# Patient Record
Sex: Male | Born: 1950 | Race: Black or African American | Hispanic: No | Smoking: Current every day smoker
Health system: Southern US, Community
[De-identification: ages and names within clinical notes are randomized; demographics above are authoritative.]

## PROBLEM LIST (undated history)

## (undated) DIAGNOSIS — F1010 Alcohol abuse, uncomplicated: Secondary | ICD-10-CM

## (undated) DIAGNOSIS — I10 Essential (primary) hypertension: Secondary | ICD-10-CM

## (undated) DIAGNOSIS — Z9119 Patient's noncompliance with other medical treatment and regimen: Secondary | ICD-10-CM

## (undated) DIAGNOSIS — K922 Gastrointestinal hemorrhage, unspecified: Secondary | ICD-10-CM

## (undated) DIAGNOSIS — D649 Anemia, unspecified: Secondary | ICD-10-CM

## (undated) DIAGNOSIS — E119 Type 2 diabetes mellitus without complications: Secondary | ICD-10-CM

## (undated) DIAGNOSIS — Z91199 Patient's noncompliance with other medical treatment and regimen due to unspecified reason: Secondary | ICD-10-CM

## (undated) DIAGNOSIS — R7302 Impaired glucose tolerance (oral): Secondary | ICD-10-CM

## (undated) DIAGNOSIS — K3189 Other diseases of stomach and duodenum: Secondary | ICD-10-CM

## (undated) DIAGNOSIS — E785 Hyperlipidemia, unspecified: Secondary | ICD-10-CM

## (undated) DIAGNOSIS — K269 Duodenal ulcer, unspecified as acute or chronic, without hemorrhage or perforation: Secondary | ICD-10-CM

## (undated) HISTORY — PX: HERNIA REPAIR: SHX51

## (undated) HISTORY — PX: CARDIAC CATHETERIZATION: SHX172

---

## 2006-07-17 ENCOUNTER — Inpatient Hospital Stay (HOSPITAL_COMMUNITY): Admission: EM | Admit: 2006-07-17 | Discharge: 2006-07-21 | Payer: Self-pay | Admitting: Emergency Medicine

## 2008-06-14 ENCOUNTER — Emergency Department (HOSPITAL_COMMUNITY): Admission: EM | Admit: 2008-06-14 | Discharge: 2008-06-14 | Payer: Self-pay | Admitting: Emergency Medicine

## 2010-05-29 LAB — URINALYSIS, ROUTINE W REFLEX MICROSCOPIC
Hgb urine dipstick: NEGATIVE
Leukocytes, UA: NEGATIVE
Protein, ur: 30 mg/dL — AB
Urobilinogen, UA: 0.2 mg/dL (ref 0.0–1.0)

## 2010-05-29 LAB — URINE MICROSCOPIC-ADD ON

## 2010-07-02 NOTE — Discharge Summary (Signed)
NAME:  Darren Howard, Darren Howard NO.:  [id number]   MEDICAL RECORD NO.:  [id number]          PATIENT TYPE:  INP   LOCATION:  2006                         FACILITY:  [facility]   PHYSICIAN:  Darren Howard, M.D.DATE OF BIRTH:  06/16/1950   DATE OF ADMISSION:  07/17/2006  DATE OF DISCHARGE:  07/21/2006                               DISCHARGE SUMMARY   DISCHARGE DIAGNOSES:  1. Hypertensive emergency, resolved and controlled.  2. Chest tightness.      a.     Negative myocardial infarction.  Patent coronary arteries       with normal ejection fraction 60-70%.  3. Diabetes mellitus type 2, new diagnosis.  4. Tobacco abuse, to stop smoking.  5. Hypokalemia, resolved.  6. Hyperlipidemia.   DISCHARGE CONDITION:  Improved.   PROCEDURES:  Combined left heart cath by Dr. Bryson Howard, July 20, 2006, revealing normal cores, LV is normal, renal arteries were normal.   DISCHARGE MEDICATIONS:  1. Norvasc 10 mg daily.  2. Aspirin 81 mg daily.  3. Lasix 40 mg daily.  4. Metoprolol XL, i.e. Toprol, 100 mg daily.  5. Zocor 40 mg every evening.  6. Chantix 0.5 mg 1 tab twice a day for 3 days, then 2 twice a day.      Patient may not feel he prefers to stop suddenly.  7. K-Dur 10 mEq daily.  8. Do Accu-Checks before meals and at bedtime.  Keep record for Dr.      Caron Howard.  9. 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   DISCHARGE INSTRUCTIONS:  1. Increase activity slowly.  May walk up steps once or twice a day      for 2 days.  2. May shower, bath.  3. No lifting for 2 days.  4. No driving for 2 days.  5. A low-sodium, heart-healthy diabetic diet.  6. Wash right groin cath site with soap and water.  Call if any      bleeding, swelling or drainage.  7. Follow up with Dr. Melvern Howard in [place] in 2 weeks.  Office will      call with date and time.  8. See Dr. Caron Howard this week, versus next, for diabetes management.  Take with you a log of all of your Accu-Checks.   HISTORY OF PRESENT ILLNESS:  [age]-year-old African American male  who presented to the emergency room for evaluation secondary to  dizziness, weakness, mild headache, chest tightness, chest pressure and  abnormal EKG.  He initially had problems on Jul 16, 2006, at work of  dizziness more pronounced than usual, also some mild headaches.  Blood  pressure was checked, at work, it was 204/100.  The morning of  admission, he felt weak, swimmy headed and decided to go to the doctor's  office.  Blood pressure, at Dr. Caron Howard, was 210/120.  EKG revealed  abnormal T wave inversions in his anterolateral leads.  Because of this,  patient was sent to the emergency room at [facility] for cardiac  evaluation.   PAST MEDICAL HISTORY:  Denies  any medical history, except surgical  repair of right inguinal hernia.   FAMILY HISTORY:  Significant for coronary disease.   SOCIAL HISTORY:  Smokes and drinks alcohol daily, even more so on the  weekends.  He lives with his wife, 4 children and 1 grandchild.   ALLERGIES:  NO KNOWN ALLERGIES.   OUTPATIENT MEDS:  None.   REVIEW OF SYSTEMS:  See H&P.   PHYSICAL EXAMINATION AT DISCHARGE:  VITAL SIGNS:  Blood pressure 142/89-  133/86.  Pulse 66.  Respiratory rate is 18.  Temperature 96.8.  Oxygen  saturation, on room air, 99%.  HEART:  Regular rate and rhythm.  S1, S2.  LUNGS:  Clear.  ABDOMEN:  Positive bowel sounds.  Right groin cath site was stable.  No  hematoma.  LOWER EXTREMITIES:  No edema of his lower extremities and pedal pulses  are present.  NEURO:  Alert and oriented.   LABORATORY DATA:  Initial hemoglobin 13.9, hematocrit 41.1, WBC was low  at 2.6, platelets 194.  At time of discharge, hemoglobin 14.2,  hematocrit 42.4, WBC is normal at 4.3 and platelets are 203.  Neutrophils, on admission, were 49, lymph 35, mono 12, EO 3, base at 1.   Coags:  PT 13.3, INR of 1.0, PTT 42 and on  heparin he was therapeutic.   Chemistries:  Sodium 139, potassium 3.8, chloride 106, CO2 27, glucose  113, BUN 13, creatinine 0.93, glucose remained elevation throughout  hospitalization.  Total protein 6.4, albumin 3.4, AST 29, ALT 34, ALP  52, total bili 0.6, mag level was 1.9-2.2.  Glycohemoglobin was 6.2.   Cardiac enzymes:  CK 207, 216, 156.  MBs were negative 3.3-2.3.  Troponin-I 0.01-0.02.   Cholesterol 181, triglycerides 138, HDL 38 and LDL 115.   PSA was 2.18.  TSH ranged 0.780-1.973.  UA was clear.  Chest x-ray  suggests a mild pulmonary vascular congestion.  No frank heart failure.  Low lung volumes.   HOSPITAL COURSE:  Darren Howard was admitted Jul 17, 2006 secondary to  hypertensive emergency with blood pressure 200/120, was sent to [place] from  Dr. Caron Howard.  Three years prior to this was his last eval and his blood  pressure, at that time, was normal.  Patient was seen and examined by  Darren Howard and initially started on Norvasc, labetalol, Lasix and due to  his family history, which his father died at 69 with an MI, also his son  who is 12 has diabetes, and the patient himself is now diabetic with a  glycohemoglobin of 6.2, it was felt cardiac cath would be the best  choice of evaluation.  Cardiac cath was found, fortunately, to have  normal cores, normal EF, just needed to be monitored for his  hypertension.  He was instructed on tobacco cessation, to decrease his  alcohol intake and to eat a health diet, diabetic diet and we also  instructed him on Accu-Checks.   Prior to his discharge, he had 4 beats of nonsustained ventricular  tachycardia with normal cords and normal LV.  This was felt secondary to  subendocardial ischemia from his hypertension and his medications were  adjusted.   Patient will follow up with Dr. Melvern Howard in [place], as well as Dr. Caron Howard for his diabetes management.  We will have him do Accu-Checks  prior to seeing Dr. Caron Howard.  Hopefully  with diet and exercise, we may  actually control without adding medication, but I will leave that to Dr.  Caron Howard.  Otilio Carpen. Dorene Ar, N.P.    ______________________________  Darren Howard, M.D.    LRI/MEDQ  D:  07/21/2006  T:  07/21/2006  Job:  [id number]   cc:   Bonne Dolores, M.D.

## 2010-07-02 NOTE — Cardiovascular Report (Signed)
NAME:  Darren Howard, Darren Howard NO.:  [id number]   MEDICAL RECORD NO.:  [id number]          PATIENT TYPE:  INP   LOCATION:  2006                         FACILITY:  [facility]   PHYSICIAN:  Bryson Dames, M.D.DATE OF BIRTH:  1950/10/05   DATE OF PROCEDURE:  07/20/2006  DATE OF DISCHARGE:                            CARDIAC CATHETERIZATION   PROCEDURES PERFORMED:  1. Selective coronary angiography by Judkins technique.  2. Retrograde left heart catheterization.  3. Left ventricular angiography.  4. Suprarenal abdominal aortography with bifemoral runoff.   COMPLICATIONS:  None.   ENTRY SITE:  Right femoral.   DYE USED:  Omnipaque.   CATHETERS USED:  Six-French Cordis diagnostic catheters.   PATIENT PROFILE:  The patient is a very pleasant 60 year old gentleman  who was admitted to the hospital for a blood pressure of 210/100, with a  recent emergency room evaluation for dizziness and lightheadedness.  His  blood pressure actually went as high as 210/120.  Abnormal EKG changes  were noted including T-wave inversions in the anterolateral leads.  He  was hospitalized and had negative cardiac enzymes and now enters the  cath lab for further evaluation of his borderline diabetes,  hypertension, abnormal EKG, and positive family history of premature  cardiovascular death, i.e., his father died at age 44 with acute  myocardial infarction.   RESULTS:   PRESSURES:  Left ventricular pressure was [id number], end-diastolic pressure  40.  Central aortic pressure 205/100, mean of 142.  No aortic valve gradient by pullback technique.   ANGIOGRAPHIC RESULTS:  The patient's left main coronary artery was  normal as was his LAD, two diagonal branches, and circumflex with one  large obtuse marginal branch.  No lesions were seen throughout his  entire left coronary arterial system.   The right coronary artery was a codominant vessel with the circumflex.  It gave rise to both the  posterior descending and a posterolateral  branch.  There was a lot of tortuosity in the RCA but no stenoses.   LEFT VENTRICULAR ANGIOGRAPHY:  Showed vigorous contractility of all wall  segments.  There did appear to be concentric left ventricular  hypertrophy.  There was no mitral valve prolapse.  There was a trivial  amount of catheter-induced mitral regurgitation which was not clinically  significant.  The aortic root looked normal and the ejection fraction  estimate was 60-70%.   SUPRARENAL ABDOMINAL AORTOGRAPHY:  Showed bilateral smooth renal  arteries with no evidence of stenosis, a normal infrarenal abdominal  aorta, normal common iliacs, and internal and external iliacs looked  fairly good, although they were not completely well visualized.   FINAL IMPRESSIONS:  1. Severe hypertension with secondary effects including left      ventricular hypertrophy on EGG and on left ventricular angiogram.  2. Recently developed borderline diabetes.  3. Multiple other cardiac risk factors including family history.  4. Normal renal arteries.  5. Normal coronary arteries.  6. Normal left ventricular systolic function.   PLAN:  The patient will follow up with Dr. Bonne Dolores in [place],  and he will follow up in our [facility]  office in May.           ______________________________  Bryson Dames, M.D.     WHG/MEDQ  D:  07/20/2006  T:  07/20/2006  Job:  [id number]   cc:   Bonne Dolores, M.D.

## 2010-07-02 NOTE — Discharge Summary (Signed)
NAME:  Darren Howard, Darren Howard NO.:  [id number]   MEDICAL RECORD NO.:  [id number]          PATIENT TYPE:  INP   LOCATION:  2006                         FACILITY:  [facility]   PHYSICIAN:  York Grice, P.A. DATE OF BIRTH:  1950/09/04   DATE OF ADMISSION:  07/17/2006  DATE OF DISCHARGE:                               DISCHARGE SUMMARY   CHIEF COMPLAINT:  Dizziness, weakness, mild headache, chest tightness,  mild chest pressure, abnormal EKG.   HISTORY OF PRESENT ILLNESS:  This is a 60 year old African American  gentleman who has never been seen by a cardiologist before, who  presented now to the emergency room for evaluation of her referral of  Dr. Caron Presume.  He does not have any previous history of coronary artery  disease, and considers himself quite healthy, but last night at work he  experienced dizziness that was more pronounced than usual and also had  some mild headache.  Blood pressure was checked at work, and it was  204/100.  This morning, the patient felt weak, swimmy-headed, and  decided to go to the doctor's office to have an evaluation.  Blood  pressure in the physician's office remained elevated to 210/120, and his  EKG revealed abnormal changes, T-wave inversion in anterolateral leads.  Based on these findings, the patient was sent to the emergency room of  [facility] for cardiology evaluation.   PAST MEDICAL HISTORY:  He denied any illnesses except surgical repair of  right inguinal hernia.   FAMILY HISTORY:  Significant for coronary artery disease.   SOCIAL HISTORY:  He continues to smoke daily, and drinks alcohol,  especially on the weekends.  He lives with his wife, has 4 children, and  1 grandchild.   ALLERGIES:  No known allergies.   MEDICATIONS:  None.   REVIEW OF SYSTEMS:  The patient denied any night sweats, chills, or any  changes in his weight recently.  He complains of mild headache.  There  is no dyspnea, no real chest pain,  but he admits to having mild chest  pressure.  He also feels occasional swimmy-headed, and when it happens,  he has some visual changes, but they are very short-lived.  He denied  any palpitations, syncope, presyncope.  There is no lower extremity  edema or claudication.  No indigestion, nausea, vomiting, no hematuria  or dysuria.  All other review of systems is negative.   PHYSICAL EXAMINATION:  GENERAL:  Well-developed, well-nourished African  American gentleman in no acute distress.  VITAL SIGNS:  Blood pressure 210/100.  Heart rate 63.  Temperature 98.1.  Respirations 18.  O2 saturation 100% on room air.  HEENT:  Normocephalic, atraumatic.  Extraocular movements are intact.  Sclerae anicteric.  NECK:  Without any JVD or carotid bruit.  Supple.  No lymphadenopathy or  thyromegaly appreciated.  LUNGS:  Clear to auscultation bilaterally.  No adventitious sounds.  HEART:  Regular rate and rhythm.  There is a 1 to 2/6 systolic ejection  murmur, which could be heard along the left sternal border as well.  ABDOMEN:  Soft, nontender, nondistended, with positive  bowel sounds x4.  No organomegaly or masses appreciated.  EXTREMITIES:  No edema.  Intact peripheral pulses.   EKG reveals sinus rhythm, T-wave inversions in leads I, aVL, V5 and V6.  His laboratory data showed leukopenia.  White blood cell count 2.6,  hemoglobin 13.9, hematocrit 41.1, and platelet count 194.  Sodium 139,  potassium 3.8, chloride 106, CO2 27, BUN 13, creatinine 0.93, glucose  119.  His AST was 29, ALT 34, CK-MB 1.3, troponin 0.05, and myoglobin  74.8.   IMPRESSION:  1. Abnormal electrocardiogram.  2. Hypertensive urgency.  3. Unknown lipid profile.  4. Family history of coronary artery disease.  5. Ongoing tobacco use.   PLAN:  The patient is going to be admitted to the telemetry unit.  We  are going to cycle his cardiac enzymes, we will start him on  nitroglycerin for blood pressure control, and also will  cycle his  enzymes.  If enzymes are negative, he might need outpatient stress test  for evaluation of underlying ischemia.  We will also check his lipid  profile and request smoking cessation consultation.      York Grice, P.A.     MK/MEDQ  D:  07/17/2006  T:  07/17/2006  Job:  [id number]   cc:   __________

## 2010-12-05 LAB — BASIC METABOLIC PANEL
BUN: 14
BUN: 19
CO2: 25
CO2: 28
CO2: 30
Calcium: 8.7
Calcium: 9.1
Calcium: 9.2
Chloride: 103
Chloride: 105
Creatinine, Ser: 1.19
GFR calc non Af Amer: >60
Glucose, Bld: 121 — ABNORMAL HIGH
Glucose, Bld: 125 — ABNORMAL HIGH
Glucose, Bld: 127 — ABNORMAL HIGH
Sodium: 140

## 2010-12-05 LAB — LIPID PANEL
Cholesterol: 183
HDL: 39 — ABNORMAL LOW
LDL Cholesterol: 127 — ABNORMAL HIGH
Triglycerides: 87

## 2010-12-05 LAB — CBC
MCHC: 33
MCHC: 33.4
MCV: 88.2
Platelets: 203
Platelets: 226
RDW: 13.7
RDW: 13.7
WBC: 3.2 — ABNORMAL LOW

## 2010-12-05 LAB — PROTIME-INR: Prothrombin Time: 13.3

## 2010-12-05 LAB — TSH: TSH: 1.973

## 2014-02-12 ENCOUNTER — Observation Stay (HOSPITAL_COMMUNITY)
Admission: EM | Admit: 2014-02-12 | Discharge: 2014-02-16 | Disposition: A | Discharge status: Home / Self Care | Payer: BLUE CROSS/BLUE SHIELD | Attending: Emergency Medicine | Admitting: Emergency Medicine

## 2014-02-12 ENCOUNTER — Encounter (HOSPITAL_COMMUNITY): Payer: Self-pay

## 2014-02-12 DIAGNOSIS — I10 Essential (primary) hypertension: Secondary | ICD-10-CM | POA: Diagnosis not present

## 2014-02-12 DIAGNOSIS — E119 Type 2 diabetes mellitus without complications: Secondary | ICD-10-CM

## 2014-02-12 DIAGNOSIS — D649 Anemia, unspecified: Secondary | ICD-10-CM

## 2014-02-12 DIAGNOSIS — Z79899 Other long term (current) drug therapy: Secondary | ICD-10-CM | POA: Diagnosis not present

## 2014-02-12 DIAGNOSIS — I160 Hypertensive urgency: Secondary | ICD-10-CM | POA: Insufficient documentation

## 2014-02-12 DIAGNOSIS — E1165 Type 2 diabetes mellitus with hyperglycemia: Secondary | ICD-10-CM

## 2014-02-12 DIAGNOSIS — R0989 Other specified symptoms and signs involving the circulatory and respiratory systems: Secondary | ICD-10-CM | POA: Diagnosis not present

## 2014-02-12 DIAGNOSIS — Z720 Tobacco use: Secondary | ICD-10-CM | POA: Diagnosis not present

## 2014-02-12 DIAGNOSIS — R000 Tachycardia, unspecified: Secondary | ICD-10-CM

## 2014-02-12 DIAGNOSIS — R040 Epistaxis: Principal | ICD-10-CM | POA: Insufficient documentation

## 2014-02-12 HISTORY — DX: Impaired glucose tolerance (oral): R73.02

## 2014-02-12 HISTORY — DX: Essential (primary) hypertension: I10

## 2014-02-12 MED ORDER — PHENYLEPHRINE HCL 1 % NA SOLN
NASAL | Status: AC
  Administered 2014-02-13: 03:00:00
  Filled 2014-02-12: qty 15

## 2014-02-12 MED ORDER — PHENYLEPHRINE HCL 1 % NA SOLN
NASAL | Status: AC
  Filled 2014-02-12: qty 15

## 2014-02-12 MED ORDER — CLONIDINE HCL 0.2 MG PO TABS
0.2000 mg | ORAL_TABLET | Freq: Once | ORAL | Status: AC
  Administered 2014-02-12: 0.2 mg via ORAL

## 2014-02-12 MED ORDER — CLONIDINE HCL 0.2 MG PO TABS
ORAL_TABLET | ORAL | Status: AC
  Administered 2014-02-12: 0.2 mg via ORAL
  Filled 2014-02-12: qty 1

## 2014-02-12 NOTE — ED Provider Notes (Signed)
CSN: [id number]     Arrival date & time 02/12/14  [id number] History   This chart was scribed for Veryl Speak, MD by Randa Evens, ED Scribe. This patient was seen in room APA07/APA07 and the patient's care was started at 9:49 PM.    Chief Complaint  Patient presents with  . Epistaxis   Patient is a 63 y.o. male presenting with nosebleeds. The history is provided by the patient. No language interpreter was used.  Epistaxis Location:  L nare Severity:  Mild Duration:  2 hours Progression:  Improving Chronicity:  New Context: hypertension   Context: not trauma   Worsened by:  Nothing tried Associated symptoms: congestion    HPI Comments: Darren Howard is a 63 y.o. male with PMHX of HTN who presents to the Emergency Department complaining of left nare epistaxis onset 1 hour PTA. Pt states that he has been blowing his nose more than often due to being congested. Pt presents with napkins in his nose and is unsure if the bleeding is controlled. Pt denies any trauma or injury to the nose. Denies taking any blood thinners.   Past Medical History  Diagnosis Date  . Hypertension    History reviewed. No pertinent past surgical history. History reviewed. No pertinent family history. History  Substance Use Topics  . Smoking status: Current Every Day Smoker -- 0.50 packs/day  . Smokeless tobacco: Not on file  . Alcohol Use: Yes     Comment: 5th per week     Review of Systems  HENT: Positive for congestion and nosebleeds.   All other systems reviewed and are negative.     Allergies  Review of patient's allergies indicates no known allergies.  Home Medications   Prior to Admission medications   Medication Sig Start Date End Date Taking? Authorizing Provider  acetaminophen (TYLENOL) 500 MG tablet Take 500 mg by mouth every 6 (six) hours as needed for mild pain or moderate pain.   Yes Historical Provider, MD   Triage Vitals: BP 189/102 mmHg  Pulse 111  Temp(Src) 99 F (37.2 C)  (Oral)  Resp 20  Ht 6\' 2"  (1.88 m)  Wt 231 lb 12.8 oz (105.144 kg)  BMI 29.75 kg/m2  SpO2 100%  Physical Exam  Constitutional: He is oriented to person, place, and time. He appears well-developed and well-nourished. No distress.  HENT:  Head: Normocephalic and atraumatic.  Nose: Epistaxis is observed.  Bleeding from left nare, septum appears midline and intact.   Eyes: Conjunctivae and EOM are normal.  Neck: Neck supple. No tracheal deviation present.  Cardiovascular: Normal rate.   Pulmonary/Chest: Effort normal. No respiratory distress.  Musculoskeletal: Normal range of motion.  Neurological: He is alert and oriented to person, place, and time.  Skin: Skin is warm and dry.  Psychiatric: He has a normal mood and affect. His behavior is normal.  Nursing note and vitals reviewed.   ED Course  Procedures (including critical care time) DIAGNOSTIC STUDIES: Oxygen Saturation is 100% on RA, normal by my interpretation.    COORDINATION OF CARE: 9:54 PM-Discussed treatment plan with pt at bedside and pt agreed to plan.     Labs Review Labs Reviewed - No data to display  Imaging Review No results found.   EKG Interpretation   Date/Time:  Sunday February 12 2014 22:43:14 EST Ventricular Rate:  110 PR Interval:  155 QRS Duration: 89 QT Interval:  353 QTC Calculation: 477 R Axis:   -52 Text Interpretation:  Sinus tachycardia Left  anterior fascicular block  Abnormal R-wave progression, late transition Left ventricular hypertrophy  Abnormal T, consider ischemia, lateral leads Confirmed by DELOS  MD,  Lilliauna Van ([id number]) on 02/12/2014 11:19:08 PM      MDM   Final diagnoses:  None      Patient presents here with complaints of nosebleed. He denies any injury or trauma. I attempted to separate Rhino Rocket type packings, however each failed. I have now packed him with a Vaseline gauze and appears to be tolerating this better. While here, his blood pressure has been markedly  elevated, however EKG is not suggestive of any acute process. He was given clonidine and his blood pressure has responded. He has been off of all of his antihypertensive medications for many months. I will restart blood pressure medications and feel as though he is appropriate for discharge.  His laboratory studies are still pending. If these are unremarkable, the patient will be discharged. Care will be signed out to Dr. Regenia Skeeter at shift change. He will be to follow-up with ear nose and throat in 2 to 3 days for packing removal.   I personally performed the services described in this documentation, which was scribed in my presence. The recorded information has been reviewed and is accurate.       Veryl Speak, MD 02/13/14 [phone]

## 2014-02-12 NOTE — ED Notes (Addendum)
Patient c/o of nose bleed which started one hour ago. Denies any blood thinners. Denies injury. Pt admits to drinking half a pint of liquor today.SBP 189/102 Patient admits to not taking blood medications "in a while", "months".

## 2014-02-13 ENCOUNTER — Encounter (HOSPITAL_COMMUNITY): Payer: Self-pay | Admitting: Internal Medicine

## 2014-02-13 DIAGNOSIS — E119 Type 2 diabetes mellitus without complications: Secondary | ICD-10-CM

## 2014-02-13 DIAGNOSIS — I10 Essential (primary) hypertension: Secondary | ICD-10-CM

## 2014-02-13 DIAGNOSIS — R000 Tachycardia, unspecified: Secondary | ICD-10-CM

## 2014-02-13 DIAGNOSIS — I160 Hypertensive urgency: Secondary | ICD-10-CM | POA: Insufficient documentation

## 2014-02-13 DIAGNOSIS — R040 Epistaxis: Secondary | ICD-10-CM | POA: Diagnosis present

## 2014-02-13 DIAGNOSIS — D649 Anemia, unspecified: Secondary | ICD-10-CM

## 2014-02-13 HISTORY — DX: Type 2 diabetes mellitus without complications: E11.9

## 2014-02-13 LAB — COMPREHENSIVE METABOLIC PANEL
ALT: 28 U/L (ref 0–53)
ALT: 24 U/L (ref 0–53)
ANION GAP: 11 (ref 5–15)
AST: 23 U/L (ref 0–37)
AST: 17 U/L (ref 0–37)
Albumin: 3.4 g/dL — ABNORMAL LOW (ref 3.5–5.2)
Albumin: 3.1 g/dL — ABNORMAL LOW (ref 3.5–5.2)
Alkaline Phosphatase: 87 U/L (ref 39–117)
Alkaline Phosphatase: 79 U/L (ref 39–117)
Anion gap: 13 (ref 5–15)
BUN: 27 mg/dL — ABNORMAL HIGH (ref 6–23)
BUN: 25 mg/dL — ABNORMAL HIGH (ref 6–23)
CALCIUM: 8.9 mg/dL (ref 8.4–10.5)
CALCIUM: 8.5 mg/dL (ref 8.4–10.5)
CO2: 19 mmol/L (ref 19–32)
CO2: 20 mmol/L (ref 19–32)
CREATININE: 1.22 mg/dL (ref 0.50–1.35)
Chloride: 99 mEq/L (ref 96–112)
Chloride: 100 mEq/L (ref 96–112)
Creatinine, Ser: 1.04 mg/dL (ref 0.50–1.35)
GFR calc Af Amer: 88 mL/min — ABNORMAL LOW (ref >90)
GFR calc non Af Amer: 76 mL/min — ABNORMAL LOW (ref >90)
GFR, EST AFRICAN AMERICAN: 72 mL/min — AB (ref >90)
GFR, EST NON AFRICAN AMERICAN: 62 mL/min — AB (ref >90)
GLUCOSE: 394 mg/dL — AB (ref 70–99)
Glucose, Bld: 337 mg/dL — ABNORMAL HIGH (ref 70–99)
Potassium: 4.9 mmol/L (ref 3.5–5.1)
Potassium: 4.6 mmol/L (ref 3.5–5.1)
SODIUM: 131 mmol/L — AB (ref 135–145)
Sodium: 131 mmol/L — ABNORMAL LOW (ref 135–145)
TOTAL PROTEIN: 6.3 g/dL (ref 6.0–8.3)
Total Bilirubin: 0.8 mg/dL (ref 0.3–1.2)
Total Bilirubin: 0.9 mg/dL (ref 0.3–1.2)
Total Protein: 6.8 g/dL (ref 6.0–8.3)

## 2014-02-13 LAB — TROPONIN I
TROPONIN I: 0.04 ng/mL — AB (ref <0.031)
TROPONIN I: 0.04 ng/mL — AB (ref <0.031)
Troponin I: 0.06 ng/mL — ABNORMAL HIGH (ref <0.031)
Troponin I: 0.05 ng/mL — ABNORMAL HIGH (ref <0.031)

## 2014-02-13 LAB — CBC WITH DIFFERENTIAL/PLATELET
BASOS ABS: 0 10*3/uL (ref 0.0–0.1)
Basophils Relative: 1 % (ref 0–1)
EOS PCT: 3 % (ref 0–5)
Eosinophils Absolute: 0.2 10*3/uL (ref 0.0–0.7)
HEMATOCRIT: 38.6 % — AB (ref 39.0–52.0)
Hemoglobin: 12.8 g/dL — ABNORMAL LOW (ref 13.0–17.0)
LYMPHS PCT: 22 % (ref 12–46)
Lymphs Abs: 1.3 10*3/uL (ref 0.7–4.0)
MCH: 29.3 pg (ref 26.0–34.0)
MCHC: 33.2 g/dL (ref 30.0–36.0)
MCV: 88.3 fL (ref 78.0–100.0)
MONO ABS: 0.9 10*3/uL (ref 0.1–1.0)
Monocytes Relative: 16 % — ABNORMAL HIGH (ref 3–12)
Neutro Abs: 3.6 10*3/uL (ref 1.7–7.7)
Neutrophils Relative %: 60 % (ref 43–77)
Platelets: 235 10*3/uL (ref 150–400)
RBC: 4.37 MIL/uL (ref 4.22–5.81)
RDW: 13.7 % (ref 11.5–15.5)
WBC: 6.1 10*3/uL (ref 4.0–10.5)

## 2014-02-13 LAB — GLUCOSE, CAPILLARY
Glucose-Capillary: 273 mg/dL — ABNORMAL HIGH (ref 70–99)
Glucose-Capillary: 395 mg/dL — ABNORMAL HIGH (ref 70–99)
Glucose-Capillary: 388 mg/dL — ABNORMAL HIGH (ref 70–99)
Glucose-Capillary: 351 mg/dL — ABNORMAL HIGH (ref 70–99)

## 2014-02-13 LAB — CBC
HEMATOCRIT: 35 % — AB (ref 39.0–52.0)
HEMOGLOBIN: 11.6 g/dL — AB (ref 13.0–17.0)
MCH: 29.1 pg (ref 26.0–34.0)
MCHC: 33.1 g/dL (ref 30.0–36.0)
MCV: 87.7 fL (ref 78.0–100.0)
Platelets: 210 10*3/uL (ref 150–400)
RBC: 3.99 MIL/uL — ABNORMAL LOW (ref 4.22–5.81)
RDW: 13.7 % (ref 11.5–15.5)
WBC: 5.8 10*3/uL (ref 4.0–10.5)

## 2014-02-13 LAB — PROTIME-INR
INR: 1.1 (ref 0.00–1.49)
Prothrombin Time: 14.3 seconds (ref 11.6–15.2)

## 2014-02-13 LAB — TSH: TSH: 2.246 u[IU]/mL (ref 0.350–4.500)

## 2014-02-13 LAB — HEMOGLOBIN A1C
Hgb A1c MFr Bld: 14.9 % — ABNORMAL HIGH (ref <5.7)
Mean Plasma Glucose: 381 mg/dL — ABNORMAL HIGH (ref <117)

## 2014-02-13 LAB — APTT: APTT: 29 s (ref 24–37)

## 2014-02-13 MED ORDER — SODIUM CHLORIDE 0.9 % IJ SOLN: 3.0000 mL | INTRAMUSCULAR | Status: DC | PRN

## 2014-02-13 MED ORDER — INSULIN DETEMIR 100 UNIT/ML ~~LOC~~ SOLN
10.0000 [IU] | Freq: Every day | SUBCUTANEOUS | Status: DC
  Administered 2014-02-13 – 2014-02-14 (×2): 10 [IU] via SUBCUTANEOUS
  Filled 2014-02-13 (×3): qty 0.1

## 2014-02-13 MED ORDER — LOSARTAN POTASSIUM 50 MG PO TABS: 50.0000 mg | ORAL_TABLET | Freq: Every day | ORAL | Status: DC

## 2014-02-13 MED ORDER — LISINOPRIL 10 MG PO TABS: 10.0000 mg | ORAL_TABLET | Freq: Every day | ORAL | Status: DC

## 2014-02-13 MED ORDER — CLINDAMYCIN HCL 150 MG PO CAPS
300.0000 mg | ORAL_CAPSULE | Freq: Three times a day (TID) | ORAL | Status: DC
  Administered 2014-02-13 – 2014-02-14 (×4): 300 mg via ORAL
  Filled 2014-02-13 (×4): qty 2

## 2014-02-13 MED ORDER — LOSARTAN POTASSIUM 50 MG PO TABS
50.0000 mg | ORAL_TABLET | Freq: Every day | ORAL | Status: DC
  Administered 2014-02-13 – 2014-02-16 (×4): 50 mg via ORAL
  Filled 2014-02-13 (×4): qty 1

## 2014-02-13 MED ORDER — INSULIN ASPART 100 UNIT/ML ~~LOC~~ SOLN
0.0000 [IU] | Freq: Every day | SUBCUTANEOUS | Status: DC
  Administered 2014-02-13: 5 [IU] via SUBCUTANEOUS

## 2014-02-13 MED ORDER — SODIUM CHLORIDE 0.9 % IV SOLN: 250.0000 mL | INTRAVENOUS | Status: DC | PRN

## 2014-02-13 MED ORDER — SODIUM CHLORIDE 0.9 % IJ SOLN
3.0000 mL | Freq: Two times a day (BID) | INTRAMUSCULAR | Status: DC
  Administered 2014-02-13 – 2014-02-16 (×4): 3 mL via INTRAVENOUS

## 2014-02-13 MED ORDER — LIVING WELL WITH DIABETES BOOK
Freq: Once | Status: AC
  Administered 2014-02-13: 13:00:00
  Filled 2014-02-13: qty 1

## 2014-02-13 MED ORDER — HYDRALAZINE HCL 20 MG/ML IJ SOLN: 10.0000 mg | Freq: Four times a day (QID) | INTRAMUSCULAR | Status: DC | PRN

## 2014-02-13 MED ORDER — ACETAMINOPHEN 650 MG RE SUPP: 650.0000 mg | Freq: Four times a day (QID) | RECTAL | Status: DC | PRN

## 2014-02-13 MED ORDER — ACETAMINOPHEN 325 MG PO TABS
650.0000 mg | ORAL_TABLET | Freq: Four times a day (QID) | ORAL | Status: DC | PRN
  Administered 2014-02-13: 650 mg via ORAL
  Filled 2014-02-13: qty 2

## 2014-02-13 MED ORDER — AMLODIPINE BESYLATE 10 MG PO TABS: 10.0000 mg | ORAL_TABLET | Freq: Every day | ORAL | Status: DC

## 2014-02-13 MED ORDER — CARVEDILOL 12.5 MG PO TABS: 12.5000 mg | ORAL_TABLET | Freq: Two times a day (BID) | ORAL | Status: DC

## 2014-02-13 MED ORDER — SODIUM CHLORIDE 0.9 % IJ SOLN
3.0000 mL | Freq: Two times a day (BID) | INTRAMUSCULAR | Status: DC
  Administered 2014-02-13 (×2): 3 mL via INTRAVENOUS

## 2014-02-13 MED ORDER — INSULIN ASPART 100 UNIT/ML ~~LOC~~ SOLN
0.0000 [IU] | Freq: Three times a day (TID) | SUBCUTANEOUS | Status: DC
  Administered 2014-02-13: 5 [IU] via SUBCUTANEOUS
  Administered 2014-02-13 – 2014-02-14 (×3): 9 [IU] via SUBCUTANEOUS

## 2014-02-13 MED ORDER — CARVEDILOL 12.5 MG PO TABS
12.5000 mg | ORAL_TABLET | Freq: Two times a day (BID) | ORAL | Status: DC
  Administered 2014-02-13 – 2014-02-15 (×5): 12.5 mg via ORAL
  Filled 2014-02-13 (×5): qty 1

## 2014-02-13 NOTE — ED Provider Notes (Signed)
CSN: [id number]     Arrival date & time 02/12/14  1922 History   First MD Initiated Contact with Patient 02/12/14 2146     Chief Complaint  Patient presents with  . Epistaxis     (Consider location/radiation/quality/duration/timing/severity/associated sxs/prior Treatment) HPI  Past Medical History  Diagnosis Date  . Hypertension    History reviewed. No pertinent past surgical history. History reviewed. No pertinent family history. History  Substance Use Topics  . Smoking status: Current Every Day Smoker -- 0.50 packs/day  . Smokeless tobacco: Not on file  . Alcohol Use: Yes     Comment: 5th per week     Review of Systems    Allergies  Review of patient's allergies indicates no known allergies.  Home Medications   Prior to Admission medications   Medication Sig Start Date End Date Taking? Authorizing Provider  acetaminophen (TYLENOL) 500 MG tablet Take 500 mg by mouth every 6 (six) hours as needed for mild pain or moderate pain.   Yes Historical Provider, MD  amLODipine (NORVASC) 10 MG tablet Take 1 tablet (10 mg total) by mouth daily. 02/13/14   Veryl Speak, MD  lisinopril (PRINIVIL,ZESTRIL) 10 MG tablet Take 1 tablet (10 mg total) by mouth daily. 02/13/14   Veryl Speak, MD   BP 188/99 mmHg  Pulse 103  Temp(Src) 99 F (37.2 C) (Oral)  Resp 15  Ht 6\' 2"  (1.88 m)  Wt 231 lb 12.8 oz (105.144 kg)  BMI 29.75 kg/m2  SpO2 99% Physical Exam  ED Course  EPISTAXIS MANAGEMENT Date/Time: 02/13/2014 1:24 AM Performed by: Ephraim Hamburger Authorized by: Sherwood Gambler T Consent: Verbal consent obtained. Risks and benefits: risks, benefits and alternatives were discussed Consent given by: patient Treatment site: left anterior Repair method: anterior pack Post-procedure assessment: bleeding decreased Treatment complexity: simple Patient tolerance: Patient tolerated the procedure well with no immediate complications   (including critical care time) Labs  Review Labs Reviewed  CBC WITH DIFFERENTIAL - Abnormal; Notable for the following:    Hemoglobin 12.8 (*)    HCT 38.6 (*)    Monocytes Relative 16 (*)    All other components within normal limits  COMPREHENSIVE METABOLIC PANEL - Abnormal; Notable for the following:    Sodium 131 (*)    Glucose, Bld 394 (*)    BUN 27 (*)    Albumin 3.4 (*)    GFR calc non Af Amer 62 (*)    GFR calc Af Amer 72 (*)    All other components within normal limits  TROPONIN I - Abnormal; Notable for the following:    Troponin I 0.04 (*)    All other components within normal limits    Imaging Review No results found.   EKG Interpretation   Date/Time:  Sunday February 12 2014 22:43:14 EST Ventricular Rate:  110 PR Interval:  155 QRS Duration: 89 QT Interval:  353 QTC Calculation: 477 R Axis:   -52 Text Interpretation:  Sinus tachycardia Left anterior fascicular block  Abnormal R-wave progression, late transition Left ventricular hypertrophy  Abnormal T, consider ischemia, lateral leads Confirmed by DELOS  MD,  DOUGLAS ([id number]) on 02/12/2014 11:19:08 PM      MDM   Final diagnoses:  Epistaxis  Hypertensive urgency    Patient care transferred to me. Patient had bleeding around the packing by Dr. Stark Jock, and I removed this and placed a rhino rocket. Bleeding controlled. HTN downtrending, but initial troponin slightly elevated. No CP. T wave inversions in I, AVL.  Consulted hospitalist who will observe and trend enzymes.    Ephraim Hamburger, MD 02/13/14 [phone]

## 2014-02-13 NOTE — Progress Notes (Signed)
UR completed 

## 2014-02-13 NOTE — Progress Notes (Signed)
Inpatient Diabetes Program Recommendations  AACE/ADA: New Consensus Statement on Inpatient Glycemic Control (2013)  Target Ranges:  Prepandial:   less than 140 mg/dL      Peak postprandial:   less than 180 mg/dL (1-2 hours)      Critically ill patients:  140 - 180 mg/dL   Results for CORRIGAN, WANEK (MRN [id number]) as of 02/13/2014 09:24  Ref. Range 02/12/2014 22:30 02/13/2014 05:34  Glucose Latest Range: 70-99 mg/dL 394 (H) 337 (H)   Diabetes history: Glucose Intolerance Outpatient Diabetes medications: No Current orders for Inpatient glycemic control: Novolog 0-9 units AC, Novolog 0-5 units HS  Inpatient Diabetes Program Recommendations Insulin - Basal: Please consider starting basal insulin; recommend ordering Levemir 10 units daily (to start now). Correction (SSI): Please consider increasing Novolog correction to moderate scale. HgbA1C: Noted A1C is in process.  Thanks, Barnie Alderman, RN, MSN, CCRN, CDE Diabetes Coordinator Inpatient Diabetes Program [phone] (Team Pager) [phone] (AP office) [phone] [facility] office)

## 2014-02-13 NOTE — Progress Notes (Signed)
3:10 PM I agree with HPI/GPe and A/P per Dr. Maudie Mercury  Pleasant noncompliant 63 year old male not taking any blood pressure medications, prior history cardiac cath with clean coronary arteries 2002 admitted with headache and epistaxis and  And bright red blood per rectum-slightly positive troponin 0.04 and admitted. He is not taking medications for the past couple of months works outside in the cold and feels that he was coming down with cold. She is also his blood pressure is high and this may cause him to have headaches and then the epistaxis.  He is currently doing better does not have any headaches at this time Epistaxis seems to have cleared       HEENT EOMI soft nontender nondistended no rebound NCAT CHEST clear no added sound CARDIAC S1-S2 no murmur rub or gallop ABDOMEN soft  nt/nd   Patient Active Problem List   Diagnosis Date Noted  . Epistaxis 02/13/2014  . Hypertension 02/13/2014  . Anemia 02/13/2014  . DM2 (diabetes mellitus, type 2) 02/13/2014  . Tachycardia 02/13/2014  . Hypertensive urgency    He will continue present medications ofCoreg twice a day 12.5, clonidine 0.2 daily and hydralazine every 6 when necessary for blood sugars above 0000000 systolic as well as losartan 50 daily. Blood sugar on admission 300 range, continue sensitive scale with at at bedtime coverage and 10 units Levemir. Prior normal cardiac cath  Telemetry, observation Likely discharge in a.m. If all stable  Verneita Griffes, MD [facility] [phone]

## 2014-02-13 NOTE — H&P (Signed)
Darren Howard is an 63 y.o. male.    Dr. Armandina Gemma ([place]), pcp  Chief Complaint: epistaxis HPI: 63 yo male with hypertension, not on medication c/o epistaxis beginning this am.  Pt was noted to have elevated bp, and tachycardic in the am,  Pt also noted to have bs 394.  Denies cp, palp, sob, n/v, diarrhea, black stool,  Brbpr, (pt notes has had in the past, and told he had hemorrhoids).   Pt was noted to have slightly positive trop, and will be admitted for hypertensive urgency.    Past Medical History  Diagnosis Date  . Hypertension   . Glucose intolerance (impaired glucose tolerance)     Past Surgical History  Procedure Laterality Date  . Cardiac catheterization    . Hernia repair      Family History  Problem Relation Age of Onset  . Congestive Heart Failure Mother   . Congestive Heart Failure Father    Social History:  reports that he has been smoking.  He does not have any smokeless tobacco history on file. He reports that he drinks alcohol. He reports that he does not use illicit drugs.  Allergies: No Known Allergies   (Not in a hospital admission)  Results for orders placed or performed during the hospital encounter of 02/12/14 (from the past 48 hour(s))  CBC with Differential     Status: Abnormal   Collection Time: 02/12/14 10:30 PM  Result Value Ref Range   WBC 6.1 4.0 - 10.5 K/uL   RBC 4.37 4.22 - 5.81 MIL/uL   Hemoglobin 12.8 (L) 13.0 - 17.0 g/dL   HCT 38.6 (L) 39.0 - 52.0 %   MCV 88.3 78.0 - 100.0 fL   MCH 29.3 26.0 - 34.0 pg   MCHC 33.2 30.0 - 36.0 g/dL   RDW 13.7 11.5 - 15.5 %   Platelets 235 150 - 400 K/uL   Neutrophils Relative % 60 43 - 77 %   Neutro Abs 3.6 1.7 - 7.7 K/uL   Lymphocytes Relative 22 12 - 46 %   Lymphs Abs 1.3 0.7 - 4.0 K/uL   Monocytes Relative 16 (H) 3 - 12 %   Monocytes Absolute 0.9 0.1 - 1.0 K/uL   Eosinophils Relative 3 0 - 5 %   Eosinophils Absolute 0.2 0.0 - 0.7 K/uL   Basophils Relative 1 0 - 1 %   Basophils Absolute  0.0 0.0 - 0.1 K/uL  Comprehensive metabolic panel     Status: Abnormal   Collection Time: 02/12/14 10:30 PM  Result Value Ref Range   Sodium 131 (L) 135 - 145 mmol/L    Comment: Please note change in reference range.   Potassium 4.9 3.5 - 5.1 mmol/L    Comment: Please note change in reference range.   Chloride 99 96 - 112 mEq/L   CO2 19 19 - 32 mmol/L   Glucose, Bld 394 (H) 70 - 99 mg/dL   BUN 27 (H) 6 - 23 mg/dL   Creatinine, Ser 1.22 0.50 - 1.35 mg/dL   Calcium 8.9 8.4 - 10.5 mg/dL   Total Protein 6.8 6.0 - 8.3 g/dL   Albumin 3.4 (L) 3.5 - 5.2 g/dL   AST 23 0 - 37 U/L   ALT 28 0 - 53 U/L   Alkaline Phosphatase 87 39 - 117 U/L   Total Bilirubin 0.8 0.3 - 1.2 mg/dL   GFR calc non Af Amer 62 (L) >90 mL/min   GFR calc Af Amer 72 (L) >  90 mL/min    Comment: (NOTE) The eGFR has been calculated using the CKD EPI equation. This calculation has not been validated in all clinical situations. eGFR's persistently <90 mL/min signify possible Chronic Kidney Disease.    Anion gap 13 5 - 15  Troponin I     Status: Abnormal   Collection Time: 02/12/14 10:30 PM  Result Value Ref Range   Troponin I 0.04 (H) <0.031 ng/mL    Comment:        PERSISTENTLY INCREASED TROPONIN VALUES IN THE RANGE OF 0.04-0.49 ng/mL CAN BE SEEN IN:       -UNSTABLE ANGINA       -CONGESTIVE HEART FAILURE       -MYOCARDITIS       -CHEST TRAUMA       -ARRYHTHMIAS       -LATE PRESENTING MYOCARDIAL INFARCTION       -COPD   CLINICAL FOLLOW-UP RECOMMENDED. Please note change in reference range.    No results found.  Review of Systems  Constitutional: Negative.   HENT: Negative.   Eyes: Negative.   Respiratory: Negative.   Cardiovascular: Negative.   Gastrointestinal: Negative.   Genitourinary: Negative.   Musculoskeletal: Negative.   Skin: Negative.   Neurological: Negative.   Endo/Heme/Allergies: Negative.   Psychiatric/Behavioral: Negative.     Blood pressure 188/99, pulse 103, temperature 99 F  (37.2 C), temperature source Oral, resp. rate 15, height 6' 2"  (1.88 m), weight 105.144 kg (231 lb 12.8 oz), SpO2 99 %. Physical Exam  Constitutional: He is oriented to person, place, and time. He appears well-developed and well-nourished.  HENT:  Head: Normocephalic and atraumatic.  Eyes: Conjunctivae and EOM are normal. Pupils are equal, round, and reactive to light.  Neck: Normal range of motion. Neck supple. No JVD present. No tracheal deviation present. No thyromegaly present.  Cardiovascular: Normal rate and regular rhythm.  Exam reveals no gallop and no friction rub.   No murmur heard. Respiratory: Effort normal and breath sounds normal. No stridor. No respiratory distress. He has no wheezes. He has no rales.  GI: Soft. Bowel sounds are normal. He exhibits no distension. There is no tenderness. There is no rebound and no guarding.  Musculoskeletal: Normal range of motion. He exhibits no edema or tenderness.  Lymphadenopathy:    He has no cervical adenopathy.  Neurological: He is alert and oriented to person, place, and time. He has normal reflexes. He displays normal reflexes. No cranial nerve deficit. He exhibits normal muscle tone. Coordination normal.  Skin: Skin is warm and dry. No rash noted. No erythema. No pallor.  Psychiatric: He has a normal mood and affect. His behavior is normal. Judgment and thought content normal.     Assessment/Plan Epistaxis, resolved,  Check cbc in am, check pt, ptt,  Hypertensive emergency Start on carvedilol 12.2m po bid Start on losartan 543mpo qday,  Hydralazine 1068mv q6h prn sbp >160 Consider further w/up  Tachycardia Trop q6h x3, tsh, echo  Dm2 Check fsbs ac and qhs, iss Check hga1c  Anemia Repeat cbc in am Consider w/up as outpatient w colonoscpy  KIMJani Gravel/28/2015, 2:22 AM

## 2014-02-13 NOTE — Discharge Instructions (Signed)
Norvasc and lisinopril as prescribed.  You need to have your blood pressures followed up by primary care physician.  Follow-up with ear nose and throat for packing removal in 2-3 days. The contact information for [facility] has been provided in this discharge summary. Call to arrange an appointment in the next 2-3 days.  Return to the ER if your symptoms substantially worsen or change.   Hypertension Hypertension, commonly called high blood pressure, is when the force of blood pumping through your arteries is too strong. Your arteries are the blood vessels that carry blood from your heart throughout your body. A blood pressure reading consists of a higher number over a lower number, such as 110/72. The higher number (systolic) is the pressure inside your arteries when your heart pumps. The lower number (diastolic) is the pressure inside your arteries when your heart relaxes. Ideally you want your blood pressure below 120/80. Hypertension forces your heart to work harder to pump blood. Your arteries may become narrow or stiff. Having hypertension puts you at risk for heart disease, stroke, and other problems.  RISK FACTORS Some risk factors for high blood pressure are controllable. Others are not.  Risk factors you cannot control include:   Race. You may be at higher risk if you are African American.  Age. Risk increases with age.  Gender. Men are at higher risk than women before age 57 years. After age 36, women are at higher risk than men. Risk factors you can control include:  Not getting enough exercise or physical activity.  Being overweight.  Getting too much fat, sugar, calories, or salt in your diet.  Drinking too much alcohol. SIGNS AND SYMPTOMS Hypertension does not usually cause signs or symptoms. Extremely high blood pressure (hypertensive crisis) may cause headache, anxiety, shortness of breath, and nosebleed. DIAGNOSIS  To check if you have hypertension, your health  care provider will measure your blood pressure while you are seated, with your arm held at the level of your heart. It should be measured at least twice using the same arm. Certain conditions can cause a difference in blood pressure between your right and left arms. A blood pressure reading that is higher than normal on one occasion does not mean that you need treatment. If one blood pressure reading is high, ask your health care provider about having it checked again. TREATMENT  Treating high blood pressure includes making lifestyle changes and possibly taking medicine. Living a healthy lifestyle can help lower high blood pressure. You may need to change some of your habits. Lifestyle changes may include:  Following the DASH diet. This diet is high in fruits, vegetables, and whole grains. It is low in salt, red meat, and added sugars.  Getting at least 2 hours of brisk physical activity every week.  Losing weight if necessary.  Not smoking.  Limiting alcoholic beverages.  Learning ways to reduce stress. If lifestyle changes are not enough to get your blood pressure under control, your health care provider may prescribe medicine. You may need to take more than one. Work closely with your health care provider to understand the risks and benefits. HOME CARE INSTRUCTIONS  Have your blood pressure rechecked as directed by your health care provider.   Take medicines only as directed by your health care provider. Follow the directions carefully. Blood pressure medicines must be taken as prescribed. The medicine does not work as well when you skip doses. Skipping doses also puts you at risk for problems.  Do not smoke.   Monitor your blood pressure at home as directed by your health care provider. SEEK MEDICAL CARE IF:   You think you are having a reaction to medicines taken.  You have recurrent headaches or feel dizzy.  You have swelling in your ankles.  You have trouble with your  vision. SEEK IMMEDIATE MEDICAL CARE IF:  You develop a severe headache or confusion.  You have unusual weakness, numbness, or feel faint.  You have severe chest or abdominal pain.  You vomit repeatedly.  You have trouble breathing. MAKE SURE YOU:   Understand these instructions.  Will watch your condition.  Will get help right away if you are not doing well or get worse. Document Released: 02/03/2005 Document Revised: 06/20/2013 Document Reviewed: 11/26/2012 [facility] Patient Information 2015 [place], [place]. This information is not intended to replace advice given to you by your health care provider. Make sure you discuss any questions you have with your health care provider.  Nosebleed Nosebleeds can be caused by many conditions, including trauma, infections, polyps, foreign bodies, dry mucous membranes or climate, medicines, and air conditioning. Most nosebleeds occur in the front of the nose. Because of this location, most nosebleeds can be controlled by pinching the nostrils gently and continuously for at least 10 to 20 minutes. The long, continuous pressure allows enough time for the blood to clot. If pressure is released during that 10 to 20 minute time period, the process may have to be started again. The nosebleed may stop by itself or quit with pressure, or it may need concentrated heating (cautery) or pressure from packing. HOME CARE INSTRUCTIONS   If your nose was packed, try to maintain the pack inside until your health care provider removes it. If a gauze pack was used and it starts to fall out, gently replace it or cut the end off. Do not cut if a balloon catheter was used to pack the nose. Otherwise, do not remove unless instructed.  Avoid blowing your nose for 12 hours after treatment. This could dislodge the pack or clot and start the bleeding again.  If the bleeding starts again, sit up and bend forward, gently pinching the front half of your nose continuously for 20  minutes.  If bleeding was caused by dry mucous membranes, use over-the-counter saline nasal spray or gel. This will keep the mucous membranes moist and allow them to heal. If you must use a lubricant, choose the water-soluble variety. Use it only sparingly and not within several hours of lying down.  Do not use petroleum jelly or mineral oil, as these may drip into the lungs and cause serious problems.  Maintain humidity in your home by using less air conditioning or by using a humidifier.  Do not use aspirin or medicines which make bleeding more likely. Your health care provider can give you recommendations on this.  Resume normal activities as you are able, but try to avoid straining, lifting, or bending at the waist for several days.  If the nosebleeds become recurrent and the cause is unknown, your health care provider may suggest laboratory tests. SEEK MEDICAL CARE IF: You have a fever. SEEK IMMEDIATE MEDICAL CARE IF:   Bleeding recurs and cannot be controlled.  There is unusual bleeding from or bruising on other parts of the body.  Nosebleeds continue.  There is any worsening of the condition which originally brought you in.  You become light-headed, feel faint, become sweaty, or vomit blood. MAKE SURE YOU:  Understand these instructions.  Will watch your condition.  Will get help right away if you are not doing well or get worse. Document Released: 11/13/2004 Document Revised: 06/20/2013 Document Reviewed: 01/04/2009 [facility] Patient Information 2015 [place], [place]. This information is not intended to replace advice given to you by your health care provider. Make sure you discuss any questions you have with your health care provider.    Emergency Department Resource Guide 1) Find a Doctor and Pay Out of Pocket Although you won't have to find out who is covered by your insurance plan, it is a good idea to ask around and get recommendations. You will then need to call  the office and see if the doctor you have chosen will accept you as a new patient and what types of options they offer for patients who are self-pay. Some doctors offer discounts or will set up payment plans for their patients who do not have insurance, but you will need to ask so you aren't surprised when you get to your appointment.  2) Contact Your Local Health Department Not all health departments have doctors that can see patients for sick visits, but many do, so it is worth a call to see if yours does. If you don't know where your local health department is, you can check in your phone book. The CDC also has a tool to help you locate your state's health department, and many state websites also have listings of all of their local health departments.  3) Find a [facility] If your illness is not likely to be very severe or complicated, you may want to try a walk in clinic. These are popping up all over the country in pharmacies, drugstores, and shopping centers. They're usually staffed by nurse practitioners or physician assistants that have been trained to treat common illnesses and complaints. They're usually fairly quick and inexpensive. However, if you have serious medical issues or chronic medical problems, these are probably not your best option.  No Primary Care Doctor: - Call Health Connect at  [phone] - they can help you locate a primary care doctor that  accepts your insurance, provides certain services, etc. - Physician Referral Service- [phone]  Chronic Pain Problems: Organization         Address  Phone   Notes  [facility]  [phone] Patients need to be referred by their primary care doctor.   Medication Assistance: Organization         Address  Phone   Notes  [facility] [address] [place], [place] [address] [phone] --Must be a resident of [facility] -- Must have NO insurance  coverage whatsoever (no Medicaid/ Medicare, etc.) -- The pt. MUST have a primary care doctor that directs their care regularly and follows them in the community   MedAssist  [phone]   [employer]  [phone]    Agencies that provide inexpensive medical care: Organization         Address  Phone   Notes  [place]  [phone]   [facility]    [phone]   [facility] [place], [place] [address] [phone]   [place] [phone]   Planned Parenthood    [phone]   [facility]    [phone]   [facility] and [facility]  [address], [place] Phone:  [phone], Fax:  [phone] Hours of Operation:  9 am - 6 pm, M-F.  Also accepts Medicaid/Medicare and self-pay.  [facility] for [place], [address], [place] Phone: [phone], Fax: [phone]. Hours of Operation:  8:30 am - 5:30 pm, M-F.  Also accepts Medicaid and self-pay.  [facility] [address], [place] Phone: [phone]   [place], [place], [place] [phone], Ext. [phone] Mondays & Thursdays: 7-9 AM.  First 15 patients are seen on a first come, first serve basis.    [place] Providers:  Organization         Address  Phone   Notes  [facility] [address], [place] [phone] Also accepts self-pay patients.  [facility] [id number] [place], [place]  [phone]   [place], [address], [place] [phone]   [facility] [address], [place] [phone]   Lucianne Lei [address], [place]   [phone] Only accepts [place] Access [place] patients after they have their  name applied to their card.   Self-Pay (no insurance) in [facility]:  Organization         Address  Phone   Notes  Sickle Cell Patients, [facility] Internal Medicine [place] [phone]   [facility] [place] [phone]   [facility] [place]  [place], [place], [place] [phone]   [facility]/Dr. Osei-Bonsu  [address] [place] or [address], [place] [phone] Phone number for both [place] and [place] locations is the same.  Urgent Medical and [facility] [address] [place] [phone]   [facility] [address] [place] or [address] [phone]   [facility] [address] [place] [phone], phone; [phone], fax Sees patients 1st and 3rd Saturday of every month.  Must not qualify for public or private insurance (i.e. Medicaid, Medicare, [place] Health Choice, Veterans' Benefits)  Household income should be no more than 200% of the poverty level The clinic cannot treat you if you are pregnant or think you are pregnant  Sexually transmitted diseases are not treated at the clinic.    Dental Care: Organization         Address  Phone  Notes  [facility] Department of [facility] [place] [phone] Accepts children up to age 66 who are enrolled in [place] or [place]; pregnant women with a Medicaid card; and children who have applied for Medicaid or [place] Health Choice, but were declined, whose parents can pay a reduced fee at time of service.  [facility] Department of [facility]  [address], [place] [phone] Accepts children up to age 73 who are enrolled in [place] or [place]; pregnant women with a Medicaid card; and children who have applied for  Medicaid or [place] Health Choice, but were declined, whose parents can pay a reduced fee at time of service.  [facility] Adult Dental Access PROGRAM  [place], [place] [phone]  Patients are seen by appointment only. Walk-ins are not accepted. [place] will see patients 101 years of age and older. Monday - Tuesday (8am-5pm) Most Wednesdays (8:30-5pm) $30 per visit, cash only  [facility] Adult Dental Access PROGRAM  [address], [facility] [phone] Patients are seen by appointment only. Walk-ins are not accepted. [place] will see patients 57 years of age and older. One Wednesday Evening (Monthly: Volunteer Based).  $30 per visit, cash only  [place]  [phone] for adults; Children under age 73, call Graduate Pediatric Dentistry at [phone]. Children aged 44-14, please call [phone] to request a pediatric application.  Dental services are provided in all areas of dental care including fillings, crowns and bridges, complete and partial dentures, implants, gum treatment, root canals, and extractions. Preventive care is also provided. Treatment is provided to both adults and children. Patients are selected via a lottery and there is often a waiting list.   [facility] [address], [place]  [phone] [email or web address]   [facility] [address] [place], [place] [phone], Ext. [phone] Second and Fourth Thursday of each month, opens at 6:30 AM; Clinic ends at 9 AM.  Patients are seen on a first-come first-served basis, and a limited number are seen during each clinic.   [facility]  [address] Hillard Danker [place], [place] [phone]   Eligibility Requirements You must have lived in [place], [place], or [place] counties for at least the last three months.   You cannot be eligible for state or federal sponsored [employer], including [employer], [place],  or [employer].   You generally cannot be eligible for healthcare insurance through your employer.    How to apply: Eligibility screenings are held every Tuesday and Wednesday afternoon from 1:00 pm until 4:00 pm. You do not need an appointment for the interview!  [facility] [address] [place], [place]   [place]  [place] Department  [place]  [phone]    Behavioral Health Resources in the Community: Intensive Outpatient Programs Organization         Address  Phone  Notes  [place]. [address], [place], [place] [phone]   [facility] [address] [place], [place]   ADS: Alcohol & Drug Svcs [address] [place], [place]   [place] [address]  [place], [place] or [phone]   Substance Abuse Resources Organization         Address  Phone  Notes  Alcohol and Drug Services  [phone]   [place]  [phone]   [facility] [place]   Chinita Pester  [phone]   Residential & Outpatient Substance Abuse Program  [phone]   Psychological Services Organization         Address  Phone  Notes  [facility] [place]  [place]  [phone]   [place] [address] [place] or [phone]    Mobile Crisis Teams Organization         Address  Phone  Notes  [facility], Mobile Crisis Care Unit  [phone]   [facility] Psychotherapeutic Services  [address] [place], [place]   [facility] [address] [place] 18  [place] [phone]    Self-Help/Support Groups Organization         Address  Phone             Notes  [facility]oc[facility] of [place] - variety of support groups   [place] Call for more information  Narcotics Anonymous (NA), [facility] [address], [employer] [place]  2 meetings at this location   Special educational needs teacher         Address  Phone  Notes  ASAP Residential Treatment [place],    [place]  [phone]   [facility]  [address], [place] [phone], [place], [place]   [place], [place] [phone] Admissions: 8am-3pm M-F  Incentives Substance [place] [address],    [place], [place] [phone]   [facility] [address] [place], [place], [place]   [facility] [address],  [place], [place]   Insight Programs - Intensive Outpatient [address] Kristeen [address], [place], [place]   [facility] ([place] [place].,  [place], [place] [phone] or [phone]   Residential Treatment Services (RTS) [address] [place], [place] Accepts Medicaid  [facility] [place] [address],  [place] [phone] Substance Abuse/Addiction Treatment   [facility] Organization         Address  Phone  Notes  [facility]  [phone]   Domenic Schwab, PhD [address] Arlis Porta [place], [place]   [phone] or [phone]   [place], [place] [phone]   [facility] [address] [place], [place] [phone] Insurance/Medicaid/sponsorship through [facility] and Families [address] [place]                                    [place], [place] [phone] [place] [address], [place] [phone]    Dr. Adele Schilder  [phone]   [facility] Dept. 1) [address], [place] 2) [place] 3)  [place] [address], [place] [phone]  [phone]   [place] [phone] or [phone] (After Hours)

## 2014-02-13 NOTE — Care Management Note (Addendum)
    Page 1 of 1   02/16/2014     3:28:55 PM CARE MANAGEMENT NOTE 02/16/2014  Patient:  Darren Howard, Darren Howard   Account Number:  [id number]  Date Initiated:  02/13/2014  Documentation initiated by:  Theophilus Kinds  Subjective/Objective Assessment:   Pt admitted from home with epistaxis and htn urgency. Pt lives alone and will return home at discharge. Pt is independent with ADL's.     Action/Plan:   No CM needs noted.   Anticipated DC Date:  02/16/2014   Anticipated DC Plan:  [place]  CM consult  Follow-up appt scheduled      Choice offered to / List presented to:             Status of service:  Completed, signed off Medicare Important Message given?   (If response is "NO", the following Medicare IM given date fields will be blank) Date Medicare IM given:   Medicare IM given by:   Date Additional Medicare IM given:   Additional Medicare IM given by:    Discharge Disposition:  HOME/SELF CARE  Per UR Regulation:    If discussed at Long Length of Stay Meetings, dates discussed:    Comments:  02/16/14 [place] RN/CM [place] will take Mr Appleby as a new pt, since he is an old pt of theirs. Appt made for 03/09/14 at 10am, but pt gone- D/C home already when CM went to give him appt. Attempting to call pt but no answer at # listed in chart. Will continue to call 02/15/14 [place], RN BSN CM Pt [facility] to see Dr[facility] Hil[facility]av[facility] of [facility]. Pt had not been seen since 2009 so Dr. Hilma Favors not taking new pts. Pt followup set [facility] with [facility] and appt doumented on AVS.Pt also made aware. Pt given name of glucometer at [facility] to purchase at [place]. Pt may benefit from [facility] RN at discharge but pt aniticipates to return to work within a week of discharge so pt will not be homebound. No other Cm needs noted.  02/13/14 [place], RN BSN CM

## 2014-02-14 DIAGNOSIS — E119 Type 2 diabetes mellitus without complications: Secondary | ICD-10-CM

## 2014-02-14 DIAGNOSIS — I10 Essential (primary) hypertension: Secondary | ICD-10-CM

## 2014-02-14 DIAGNOSIS — R000 Tachycardia, unspecified: Secondary | ICD-10-CM

## 2014-02-14 DIAGNOSIS — I159 Secondary hypertension, unspecified: Secondary | ICD-10-CM

## 2014-02-14 DIAGNOSIS — R040 Epistaxis: Principal | ICD-10-CM

## 2014-02-14 LAB — CBC WITH DIFFERENTIAL/PLATELET
Basophils Absolute: 0 10*3/uL (ref 0.0–0.1)
Basophils Relative: 1 % (ref 0–1)
Eosinophils Absolute: 0.2 10*3/uL (ref 0.0–0.7)
Eosinophils Relative: 4 % (ref 0–5)
HCT: 38.2 % — ABNORMAL LOW (ref 39.0–52.0)
Hemoglobin: 13.1 g/dL (ref 13.0–17.0)
LYMPHS ABS: 1.4 10*3/uL (ref 0.7–4.0)
LYMPHS PCT: 26 % (ref 12–46)
MCH: 29.9 pg (ref 26.0–34.0)
MCHC: 34.3 g/dL (ref 30.0–36.0)
MCV: 87.2 fL (ref 78.0–100.0)
MONO ABS: 0.6 10*3/uL (ref 0.1–1.0)
Monocytes Relative: 11 % (ref 3–12)
Neutro Abs: 3.3 10*3/uL (ref 1.7–7.7)
Neutrophils Relative %: 58 % (ref 43–77)
Platelets: 245 10*3/uL (ref 150–400)
RBC: 4.38 MIL/uL (ref 4.22–5.81)
RDW: 13.5 % (ref 11.5–15.5)
WBC: 5.6 10*3/uL (ref 4.0–10.5)

## 2014-02-14 LAB — GLUCOSE, CAPILLARY
GLUCOSE-CAPILLARY: 136 mg/dL — AB (ref 70–99)
GLUCOSE-CAPILLARY: 303 mg/dL — AB (ref 70–99)
Glucose-Capillary: 413 mg/dL — ABNORMAL HIGH (ref 70–99)
Glucose-Capillary: 325 mg/dL — ABNORMAL HIGH (ref 70–99)

## 2014-02-14 MED ORDER — INSULIN ASPART 100 UNIT/ML ~~LOC~~ SOLN
6.0000 [IU] | Freq: Three times a day (TID) | SUBCUTANEOUS | Status: DC
  Administered 2014-02-14: 6 [IU] via SUBCUTANEOUS

## 2014-02-14 MED ORDER — INSULIN ASPART 100 UNIT/ML ~~LOC~~ SOLN
0.0000 [IU] | Freq: Three times a day (TID) | SUBCUTANEOUS | Status: DC
  Administered 2014-02-14: 15 [IU] via SUBCUTANEOUS

## 2014-02-14 MED ORDER — INSULIN ASPART PROT & ASPART (70-30 MIX) 100 UNIT/ML ~~LOC~~ SUSP
14.0000 [IU] | Freq: Two times a day (BID) | SUBCUTANEOUS | Status: DC
  Administered 2014-02-15: 14 [IU] via SUBCUTANEOUS
  Filled 2014-02-14: qty 10

## 2014-02-14 MED ORDER — INSULIN STARTER KIT- PEN NEEDLES (ENGLISH)
1.0000 | Freq: Once | Status: AC
  Administered 2014-02-14: 1
  Filled 2014-02-14: qty 1

## 2014-02-14 MED ORDER — INSULIN ASPART PROT & ASPART (70-30 MIX) 100 UNIT/ML ~~LOC~~ SUSP
12.0000 [IU] | Freq: Two times a day (BID) | SUBCUTANEOUS | Status: DC
  Administered 2014-02-14: 12 [IU] via SUBCUTANEOUS
  Filled 2014-02-14: qty 10

## 2014-02-14 MED ORDER — INSULIN ASPART 100 UNIT/ML ~~LOC~~ SOLN: 0.0000 [IU] | Freq: Every day | SUBCUTANEOUS | Status: DC

## 2014-02-14 NOTE — Progress Notes (Signed)
UR completed 

## 2014-02-14 NOTE — Progress Notes (Signed)
Darren Howard [id number] DOB: 12/17/1950 DOA: 02/12/2014 PCP: No primary care provider on file.  Brief narrative: Pleasant noncompliant 63 year old male not taking any blood pressure medications, prior history cardiac cath with clean coronary arteries 2002 admitted with headache and epistaxis and And bright red blood per rectum-slightly positive troponin 0.04 and admitted. He is not taking medications for the past couple of months works outside in the cold and feels that he was coming down with cold. She is also his blood pressure is high and this may cause him to have headaches and then the epistaxis.  He is currently doing better does not have any headaches at this time Epistaxis seems to have cleared    Past medical history-As per Problem list Chart reviewed as below- reviewed  Consultants:  none  Procedures:  non  Antibiotics:  none   Subjective  Alert oriented pleasant no headaches no further epistaxis no other issues at present. Tolerating diet Understood to some extent what diabetic coordinator stated    Objective    Interim History: none  Telemetry: 4 beats of nonsustained V. tach otherwise sinus rhythm   Objective: Filed Vitals:   02/13/14 0401 02/13/14 1411 02/13/14 2118 02/14/14 0704  BP: 160/83 145/61 139/59 133/85  Pulse: 93 81 81 79  Temp: 98.4 F (36.9 C) 98.8 F (37.1 C) 98.2 F (36.8 C) 98.5 F (36.9 C)  TempSrc:  Oral Oral Oral  Resp: 20 20 20 20   Height:      Weight:    102.059 kg (225 lb)  SpO2: 100% 99% 100% 99%    Intake/Output Summary (Last 24 hours) at 02/14/14 1657 Last data filed at 02/13/14 1800  Gross per 24 hour  Intake    240 ml  Output      0 ml  Net    240 ml    Exam:  General: EOMI NCAT Cardiovascular: S1-S2 no murmur rub or gallop Respiratory: Clinically clear no added sound Abdomen: Obese nontender nondistended Skin no lower extremity edema Neuro  intact  Data Reviewed: Basic Metabolic Panel:  Recent Labs Lab 02/12/14 2230 02/13/14 0534  NA 131* 131*  K 4.9 4.6  CL 99 100  CO2 19 20  GLUCOSE 394* 337*  BUN 27* 25*  CREATININE 1.22 1.04  CALCIUM 8.9 8.5   Liver Function Tests:  Recent Labs Lab 02/12/14 2230 02/13/14 0534  AST 23 17  ALT 28 24  ALKPHOS 87 79  BILITOT 0.8 0.9  PROT 6.8 6.3  ALBUMIN 3.4* 3.1*   No results for input(s): LIPASE, AMYLASE in the last 168 hours. No results for input(s): AMMONIA in the last 168 hours. CBC:  Recent Labs Lab 02/12/14 2230 02/13/14 0534 02/14/14 0715  WBC 6.1 5.8 5.6  NEUTROABS 3.6  --  3.3  HGB 12.8* 11.6* 13.1  HCT 38.6* 35.0* 38.2*  MCV 88.3 87.7 87.2  PLT 235 210 245   Cardiac Enzymes:  Recent Labs Lab 02/12/14 2230 02/13/14 0534 02/13/14 1135 02/13/14 1717  TROPONINI 0.04* 0.06* 0.05* 0.04*   BNP: Invalid input(s): POCBNP CBG:  Recent Labs Lab 02/13/14 1622 02/13/14 2120 02/14/14 0947 02/14/14 1236 02/14/14 1619  GLUCAP 388* 351* 413* 325* 136*    No results found for this or any previous visit (from the past 240 hour(s)).   Studies:              All Imaging reviewed and is as per above notation   Scheduled Meds: . carvedilol  12.5 mg Oral BID  WC  . clindamycin  300 mg Oral 3 times per day  . insulin aspart protamine- aspart  12 Units Subcutaneous BID WC  . losartan  50 mg Oral Daily  . sodium chloride  3 mL Intravenous Q12H  . sodium chloride  3 mL Intravenous Q12H   Continuous Infusions:   I have seen him yet and he is welcome  Assessment/Plan: 1. Epistaxis secondary to hypertension-resolved. Nasal trumpet removed 2. Hypertensive urgency-continue Coreg 12.5 twice a day losartan 50 daily.  Will discontinue hydralazine and if not controlled will uptitrate one of these 2 medications 3. Uncontrolled diabetes mellitus, A1c 14 -appreciate diabetic coordinator input. Given the fact that patient experiences some confusion regarding  types of insulin dosing etc. etc. have simplified to 7030 insulin 12 units which we will uptitrate in the morning. Patient has indicated his son can come up and learn how to use this and watch videos with him. 4. Hyponatremia unclear etiology. Monitor 5. 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 Labs in a.m.  Code Status: Full Family Communication: None at bedside Disposition Plan: Inpatient potential discharge 12/30   Verneita Griffes, MD  [facility] Pager [phone] 02/14/2014, 4:57 PM    LOS: 2 days

## 2014-02-14 NOTE — Progress Notes (Signed)
Nutrition Brief Note  RD received consult for education for new onset diabetes.   Attempted to visit pt multiple times throughout the day, however, pt unavailable at times of visits. Will follow-up on 02/16/14 if pt is still in the hospital. Pt has already been seen by the diabetes coordinator, who is recommending outpatient diabetes education.   Ashya Nicolaisen A. Jimmye Norman, RD, LDN Pager: [phone]

## 2014-02-14 NOTE — Progress Notes (Addendum)
Inpatient Diabetes Program Recommendations  AACE/ADA: New Consensus Statement on Inpatient Glycemic Control (2013)  Target Ranges:  Prepandial:   less than 140 mg/dL      Peak postprandial:   less than 180 mg/dL (1-2 hours)      Critically ill patients:  140 - 180 mg/dL   Results for DAMETRI, OZBURN (MRN [id number]) as of 02/14/2014 10:23  Ref. Range 02/13/2014 07:25 02/13/2014 11:52 02/13/2014 16:22 02/13/2014 21:20 02/14/2014 09:47  Glucose-Capillary Latest Range: 70-99 mg/dL 273 (H) 395 (H) 388 (H) 351 (H) 413 (H)   Diabetes history: Glucose Intolerance Outpatient Diabetes medications: No Current orders for Inpatient glycemic control: Levemir 10 units daily, Novolog 0-9 units AC, Novolog 0-5 units HS  Inpatient Diabetes Program Recommendations Insulin - Basal: Please consider increasing Levemir to 25 units daily (based on 102 kg x 0.25 units). Correction (SSI): Please consider increasing Novolog correction to moderate scale. Insulin - Meal Coverage: Please consider ordering meal coverage as Novolog 5 units TID with meals (in addition to Novolog correction scale). HgbA1C:  A1C is 14.9% on 02/13/14. Outpatient Referral: Please refer for outpatient diabetes education.   02/14/14-@13 :55-Spoke with patient about new diabetes diagnosis. Patient reports that he had a heart cath in 2012 and was diagnosed with diabetes at that time but he started on oral DM medications but then stopped taking all of his medications about 2 years ago after his wife died. Discussed A1C results (14.9% on 02/13/14) and explained what an A1C is, basic pathophysiology of DM Type 2, basic home care, stressed importance of checking CBGs and maintaining good CBG control to prevent long-term and short-term complications. Reviewed signs and symptoms of hyperglycemia and hypoglycemia along with treatment for both. Reviewed Living Well with Diabetes booklet in detail with patient. Discussed impact of nutrition, exercise, stress,  sickness, and medications on diabetes control.   Discussed Levemir and Novolog insulins along with onset and duration. Discussed vial/syringe and insulin pens. Patient states that he would like to try insulin pens for insulin administration. Educated patient on insulin pen use at home. Reviewed all steps of insulin pen including attachment of needle, 2-unit air shot, dialing up dose, giving injection, removing needle, disposal of sharps, storage of unused insulin, disposal of insulin etc. Patient able to provide successful return demonstration. Also reviewed proper technique for insulin injections with vial and syringe and patient was able to return demonstration of proper technique. While with patient, he successfully administered a SQ insulin injection in his abdomen without any trouble.  RNs to provide ongoing basic DM education at bedside with this patient and engage patient to actively check blood glucose and administer insulin injections. Have ordered educational booklet, insulin starter kit, and DM videos. Patient appears to be having difficulty with retaining information discussed and may need a simplified insulin regimen to ensure compliance. Have asked patient to check his glucose 4 times a day (before meals and at bedtime) and to keep a log of glucose values to take with him to his follow up visit with Dr. Hilma Favors (PCP).  Patient verbalized understanding of information discussed and he states that he has no further questions at this time related to diabetes.   At time of discharge, please provide patient with Rxs for glucometer, test strips, lancets,  insulin pens, and insulin pen needles.  Thanks, Barnie Alderman, RN, MSN, CCRN, CDE Diabetes Coordinator Inpatient Diabetes Program [phone] (Team Pager) [phone] ([place] office) [phone] [facility] office)

## 2014-02-15 LAB — GLUCOSE, CAPILLARY
GLUCOSE-CAPILLARY: 293 mg/dL — AB (ref 70–99)
GLUCOSE-CAPILLARY: 400 mg/dL — AB (ref 70–99)
GLUCOSE-CAPILLARY: 382 mg/dL — AB (ref 70–99)
Glucose-Capillary: 271 mg/dL — ABNORMAL HIGH (ref 70–99)
Glucose-Capillary: 316 mg/dL — ABNORMAL HIGH (ref 70–99)

## 2014-02-15 LAB — COMPREHENSIVE METABOLIC PANEL
ALBUMIN: 3 g/dL — AB (ref 3.5–5.2)
ALT: 35 U/L (ref 0–53)
AST: 38 U/L — AB (ref 0–37)
Alkaline Phosphatase: 73 U/L (ref 39–117)
Anion gap: 8 (ref 5–15)
BUN: 21 mg/dL (ref 6–23)
CHLORIDE: 100 meq/L (ref 96–112)
CO2: 23 mmol/L (ref 19–32)
Calcium: 8 mg/dL — ABNORMAL LOW (ref 8.4–10.5)
Creatinine, Ser: 1.18 mg/dL (ref 0.50–1.35)
GFR calc Af Amer: 75 mL/min — ABNORMAL LOW (ref >90)
GFR, EST NON AFRICAN AMERICAN: 65 mL/min — AB (ref >90)
Glucose, Bld: 330 mg/dL — ABNORMAL HIGH (ref 70–99)
Potassium: 4 mmol/L (ref 3.5–5.1)
SODIUM: 131 mmol/L — AB (ref 135–145)
Total Bilirubin: 0.6 mg/dL (ref 0.3–1.2)
Total Protein: 6.1 g/dL (ref 6.0–8.3)

## 2014-02-15 MED ORDER — INSULIN ASPART 100 UNIT/ML ~~LOC~~ SOLN
5.0000 [IU] | Freq: Once | SUBCUTANEOUS | Status: AC
  Administered 2014-02-15: 5 [IU] via SUBCUTANEOUS

## 2014-02-15 MED ORDER — CARVEDILOL 12.5 MG PO TABS
25.0000 mg | ORAL_TABLET | Freq: Two times a day (BID) | ORAL | Status: DC
  Administered 2014-02-15 – 2014-02-16 (×2): 25 mg via ORAL
  Filled 2014-02-15 (×2): qty 2

## 2014-02-15 MED ORDER — INSULIN ASPART 100 UNIT/ML ~~LOC~~ SOLN
8.0000 [IU] | Freq: Once | SUBCUTANEOUS | Status: AC
  Administered 2014-02-15: 8 [IU] via SUBCUTANEOUS

## 2014-02-15 MED ORDER — INSULIN ASPART PROT & ASPART (70-30 MIX) 100 UNIT/ML ~~LOC~~ SUSP
25.0000 [IU] | Freq: Two times a day (BID) | SUBCUTANEOUS | Status: DC
  Administered 2014-02-15 – 2014-02-16 (×2): 25 [IU] via SUBCUTANEOUS

## 2014-02-15 MED ORDER — INSULIN ASPART PROT & ASPART (70-30 MIX) 100 UNIT/ML ~~LOC~~ SUSP
18.0000 [IU] | Freq: Two times a day (BID) | SUBCUTANEOUS | Status: DC
Start: 2014-02-15 — End: 2014-02-15
  Filled 2014-02-15: qty 10

## 2014-02-15 NOTE — Progress Notes (Signed)
Inpatient Diabetes Program Recommendations  AACE/ADA: New Consensus Statement on Inpatient Glycemic Control (2013)  Target Ranges:  Prepandial:   less than 140 mg/dL      Peak postprandial:   less than 180 mg/dL (1-2 hours)      Critically ill patients:  140 - 180 mg/dL   Results for Darren Howard, Darren Howard (MRN [id number]) as of 02/15/2014 11:16  Ref. Range 02/14/2014 09:47 02/14/2014 12:36 02/14/2014 16:19 02/14/2014 21:10 02/15/2014 07:25  Glucose-Capillary Latest Range: 70-99 mg/dL 413 (H) 325 (H) 136 (H) 303 (H) 293 (H)   Diabetes history: DM2 Outpatient Diabetes medications: None Current orders for Inpatient glycemic control: 70/30 18 units BID  Inpatient Diabetes Program Recommendations Correction (SSI): Please consider ordering Novolog correction scale ACHS (in addition to 70/30 insulin as ordered). Outpatient Referral: Please refer for outpatient diabetes education.  Thanks, Barnie Alderman, RN, MSN, CCRN, CDE Diabetes Coordinator Inpatient Diabetes Program [phone] (Team Pager) [phone] ([place] office) [phone] [facility] office)

## 2014-02-15 NOTE — Progress Notes (Signed)
Pt's blood sugar 303, MD paged and orders place to change dose of 70/30 Insulin.

## 2014-02-15 NOTE — Progress Notes (Signed)
Darren Howard [id number] DOB: 1950-05-22 DOA: 02/12/2014 PCP: No primary care provider on file.  Brief narrative: Pleasant noncompliant 63 year old male not taking any blood pressure medications, prior history cardiac cath with clean coronary arteries 2002 admitted with headache and epistaxis and And bright red blood per rectum-slightly positive troponin 0.04 and admitted. He is not taking medications for the past couple of months works outside in the cold and feels that he was coming down with cold. She is also his blood pressure is high and this may cause him to have headaches and then the epistaxis.  He is currently doing better does not have any headaches at this time Epistaxis seems to have cleared    Past medical history-As per Problem list Chart reviewed as below- reviewed  Consultants:  none  Procedures:  non  Antibiotics:  none   Subjective   Alert oriented pleasant no headaches no further epistaxis no other issues at present. Tolerating diet-ate 4 -5 packets of cookies all night!!!    Objective    Interim History: none  Telemetry: 4 beats of nonsustained V. tach otherwise sinus rhythm   Objective: Filed Vitals:   02/14/14 2112 02/15/14 0529 02/15/14 0532 02/15/14 1433  BP: 126/67 166/89  149/76  Pulse: 78 90  84  Temp: 98.9 F (37.2 C) 98.1 F (36.7 C)  98.7 F (37.1 C)  TempSrc: Oral Oral  Oral  Resp: 20 20  20   Height:      Weight:   101.606 kg (224 lb)   SpO2: 99% 100%  100%    Intake/Output Summary (Last 24 hours) at 02/15/14 1638 Last data filed at 02/15/14 1200  Gross per 24 hour  Intake    480 ml  Output      0 ml  Net    480 ml    Exam:  General: EOMI NCAT Cardiovascular: S1-S2 no murmur rub or gallop Respiratory: Clinically clear no added sound Abdomen: Obese nontender nondistended Skin no lower extremity edema Neuro intact  Data Reviewed: Basic Metabolic Panel:  Recent  Labs Lab 02/12/14 2230 02/13/14 0534 02/15/14 0608  NA 131* 131* 131*  K 4.9 4.6 4.0  CL 99 100 100  CO2 19 20 23   GLUCOSE 394* 337* 330*  BUN 27* 25* 21  CREATININE 1.22 1.04 1.18  CALCIUM 8.9 8.5 8.0*   Liver Function Tests:  Recent Labs Lab 02/12/14 2230 02/13/14 0534 02/15/14 0608  AST 23 17 38*  ALT 28 24 35  ALKPHOS 87 79 73  BILITOT 0.8 0.9 0.6  PROT 6.8 6.3 6.1  ALBUMIN 3.4* 3.1* 3.0*   No results for input(s): LIPASE, AMYLASE in the last 168 hours. No results for input(s): AMMONIA in the last 168 hours. CBC:  Recent Labs Lab 02/12/14 2230 02/13/14 0534 02/14/14 0715  WBC 6.1 5.8 5.6  NEUTROABS 3.6  --  3.3  HGB 12.8* 11.6* 13.1  HCT 38.6* 35.0* 38.2*  MCV 88.3 87.7 87.2  PLT 235 210 245   Cardiac Enzymes:  Recent Labs Lab 02/12/14 2230 02/13/14 0534 02/13/14 1135 02/13/14 1717  TROPONINI 0.04* 0.06* 0.05* 0.04*   BNP: Invalid input(s): POCBNP CBG:  Recent Labs Lab 02/14/14 1619 02/14/14 2110 02/15/14 0725 02/15/14 1125 02/15/14 1209  GLUCAP 136* 303* 293* 400* 382*    No results found for this or any previous visit (from the past 240 hour(s)).   Studies:              All Imaging reviewed and is  as per above notation   Scheduled Meds: . carvedilol  12.5 mg Oral BID WC  . insulin aspart protamine- aspart  25 Units Subcutaneous BID WC  . losartan  50 mg Oral Daily  . sodium chloride  3 mL Intravenous Q12H  . sodium chloride  3 mL Intravenous Q12H   Continuous Infusions:   I have seen him yet and he is welcome  Assessment/Plan: 1. Epistaxis secondary to hypertension-resolved. Nasal trumpet removed.  resolved 2. Hypertensive urgency-continue Coreg 12.5 twice a day [increased 12/30 to 25 bid] losartan 50 daily.   3. Uncontrolled diabetes mellitus, A1c 14 -appreciate diabetic coordinator input. Given the fact that patient experiences some confusion regarding types of insulin dosing etc. etc. have simplified to 7030 insulin  12-->25 units which we will adjust prior to d/c. Patient is non-compliant on diet and will need a lot of education 4. Hyponatremia unclear etiology. Monitor 5. 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 resolving  Code Status: Full Family Communication: nephew Disposition Plan: Inpatient potential discharge 12/31   Verneita Griffes, MD  [facility] Pager [phone] 02/15/2014, 4:38 PM    LOS: 3 days

## 2014-02-16 DIAGNOSIS — N189 Chronic kidney disease, unspecified: Secondary | ICD-10-CM

## 2014-02-16 DIAGNOSIS — E1122 Type 2 diabetes mellitus with diabetic chronic kidney disease: Secondary | ICD-10-CM

## 2014-02-16 LAB — GLUCOSE, CAPILLARY
GLUCOSE-CAPILLARY: 324 mg/dL — AB (ref 70–99)
Glucose-Capillary: 284 mg/dL — ABNORMAL HIGH (ref 70–99)

## 2014-02-16 MED ORDER — AMLODIPINE BESYLATE 10 MG PO TABS: 10.0000 mg | ORAL_TABLET | Freq: Every day | ORAL | Status: DC

## 2014-02-16 MED ORDER — "INSULIN SYRINGE 31G X 5/16"" 0.5 ML MISC": 40.0000 | Freq: Two times a day (BID) | Status: DC

## 2014-02-16 MED ORDER — INSULIN ASPART PROT & ASPART (70-30 MIX) 100 UNIT/ML ~~LOC~~ SUSP: 25.0000 [IU] | Freq: Two times a day (BID) | SUBCUTANEOUS | Status: DC

## 2014-02-16 MED ORDER — LISINOPRIL 10 MG PO TABS: 10.0000 mg | ORAL_TABLET | Freq: Every day | ORAL | Status: DC

## 2014-02-16 MED ORDER — METFORMIN HCL 500 MG PO TABS: 500.0000 mg | ORAL_TABLET | Freq: Two times a day (BID) | ORAL | Status: DC

## 2014-02-16 MED ORDER — BLOOD GLUCOSE-BP MONITOR DEVI: 1.0000 | Freq: Three times a day (TID) | Status: DC

## 2014-02-16 MED ORDER — CARVEDILOL 25 MG PO TABS: 25.0000 mg | ORAL_TABLET | Freq: Two times a day (BID) | ORAL | Status: DC

## 2014-02-16 MED ORDER — LANCETS MICRO THIN 33G MISC: 30.0000 | Freq: Three times a day (TID) | Status: DC

## 2014-02-16 NOTE — Discharge Summary (Signed)
Physician Discharge Summary  Darren Howard [id number] DOB: [birth year] DOA: 02/12/2014  PCP: No primary care provider on file.  Admit date: 02/12/2014 Discharge date: 02/16/2014  Time spent: 35 minutes  Recommendations for Outpatient Follow-up:  1. Continue all medications as prescribed 2. Follow-up primary care physician 3. Diabetic teaching performed during hospital and will need outpatient follow-up 4. High propensity for readmission if patient noncompliant  Discharge Diagnoses:  Active Problems:   Epistaxis   Hypertension   Anemia   DM2 (diabetes mellitus, type 2)   Tachycardia   Hypertensive urgency   Discharge Condition: Fair  Diet recommendation: Heart healthy low-salt  Filed Weights   02/13/14 0300 02/14/14 0704 02/15/14 0532  Weight: 101.107 kg (222 lb 14.4 oz) 102.059 kg (225 lb) 101.606 kg (224 lb)    History of present illness:  Pleasant noncompliant 63 year old male not taking any blood pressure medications, prior history cardiac cath with clean coronary arteries 2002 admitted with headache and epistaxis and And bright red blood per rectum-slightly positive troponin 0.04 and admitted. He is not taking medications for the past couple of months works outside in the cold and feels that he was coming down with cold. She is also his blood pressure is high and this may cause him to have headaches and then the epistaxis.  He is currently doing better does not have any headaches at this time Epistaxis seems to have cleared he was kept in hospital and started on multiple medications such as Coreg 25 twice a day, losartan 50 daily His A1c was 14 and diabetic coordinator input was sought. 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 and will need to be closely followed as an outpatient    Discharge Exam: Filed Vitals:    02/16/14 0928  BP:   Pulse: 83  Temp:   Resp:     Alert pleasant oriented no apparent distress   S1-S2 no murmur rub or gallop  Discharge Instructions    Discharge Medication List as of 02/16/2014  1:22 PM    START taking these medications   Details  carvedilol (COREG) 25 MG tablet Take 1 tablet (25 mg total) by mouth 2 (two) times daily with a meal., Starting 02/16/2014, Until Discontinued, Print    insulin aspart protamine- aspart (NOVOLOG MIX 70/30) (70-30) 100 UNIT/ML injection Inject 0.25 mLs (25 Units total) into the skin 2 (two) times daily with a meal., Starting 02/16/2014, Until Discontinued, Print    metFORMIN (GLUCOPHAGE) 500 MG tablet Take 1 tablet (500 mg total) by mouth 2 (two) times daily with a meal., Starting 02/16/2014, Until Discontinued, Print    Blood Glucose-BP Monitor (BLOOD GLUCOSE-WRIST BP MONITOR) DEVI 1 applicator by Does not apply route 4 (four) times daily -  before meals and at bedtime., Starting 02/16/2014, Until Discontinued, Print    Insulin Syringe-Needle U-100 (INSULIN SYRINGE .5CC/31GX5/16") 31G X 5/16" 0.5 ML MISC 40 each by Does not apply route 2 (two) times daily., Starting 02/16/2014, Until Discontinued, Print    LANCETS MICRO THIN 33G MISC 30 each by Does not apply route 4 (four) times daily -  before meals and at bedtime., Starting 02/16/2014, Until Discontinued, Print      CONTINUE these medications which have CHANGED   Details  amLODipine (NORVASC) 10 MG tablet Take 1 tablet (10 mg total) by mouth daily., Starting 02/16/2014, Until Discontinued, Print    lisinopril (PRINIVIL,ZESTRIL)  10 MG tablet Take 1 tablet (10 mg total) by mouth daily., Starting 02/16/2014, Until Discontinued, Print      CONTINUE these medications which have NOT CHANGED   Details  acetaminophen (TYLENOL) 500 MG tablet Take 500 mg by mouth every 6 (six) hours as needed for mild pain or moderate pain., Until Discontinued, Historical Med       No Known  Allergies Follow-up Information    Follow up with [facility]. Schedule an appointment as soon as possible for a visit in 2 days.   Contact information:   [address] [id number]      Follow up On 03/16/2014.      Follow up with [facility].   Specialty:  Family Medicine   Why:  03/09/13 at [place] information:   [place] [address] [phone]        The results of significant diagnostics from this hospitalization (including imaging, microbiology, ancillary and laboratory) are listed below for reference.    Significant Diagnostic Studies: No results found.  Microbiology: No results found for this or any previous visit (from the past 240 hour(s)).   Labs: Basic Metabolic Panel:  Recent Labs Lab 02/12/14 2230 02/13/14 0534 02/15/14 0608  NA 131* 131* 131*  K 4.9 4.6 4.0  CL 99 100 100  CO2 19 20 23   GLUCOSE 394* 337* 330*  BUN 27* 25* 21  CREATININE 1.22 1.04 1.18  CALCIUM 8.9 8.5 8.0*   Liver Function Tests:  Recent Labs Lab 02/12/14 2230 02/13/14 0534 02/15/14 0608  AST 23 17 38*  ALT 28 24 35  ALKPHOS 87 79 73  BILITOT 0.8 0.9 0.6  PROT 6.8 6.3 6.1  ALBUMIN 3.4* 3.1* 3.0*   No results for input(s): LIPASE, AMYLASE in the last 168 hours. No results for input(s): AMMONIA in the last 168 hours. CBC:  Recent Labs Lab 02/12/14 2230 02/13/14 0534 02/14/14 0715  WBC 6.1 5.8 5.6  NEUTROABS 3.6  --  3.3  HGB 12.8* 11.6* 13.1  HCT 38.6* 35.0* 38.2*  MCV 88.3 87.7 87.2  PLT 235 210 245   Cardiac Enzymes:  Recent Labs Lab 02/12/14 2230 02/13/14 0534 02/13/14 1135 02/13/14 1717  TROPONINI 0.04* 0.06* 0.05* 0.04*   BNP: BNP (last 3 results) No results for input(s): PROBNP in the last 8760 hours. CBG:  Recent Labs Lab 02/15/14 1125 02/15/14 1209 02/15/14 1632 02/15/14 2119 02/16/14 0730  GLUCAP 400* 382* 271* 316* 284*        Signed:  Nita Sells  [facility] 02/16/2014, 11:22 AM

## 2014-02-16 NOTE — Plan of Care (Signed)
Problem: Food- and Nutrition-Related Knowledge Deficit (NB-1.1) Goal: Nutrition education Formal process to instruct or train a patient/client in a skill or to impart knowledge to help patients/clients voluntarily manage or modify food choices and eating behavior to maintain or improve health. Outcome: Adequate for Discharge  RD consulted for nutrition education regarding diabetes.     Lab Results  Component Value Date    HGBA1C 14.9* 02/13/2014    RD provided "Carbohydrate Counting for People with Diabetes" handout from the Academy of Nutrition and Dietetics. Discussed different food groups and their effects on blood sugar, emphasizing carbohydrate-containing foods. Provided list of carbohydrates and recommended serving sizes of common foods.  Discussed importance of controlled and consistent carbohydrate intake throughout the day. Provided examples of ways to balance meals/snacks and encouraged intake of high-fiber, whole grain complex carbohydrates. Teach back method used.  Expect fair compliance.  Body mass index is 25.89 kg/(m^2). Pt meets criteria for overweight based on current BMI.  Current diet order is Carb Modified, patient is consuming approximately 100% of meals at this time. Labs and medications reviewed. No further nutrition interventions warranted at this time. RD contact information provided. If additional nutrition issues arise, please re-consult RD.  Darren Howard A. Jimmye Norman, RD, LDN, CDE Pager: [phone]

## 2014-02-16 NOTE — Progress Notes (Signed)
Patient and sons received discharge instructions and scripts.  Answered questions about new medications, when and how to check blood sugar, signs of hypoglycemia, and diet.  Patient and sons verbalized understanding.  IV was removed and was clean, dry, and intact at removal.  Patient in stable condition at discharge.  Patient requested to walk to front entrance, escorted by nurse.

## 2014-02-16 NOTE — Progress Notes (Addendum)
Inpatient Diabetes Program Recommendations  AACE/ADA: New Consensus Statement on Inpatient Glycemic Control (2013)  Target Ranges:  Prepandial:   less than 140 mg/dL      Peak postprandial:   less than 180 mg/dL (1-2 hours)      Critically ill patients:  140 - 180 mg/dL   Results for Darren Howard, Darren Howard (MRN [id number]) as of 02/16/2014 09:03  Ref. Range 02/15/2014 07:25 02/15/2014 11:25 02/15/2014 12:09 02/15/2014 16:32 02/15/2014 21:19 02/16/2014 07:30  Glucose-Capillary Latest Range: 70-99 mg/dL 293 (H) 400 (H) 382 (H) 271 (H) 316 (H) 284 (H)   Diabetes history: DM2 Outpatient Diabetes medications: None Current orders for Inpatient glycemic control: 70/30 25 units BID  Inpatient Diabetes Program Recommendations Insulin - Basal: Please consider increasing 70/30 to 28 units BID. Correction (SSI): While inpatient, please consider ordering Novolog correction scale ACHS (in addition to 70/30 insulin as ordered). Outpatient Referral: Please refer for outpatient diabetes education.  Thanks, Barnie Alderman, RN, MSN, CCRN, CDE Diabetes Coordinator Inpatient Diabetes Program [phone] (Team Pager) [phone] ([place] office) [phone] [facility] office)

## 2014-02-20 ENCOUNTER — Encounter (HOSPITAL_COMMUNITY): Payer: Self-pay | Admitting: Emergency Medicine

## 2014-02-20 ENCOUNTER — Emergency Department (HOSPITAL_COMMUNITY)
Admission: EM | Admit: 2014-02-20 | Discharge: 2014-02-20 | Disposition: A | Discharge status: Home / Self Care | Payer: BLUE CROSS/BLUE SHIELD | Attending: Emergency Medicine | Admitting: Emergency Medicine

## 2014-02-20 DIAGNOSIS — E785 Hyperlipidemia, unspecified: Secondary | ICD-10-CM | POA: Insufficient documentation

## 2014-02-20 DIAGNOSIS — R220 Localized swelling, mass and lump, head: Secondary | ICD-10-CM | POA: Diagnosis present

## 2014-02-20 DIAGNOSIS — T783XXA Angioneurotic edema, initial encounter: Secondary | ICD-10-CM

## 2014-02-20 DIAGNOSIS — E119 Type 2 diabetes mellitus without complications: Secondary | ICD-10-CM | POA: Insufficient documentation

## 2014-02-20 DIAGNOSIS — Z794 Long term (current) use of insulin: Secondary | ICD-10-CM | POA: Insufficient documentation

## 2014-02-20 DIAGNOSIS — T447X5A Adverse effect of beta-adrenoreceptor antagonists, initial encounter: Secondary | ICD-10-CM | POA: Insufficient documentation

## 2014-02-20 DIAGNOSIS — Z720 Tobacco use: Secondary | ICD-10-CM | POA: Diagnosis not present

## 2014-02-20 DIAGNOSIS — T461X5A Adverse effect of calcium-channel blockers, initial encounter: Secondary | ICD-10-CM | POA: Diagnosis not present

## 2014-02-20 DIAGNOSIS — Z79899 Other long term (current) drug therapy: Secondary | ICD-10-CM | POA: Diagnosis not present

## 2014-02-20 DIAGNOSIS — I10 Essential (primary) hypertension: Secondary | ICD-10-CM | POA: Insufficient documentation

## 2014-02-20 DIAGNOSIS — T464X5A Adverse effect of angiotensin-converting-enzyme inhibitors, initial encounter: Secondary | ICD-10-CM | POA: Insufficient documentation

## 2014-02-20 HISTORY — DX: Hyperlipidemia, unspecified: E78.5

## 2014-02-20 HISTORY — DX: Type 2 diabetes mellitus without complications: E11.9

## 2014-02-20 MED ORDER — DIPHENHYDRAMINE HCL 25 MG PO CAPS
25.0000 mg | ORAL_CAPSULE | Freq: Once | ORAL | Status: AC
  Administered 2014-02-20: 25 mg via ORAL
  Filled 2014-02-20: qty 1

## 2014-02-20 MED ORDER — FAMOTIDINE 20 MG PO TABS
20.0000 mg | ORAL_TABLET | Freq: Once | ORAL | Status: AC
  Administered 2014-02-20: 20 mg via ORAL
  Filled 2014-02-20: qty 1

## 2014-02-20 MED ORDER — PREDNISONE 50 MG PO TABS
60.0000 mg | ORAL_TABLET | Freq: Once | ORAL | Status: AC
  Administered 2014-02-20: 60 mg via ORAL
  Filled 2014-02-20 (×2): qty 1

## 2014-02-20 MED ORDER — DIPHENHYDRAMINE HCL 25 MG PO TABS: 25.0000 mg | ORAL_TABLET | Freq: Four times a day (QID) | ORAL | Status: DC | PRN

## 2014-02-20 MED ORDER — PREDNISONE 20 MG PO TABS: 40.0000 mg | ORAL_TABLET | Freq: Every day | ORAL | Status: DC

## 2014-02-20 NOTE — ED Notes (Signed)
PT reports starting new medication (lisinopril) on 02/16/14 and started having lip swelling x2 days ago. PT denies any SOB or throat swelling.

## 2014-02-20 NOTE — Discharge Instructions (Signed)
Angioedema °Angioedema is a sudden swelling of tissues, often of the skin. It can occur on the face or genitals or in the abdomen or other body parts. The swelling usually develops over a short period and gets better in 24 to 48 hours. It often begins during the night and is found when the person wakes up. The person may also get red, itchy patches of skin (hives). Angioedema can be dangerous if it involves swelling of the air passages.  °Depending on the cause, episodes of angioedema may only happen once, come back in unpredictable patterns, or repeat for several years and then gradually fade away.  °CAUSES  °Angioedema can be caused by an allergic reaction to various triggers. It can also result from nonallergic causes, including reactions to drugs, immune system disorders, viral infections, or an abnormal gene that is passed to you from your parents (hereditary). For some people with angioedema, the cause is unknown.  °Some things that can trigger angioedema include:  °· Foods.   °· Medicines, such as ACE inhibitors, ARBs, nonsteroidal anti-inflammatory agents, or estrogen.   °· Latex.   °· Animal saliva.   °· Insect stings.   °· Dyes used in X-rays.   °· Mild injury.   °· Dental work. °· Surgery. °· Stress.   °· Sudden changes in temperature.   °· Exercise. °SIGNS AND SYMPTOMS  °· Swelling of the skin. °· Hives. If these are present, there is also intense itching. °· Redness in the affected area.   °· Pain in the affected area. °· Swollen lips or tongue. °· Breathing problems. This may happen if the air passages swell. °· Wheezing. °If internal organs are involved, there may be:  °· Nausea.   °· Abdominal pain.   °· Vomiting.   °· Difficulty swallowing.   °· Difficulty passing urine. °DIAGNOSIS  °· Your health care provider will examine the affected area and take a medical and family history. °· Various tests may be done to help determine the cause. Tests may include: °¨ Allergy skin tests to see if the problem  is an allergic reaction.   °¨ Blood tests to check for hereditary angioedema.   °¨ Tests to check for underlying diseases that could cause the condition.   °· A review of your medicines, including over-the-counter medicines, may be done. °TREATMENT  °Treatment will depend on the cause of the angioedema. Possible treatments include:  °· Removal of anything that triggered the condition (such as stopping certain medicines).   °· Medicines to treat symptoms or prevent attacks. Medicines given may include:   °¨ Antihistamines.   °¨ Epinephrine injection.   °¨ Steroids.   °· Hospitalization may be required for severe attacks. If the air passages are affected, it can be an emergency. Tubes may need to be placed to keep the airway open. °HOME CARE INSTRUCTIONS  °· Take all medicines as directed by your health care provider. °· If you were given medicines for emergency allergy treatment, always carry them with you. °· Wear a medical bracelet as directed by your health care provider.   °· Avoid known triggers. °SEEK MEDICAL CARE IF:  °· You have repeat attacks of angioedema.   °· Your attacks are more frequent or more severe despite preventive measures.   °· You have hereditary angioedema and are considering having children. It is important to discuss with your health care provider the risks of passing the condition on to your children. °SEEK IMMEDIATE MEDICAL CARE IF:  °· You have severe swelling of the mouth, tongue, or lips. °· You have difficulty breathing.   °· You have difficulty swallowing.   °· You faint. °MAKE   SURE YOU: °· Understand these instructions. °· Will watch your condition. °· Will get help right away if you are not doing well or get worse. °Document Released: 04/14/2001 Document Revised: 06/20/2013 Document Reviewed: 09/27/2012 °[employer]® Patient Information ©2015 [employer]. This information is not intended to replace advice given to you by your health care provider. Make sure you discuss any questions  you have with your health care provider. ° °

## 2014-02-20 NOTE — ED Notes (Addendum)
Started Carvedilol, metformin, lisinopril, and amlodipine on 02/16/2014.  All these medications are new, first time taking them .  Notice swelling in lips on yesterday and notice tingling to left ring finger and pinkie finger.  Denies any pain of SOB.  Also have swelling to left jaw today.  Pt says right jaw was swollen yesterday.

## 2014-02-20 NOTE — ED Provider Notes (Signed)
CSN: [id number]     Arrival date & time 02/20/14  0940 History  This chart was scribed for [employer]. Alvino Chapel, MD by Stephania Fragmin, ED Scribe. This patient was seen in room APA01/APA01 and the patient's care was started at 10:04 AM.    Chief Complaint  Patient presents with  . Allergic Reaction   The history is provided by the patient and a relative. No language interpreter was used.     HPI Comments: Darren Howard is a 64 y.o. male who recently started Carvedilol, Lisinopril, Metformin, and Amplodipine 4 days ago and presents to the Emergency Department complaining of facial swelling that began yesterday morning when patient woke up. HIs swelling was first noticed on the right, then on his left face. Per sons, his lip swelling has decreased since yesterday, and the facial swelling has decreased in some areas of his face in increased in other areas. This is a new reaction. He denies fever, trouble swallowing, and trouble breathing. Dr. Hilma Favors is his PCP.   Past Medical History  Diagnosis Date  . Hypertension   . Glucose intolerance (impaired glucose tolerance)   . Hyperlipemia   . Diabetes mellitus without complication    Past Surgical History  Procedure Laterality Date  . Cardiac catheterization    . Hernia repair     Family History  Problem Relation Age of Onset  . Congestive Heart Failure Mother   . Congestive Heart Failure Father    History  Substance Use Topics  . Smoking status: Current Every Day Smoker -- 0.50 packs/day for 30 years  . Smokeless tobacco: Not on file  . Alcohol Use: 0.0 oz/week    0 Not specified per week     Comment: 5th per week     Review of Systems  Constitutional: Negative for fever.  HENT: Positive for facial swelling. Negative for trouble swallowing.       Allergies  Review of patient's allergies indicates no known allergies.  Home Medications   Prior to Admission medications   Medication Sig Start Date End Date Taking? Authorizing  Provider  amLODipine (NORVASC) 10 MG tablet Take 1 tablet (10 mg total) by mouth daily. 02/16/14  Yes Nita Sells, MD  carvedilol (COREG) 25 MG tablet Take 1 tablet (25 mg total) by mouth 2 (two) times daily with a meal. 02/16/14  Yes Nita Sells, MD  insulin aspart protamine- aspart (NOVOLOG MIX 70/30) (70-30) 100 UNIT/ML injection Inject 0.25 mLs (25 Units total) into the skin 2 (two) times daily with a meal. 02/16/14  Yes Nita Sells, MD  metFORMIN (GLUCOPHAGE) 500 MG tablet Take 1 tablet (500 mg total) by mouth 2 (two) times daily with a meal. 02/16/14  Yes Nita Sells, MD  acetaminophen (TYLENOL) 500 MG tablet Take 500 mg by mouth every 6 (six) hours as needed for mild pain or moderate pain.    Historical Provider, MD  Blood Glucose-BP Monitor (BLOOD GLUCOSE-WRIST BP MONITOR) DEVI 1 applicator by Does not apply route 4 (four) times daily -  before meals and at bedtime. Patient not taking: Reported on 02/20/2014 02/16/14   Nita Sells, MD  diphenhydrAMINE (BENADRYL) 25 MG tablet Take 1 tablet (25 mg total) by mouth every 6 (six) hours as needed. 02/20/14   Jasper Riling. Caidyn Henricksen, MD  Insulin Syringe-Needle U-100 (INSULIN SYRINGE .5CC/31GX5/16") 31G X 5/16" 0.5 ML MISC 40 each by Does not apply route 2 (two) times daily. Patient not taking: Reported on 02/20/2014 02/16/14   Nita Sells, MD  LANCETS MICRO THIN 33G MISC 30 each by Does not apply route 4 (four) times daily -  before meals and at bedtime. Patient not taking: Reported on 02/20/2014 02/16/14   Nita Sells, MD  predniSONE (DELTASONE) 20 MG tablet Take 2 tablets (40 mg total) by mouth daily. 02/21/14   Jasper Riling. Dashley Monts, MD   BP 126/95 mmHg  Pulse 82  Temp(Src) 98 F (36.7 C) (Oral)  Resp 18  Ht 6\' 1"  (1.854 m)  Wt 223 lb (101.152 kg)  BMI 29.43 kg/m2  SpO2 100% Physical Exam  Constitutional: He is oriented to person, place, and time. He appears well-developed and well-nourished. No  distress.  HENT:  Head: Normocephalic and atraumatic.  Mild angioedema of upper and lower lips, some to both cheeks. No swelling to tongue or floor of mouth. No postpharyngeal edema.  Eyes: Conjunctivae and EOM are normal.  Neck: Neck supple. No tracheal deviation present.  Cardiovascular: Normal rate.   Pulmonary/Chest: Effort normal. No respiratory distress.  Musculoskeletal: Normal range of motion.  Lymphadenopathy:    He has no cervical adenopathy.  Neurological: He is alert and oriented to person, place, and time.  Skin: Skin is warm and dry.  Psychiatric: He has a normal mood and affect. His behavior is normal.  Nursing note and vitals reviewed.   ED Course  Procedures (including critical care time)  DIAGNOSTIC STUDIES: Oxygen Saturation is 100% on room air, normal by my interpretation.    COORDINATION OF CARE: 10:07 AM - Discussed treatment plan with pt at bedside which includes medication, stopping Lisinopril, and f/u with PCP for medication adjustments, and pt agreed to plan.   Labs Review Labs Reviewed - No data to display  Imaging Review No results found.   EKG Interpretation None      MDM   Final diagnoses:  Angioedema, initial encounter   Patient with angioedema. Has been going on since yesterday. Overall relatively stable. Likely due to new lisinopril. Has been stable in the ER. No airway involvement. Will discharge home. Has been given steroids and Benadryl. Will stop lisinopril and will not replace this time and will need to follow-up with primary care as planned or sooner. I personally performed the services described in this documentation, which was scribed in my presence. The recorded information has been reviewed and is accurate.      Jasper Riling. Alvino Chapel, MD 02/20/14 1124

## 2014-03-23 ENCOUNTER — Telehealth: Payer: Self-pay

## 2014-03-23 NOTE — Telephone Encounter (Signed)
Pt called to speak with DS. He had received a letter to set up his colonoscopy. Please call him at [phone]

## 2014-03-23 NOTE — Telephone Encounter (Signed)
Pt called back again to see if DS had tried calling him. I was informed by JL that DS had left for the day. I told patient that DS had to leave and wasn't sure if she was coming back today or not, but she is aware that he had called. Patient doesn't have voice mail and has been in and out today and was just checking to see if she had tried calling.

## 2014-03-31 ENCOUNTER — Encounter: Payer: BLUE CROSS/BLUE SHIELD | Attending: Family Medicine | Admitting: Nutrition

## 2014-04-04 ENCOUNTER — Telehealth: Payer: Self-pay

## 2014-04-04 NOTE — Telephone Encounter (Signed)
DS have you contacted this patient yet? He called twice on 03/23/14 saying that he had received a letter to call you to be set up for colonoscopy.

## 2014-04-05 ENCOUNTER — Other Ambulatory Visit: Payer: Self-pay

## 2014-04-05 ENCOUNTER — Telehealth: Payer: Self-pay

## 2014-04-05 DIAGNOSIS — Z1211 Encounter for screening for malignant neoplasm of colon: Secondary | ICD-10-CM

## 2014-04-05 NOTE — Telephone Encounter (Signed)
See separate triage dated 04/05/2014.

## 2014-04-05 NOTE — Telephone Encounter (Signed)
See triage dated 04/05/2014.

## 2014-04-05 NOTE — Telephone Encounter (Signed)
Please advise on diabetic meds. He is not taking insulin at this time, it was dropping his blood sugar too much.  He is supposed to see Endocrinologist in the near future, but has not been told when yet.

## 2014-04-05 NOTE — Telephone Encounter (Signed)
Gastroenterology Pre-Procedure Review  Request Date: 04/05/2014 Requesting Physician: Rowan Blase, PA  PATIENT REVIEW QUESTIONS: The patient responded to the following health history questions as indicated:    1. Diabetes Melitis: YES 2. Joint replacements in the past 12 months: no 3. Major health problems in the past 3 months: no 4. Has an artificial valve or MVP: no 5. Has a defibrillator: no 6. Has been advised in past to take antibiotics in advance of a procedure like teeth cleaning: no    MEDICATIONS & ALLERGIES:    Patient reports the following regarding taking any blood thinners:   Plavix? no Aspirin? no Coumadin? no  Patient confirms/reports the following medications:  Current Outpatient Prescriptions  Medication Sig Dispense Refill  . acetaminophen (TYLENOL) 500 MG tablet Take 500 mg by mouth every 6 (six) hours as needed for mild pain or moderate pain.    Marland Kitchen amLODipine (NORVASC) 10 MG tablet Take 1 tablet (10 mg total) by mouth daily. 30 tablet 0  . carvedilol (COREG) 25 MG tablet Take 1 tablet (25 mg total) by mouth 2 (two) times daily with a meal. 60 tablet 0  . diphenhydrAMINE (BENADRYL) 25 MG tablet Take 1 tablet (25 mg total) by mouth every 6 (six) hours as needed. 10 tablet 0  . metFORMIN (GLUCOPHAGE) 500 MG tablet Take 1 tablet (500 mg total) by mouth 2 (two) times daily with a meal. 60 tablet 0  . predniSONE (DELTASONE) 20 MG tablet Take 2 tablets (40 mg total) by mouth daily. 4 tablet 0  . Blood Glucose-BP Monitor (BLOOD GLUCOSE-WRIST BP MONITOR) DEVI 1 applicator by Does not apply route 4 (four) times daily -  before meals and at bedtime. (Patient not taking: Reported on 02/20/2014) 1 each 0  . insulin aspart protamine- aspart (NOVOLOG MIX 70/30) (70-30) 100 UNIT/ML injection Inject 0.25 mLs (25 Units total) into the skin 2 (two) times daily with a meal. (Patient not taking: Reported on 04/05/2014) 10 mL 11  . Insulin Syringe-Needle U-100 (INSULIN SYRINGE .5CC/31GX5/16")  31G X 5/16" 0.5 ML MISC 40 each by Does not apply route 2 (two) times daily. (Patient not taking: Reported on 02/20/2014) 80 each 0  . LANCETS MICRO THIN 33G MISC 30 each by Does not apply route 4 (four) times daily -  before meals and at bedtime. (Patient not taking: Reported on 02/20/2014) 90 each 0  . [DISCONTINUED] lisinopril (PRINIVIL,ZESTRIL) 10 MG tablet Take 1 tablet (10 mg total) by mouth daily. 30 tablet 0   No current facility-administered medications for this visit.    Patient confirms/reports the following allergies:  No Known Allergies  No orders of the defined types were placed in this encounter.    AUTHORIZATION INFORMATION Primary Insurance:   ID #:   Group #:  Pre-Cert / Auth required: Pre-Cert / Auth #:   Secondary Insurance:   ID #:   Group #:  Pre-Cert / Auth required: Pre-Cert / Auth #:   SCHEDULE INFORMATION: Procedure has been scheduled as follows:  Date:  05/01/2014               Time: 7:30 AM  Location: [facility]  This Gastroenterology Pre-Precedure Review Form is being routed to the following provider(s): R. Garfield Cornea, MD

## 2014-04-12 NOTE — Telephone Encounter (Signed)
Ok to schedule. Take half dose of Glucophage the night before and none the morning of.

## 2014-04-13 MED ORDER — PEG-KCL-NACL-NASULF-NA ASC-C 100 G PO SOLR: 1.0000 | ORAL | Status: DC

## 2014-04-13 NOTE — Telephone Encounter (Signed)
Rx sent to the pharmacy and instructions mailed to pt.  

## 2014-04-24 ENCOUNTER — Telehealth: Payer: Self-pay

## 2014-04-24 NOTE — Telephone Encounter (Signed)
PATIENT CALLED TO RESCHEDULE HIS COLONOSCOPY DUE TO HAVING A RIDE

## 2014-04-26 NOTE — Telephone Encounter (Signed)
Pt just wants to cancel his colonoscopy on 05/01/2014 due to no transportation. He has a lot going on now, and will call when he is ready to schedule. Maudie Mercury is aware in Endo.

## 2014-05-01 ENCOUNTER — Encounter (HOSPITAL_COMMUNITY): Admission: RE | Payer: Self-pay | Source: Ambulatory Visit

## 2014-05-01 ENCOUNTER — Ambulatory Visit (HOSPITAL_COMMUNITY)
Admission: RE | Admit: 2014-05-01 | Payer: BLUE CROSS/BLUE SHIELD | Source: Ambulatory Visit | Admitting: Internal Medicine

## 2014-05-01 SURGERY — COLONOSCOPY
Anesthesia: Moderate Sedation

## 2017-03-29 ENCOUNTER — Other Ambulatory Visit: Payer: Self-pay

## 2017-03-29 ENCOUNTER — Emergency Department (HOSPITAL_COMMUNITY)
Admission: EM | Admit: 2017-03-29 | Discharge: 2017-03-29 | Disposition: A | Discharge status: Home / Self Care | Payer: BLUE CROSS/BLUE SHIELD | Attending: Emergency Medicine | Admitting: Emergency Medicine

## 2017-03-29 ENCOUNTER — Encounter (HOSPITAL_COMMUNITY): Payer: Self-pay | Admitting: Emergency Medicine

## 2017-03-29 DIAGNOSIS — Z79899 Other long term (current) drug therapy: Secondary | ICD-10-CM | POA: Diagnosis not present

## 2017-03-29 DIAGNOSIS — R21 Rash and other nonspecific skin eruption: Secondary | ICD-10-CM | POA: Insufficient documentation

## 2017-03-29 DIAGNOSIS — R200 Anesthesia of skin: Secondary | ICD-10-CM | POA: Diagnosis not present

## 2017-03-29 DIAGNOSIS — E860 Dehydration: Secondary | ICD-10-CM | POA: Insufficient documentation

## 2017-03-29 DIAGNOSIS — F17210 Nicotine dependence, cigarettes, uncomplicated: Secondary | ICD-10-CM | POA: Insufficient documentation

## 2017-03-29 DIAGNOSIS — E119 Type 2 diabetes mellitus without complications: Secondary | ICD-10-CM | POA: Insufficient documentation

## 2017-03-29 DIAGNOSIS — I10 Essential (primary) hypertension: Secondary | ICD-10-CM | POA: Diagnosis not present

## 2017-03-29 LAB — CBC WITH DIFFERENTIAL/PLATELET
BASOS PCT: 0 %
Basophils Absolute: 0 10*3/uL (ref 0.0–0.1)
EOS ABS: 0.1 10*3/uL (ref 0.0–0.7)
EOS PCT: 1 %
HCT: 37.1 % — ABNORMAL LOW (ref 39.0–52.0)
Hemoglobin: 12.1 g/dL — ABNORMAL LOW (ref 13.0–17.0)
Lymphocytes Relative: 16 %
Lymphs Abs: 1.2 10*3/uL (ref 0.7–4.0)
MCH: 28.4 pg (ref 26.0–34.0)
MCHC: 32.6 g/dL (ref 30.0–36.0)
MCV: 87.1 fL (ref 78.0–100.0)
Monocytes Absolute: 1 10*3/uL (ref 0.1–1.0)
Monocytes Relative: 13 %
Neutro Abs: 5.2 10*3/uL (ref 1.7–7.7)
Neutrophils Relative %: 70 %
Platelets: 413 10*3/uL — ABNORMAL HIGH (ref 150–400)
RBC: 4.26 MIL/uL (ref 4.22–5.81)
RDW: 15.7 % — ABNORMAL HIGH (ref 11.5–15.5)
WBC: 7.4 10*3/uL (ref 4.0–10.5)

## 2017-03-29 LAB — COMPREHENSIVE METABOLIC PANEL
ALBUMIN: 3.8 g/dL (ref 3.5–5.0)
ALT: 33 U/L (ref 17–63)
AST: 65 U/L — ABNORMAL HIGH (ref 15–41)
Alkaline Phosphatase: 94 U/L (ref 38–126)
Anion gap: 15 (ref 5–15)
BUN: 22 mg/dL — ABNORMAL HIGH (ref 6–20)
CO2: 24 mmol/L (ref 22–32)
Calcium: 9.2 mg/dL (ref 8.9–10.3)
Chloride: 93 mmol/L — ABNORMAL LOW (ref 101–111)
Creatinine, Ser: 1.42 mg/dL — ABNORMAL HIGH (ref 0.61–1.24)
GFR calc non Af Amer: 51 mL/min — ABNORMAL LOW (ref >60)
GFR, EST AFRICAN AMERICAN: 59 mL/min — AB (ref >60)
GLUCOSE: 218 mg/dL — AB (ref 65–99)
Potassium: 3.4 mmol/L — ABNORMAL LOW (ref 3.5–5.1)
SODIUM: 132 mmol/L — AB (ref 135–145)
Total Bilirubin: 0.6 mg/dL (ref 0.3–1.2)
Total Protein: 7.9 g/dL (ref 6.5–8.1)

## 2017-03-29 LAB — ETHANOL: Alcohol, Ethyl (B): <10 mg/dL (ref <10)

## 2017-03-29 LAB — CBG MONITORING, ED: GLUCOSE-CAPILLARY: 154 mg/dL — AB (ref 65–99)

## 2017-03-29 LAB — MAGNESIUM: Magnesium: 1.8 mg/dL (ref 1.7–2.4)

## 2017-03-29 MED ORDER — MICONAZOLE NITRATE 2 % EX CREA: 1.0000 "application " | TOPICAL_CREAM | Freq: Two times a day (BID) | CUTANEOUS | 0 refills | Status: DC

## 2017-03-29 MED ORDER — FOLIC ACID 1 MG PO TABS
1.0000 mg | ORAL_TABLET | Freq: Once | ORAL | Status: AC
  Administered 2017-03-29: 1 mg via ORAL
  Filled 2017-03-29: qty 1

## 2017-03-29 MED ORDER — SODIUM CHLORIDE 0.9 % IV BOLUS (SEPSIS)
1000.0000 mL | Freq: Once | INTRAVENOUS | Status: AC
  Administered 2017-03-29: 1000 mL via INTRAVENOUS

## 2017-03-29 MED ORDER — METFORMIN HCL 500 MG PO TABS: 500.0000 mg | ORAL_TABLET | Freq: Two times a day (BID) | ORAL | 0 refills | Status: DC

## 2017-03-29 MED ORDER — VITAMIN B-1 100 MG PO TABS
100.0000 mg | ORAL_TABLET | Freq: Once | ORAL | Status: AC
  Administered 2017-03-29: 100 mg via ORAL
  Filled 2017-03-29: qty 1

## 2017-03-29 NOTE — ED Notes (Signed)
Pt is now undressed and ready for examination by the doctor

## 2017-03-29 NOTE — ED Triage Notes (Addendum)
Patient c/o rash to entire body he has had for "a while." Per patient was seen by Urgent Care x1 month ago and given antibiotic and is supposed to follow-up with dermatologist. Patient denies any improvement in rash with medication. Per patient itching. Patient also states he has numbness in his feet bilaterally x1 week and generalized weakness. Patient states that he hasn't eaten in a couple days. Patient is diabetic and has not checked blood sugar. Patient also states he has not taken medication for blood sugar x1 year.

## 2017-03-29 NOTE — ED Provider Notes (Signed)
[facility] EMERGENCY DEPARTMENT Provider Note   CSN: [id number] Arrival date & time: 03/29/17  1020     History   Chief Complaint Chief Complaint  Patient presents with  . Rash    HPI Darren Howard is a 67 y.o. male.  HPI  Patient presents with 2 sons who assist with the HPI. Patient acknowledges history of insulin dependent diabetes, hypertension, states that he takes no medication regularly including insulin. He also acknowledges drinking approximately half a gallon of alcohol weekly. Now presents due to concern of ongoing rash, as well as tingling in his feet. Some associated lightheadedness, but no overt confusion, no syncope, no chest or abdominal pain, no vomiting, no diarrhea.   Past Medical History:  Diagnosis Date  . Diabetes mellitus without complication ([place])   . Glucose intolerance (impaired glucose tolerance)   . Hyperlipemia   . Hypertension     Patient Active Problem List   Diagnosis Date Noted  . Epistaxis 02/13/2014  . Hypertension 02/13/2014  . Anemia 02/13/2014  . DM2 (diabetes mellitus, type 2) ([place]) 02/13/2014  . Tachycardia 02/13/2014  . Hypertensive urgency     Past Surgical History:  Procedure Laterality Date  . CARDIAC CATHETERIZATION    . HERNIA REPAIR         Home Medications    Prior to Admission medications   Medication Sig Start Date End Date Taking? Authorizing Provider  lisinopril (PRINIVIL,ZESTRIL) 10 MG tablet Take 1 tablet (10 mg total) by mouth daily. 02/16/14 02/20/14  Nita Sells, MD    Family History Family History  Problem Relation Age of Onset  . Congestive Heart Failure Mother   . Congestive Heart Failure Father     Social History Social History   Tobacco Use  . Smoking status: Current Every Day Smoker    Packs/day: 0.50    Years: 30.00    Pack years: 15.00    Types: Cigarettes  . Smokeless tobacco: Never Used  Substance Use Topics  . Alcohol use: Yes    Alcohol/week: 0.0 oz    Comment:  5th per week   . Drug use: No     Allergies   Patient has no known allergies.   Review of Systems Review of Systems  Constitutional:       Per HPI, otherwise negative  HENT:       Per HPI, otherwise negative  Respiratory:       Per HPI, otherwise negative  Cardiovascular:       Per HPI, otherwise negative  Gastrointestinal: Negative for vomiting.  Endocrine:       Negative aside from HPI  Genitourinary:       Neg aside from HPI   Musculoskeletal:       Per HPI, otherwise negative  Skin: Positive for rash.  Neurological: Positive for numbness. Negative for syncope.     Physical Exam Updated Vital Signs BP (!) 171/106 (BP Location: Left Arm)   Pulse 81   Temp 97.8 F (36.6 C) (Oral)   Resp 14   Ht 6\' 2"  (1.88 m)   Wt 90.7 kg (200 lb)   SpO2 100%   BMI 25.68 kg/m   Physical Exam  Constitutional: He is oriented to person, place, and time. He appears well-developed. No distress.  HENT:  Head: Normocephalic and atraumatic.  Eyes: Conjunctivae and EOM are normal.  Cardiovascular: Normal rate, regular rhythm and normal pulses.  Appreciable pulses in both feet on the dorsalis pedis, cap refill is appropriate  Pulmonary/Chest: Effort normal. No stridor. No respiratory distress.  Abdominal: He exhibits no distension.  Musculoskeletal: He exhibits no edema.  Neurological: He is alert and oriented to person, place, and time.  Skin: Skin is warm and dry. Rash noted.  There is a diffuse rash on all extremities, slightly sclerotic, raised, with irregular margins, no bleeding visible, no discharge, drainage, no erythema  Psychiatric: He has a normal mood and affect.  Nursing note and vitals reviewed.    ED Treatments / Results  Labs (all labs ordered are listed, but only abnormal results are displayed) Labs Reviewed  CBC WITH DIFFERENTIAL/PLATELET - Abnormal; Notable for the following components:      Result Value   Hemoglobin 12.1 (*)    HCT 37.1 (*)    RDW 15.7  (*)    Platelets 413 (*)    All other components within normal limits  COMPREHENSIVE METABOLIC PANEL - Abnormal; Notable for the following components:   Sodium 132 (*)    Potassium 3.4 (*)    Chloride 93 (*)    Glucose, Bld 218 (*)    BUN 22 (*)    Creatinine, Ser 1.42 (*)    AST 65 (*)    GFR calc non Af Amer 51 (*)    GFR calc Af Amer 59 (*)    All other components within normal limits  CBG MONITORING, ED - Abnormal; Notable for the following components:   Glucose-Capillary 154 (*)    All other components within normal limits  ETHANOL  MAGNESIUM    Procedures Procedures (including critical care time)  Medications Ordered in ED Medications  sodium chloride 0.9 % bolus 1,000 mL (0 mLs Intravenous Stopped 03/29/17 1720)  thiamine (VITAMIN B-1) tablet 100 mg (100 mg Oral Given 8/41/32 4401)  folic acid (FOLVITE) tablet 1 mg (1 mg Oral Given 03/29/17 1618)  sodium chloride 0.9 % bolus 1,000 mL (1,000 mLs Intravenous New Bag/Given 03/29/17 1814)     Initial Impression / Assessment and Plan / ED Course  I have reviewed the triage vital signs and the nursing notes.  Pertinent labs & imaging results that were available during my care of the patient were reviewed by me and considered in my medical decision making (see chart for details).     7:38 PM Now following 2 L fluid resuscitation, thiamine, folic acid repletion, the patient states that he feels better. I had a lengthy conversation with him and his son about need to re-visit primary care for appropriate ongoing management of his diabetes, alcohol use, and a rash. Given the patient's inconsistent involvement with primary care, he will be started on metformin, as he has no access to insulin, nor continuous medical involvement to appropriately manage this medication. Patient will also start a antifungal medication for his rash, with consideration of yeast etiology, though other considerations remain, and the patient and his son  both acknowledge that he needs to follow-up with dermatology for appropriate ongoing care.  With otherwise reassuring vitals, physical exam, no hemodynamic instability, only mild lab evidence for dehydration, now following repletion, the patient is appropriate for discharge, and with the assistance of his son, will attempt to reengage with ongoing primary care.   Final Clinical Impressions(s) / ED Diagnoses  Numbness Dehydration Rash   Carmin Muskrat, MD 03/29/17 1942

## 2017-03-29 NOTE — Discharge Instructions (Signed)
As discussed, today's evaluation has been generally reassuring. It is important to vitamin daily, if you continue to drink alcohol.  Equally important is that he be sure to follow-up with a primary care physician, and hopefully a dermatologist. Please use the prescribed cream for your rash, and take your prescribed metformin as directed.  Return here for concerning changes in your condition.

## 2018-03-22 DIAGNOSIS — E1165 Type 2 diabetes mellitus with hyperglycemia: Secondary | ICD-10-CM | POA: Diagnosis not present

## 2018-03-22 DIAGNOSIS — Z6827 Body mass index (BMI) 27.0-27.9, adult: Secondary | ICD-10-CM | POA: Diagnosis not present

## 2018-03-22 DIAGNOSIS — E7849 Other hyperlipidemia: Secondary | ICD-10-CM | POA: Diagnosis not present

## 2018-03-22 DIAGNOSIS — I10 Essential (primary) hypertension: Secondary | ICD-10-CM | POA: Diagnosis not present

## 2018-03-23 DIAGNOSIS — Z6827 Body mass index (BMI) 27.0-27.9, adult: Secondary | ICD-10-CM | POA: Diagnosis not present

## 2018-03-23 DIAGNOSIS — E663 Overweight: Secondary | ICD-10-CM | POA: Diagnosis not present

## 2018-03-23 DIAGNOSIS — Z1389 Encounter for screening for other disorder: Secondary | ICD-10-CM | POA: Diagnosis not present

## 2018-04-18 DIAGNOSIS — K922 Gastrointestinal hemorrhage, unspecified: Secondary | ICD-10-CM

## 2018-04-18 HISTORY — DX: Gastrointestinal hemorrhage, unspecified: K92.2

## 2018-05-13 ENCOUNTER — Inpatient Hospital Stay (HOSPITAL_COMMUNITY)
Admission: EM | Admit: 2018-05-13 | Discharge: 2018-05-18 | DRG: 378 | Disposition: A | Discharge status: Skilled Nursing Facility | Payer: Medicare Other | Attending: Internal Medicine | Admitting: Internal Medicine

## 2018-05-13 ENCOUNTER — Emergency Department (HOSPITAL_COMMUNITY): Payer: Medicare Other

## 2018-05-13 ENCOUNTER — Other Ambulatory Visit: Payer: Self-pay

## 2018-05-13 ENCOUNTER — Encounter (HOSPITAL_COMMUNITY): Payer: Self-pay

## 2018-05-13 DIAGNOSIS — K254 Chronic or unspecified gastric ulcer with hemorrhage: Principal | ICD-10-CM | POA: Diagnosis present

## 2018-05-13 DIAGNOSIS — T39395A Adverse effect of other nonsteroidal anti-inflammatory drugs [NSAID], initial encounter: Secondary | ICD-10-CM | POA: Diagnosis not present

## 2018-05-13 DIAGNOSIS — R270 Ataxia, unspecified: Secondary | ICD-10-CM

## 2018-05-13 DIAGNOSIS — K2951 Unspecified chronic gastritis with bleeding: Secondary | ICD-10-CM | POA: Diagnosis not present

## 2018-05-13 DIAGNOSIS — R05 Cough: Secondary | ICD-10-CM | POA: Diagnosis not present

## 2018-05-13 DIAGNOSIS — I129 Hypertensive chronic kidney disease with stage 1 through stage 4 chronic kidney disease, or unspecified chronic kidney disease: Secondary | ICD-10-CM | POA: Diagnosis present

## 2018-05-13 DIAGNOSIS — E119 Type 2 diabetes mellitus without complications: Secondary | ICD-10-CM

## 2018-05-13 DIAGNOSIS — R000 Tachycardia, unspecified: Secondary | ICD-10-CM | POA: Diagnosis not present

## 2018-05-13 DIAGNOSIS — K319 Disease of stomach and duodenum, unspecified: Secondary | ICD-10-CM | POA: Diagnosis present

## 2018-05-13 DIAGNOSIS — R195 Other fecal abnormalities: Secondary | ICD-10-CM | POA: Diagnosis not present

## 2018-05-13 DIAGNOSIS — Z79899 Other long term (current) drug therapy: Secondary | ICD-10-CM

## 2018-05-13 DIAGNOSIS — N183 Chronic kidney disease, stage 3 (moderate): Secondary | ICD-10-CM | POA: Diagnosis present

## 2018-05-13 DIAGNOSIS — F17210 Nicotine dependence, cigarettes, uncomplicated: Secondary | ICD-10-CM | POA: Diagnosis not present

## 2018-05-13 DIAGNOSIS — E1165 Type 2 diabetes mellitus with hyperglycemia: Secondary | ICD-10-CM | POA: Diagnosis not present

## 2018-05-13 DIAGNOSIS — E1122 Type 2 diabetes mellitus with diabetic chronic kidney disease: Secondary | ICD-10-CM | POA: Diagnosis present

## 2018-05-13 DIAGNOSIS — K269 Duodenal ulcer, unspecified as acute or chronic, without hemorrhage or perforation: Secondary | ICD-10-CM | POA: Diagnosis not present

## 2018-05-13 DIAGNOSIS — Z9114 Patient's other noncompliance with medication regimen: Secondary | ICD-10-CM | POA: Diagnosis not present

## 2018-05-13 DIAGNOSIS — I6782 Cerebral ischemia: Secondary | ICD-10-CM | POA: Diagnosis not present

## 2018-05-13 DIAGNOSIS — K3189 Other diseases of stomach and duodenum: Secondary | ICD-10-CM | POA: Diagnosis not present

## 2018-05-13 DIAGNOSIS — D62 Acute posthemorrhagic anemia: Secondary | ICD-10-CM | POA: Diagnosis present

## 2018-05-13 DIAGNOSIS — E871 Hypo-osmolality and hyponatremia: Secondary | ICD-10-CM | POA: Diagnosis present

## 2018-05-13 DIAGNOSIS — R739 Hyperglycemia, unspecified: Secondary | ICD-10-CM

## 2018-05-13 DIAGNOSIS — Z7984 Long term (current) use of oral hypoglycemic drugs: Secondary | ICD-10-CM | POA: Diagnosis not present

## 2018-05-13 DIAGNOSIS — R42 Dizziness and giddiness: Secondary | ICD-10-CM | POA: Diagnosis not present

## 2018-05-13 DIAGNOSIS — E785 Hyperlipidemia, unspecified: Secondary | ICD-10-CM | POA: Diagnosis present

## 2018-05-13 DIAGNOSIS — D649 Anemia, unspecified: Secondary | ICD-10-CM | POA: Diagnosis not present

## 2018-05-13 DIAGNOSIS — E860 Dehydration: Secondary | ICD-10-CM | POA: Diagnosis not present

## 2018-05-13 DIAGNOSIS — R1013 Epigastric pain: Secondary | ICD-10-CM | POA: Diagnosis not present

## 2018-05-13 DIAGNOSIS — I10 Essential (primary) hypertension: Secondary | ICD-10-CM | POA: Diagnosis present

## 2018-05-13 LAB — CBC WITH DIFFERENTIAL/PLATELET
Abs Immature Granulocytes: 0.06 10*3/uL (ref 0.00–0.07)
BASOS ABS: 0 10*3/uL (ref 0.0–0.1)
Basophils Relative: 0 %
Eosinophils Absolute: 0 10*3/uL (ref 0.0–0.5)
Eosinophils Relative: 0 %
HCT: 22.7 % — ABNORMAL LOW (ref 39.0–52.0)
Hemoglobin: 7.7 g/dL — ABNORMAL LOW (ref 13.0–17.0)
Immature Granulocytes: 1 %
Lymphocytes Relative: 8 %
Lymphs Abs: 0.7 10*3/uL (ref 0.7–4.0)
MCH: 32.2 pg (ref 26.0–34.0)
MCHC: 33.9 g/dL (ref 30.0–36.0)
MCV: 95 fL (ref 80.0–100.0)
Monocytes Absolute: 0.7 10*3/uL (ref 0.1–1.0)
Monocytes Relative: 8 %
NEUTROS ABS: 7.2 10*3/uL (ref 1.7–7.7)
Neutrophils Relative %: 83 %
Platelets: 305 10*3/uL (ref 150–400)
RBC: 2.39 MIL/uL — ABNORMAL LOW (ref 4.22–5.81)
RDW: 15.8 % — ABNORMAL HIGH (ref 11.5–15.5)
WBC: 8.7 10*3/uL (ref 4.0–10.5)
nRBC: 0.2 % (ref 0.0–0.2)

## 2018-05-13 LAB — IRON AND TIBC
Iron: 151 ug/dL (ref 45–182)
Saturation Ratios: 86 % — ABNORMAL HIGH (ref 17.9–39.5)
TIBC: 176 ug/dL — ABNORMAL LOW (ref 250–450)
UIBC: 25 ug/dL

## 2018-05-13 LAB — COMPREHENSIVE METABOLIC PANEL
ALBUMIN: 3.5 g/dL (ref 3.5–5.0)
ALT: 24 U/L (ref 0–44)
AST: 57 U/L — ABNORMAL HIGH (ref 15–41)
Alkaline Phosphatase: 112 U/L (ref 38–126)
Anion gap: 16 — ABNORMAL HIGH (ref 5–15)
BUN: 30 mg/dL — AB (ref 8–23)
CO2: 21 mmol/L — ABNORMAL LOW (ref 22–32)
Calcium: 8.4 mg/dL — ABNORMAL LOW (ref 8.9–10.3)
Chloride: 93 mmol/L — ABNORMAL LOW (ref 98–111)
Creatinine, Ser: 1.58 mg/dL — ABNORMAL HIGH (ref 0.61–1.24)
GFR calc Af Amer: 52 mL/min — ABNORMAL LOW (ref >60)
GFR calc non Af Amer: 45 mL/min — ABNORMAL LOW (ref >60)
GLUCOSE: 373 mg/dL — AB (ref 70–99)
Potassium: 3.7 mmol/L (ref 3.5–5.1)
SODIUM: 130 mmol/L — AB (ref 135–145)
Total Bilirubin: 1 mg/dL (ref 0.3–1.2)
Total Protein: 6.9 g/dL (ref 6.5–8.1)

## 2018-05-13 LAB — CBG MONITORING, ED: Glucose-Capillary: 360 mg/dL — ABNORMAL HIGH (ref 70–99)

## 2018-05-13 LAB — HEMOGLOBIN A1C
Hgb A1c MFr Bld: 6.9 % — ABNORMAL HIGH (ref 4.8–5.6)
Mean Plasma Glucose: 151.33 mg/dL

## 2018-05-13 LAB — FERRITIN: Ferritin: 1005 ng/mL — ABNORMAL HIGH (ref 24–336)

## 2018-05-13 LAB — GLUCOSE, CAPILLARY
GLUCOSE-CAPILLARY: 214 mg/dL — AB (ref 70–99)
Glucose-Capillary: 150 mg/dL — ABNORMAL HIGH (ref 70–99)

## 2018-05-13 LAB — VITAMIN B12: Vitamin B-12: 489 pg/mL (ref 180–914)

## 2018-05-13 LAB — RETICULOCYTES
Immature Retic Fract: 0 % — ABNORMAL LOW (ref 2.3–15.9)
RBC.: 2.35 MIL/uL — ABNORMAL LOW (ref 4.22–5.81)
Retic Count, Absolute: 6.3 10*3/uL — ABNORMAL LOW (ref 19.0–186.0)
Retic Ct Pct: 0.3 % — ABNORMAL LOW (ref 0.4–3.1)

## 2018-05-13 LAB — TROPONIN I: Troponin I: <0.03 ng/mL (ref <0.03)

## 2018-05-13 MED ORDER — SODIUM CHLORIDE 0.9 % IV BOLUS: 500.0000 mL | Freq: Once | INTRAVENOUS | Status: DC

## 2018-05-13 MED ORDER — INSULIN ASPART 100 UNIT/ML ~~LOC~~ SOLN
0.0000 [IU] | Freq: Three times a day (TID) | SUBCUTANEOUS | Status: DC
  Administered 2018-05-13: 5 [IU] via SUBCUTANEOUS
  Administered 2018-05-14 (×3): 3 [IU] via SUBCUTANEOUS
  Administered 2018-05-15 (×2): 2 [IU] via SUBCUTANEOUS
  Administered 2018-05-15: 3 [IU] via SUBCUTANEOUS
  Administered 2018-05-16: 5 [IU] via SUBCUTANEOUS
  Administered 2018-05-16 – 2018-05-17 (×2): 3 [IU] via SUBCUTANEOUS
  Administered 2018-05-18: 5 [IU] via SUBCUTANEOUS

## 2018-05-13 MED ORDER — SODIUM CHLORIDE 0.9 % IV BOLUS
500.0000 mL | Freq: Once | INTRAVENOUS | Status: AC
  Administered 2018-05-13: 500 mL via INTRAVENOUS

## 2018-05-13 MED ORDER — ACETAMINOPHEN 325 MG PO TABS
650.0000 mg | ORAL_TABLET | Freq: Four times a day (QID) | ORAL | Status: DC | PRN
  Administered 2018-05-13: 650 mg via ORAL
  Filled 2018-05-13: qty 2

## 2018-05-13 MED ORDER — ONDANSETRON HCL 4 MG/2ML IJ SOLN
4.0000 mg | Freq: Four times a day (QID) | INTRAMUSCULAR | Status: DC | PRN
  Administered 2018-05-13: 4 mg via INTRAVENOUS
  Filled 2018-05-13: qty 2

## 2018-05-13 MED ORDER — ONDANSETRON HCL 4 MG PO TABS: 4.0000 mg | ORAL_TABLET | Freq: Four times a day (QID) | ORAL | Status: DC | PRN

## 2018-05-13 MED ORDER — INSULIN ASPART 100 UNIT/ML ~~LOC~~ SOLN: 0.0000 [IU] | Freq: Every day | SUBCUTANEOUS | Status: DC

## 2018-05-13 MED ORDER — ACETAMINOPHEN 650 MG RE SUPP: 650.0000 mg | Freq: Four times a day (QID) | RECTAL | Status: DC | PRN

## 2018-05-13 MED ORDER — INSULIN ASPART 100 UNIT/ML IV SOLN
10.0000 [IU] | Freq: Once | INTRAVENOUS | Status: AC
  Administered 2018-05-13: 10 [IU] via INTRAVENOUS

## 2018-05-13 MED ORDER — SODIUM CHLORIDE 0.9 % IV SOLN
INTRAVENOUS | Status: DC
  Administered 2018-05-13 – 2018-05-17 (×4): via INTRAVENOUS

## 2018-05-13 NOTE — H&P (Addendum)
History and Physical    Darren Howard ACZ:[id number] DOB: 09-17-1950 DOA: 05/13/2018  PCP: Patient, No Pcp Per   Patient coming from: Home  Chief Complaint: Weakness and dizziness  HPI: Darren Howard is a 68 y.o. male with medical history significant for type 2 diabetes with medication noncompliance, dyslipidemia, hypertension, and alcohol abuse who presented to the ED with complaints of weakness and dizziness that has been ongoing for the last 2 to 3 weeks.  As a result, he cannot ambulate very well and typically crawls around.  He states that he feels that the room is spinning and has had some mild nausea, but no vomiting.  He denies any fevers or chills, chest pain, or shortness of breath.  He denies any presyncopal symptoms and is otherwise a poor historian.  No trouble with speech or swallowing noted.  He called EMS for evaluation in the ED per his son's recommendation today.  Patient apparently has not been taking his home medications as prescribed either.  He denies any blood in his stools or black tarry stools.   ED Course: Vital signs are stable with some mild tachycardia noted.  Temperature 98.1.  Laboratory data with glucose of 373 and sodium 130 noted.  Hemoglobin noted to be 7.7 with baseline previously of 12.  Creatinine at 1.58 with BUN of 30 (prior 1.4) consistent with CKD stage III.  Head CT with no acute findings noted and brain MRI is currently pending.  Chest x-ray with no acute findings.  Review of Systems: All others reviewed and otherwise negative.  Past Medical History:  Diagnosis Date   Diabetes mellitus without complication (HCC)    Glucose intolerance (impaired glucose tolerance)    Hyperlipemia    Hypertension     Past Surgical History:  Procedure Laterality Date   CARDIAC CATHETERIZATION     HERNIA REPAIR       reports that he has been smoking cigarettes. He has a 15.00 pack-year smoking history. He has never used smokeless tobacco. He reports current  alcohol use. He reports that he does not use drugs.  No Known Allergies  Family History  Problem Relation Age of Onset   Congestive Heart Failure Mother    Congestive Heart Failure Father     Prior to Admission medications   Medication Sig Start Date End Date Taking? Authorizing Provider  metFORMIN (GLUCOPHAGE) 500 MG tablet Take 1 tablet (500 mg total) by mouth 2 (two) times daily. 03/29/17   Carmin Muskrat, MD  miconazole (MICOTIN) 2 % cream Apply 1 application topically 2 (two) times daily. 03/29/17   Carmin Muskrat, MD    Physical Exam: Vitals:   05/13/18 1332 05/13/18 1333 05/13/18 1400 05/13/18 1430  BP: (!) 141/87  137/77 128/84  Pulse: (!) 113 (!) 112 (!) 108   Resp: 16 18 18 14   Temp:      TempSrc:      SpO2: 100% 100% 99%   Height:        Constitutional: NAD, calm, comfortable Vitals:   05/13/18 1332 05/13/18 1333 05/13/18 1400 05/13/18 1430  BP: (!) 141/87  137/77 128/84  Pulse: (!) 113 (!) 112 (!) 108   Resp: 16 18 18 14   Temp:      TempSrc:      SpO2: 100% 100% 99%   Height:       Eyes: lids and conjunctivae normal ENMT: Mucous membranes are moist.  Neck: normal, supple Respiratory: clear to auscultation bilaterally. Normal respiratory effort. No accessory muscle  use.  Cardiovascular: Regular rate and rhythm, no murmurs. No extremity edema. Abdomen: no tenderness, no distention. Bowel sounds positive.  Musculoskeletal:  No joint deformity upper and lower extremities.   Skin: no rashes, lesions, ulcers.  Psychiatric: Normal judgment and insight. Alert and oriented x 3. Normal mood.   Labs on Admission: I have personally reviewed following labs and imaging studies  CBC: Recent Labs  Lab 05/13/18 1338  WBC 8.7  NEUTROABS 7.2  HGB 7.7*  HCT 22.7*  MCV 95.0  PLT 268   Basic Metabolic Panel: Recent Labs  Lab 05/13/18 1338  NA 130*  K 3.7  CL 93*  CO2 21*  GLUCOSE 373*  BUN 30*  CREATININE 1.58*  CALCIUM 8.4*   GFR: CrCl cannot be  calculated (Unknown ideal weight.). Liver Function Tests: Recent Labs  Lab 05/13/18 1338  AST 57*  ALT 24  ALKPHOS 112  BILITOT 1.0  PROT 6.9  ALBUMIN 3.5   No results for input(s): LIPASE, AMYLASE in the last 168 hours. No results for input(s): AMMONIA in the last 168 hours. Coagulation Profile: No results for input(s): INR, PROTIME in the last 168 hours. Cardiac Enzymes: Recent Labs  Lab 05/13/18 1338  TROPONINI <0.03   BNP (last 3 results) No results for input(s): PROBNP in the last 8760 hours. HbA1C: No results for input(s): HGBA1C in the last 72 hours. CBG: Recent Labs  Lab 05/13/18 1311  GLUCAP 360*   Lipid Profile: No results for input(s): CHOL, HDL, LDLCALC, TRIG, CHOLHDL, LDLDIRECT in the last 72 hours. Thyroid Function Tests: No results for input(s): TSH, T4TOTAL, FREET4, T3FREE, THYROIDAB in the last 72 hours. Anemia Panel: Recent Labs    05/13/18 1330  RETICCTPCT 0.3*   Urine analysis:    Component Value Date/Time   COLORURINE YELLOW 06/14/2008 1930   APPEARANCEUR CLEAR 06/14/2008 1930   LABSPEC >1.030 (H) 06/14/2008 1930   PHURINE 5.5 06/14/2008 1930   GLUCOSEU NEGATIVE 06/14/2008 1930   HGBUR NEGATIVE 06/14/2008 1930   BILIRUBINUR MODERATE (A) 06/14/2008 1930   KETONESUR 15 (A) 06/14/2008 1930   PROTEINUR 30 (A) 06/14/2008 1930   UROBILINOGEN 0.2 06/14/2008 1930   NITRITE NEGATIVE 06/14/2008 1930   LEUKOCYTESUR NEGATIVE 06/14/2008 1930    Radiological Exams on Admission: Dg Chest 2 View  Result Date: 05/13/2018 CLINICAL DATA:  Cough EXAM: CHEST - 2 VIEW COMPARISON:  07/17/2006 FINDINGS: The heart size and mediastinal contours are within normal limits. Both lungs are clear. The visualized skeletal structures are unremarkable. IMPRESSION: No active cardiopulmonary disease. Electronically Signed   By: Franchot Gallo M.D.   On: 05/13/2018 14:22   Ct Head Wo Contrast  Result Date: 05/13/2018 CLINICAL DATA:  Weakness and dizziness for several  weeks EXAM: CT HEAD WITHOUT CONTRAST TECHNIQUE: Contiguous axial images were obtained from the base of the skull through the vertex without intravenous contrast. COMPARISON:  None. FINDINGS: Brain: Mild atrophic changes and chronic white matter ischemic change is identified. No findings to suggest acute hemorrhage, acute infarction or space-occupying mass lesion seen. Few scattered lacunar infarcts are noted in the basal ganglia and thalami bilaterally. Vascular: No hyperdense vessel or unexpected calcification. Skull: Normal. Negative for fracture or focal lesion. Sinuses/Orbits: No acute finding. Other: None. IMPRESSION: Chronic atrophic and ischemic changes without acute abnormality. Electronically Signed   By: Inez Catalina M.D.   On: 05/13/2018 14:18   Mr Brain Wo Contrast  Result Date: 05/13/2018 CLINICAL DATA:  Three-week history of dizziness and gait disturbance. EXAM: MRI HEAD  WITHOUT CONTRAST TECHNIQUE: Multiplanar, multiecho pulse sequences of the brain and surrounding structures were obtained without intravenous contrast. COMPARISON:  Head CT 05/13/2018 FINDINGS: Brain: Diffusion imaging does not show any acute or subacute infarction. The brainstem and cerebellum are normal. There is an old lacunar infarction in the right thalamus and in both basal ganglia and radiating white matter tracts. No large vessel territory infarction. No mass lesion, hemorrhage, hydrocephalus or extra-axial collection. Vascular: Major vessels at the base of the brain show flow. Skull and upper cervical spine: Negative Sinuses/Orbits: Clear/normal Other: None IMPRESSION: Motion degraded exam. No acute finding. 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 Electronically Signed   By: Nelson Chimes M.D.   On: 05/13/2018 15:20    EKG: Independently reviewed.  Sinus tachycardia at 111 bpm.  Left anterior fascicular block.  Assessment/Plan Principal Problem:   Dizziness Active  Problems:   Hypertension   Anemia   DM2 (diabetes mellitus, type 2) ([place])    1. Dizziness-multifactorial.  Brain MRI with no sign of CVA noted.  Will order PT/vestibular evaluation and avoid meclizine for now.  Patient noted to have old small infarctions of thalami/basal ganglia and hemispheric white matter.  This could be associated with poor glucose control and anemia as well. 2. Hyperglycemia with associated hyponatremia.  Placed on aggressive sliding scale insulin dosing as well as carb modified diet and check hemoglobin A1c.  Hold metformin for now in case contrast studies are needed. 3. Normocytic anemia.  Will check stool occult as ordered by EDP.  Anemia panel pending.  Transfuse as needed.  Avoid heparin agents at this time.  No signs of overt bleeding otherwise identified. 4. Hypertension.  Patient appears to have stable blood pressure control noted at this time, but this could be related to dehydration/hypovolemia.  Will maintain on IV fluid for now and monitor.   DVT prophylaxis: SCDs Code Status: Full Family Communication: None at bedside Disposition Plan: Admit for evaluation of possible CVA with MRI pending and PT evaluation Consults called: None Admission status: Inpatient, telemetry   [facility] Pager [phone]  If 7PM-7AM, please contact night-coverage [email or web address] Password [email or web address]  05/13/2018, 3:28 PM

## 2018-05-13 NOTE — ED Notes (Addendum)
Patient repositioned in bed.

## 2018-05-13 NOTE — ED Triage Notes (Signed)
Pt was brought in due to weakness and dizziness for 3 weeks that has progressively getting worse. EMS reports he was difficult to ambulate to truck. Pt reports he has been crawling around to move about house. Noncompliant with meds. Denies pain

## 2018-05-13 NOTE — ED Provider Notes (Signed)
[facility] EMERGENCY DEPARTMENT Provider Note   CSN: [id number] Arrival date & time: 05/13/18  1307    History   Chief Complaint Chief Complaint  Patient presents with  . Dizziness  . Weakness    HPI Darren Howard is a 68 y.o. male.     HPI Patient states he is had to 3 weeks of dizziness which she describes as room spinning.  He has had difficulty ambulating due to this.  Has occasional mild nausea associated with this.  Reports generalized weakness but no focal weakness.  Has had minimal cough but denies fever, chest pain or shortness of breath.  Denies head trauma. Past Medical History:  Diagnosis Date  . Diabetes mellitus without complication ([place])   . Glucose intolerance (impaired glucose tolerance)   . Hyperlipemia   . Hypertension     Patient Active Problem List   Diagnosis Date Noted  . Dizziness 05/13/2018  . Epistaxis 02/13/2014  . Hypertension 02/13/2014  . Anemia 02/13/2014  . DM2 (diabetes mellitus, type 2) ([place]) 02/13/2014  . Tachycardia 02/13/2014  . Hypertensive urgency     Past Surgical History:  Procedure Laterality Date  . CARDIAC CATHETERIZATION    . HERNIA REPAIR          Home Medications    Prior to Admission medications   Medication Sig Start Date End Date Taking? Authorizing Provider  metFORMIN (GLUCOPHAGE) 500 MG tablet Take 1 tablet (500 mg total) by mouth 2 (two) times daily. 03/29/17   Carmin Muskrat, MD  miconazole (MICOTIN) 2 % cream Apply 1 application topically 2 (two) times daily. 03/29/17   Carmin Muskrat, MD    Family History Family History  Problem Relation Age of Onset  . Congestive Heart Failure Mother   . Congestive Heart Failure Father     Social History Social History   Tobacco Use  . Smoking status: Current Every Day Smoker    Packs/day: 0.50    Years: 30.00    Pack years: 15.00    Types: Cigarettes  . Smokeless tobacco: Never Used  Substance Use Topics  . Alcohol use: Yes    Alcohol/week: 0.0  standard drinks    Comment: 5th per week   . Drug use: No     Allergies   Patient has no known allergies.   Review of Systems Review of Systems  Constitutional: Positive for fatigue. Negative for chills and fever.  HENT: Negative for sore throat and trouble swallowing.   Eyes: Positive for visual disturbance.  Respiratory: Positive for cough. Negative for shortness of breath.   Cardiovascular: Negative for chest pain, palpitations and leg swelling.  Gastrointestinal: Positive for nausea. Negative for abdominal pain, constipation and diarrhea.  Genitourinary: Negative for dysuria, flank pain and frequency.  Musculoskeletal: Positive for gait problem. Negative for back pain, myalgias and neck pain.  Skin: Negative for rash and wound.  Neurological: Positive for dizziness. Negative for speech difficulty, weakness, light-headedness, numbness and headaches.  All other systems reviewed and are negative.    Physical Exam Updated Vital Signs BP 128/84   Pulse (!) 108   Temp 98.1 F (36.7 C) (Oral)   Resp 14   Ht 6' (1.829 m)   SpO2 99%   BMI 27.12 kg/m   Physical Exam Vitals signs and nursing note reviewed.  Constitutional:      Appearance: Normal appearance. He is well-developed.  HENT:     Head: Normocephalic and atraumatic.     Comments: No obvious scalp trauma. Eyes:  Pupils: Pupils are equal, round, and reactive to light.     Comments: Non-fatigable horizontal nystagmus.  Patient is unable to look to the right past midline.  Appears to have disconjugate gaze.  No definite facial asymmetry.  Neck:     Musculoskeletal: Normal range of motion and neck supple.  Cardiovascular:     Rate and Rhythm: Regular rhythm. Tachycardia present.     Heart sounds: No murmur. No friction rub. No gallop.   Pulmonary:     Effort: Pulmonary effort is normal. No respiratory distress.     Breath sounds: Normal breath sounds. No stridor. No wheezing, rhonchi or rales.  Chest:      Chest wall: No tenderness.  Abdominal:     General: Bowel sounds are normal. There is no distension.     Palpations: Abdomen is soft. There is no mass.     Tenderness: There is no abdominal tenderness. There is no right CVA tenderness, left CVA tenderness, guarding or rebound.     Hernia: No hernia is present.  Musculoskeletal: Normal range of motion.        General: No swelling, tenderness, deformity or signs of injury.     Right lower leg: No edema.     Left lower leg: No edema.  Skin:    General: Skin is warm and dry.     Capillary Refill: Capillary refill takes less than 2 seconds.     Findings: No erythema or rash.  Neurological:     General: No focal deficit present.     Mental Status: He is alert and oriented to person, place, and time.     Comments: Patient with mild ataxia with finger-nose testing on the left hand.  Normal in the right.  4/5 motor in bilateral upper and lower extremities.  Sensation intact.  Psychiatric:        Behavior: Behavior normal.      ED Treatments / Results  Labs (all labs ordered are listed, but only abnormal results are displayed) Labs Reviewed  CBC WITH DIFFERENTIAL/PLATELET - Abnormal; Notable for the following components:      Result Value   RBC 2.39 (*)    Hemoglobin 7.7 (*)    HCT 22.7 (*)    RDW 15.8 (*)    All other components within normal limits  COMPREHENSIVE METABOLIC PANEL - Abnormal; Notable for the following components:   Sodium 130 (*)    Chloride 93 (*)    CO2 21 (*)    Glucose, Bld 373 (*)    BUN 30 (*)    Creatinine, Ser 1.58 (*)    Calcium 8.4 (*)    AST 57 (*)    GFR calc non Af Amer 45 (*)    GFR calc Af Amer 52 (*)    Anion gap 16 (*)    All other components within normal limits  CBG MONITORING, ED - Abnormal; Notable for the following components:   Glucose-Capillary 360 (*)    All other components within normal limits  TROPONIN I  URINALYSIS, ROUTINE W REFLEX MICROSCOPIC  OCCULT BLOOD X 1 CARD TO LAB,  STOOL  VITAMIN B12  FOLATE  IRON AND TIBC  FERRITIN  RETICULOCYTES    EKG EKG Interpretation  Date/Time:  Thursday May 13 2018 13:19:15 EDT Ventricular Rate:  111 PR Interval:    QRS Duration: 91 QT Interval:  359 QTC Calculation: 488 R Axis:   -52 Text Interpretation:  Sinus tachycardia Atrial premature complex Left anterior fascicular  block Probable lateral infarct, old Confirmed by Julianne Rice [phone]) on 05/13/2018 2:43:19 PM   Radiology Dg Chest 2 View  Result Date: 05/13/2018 CLINICAL DATA:  Cough EXAM: CHEST - 2 VIEW COMPARISON:  07/17/2006 FINDINGS: The heart size and mediastinal contours are within normal limits. Both lungs are clear. The visualized skeletal structures are unremarkable. IMPRESSION: No active cardiopulmonary disease. Electronically Signed   By: Franchot Gallo M.D.   On: 05/13/2018 14:22   Ct Head Wo Contrast  Result Date: 05/13/2018 CLINICAL DATA:  Weakness and dizziness for several weeks EXAM: CT HEAD WITHOUT CONTRAST TECHNIQUE: Contiguous axial images were obtained from the base of the skull through the vertex without intravenous contrast. COMPARISON:  None. FINDINGS: Brain: Mild atrophic changes and chronic white matter ischemic change is identified. No findings to suggest acute hemorrhage, acute infarction or space-occupying mass lesion seen. Few scattered lacunar infarcts are noted in the basal ganglia and thalami bilaterally. Vascular: No hyperdense vessel or unexpected calcification. Skull: Normal. Negative for fracture or focal lesion. Sinuses/Orbits: No acute finding. Other: None. IMPRESSION: Chronic atrophic and ischemic changes without acute abnormality. Electronically Signed   By: Inez Catalina M.D.   On: 05/13/2018 14:18    Procedures Procedures (including critical care time)  Medications Ordered in ED Medications  insulin aspart (novoLOG) injection 10 Units (has no administration in time range)  sodium chloride 0.9 % bolus 500 mL (has no  administration in time range)  sodium chloride 0.9 % bolus 500 mL (500 mLs Intravenous New Bag/Given 05/13/18 1349)     Initial Impression / Assessment and Plan / ED Course  I have reviewed the triage vital signs and the nursing notes.  Pertinent labs & imaging results that were available during my care of the patient were reviewed by me and considered in my medical decision making (see chart for details).       Suspect central cause for the patient's vertigo.  CT with evidence of previous basal ganglia and thalamic strokes but no acute strokes demonstrated.  Will get MRI.  Patient also is hyperglycemic and appears dehydrated.  Initiate IV fluids and insulin.  Patient also has hemoglobin of 7.7.  Will check Hemoccult stool.  Discussed with hospitalist will see patient emergency department and admit.   Final Clinical Impressions(s) / ED Diagnoses   Final diagnoses:  Ataxia  Hyperglycemia  Dehydration  Anemia, unspecified type    ED Discharge Orders    None       Julianne Rice, MD 05/13/18 1443

## 2018-05-13 NOTE — ED Notes (Signed)
ED TO INPATIENT HANDOFF REPORT  ED Nurse Name and Phone #: Gaspar Bidding [phone]  S Name/Age/Gender Darren Howard 68 y.o. male Room/Bed: APA11/APA11  Code Status   Code Status: Prior  Home/SNF/Other Home Patient oriented to: self, place, time and situation Is this baseline? Yes   Triage Complete: Triage complete  Chief Complaint DIZZINESS  Triage Note Pt was brought in due to weakness and dizziness for 3 weeks that has progressively getting worse. EMS reports he was difficult to ambulate to truck. Pt reports he has been crawling around to move about house. Noncompliant with meds. Denies pain   Allergies No Known Allergies  Level of Care/Admitting Diagnosis ED Disposition    ED Disposition Condition [facility] Area: [facility] [[id number]]  Level of Care: Telemetry [5]  Diagnosis: Dizziness [314970]  Admitting Physician: Rodena Goldmann [[id number]]  Attending Physician: Rodena Goldmann [[id number]]  Estimated length of stay: past midnight tomorrow  Certification:: I certify this patient will need inpatient services for at least 2 midnights  PT Class (Do Not Modify): Inpatient [101]  PT Acc Code (Do Not Modify): Private [1]       B Medical/Surgery History Past Medical History:  Diagnosis Date  . Diabetes mellitus without complication ([place])   . Glucose intolerance (impaired glucose tolerance)   . Hyperlipemia   . Hypertension    Past Surgical History:  Procedure Laterality Date  . CARDIAC CATHETERIZATION    . HERNIA REPAIR       A IV Location/Drains/Wounds Patient Lines/Drains/Airways Status   Active Line/Drains/Airways    Name:   Placement date:   Placement time:   Site:   Days:   Peripheral IV 05/13/18 Left Antecubital   05/13/18    1333    Antecubital   less than 1          Intake/Output Last 24 hours No intake or output data in the 24 hours ending 05/13/18 1535  Labs/Imaging Results for orders placed or performed during the hospital  encounter of 05/13/18 (from the past 48 hour(s))  CBG monitoring, ED     Status: Abnormal   Collection Time: 05/13/18  1:11 PM  Result Value Ref Range   Glucose-Capillary 360 (H) 70 - 99 mg/dL   Comment 1 Notify RN   Reticulocytes     Status: Abnormal   Collection Time: 05/13/18  1:30 PM  Result Value Ref Range   Retic Ct Pct 0.3 (L) 0.4 - 3.1 %   RBC. 2.35 (L) 4.22 - 5.81 MIL/uL   Retic Count, Absolute 6.3 (L) 19.0 - 186.0 K/uL   Immature Retic Fract 0.0 (L) 2.3 - 15.9 %    Comment: Performed at [facility], [address] [place], [place] [address]  CBC with Differential/Platelet     Status: Abnormal   Collection Time: 05/13/18  1:38 PM  Result Value Ref Range   WBC 8.7 4.0 - 10.5 K/uL   RBC 2.39 (L) 4.22 - 5.81 MIL/uL   Hemoglobin 7.7 (L) 13.0 - 17.0 g/dL   HCT 22.7 (L) 39.0 - 52.0 %   MCV 95.0 80.0 - 100.0 fL   MCH 32.2 26.0 - 34.0 pg   MCHC 33.9 30.0 - 36.0 g/dL   RDW 15.8 (H) 11.5 - 15.5 %   Platelets 305 150 - 400 K/uL   nRBC 0.2 0.0 - 0.2 %   Neutrophils Relative % 83 %   Neutro Abs 7.2 1.7 - 7.7 K/uL   Lymphocytes Relative  8 %   Lymphs Abs 0.7 0.7 - 4.0 K/uL   Monocytes Relative 8 %   Monocytes Absolute 0.7 0.1 - 1.0 K/uL   Eosinophils Relative 0 %   Eosinophils Absolute 0.0 0.0 - 0.5 K/uL   Basophils Relative 0 %   Basophils Absolute 0.0 0.0 - 0.1 K/uL   Immature Granulocytes 1 %   Abs Immature Granulocytes 0.06 0.00 - 0.07 K/uL    Comment: Performed at [facility], [address], [place], [place] [address]  Comprehensive metabolic panel     Status: Abnormal   Collection Time: 05/13/18  1:38 PM  Result Value Ref Range   Sodium 130 (L) 135 - 145 mmol/L   Potassium 3.7 3.5 - 5.1 mmol/L   Chloride 93 (L) 98 - 111 mmol/L   CO2 21 (L) 22 - 32 mmol/L   Glucose, Bld 373 (H) 70 - 99 mg/dL   BUN 30 (H) 8 - 23 mg/dL   Creatinine, Ser 1.58 (H) 0.61 - 1.24 mg/dL   Calcium 8.4 (L) 8.9 - 10.3 mg/dL   Total Protein 6.9 6.5 - 8.1 g/dL   Albumin 3.5 3.5 - 5.0 g/dL    AST 57 (H) 15 - 41 U/L   ALT 24 0 - 44 U/L   Alkaline Phosphatase 112 38 - 126 U/L   Total Bilirubin 1.0 0.3 - 1.2 mg/dL   GFR calc non Af Amer 45 (L) >60 mL/min   GFR calc Af Amer 52 (L) >60 mL/min   Anion gap 16 (H) 5 - 15    Comment: Performed at [facility], [address] [place], [place] [address]  Troponin I - ONCE - STAT     Status: None   Collection Time: 05/13/18  1:38 PM  Result Value Ref Range   Troponin I <0.03 <0.03 ng/mL    Comment: Performed at [facility], [address] [place], [place] [address]   Dg Chest 2 View  Result Date: 05/13/2018 CLINICAL DATA:  Cough EXAM: CHEST - 2 VIEW COMPARISON:  07/17/2006 FINDINGS: The heart size and mediastinal contours are within normal limits. Both lungs are clear. The visualized skeletal structures are unremarkable. IMPRESSION: No active cardiopulmonary disease. Electronically Signed   By: Franchot Gallo M.D.   On: 05/13/2018 14:22   Ct Head Wo Contrast  Result Date: 05/13/2018 CLINICAL DATA:  Weakness and dizziness for several weeks EXAM: CT HEAD WITHOUT CONTRAST TECHNIQUE: Contiguous axial images were obtained from the base of the skull through the vertex without intravenous contrast. COMPARISON:  None. FINDINGS: Brain: Mild atrophic changes and chronic white matter ischemic change is identified. No findings to suggest acute hemorrhage, acute infarction or space-occupying mass lesion seen. Few scattered lacunar infarcts are noted in the basal ganglia and thalami bilaterally. Vascular: No hyperdense vessel or unexpected calcification. Skull: Normal. Negative for fracture or focal lesion. Sinuses/Orbits: No acute finding. Other: None. IMPRESSION: Chronic atrophic and ischemic changes without acute abnormality. Electronically Signed   By: Inez Catalina M.D.   On: 05/13/2018 14:18   Mr Brain Wo Contrast  Result Date: 05/13/2018 CLINICAL DATA:  Three-week history of dizziness and gait disturbance. EXAM: MRI HEAD WITHOUT CONTRAST TECHNIQUE:  Multiplanar, multiecho pulse sequences of the brain and surrounding structures were obtained without intravenous contrast. COMPARISON:  Head CT 05/13/2018 FINDINGS: Brain: Diffusion imaging does not show any acute or subacute infarction. The brainstem and cerebellum are normal. There is an old lacunar infarction in the right thalamus and in both basal ganglia and  radiating white matter tracts. No large vessel territory infarction. No mass lesion, hemorrhage, hydrocephalus or extra-axial collection. Vascular: Major vessels at the base of the brain show flow. Skull and upper cervical spine: Negative Sinuses/Orbits: Clear/normal Other: None IMPRESSION: Motion degraded exam. No acute finding. 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 Electronically Signed   By: Nelson Chimes M.D.   On: 05/13/2018 15:20    Pending Labs Unresulted Labs (From admission, onward)    Start     Ordered   05/13/18 1442  Vitamin B12  (Anemia Panel (PNL))  Once,   R     05/13/18 1441   05/13/18 1442  Folate  (Anemia Panel (PNL))  Once,   R     05/13/18 1441   05/13/18 1442  Iron and TIBC  (Anemia Panel (PNL))  Once,   R     05/13/18 1441   05/13/18 1442  Ferritin  (Anemia Panel (PNL))  Once,   R     05/13/18 1441   05/13/18 1432  Occult blood card to lab, stool  Once,   STAT     05/13/18 1431   05/13/18 1339  Urinalysis, Routine w reflex microscopic  Once,   R     05/13/18 1338          Vitals/Pain Today's Vitals   05/13/18 1332 05/13/18 1333 05/13/18 1400 05/13/18 1430  BP: (!) 141/87  137/77 128/84  Pulse: (!) 113 (!) 112 (!) 108   Resp: 16 18 18 14   Temp:      TempSrc:      SpO2: 100% 100% 99%   Height:      PainSc:        Isolation Precautions No active isolations  Medications Medications  insulin aspart (novoLOG) injection 10 Units (has no administration in time range)  sodium chloride 0.9 % bolus 500 mL (has no administration in time range)  sodium  chloride 0.9 % bolus 500 mL (500 mLs Intravenous New Bag/Given 05/13/18 1349)    Mobility non-ambulatory High fall risk   Focused Assessments Neuro Assessment Handoff:  Swallow screen pass? Yes          Neuro Assessment:   Neuro Checks:      Last Documented NIHSS Modified Score:   Has TPA been given? No If patient is a Neuro Trauma and patient is going to OR before floor call report to [place] nurse: [phone] or [phone]     R Recommendations: See Admitting Provider Note  Report given to:   Additional Notes: Patient is too weak to ambulate

## 2018-05-14 LAB — OCCULT BLOOD X 1 CARD TO LAB, STOOL: Fecal Occult Bld: POSITIVE — AB

## 2018-05-14 LAB — URINALYSIS, ROUTINE W REFLEX MICROSCOPIC
Bilirubin Urine: NEGATIVE
Glucose, UA: 50 mg/dL — AB
Hgb urine dipstick: NEGATIVE
Ketones, ur: 5 mg/dL — AB
Leukocytes,Ua: NEGATIVE
Nitrite: NEGATIVE
PH: 5 (ref 5.0–8.0)
Protein, ur: NEGATIVE mg/dL
SPECIFIC GRAVITY, URINE: 1.018 (ref 1.005–1.030)

## 2018-05-14 LAB — CBC
HCT: 21.2 % — ABNORMAL LOW (ref 39.0–52.0)
Hemoglobin: 7.2 g/dL — ABNORMAL LOW (ref 13.0–17.0)
MCH: 32.7 pg (ref 26.0–34.0)
MCHC: 34 g/dL (ref 30.0–36.0)
MCV: 96.4 fL (ref 80.0–100.0)
PLATELETS: 260 10*3/uL (ref 150–400)
RBC: 2.2 MIL/uL — AB (ref 4.22–5.81)
RDW: 15.2 % (ref 11.5–15.5)
WBC: 7.4 10*3/uL (ref 4.0–10.5)
nRBC: 0 % (ref 0.0–0.2)

## 2018-05-14 LAB — BASIC METABOLIC PANEL
Anion gap: 11 (ref 5–15)
BUN: 27 mg/dL — ABNORMAL HIGH (ref 8–23)
CO2: 25 mmol/L (ref 22–32)
Calcium: 8.2 mg/dL — ABNORMAL LOW (ref 8.9–10.3)
Chloride: 99 mmol/L (ref 98–111)
Creatinine, Ser: 1.22 mg/dL (ref 0.61–1.24)
GFR calc Af Amer: >60 mL/min (ref >60)
GFR calc non Af Amer: >60 mL/min (ref >60)
Glucose, Bld: 172 mg/dL — ABNORMAL HIGH (ref 70–99)
Potassium: 3.3 mmol/L — ABNORMAL LOW (ref 3.5–5.1)
Sodium: 135 mmol/L (ref 135–145)

## 2018-05-14 LAB — GLUCOSE, CAPILLARY
Glucose-Capillary: 181 mg/dL — ABNORMAL HIGH (ref 70–99)
Glucose-Capillary: 181 mg/dL — ABNORMAL HIGH (ref 70–99)
Glucose-Capillary: 208 mg/dL — ABNORMAL HIGH (ref 70–99)
Glucose-Capillary: 160 mg/dL — ABNORMAL HIGH (ref 70–99)

## 2018-05-14 LAB — FOLATE: Folate: 2.4 ng/mL — ABNORMAL LOW (ref >5.9)

## 2018-05-14 MED ORDER — ADULT MULTIVITAMIN W/MINERALS CH
1.0000 | ORAL_TABLET | Freq: Every day | ORAL | Status: DC
  Administered 2018-05-14 – 2018-05-18 (×5): 1 via ORAL
  Filled 2018-05-14 (×5): qty 1

## 2018-05-14 MED ORDER — ATORVASTATIN CALCIUM 10 MG PO TABS
10.0000 mg | ORAL_TABLET | Freq: Every day | ORAL | Status: DC
  Administered 2018-05-14 – 2018-05-17 (×4): 10 mg via ORAL
  Filled 2018-05-14 (×4): qty 1

## 2018-05-14 MED ORDER — SODIUM CHLORIDE 0.9 % IV BOLUS
1000.0000 mL | Freq: Once | INTRAVENOUS | Status: AC
  Administered 2018-05-14: 1000 mL via INTRAVENOUS

## 2018-05-14 MED ORDER — SODIUM CHLORIDE 0.9 % IV BOLUS: 1000.0000 mL | Freq: Once | INTRAVENOUS | Status: DC

## 2018-05-14 MED ORDER — POTASSIUM CHLORIDE CRYS ER 20 MEQ PO TBCR
40.0000 meq | EXTENDED_RELEASE_TABLET | Freq: Once | ORAL | Status: AC
  Administered 2018-05-14: 40 meq via ORAL
  Filled 2018-05-14: qty 2

## 2018-05-14 MED ORDER — THIAMINE HCL 100 MG/ML IJ SOLN
100.0000 mg | Freq: Every day | INTRAMUSCULAR | Status: DC
  Administered 2018-05-17: 100 mg via INTRAVENOUS
  Filled 2018-05-14 (×2): qty 2

## 2018-05-14 MED ORDER — FOLIC ACID 1 MG PO TABS
1.0000 mg | ORAL_TABLET | Freq: Every day | ORAL | Status: DC
  Administered 2018-05-14 – 2018-05-18 (×5): 1 mg via ORAL
  Filled 2018-05-14 (×5): qty 1

## 2018-05-14 MED ORDER — PANTOPRAZOLE SODIUM 40 MG PO TBEC
40.0000 mg | DELAYED_RELEASE_TABLET | Freq: Every day | ORAL | Status: DC
  Administered 2018-05-14: 40 mg via ORAL
  Filled 2018-05-14 (×2): qty 1

## 2018-05-14 MED ORDER — METFORMIN HCL ER 500 MG PO TB24
1000.0000 mg | ORAL_TABLET | Freq: Two times a day (BID) | ORAL | Status: DC
  Administered 2018-05-14 (×2): 1000 mg via ORAL
  Filled 2018-05-14 (×3): qty 2

## 2018-05-14 MED ORDER — LORAZEPAM 1 MG PO TABS: 1.0000 mg | ORAL_TABLET | Freq: Four times a day (QID) | ORAL | Status: AC | PRN

## 2018-05-14 MED ORDER — LORAZEPAM 2 MG/ML IJ SOLN: 1.0000 mg | Freq: Four times a day (QID) | INTRAMUSCULAR | Status: AC | PRN

## 2018-05-14 MED ORDER — VITAMIN B-1 100 MG PO TABS
100.0000 mg | ORAL_TABLET | Freq: Every day | ORAL | Status: DC
  Administered 2018-05-14 – 2018-05-18 (×5): 100 mg via ORAL
  Filled 2018-05-14 (×5): qty 1

## 2018-05-14 NOTE — Evaluation (Signed)
Occupational Therapy Evaluation Patient Details Name: Darren Howard MRN: [id number] DOB: 10-26-1950 Today's Date: 05/14/2018    History of Present Illness Darren Howard is a 68 y.o. male with medical history significant for type 2 diabetes with medication noncompliance, dyslipidemia, hypertension, and alcohol abuse who presented to the ED with complaints of weakness and dizziness that has been ongoing for the last 2 to 3 weeks.  As a result, he cannot ambulate very well and typically crawls around.  He states that he feels that the room is spinning and has had some mild nausea, but no vomiting.  He denies any fevers or chills, chest pain, or shortness of breath.  He denies any presyncopal symptoms and is otherwise a poor historian.  No trouble with speech or swallowing noted.  He called EMS for evaluation in the ED per his son's recommendation today.  Patient apparently has not been taking his home medications as prescribed either.  He denies any blood in his stools or black tarry stools.   Clinical Impression   Patient is in bed and agreeable to OT evaluation. Patient presents with BUE weakness and decreased endurance and balance due to poorly managed health and increased dizziness. Patient will benefit from skilled OT services to increase functional performance during ADL tasks. Recommend SNF at discharge at this time for short term rehab. If patient does not wish to discharge to SNF he will need Home health OT and 24/7 supervision initially for safety due to decreased balance.     Follow Up Recommendations  SNF    Equipment Recommendations  3 in 1 bedside commode;Tub/shower seat       Precautions / Restrictions Precautions Precautions: Fall Restrictions Weight Bearing Restrictions: No      Mobility Bed Mobility Overal bed mobility: Needs Assistance Bed Mobility: Supine to Sit;Sit to Supine     Supine to sit: Supervision Sit to supine: Supervision   General bed mobility comments:  labored movement  Transfers Overall transfer level: Needs assistance Equipment used: Rolling walker (2 wheeled)  Sit to Stand: Min assist                ADL either performed or assessed with clinical judgement   ADL Overall ADL's : Needs assistance/impaired                     Lower Body Dressing: Supervision/safety;Sitting/lateral leans Lower Body Dressing Details (indicate cue type and reason): donning and doffing hospital socks             Functional mobility during ADLs: Minimal assistance;Cueing for sequencing;Cueing for safety       Vision Baseline Vision/History: No visual deficits Patient Visual Report: No change from baseline              Pertinent Vitals/Pain Pain Assessment: No/denies pain Pain Score: 2  Pain Location: bilateral feet Pain Descriptors / Indicators: Tingling;Discomfort Pain Intervention(s): Limited activity within patient's tolerance;Monitored during session     Hand Dominance Right   Extremity/Trunk Assessment Upper Extremity Assessment Upper Extremity Assessment: Generalized weakness   Lower Extremity Assessment Lower Extremity Assessment: Defer to PT evaluation   Cervical / Trunk Assessment Cervical / Trunk Assessment: Kyphotic   Communication Communication Communication: No difficulties   Cognition Arousal/Alertness: Awake/alert Behavior During Therapy: WFL for tasks assessed/performed Overall Cognitive Status: Within Functional Limits for tasks assessed                Home Living Family/patient expects to be discharged to:: Skilled nursing  facility Living Arrangements: Alone Available Help at Discharge: Family;Available PRN/intermittently(Pt reports that his son will stop in and check on him) Type of Home: House Home Access: Level entry     Home Layout: Two level;Able to live on main level with bedroom/bathroom Alternate Level Stairs-Number of Steps: 7-8 Alternate Level Stairs-Rails: None        Bathroom Accessibility: Yes   Home Equipment: None          Prior Functioning/Environment Level of Independence: Independent        Comments: community ambulator, drives        OT Problem List: Decreased strength;Decreased knowledge of use of DME or AE;Decreased activity tolerance;Impaired balance (sitting and/or standing)      OT Treatment/Interventions: Self-care/ADL training;Balance training;Therapeutic exercise;Neuromuscular education;Therapeutic activities;DME and/or AE instruction;Manual therapy;Patient/family education    OT Goals(Current goals can be found in the care plan section) Acute Rehab OT Goals Patient Stated Goal: return home OT Goal Formulation: With patient Time For Goal Achievement: 05/28/18 Potential to Achieve Goals: Good  OT Frequency: Min 2X/week   Barriers to D/C: Decreased caregiver support  Pt lives alone          AM-PAC OT "6 Clicks" Daily Activity     Outcome Measure Help from another person eating meals?: None Help from another person taking care of personal grooming?: A Little Help from another person toileting, which includes using toliet, bedpan, or urinal?: A Little Help from another person bathing (including washing, rinsing, drying)?: A Lot Help from another person to put on and taking off regular upper body clothing?: A Lot Help from another person to put on and taking off regular lower body clothing?: A Lot 6 Click Score: 16   End of Session Equipment Utilized During Treatment: Gait belt  Activity Tolerance: Patient tolerated treatment well Patient left: in bed;with call bell/phone within reach;with bed alarm set  OT Visit Diagnosis: Muscle weakness (generalized) (M62.81)                Time: 9629-5284 OT Time Calculation (min): 11 min Charges:  OT General Charges $OT Visit: 1 Visit OT Evaluation $OT Eval Low Complexity: 1 Low  Ailene Ravel, OTR/L,CBIS  [phone]   Darren Howard, Darren Howard 05/14/2018, 11:23  AM

## 2018-05-14 NOTE — NC FL2 (Signed)
[place] LEVEL OF CARE SCREENING TOOL     IDENTIFICATION  Patient Name: Darren Howard Birthdate: 05-10-1950 Sex: male Admission Date (Current Location): 05/13/2018  [facility] and [place] Number:  [employer] and Address:  [place] [address], [place]      Provider Number: [id number]  Attending Physician Name and Address:  Rodena Goldmann, DO  Relative Name and Phone Number:  Dajion Bickford [phone]    Current Level of Care: Hospital Recommended Level of Care: [place] Prior Approval Number:    Date Approved/Denied:   PASRR Number: [id number]  Discharge Plan: SNF    Current Diagnoses: Patient Active Problem List   Diagnosis Date Noted  . Dizziness 05/13/2018  . Epistaxis 02/13/2014  . Hypertension 02/13/2014  . Anemia 02/13/2014  . DM2 (diabetes mellitus, type 2) ([place]) 02/13/2014  . Tachycardia 02/13/2014  . Hypertensive urgency     Orientation RESPIRATION BLADDER Height & Weight     Self, Time, Situation, Place  Normal Continent Weight: 83.7 kg Height:  6' (182.9 cm)  BEHAVIORAL SYMPTOMS/MOOD NEUROLOGICAL BOWEL NUTRITION STATUS      Continent Diet(carb modified)  AMBULATORY STATUS COMMUNICATION OF NEEDS Skin   Limited Assist Verbally Normal                       Personal Care Assistance Level of Assistance  Bathing, Feeding, Dressing Bathing Assistance: Limited assistance Feeding assistance: Independent Dressing Assistance: Limited assistance     Functional Limitations Info  Sight, Hearing, Speech Sight Info: Adequate Hearing Info: Adequate      SPECIAL CARE FACTORS FREQUENCY  PT (By licensed PT), OT (By licensed OT)     PT Frequency: 5 days/week OT Frequency: 5 days/week            Contractures Contractures Info: Not present    Additional Factors Info  Code Status, Allergies, Psychotropic, Insulin Sliding Scale, Isolation Precautions, Suctioning Needs  Code Status Info: full Allergies Info: n/a Psychotropic Info: n/a Insulin Sliding Scale Info: n/a Isolation Precautions Info: n/a Suctioning Needs: n/a   Current Medications (05/14/2018):  This is the current hospital active medication list Current Facility-Administered Medications  Medication Dose Route Frequency Provider Last Rate Last Dose  . 0.9 %  sodium chloride infusion   Intravenous Continuous Heath Lark D, DO 75 mL/hr at 05/13/18 1622    . acetaminophen (TYLENOL) tablet 650 mg  650 mg Oral Q6H PRN Heath Lark D, DO   650 mg at 05/13/18 5053   Or  . acetaminophen (TYLENOL) suppository 650 mg  650 mg Rectal Q6H PRN Manuella Ghazi, Pratik D, DO      . atorvastatin (LIPITOR) tablet 10 mg  10 mg Oral QHS Shah, Pratik D, DO      . folic acid (FOLVITE) tablet 1 mg  1 mg Oral Daily Manuella Ghazi, Pratik D, DO   1 mg at 05/14/18 0840  . insulin aspart (novoLOG) injection 0-15 Units  0-15 Units Subcutaneous TID WC Shah, Pratik D, DO   3 Units at 05/14/18 1241  . insulin aspart (novoLOG) injection 0-5 Units  0-5 Units Subcutaneous QHS Shah, Pratik D, DO      . LORazepam (ATIVAN) tablet 1 mg  1 mg Oral Q6H PRN Manuella Ghazi, Pratik D, DO       Or  . LORazepam (ATIVAN) injection 1 mg  1 mg Intravenous Q6H PRN Manuella Ghazi, Pratik D, DO      . metFORMIN (GLUCOPHAGE-XR)  24 hr tablet 1,000 mg  1,000 mg Oral BID WC Shah, Pratik D, DO   1,000 mg at 05/14/18 1040  . multivitamin with minerals tablet 1 tablet  1 tablet Oral Daily Manuella Ghazi, Pratik D, DO   1 tablet at 05/14/18 1040  . ondansetron (ZOFRAN) tablet 4 mg  4 mg Oral Q6H PRN Manuella Ghazi, Pratik D, DO       Or  . ondansetron (ZOFRAN) injection 4 mg  4 mg Intravenous Q6H PRN Manuella Ghazi, Pratik D, DO   4 mg at 05/13/18 1958  . pantoprazole (PROTONIX) EC tablet 40 mg  40 mg Oral Daily Manuella Ghazi, Pratik D, DO   40 mg at 05/14/18 0841  . sodium chloride 0.9 % bolus 500 mL  500 mL Intravenous Once Manuella Ghazi, Pratik D, DO      . thiamine (VITAMIN B-1) tablet 100 mg  100 mg Oral Daily Manuella Ghazi, Pratik D, DO   100  mg at 05/14/18 1040   Or  . thiamine (B-1) injection 100 mg  100 mg Intravenous Daily Manuella Ghazi, Pratik D, DO         Discharge Medications: Please see discharge summary for a list of discharge medications.  Relevant Imaging Results:  Relevant Lab Results:   Additional Information ss# [id number]  Sherald Barge, RN

## 2018-05-14 NOTE — Plan of Care (Signed)
  Problem: Acute Rehab PT Goals(only PT should resolve) Goal: Pt Will Go Supine/Side To Sit Outcome: Progressing Flowsheets (Taken 05/14/2018 0909) Pt will go Supine/Side to Sit: with supervision Goal: Patient Will Transfer Sit To/From Stand Outcome: Progressing Flowsheets (Taken 05/14/2018 0909) Patient will transfer sit to/from stand: with supervision Goal: Pt Will Transfer Bed To Chair/Chair To Bed Outcome: Progressing Flowsheets (Taken 05/14/2018 0909) Pt will Transfer Bed to Chair/Chair to Bed: min guard assist Goal: Pt Will Ambulate Outcome: Progressing Flowsheets (Taken 05/14/2018 0909) Pt will Ambulate: with min guard assist; 75 feet; with rolling walker; with cane   9:10 AM, 05/14/18 Lonell Grandchild, MPT Physical Therapist with [facility] [phone] office [phone] mobile phone

## 2018-05-14 NOTE — TOC Initial Note (Addendum)
Transition of Care [facility]) - Initial/Assessment Note    Patient Details  Name: Darren Howard MRN: [id number] Date of Birth: 06/21/1950  Transition of Care [facility] CM/SW Contact:    Sherald Barge, RN Phone Number: 05/14/2018, 1:57 PM  Clinical Narrative:      Initially from home, PT recommends SNF. Pt would like to return home but okay with planning for SNF also. Pt would like to use [facility] if he goes home. Pt would prefer [facility] as his first SNF choice. Pt admits to not taking his medications as prescribed. He has not felt well for a month. He is compliant with MD appointments. CM verified this with PCP office and made f/u for next week. CM discussed DC plan with son [name]) who works first shift and would not be able to help 24/7. He would prefer SNF unless pt improves before DC.             Update 1500: Bed offer accepted at [facility] submitted to [place].  Update 1511: Auth received from [facility]; good for 5 days; next review due 05/18/2018; send review to [facility]  (f) [phone] ; approved for 525 mins/week; codes: PT/OT - TJ, SLP - SD, non-therapy - NF, NUR - PBC1.   Update 1515: patients son called and said he is considering taking patient to his home after DC and son's wife will help care for patient. CM encouraged son to discuss plan with his father (phoen number to room provided) Referral made to [facility] for potential [facility] this weekend. Update left for weekend CM, RN and MD.   Expected Discharge Plan: [place]     Patient Goals and CMS Choice Patient states their goals for this hospitalization and ongoing recovery are:: control my Blood pressure CMS Medicare.gov Compare Post Acute Care list provided to:: Patient Choice offered to / list presented to : Patient  Expected Discharge Plan and Services Expected Discharge Plan: [place] Choice: [place] arrangements for the past 2 months: Single Family Home                          Prior Living Arrangements/Services Living arrangements for the past 2 months: Single Family Home Lives with:: Self Patient language and need for interpreter reviewed:: Yes Do you feel safe going back to the place where you live?: Yes      Need for Family Participation in Patient Care: Yes (Comment) Care giver support system in place?: Yes (comment)   Criminal Activity/Legal Involvement Pertinent to Current Situation/Hospitalization: No - Comment as needed  Activities of Daily Living Home Assistive Devices/Equipment: None ADL Screening (condition at time of admission) Patient's cognitive ability adequate to safely complete daily activities?: Yes Is the patient deaf or have difficulty hearing?: No Does the patient have difficulty seeing, even when wearing glasses/contacts?: No Does the patient have difficulty concentrating, remembering, or making decisions?: No Patient able to express need for assistance with ADLs?: Yes Does the patient have difficulty dressing or bathing?: No Independently performs ADLs?: Yes (appropriate for developmental age) Does the patient have difficulty walking or climbing stairs?: No Weakness of Legs: Both Weakness of Arms/Hands: None  Permission Sought/Granted Permission sought to share information with : Facility Sport and exercise psychologist, Family Supports, PCP Permission granted to share information with : Yes, Verbal Permission Granted  Share Information with NAME: [name] (son)  Permission granted to share info w AGENCY: [facility], [facility], [place], [place]; [facility]        Emotional Assessment Appearance:: Appears older than stated age   Affect (typically observed): Appropriate, Calm Orientation: : Oriented to Self, Oriented to Place, Oriented to  Time, Oriented to Situation Alcohol / Substance Use: Alcohol  Use    Admission diagnosis:  Dehydration [E86.0] Ataxia [R27.0] Hyperglycemia [R73.9] Anemia, unspecified type [D64.9] Patient Active Problem List   Diagnosis Date Noted  . Dizziness 05/13/2018  . Epistaxis 02/13/2014  . Hypertension 02/13/2014  . Anemia 02/13/2014  . DM2 (diabetes mellitus, type 2) ([place]) 02/13/2014  . Tachycardia 02/13/2014  . Hypertensive urgency    PCP:  [name], MD Pharmacy:   [facility], [place] [place] [address] Phone: [phone] Fax: [phone]     Social Determinants of Health (SDOH) Interventions    Readmission Risk Interventions Readmission Risk Prevention Plan 05/14/2018  Transportation Screening Complete  PCP or Specialist Appt within 5-7 Days Complete  Home Care Screening Complete  Medication Review (RN CM) Complete  Some recent data might be hidden

## 2018-05-14 NOTE — Plan of Care (Signed)
  Problem: Acute Rehab OT Goals (only OT should resolve) Goal: Pt. Will Perform Grooming Flowsheets (Taken 05/14/2018 1126) Pt Will Perform Grooming: with modified independence; sitting Goal: Pt. Will Perform Upper Body Bathing Flowsheets (Taken 05/14/2018 1126) Pt Will Perform Upper Body Bathing: with set-up; sitting Goal: Pt. Will Perform Upper Body Dressing Flowsheets (Taken 05/14/2018 1126) Pt Will Perform Upper Body Dressing: with set-up; sitting Goal: Pt. Will Transfer To Toilet Flowsheets (Taken 05/14/2018 1126) Pt Will Transfer to Toilet: with supervision; stand pivot transfer; bedside commode; regular height toilet; grab bars; ambulating Goal: Pt. Will Perform Toileting-Clothing Manipulation Flowsheets (Taken 05/14/2018 1126) Pt Will Perform Toileting - Clothing Manipulation and hygiene: with supervision; sitting/lateral leans; sit to/from stand Goal: Pt/Caregiver Will Perform Home Exercise Program Flowsheets (Taken 05/14/2018 1126) Pt/caregiver will Perform Home Exercise Program: Increased strength; Both right and left upper extremity; With Supervision; With written HEP provided

## 2018-05-14 NOTE — Evaluation (Addendum)
Physical Therapy Evaluation Patient Details Name: Darren Howard MRN: [id number] DOB: 07/20/1950 Today's Date: 05/14/2018   History of Present Illness  Darren Howard is a 68 y.o. male with medical history significant for type 2 diabetes with medication noncompliance, dyslipidemia, hypertension, and alcohol abuse who presented to the ED with complaints of weakness and dizziness that has been ongoing for the last 2 to 3 weeks.  As a result, he cannot ambulate very well and typically crawls around.  He states that he feels that the room is spinning and has had some mild nausea, but no vomiting.  He denies any fevers or chills, chest pain, or shortness of breath.  He denies any presyncopal symptoms and is otherwise a poor historian.  No trouble with speech or swallowing noted.  He called EMS for evaluation in the ED per his son's recommendation today.  Patient apparently has not been taking his home medications as prescribed either.  He denies any blood in his stools or black tarry stools.    Clinical Impression  Patient states he is not compliant with taking BP medication daily, usually takes 2-3 times per week.  Patient's orthostatic BP's as follows: supine 135/84, sitting 95/65, standing 98/64 with c/o mild dizziness upon sitting up and while standing.  Right and left Hallpike-Dix maneuvers performed for possible BPPV, results negative for nystagmus, c/o mild dizziness, HR increased above 120 BPM and stiffness in neck - unable to fully extend neck.  Patient functioning below baseline and has to lean on nearby objects for support due to poor standing balance and limited to walking to doorway secondary to c/o fatigue, very unsteady and required use of RW.  Patient tolerated sitting up in chair after therapy - RN aware.     Follow Up Recommendations SNF;Supervision/Assistance - 24 hour    Equipment Recommendations  Rolling walker with 5" wheels    Recommendations for Other Services       Precautions  / Restrictions Precautions Precautions: Fall Restrictions Weight Bearing Restrictions: No      Mobility  Bed Mobility Overal bed mobility: Needs Assistance Bed Mobility: Supine to Sit;Sit to Supine     Supine to sit: Min guard     General bed mobility comments: labored movement  Transfers Overall transfer level: Needs assistance Equipment used: Rolling walker (2 wheeled) Transfers: Sit to/from [facility] Sit to Stand: Min assist Stand pivot transfers: Min assist       General transfer comment: unsteady movement with tendency to lean on nearby objects for support, safer using RW  Ambulation/Gait Ambulation/Gait assistance: Min assist Gait Distance (Feet): 20 Feet Assistive device: Rolling walker (2 wheeled) Gait Pattern/deviations: Decreased step length - right;Decreased step length - left;Decreased stride length Gait velocity: decreased   General Gait Details: slow labored unsteady cadence with tendency to lean on walls, nearby objects for support, required use of RW for safety, limited secondary to c/o fatigue mild dizziness  Stairs            Wheelchair Mobility    Modified Rankin (Stroke Patients Only)       Balance Overall balance assessment: Needs assistance Sitting-balance support: Feet supported;No upper extremity supported Sitting balance-Leahy Scale: Fair     Standing balance support: During functional activity;No upper extremity supported Standing balance-Leahy Scale: Poor Standing balance comment: fair using RW                             Pertinent Vitals/Pain  Pain Assessment: 0-10 Pain Score: 2  Pain Location: bilateral feet Pain Descriptors / Indicators: Tingling;Discomfort Pain Intervention(s): Limited activity within patient's tolerance;Monitored during session    [place] expects to be discharged to:: Private residence Living Arrangements: Alone Available Help at Discharge:  Family;Available PRN/intermittently Type of Home: House Home Access: Level entry     Home Layout: Two level;Able to live on main level with bedroom/bathroom Home Equipment: None      Prior Function Level of Independence: Independent         Comments: community ambulator, drives     Journalist, newspaper        Extremity/Trunk Assessment   Upper Extremity Assessment Upper Extremity Assessment: Generalized weakness    Lower Extremity Assessment Lower Extremity Assessment: Generalized weakness    Cervical / Trunk Assessment Cervical / Trunk Assessment: Kyphotic  Communication   Communication: No difficulties  Cognition Arousal/Alertness: Awake/alert Behavior During Therapy: WFL for tasks assessed/performed Overall Cognitive Status: Within Functional Limits for tasks assessed                                        General Comments      Exercises     Assessment/Plan    PT Assessment Patient needs continued PT services  PT Problem List Decreased strength;Decreased activity tolerance;Decreased balance;Decreased mobility       PT Treatment Interventions Therapeutic exercise;Gait training;Stair training;Functional mobility training;Therapeutic activities;Patient/family education    PT Goals (Current goals can be found in the Care Plan section)  Acute Rehab PT Goals Patient Stated Goal: return home PT Goal Formulation: With patient Time For Goal Achievement: 05/28/18 Potential to Achieve Goals: Good    Frequency Min 3X/week   Barriers to discharge        Co-evaluation               AM-PAC PT "6 Clicks" Mobility  Outcome Measure Help needed turning from your back to your side while in a flat bed without using bedrails?: None Help needed moving from lying on your back to sitting on the side of a flat bed without using bedrails?: A Little Help needed moving to and from a bed to a chair (including a wheelchair)?: A Little Help needed  standing up from a chair using your arms (e.g., wheelchair or bedside chair)?: A Little Help needed to walk in hospital room?: A Lot Help needed climbing 3-5 steps with a railing? : A Lot 6 Click Score: 17    End of Session Equipment Utilized During Treatment: Gait belt Activity Tolerance: Patient tolerated treatment well;Patient limited by fatigue Patient left: in chair;with call bell/phone within reach;with chair alarm set Nurse Communication: Mobility status PT Visit Diagnosis: Unsteadiness on feet (R26.81);Other abnormalities of gait and mobility (R26.89);Muscle weakness (generalized) (M62.81)    Time: 9480-1655 PT Time Calculation (min) (ACUTE ONLY): 37 min   Charges:   PT Evaluation $PT Eval Moderate Complexity: 1 Mod PT Treatments $Therapeutic Activity: 23-37 mins        9:06 AM, 05/14/18 Lonell Grandchild, MPT Physical Therapist with [facility] [phone] office [phone] mobile phone

## 2018-05-14 NOTE — Care Management Important Message (Signed)
Important Message  Patient Details  Name: Darren Howard MRN: [id number] Date of Birth: 01-05-1951   Medicare Important Message Given:  Yes    [name] 05/14/2018, 2:47 PM

## 2018-05-14 NOTE — Progress Notes (Signed)
PROGRESS NOTE    Darren Howard  WRU:[id number] DOB: 17-Apr-1950 DOA: 05/13/2018 PCP: Patient, No Pcp Per   Brief Narrative:  Per HPI: Darren Howard is a 68 y.o. male with medical history significant for type 2 diabetes with medication noncompliance, dyslipidemia, hypertension, and alcohol abuse who presented to the ED with complaints of weakness and dizziness that has been ongoing for the last 2 to 3 weeks.  As a result, he cannot ambulate very well and typically crawls around.  He states that he feels that the room is spinning and has had some mild nausea, but no vomiting.  He denies any fevers or chills, chest pain, or shortness of breath.  He denies any presyncopal symptoms and is otherwise a poor historian.  No trouble with speech or swallowing noted.  He called EMS for evaluation in the ED per his son's recommendation today.  Patient apparently has not been taking his home medications as prescribed either.  He denies any blood in his stools or black tarry stools.  Patient has been evaluated by PT and noted to have symptoms that may be related more to orthostasis.  He is recommended for SNF at this point in time.  Assessment & Plan:   Principal Problem:   Dizziness Active Problems:   Hypertension   Anemia   DM2 (diabetes mellitus, type 2) ([place])  1. Dizziness- likely secondary to orthostasis.  Brain MRI with no sign of CVA noted.  Appreciate PT evaluation with recommendations for SNF on discharge.  Maintain on fall precautions.  1 L fluid bolus for now and repeat orthostatics in morning.  May repeat 1 L later today. 2. Hyperglycemia with associated hyponatremia-improved.  Placed on aggressive sliding scale insulin dosing as well as carb modified diet and hemoglobin A1c noted to be 6.9%.    Restart home metformin. 3. Normocytic anemia-worsening.  Will check stool occult as ordered by EDP-this is still pending.  Some of this is also likely dilutional. Anemia panel with mildly diminished  folic acid levels which will be replaced.  Transfuse as needed.  Avoid heparin agents at this time.  No signs of overt bleeding otherwise identified.  Started on Protonix empirically. 4. Hypertension.  Patient appears to have stable blood pressure control noted at this time, but this could be related to dehydration/hypovolemia.  Will maintain on IV fluid for now and monitor.  DVT prophylaxis: SCDs Code Status: Full Family Communication: None at bedside Disposition Plan: Plan for transfer to SNF once bed available and no longer orthostatic.  Transfuse as needed with potential need for GI evaluation of Hemoccult positive.   Consultants:   None  Procedures:   None  Antimicrobials:   None   Subjective: Patient seen and evaluated today with no new acute complaints or concerns. No acute concerns or events noted overnight.  He is resting comfortably and has not had a bowel movement as of yet.  He has been evaluated by PT with significant weakness and orthostasis noted.  Objective: Vitals:   05/13/18 1617 05/13/18 2011 05/13/18 2150 05/14/18 0507  BP: 140/83  138/79 140/88  Pulse: 97  92 100  Resp: 17  16 16   Temp: 97.7 F (36.5 C)  (!) 97.5 F (36.4 C) 98.4 F (36.9 C)  TempSrc: Oral  Oral Oral  SpO2: 100% 98% 100% 100%  Weight: 83.7 kg     Height: 6' (1.829 m)       Intake/Output Summary (Last 24 hours) at 05/14/2018 1016 Last data filed at  05/14/2018 0600 Gross per 24 hour  Intake 1537.5 ml  Output 200 ml  Net 1337.5 ml   Filed Weights   05/13/18 1617  Weight: 83.7 kg    Examination:  General exam: Appears calm and comfortable  Respiratory system: Clear to auscultation. Respiratory effort normal. Cardiovascular system: S1 & S2 heard, RRR. No JVD, murmurs, rubs, gallops or clicks. No pedal edema. Gastrointestinal system: Abdomen is nondistended, soft and nontender. No organomegaly or masses felt. Normal bowel sounds heard. Central nervous system: Alert and oriented.  No focal neurological deficits. Extremities: Symmetric 5 x 5 power. Skin: No rashes, lesions or ulcers Psychiatry: Judgement and insight appear normal. Mood & affect appropriate.     Data Reviewed: I have personally reviewed following labs and imaging studies  CBC: Recent Labs  Lab 05/13/18 1338 05/14/18 0511  WBC 8.7 7.4  NEUTROABS 7.2  --   HGB 7.7* 7.2*  HCT 22.7* 21.2*  MCV 95.0 96.4  PLT 305 332   Basic Metabolic Panel: Recent Labs  Lab 05/13/18 1338 05/14/18 0511  NA 130* 135  K 3.7 3.3*  CL 93* 99  CO2 21* 25  GLUCOSE 373* 172*  BUN 30* 27*  CREATININE 1.58* 1.22  CALCIUM 8.4* 8.2*   GFR: Estimated Creatinine Clearance: 66.3 mL/min (by C-G formula based on SCr of 1.22 mg/dL). Liver Function Tests: Recent Labs  Lab 05/13/18 1338  AST 57*  ALT 24  ALKPHOS 112  BILITOT 1.0  PROT 6.9  ALBUMIN 3.5   No results for input(s): LIPASE, AMYLASE in the last 168 hours. No results for input(s): AMMONIA in the last 168 hours. Coagulation Profile: No results for input(s): INR, PROTIME in the last 168 hours. Cardiac Enzymes: Recent Labs  Lab 05/13/18 1338  TROPONINI <0.03   BNP (last 3 results) No results for input(s): PROBNP in the last 8760 hours. HbA1C: Recent Labs    05/13/18 1330  HGBA1C 6.9*   CBG: Recent Labs  Lab 05/13/18 1311 05/13/18 1623 05/13/18 2152 05/14/18 0728  GLUCAP 360* 214* 150* 181*   Lipid Profile: No results for input(s): CHOL, HDL, LDLCALC, TRIG, CHOLHDL, LDLDIRECT in the last 72 hours. Thyroid Function Tests: No results for input(s): TSH, T4TOTAL, FREET4, T3FREE, THYROIDAB in the last 72 hours. Anemia Panel: Recent Labs    05/13/18 1330 05/14/18 0511  VITAMINB12 489  --   FOLATE  --  2.4*  FERRITIN 1,005*  --   TIBC 176*  --   IRON 151  --   RETICCTPCT 0.3*  --    Sepsis Labs: No results for input(s): PROCALCITON, LATICACIDVEN in the last 168 hours.  No results found for this or any previous visit (from the  past 240 hour(s)).       Radiology Studies: Dg Chest 2 View  Result Date: 05/13/2018 CLINICAL DATA:  Cough EXAM: CHEST - 2 VIEW COMPARISON:  07/17/2006 FINDINGS: The heart size and mediastinal contours are within normal limits. Both lungs are clear. The visualized skeletal structures are unremarkable. IMPRESSION: No active cardiopulmonary disease. Electronically Signed   By: Franchot Gallo M.D.   On: 05/13/2018 14:22   Ct Head Wo Contrast  Result Date: 05/13/2018 CLINICAL DATA:  Weakness and dizziness for several weeks EXAM: CT HEAD WITHOUT CONTRAST TECHNIQUE: Contiguous axial images were obtained from the base of the skull through the vertex without intravenous contrast. COMPARISON:  None. FINDINGS: Brain: Mild atrophic changes and chronic white matter ischemic change is identified. No findings to suggest acute hemorrhage, acute infarction  or space-occupying mass lesion seen. Few scattered lacunar infarcts are noted in the basal ganglia and thalami bilaterally. Vascular: No hyperdense vessel or unexpected calcification. Skull: Normal. Negative for fracture or focal lesion. Sinuses/Orbits: No acute finding. Other: None. IMPRESSION: Chronic atrophic and ischemic changes without acute abnormality. Electronically Signed   By: Inez Catalina M.D.   On: 05/13/2018 14:18   Mr Brain Wo Contrast  Result Date: 05/13/2018 CLINICAL DATA:  Three-week history of dizziness and gait disturbance. EXAM: MRI HEAD WITHOUT CONTRAST TECHNIQUE: Multiplanar, multiecho pulse sequences of the brain and surrounding structures were obtained without intravenous contrast. COMPARISON:  Head CT 05/13/2018 FINDINGS: Brain: Diffusion imaging does not show any acute or subacute infarction. The brainstem and cerebellum are normal. There is an old lacunar infarction in the right thalamus and in both basal ganglia and radiating white matter tracts. No large vessel territory infarction. No mass lesion, hemorrhage, hydrocephalus or  extra-axial collection. Vascular: Major vessels at the base of the brain show flow. Skull and upper cervical spine: Negative Sinuses/Orbits: Clear/normal Other: None IMPRESSION: Motion degraded exam. No acute finding. 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 Electronically Signed   By: Nelson Chimes M.D.   On: 05/13/2018 15:20        Scheduled Meds: . folic acid  1 mg Oral Daily  . insulin aspart  0-15 Units Subcutaneous TID WC  . insulin aspart  0-5 Units Subcutaneous QHS  . multivitamin with minerals  1 tablet Oral Daily  . pantoprazole  40 mg Oral Daily  . thiamine  100 mg Oral Daily   Or  . thiamine  100 mg Intravenous Daily   Continuous Infusions: . sodium chloride 75 mL/hr at 05/13/18 1622  . sodium chloride    . sodium chloride       LOS: 1 day    Time spent: 30 minutes    Levonne Carreras Darleen Crocker, DO [facility] Pager [phone]  If 7PM-7AM, please contact night-coverage [email or web address] Password [facility] 05/14/2018, 10:16 AM

## 2018-05-15 LAB — BASIC METABOLIC PANEL
Anion gap: 12 (ref 5–15)
BUN: 22 mg/dL (ref 8–23)
CHLORIDE: 105 mmol/L (ref 98–111)
CO2: 20 mmol/L — AB (ref 22–32)
Calcium: 7.9 mg/dL — ABNORMAL LOW (ref 8.9–10.3)
Creatinine, Ser: 1.19 mg/dL (ref 0.61–1.24)
GFR calc Af Amer: >60 mL/min (ref >60)
GFR calc non Af Amer: >60 mL/min (ref >60)
Glucose, Bld: 164 mg/dL — ABNORMAL HIGH (ref 70–99)
Potassium: 3.5 mmol/L (ref 3.5–5.1)
Sodium: 137 mmol/L (ref 135–145)

## 2018-05-15 LAB — HEMOGLOBIN AND HEMATOCRIT, BLOOD
HCT: 22.5 % — ABNORMAL LOW (ref 39.0–52.0)
Hemoglobin: 7.6 g/dL — ABNORMAL LOW (ref 13.0–17.0)

## 2018-05-15 LAB — GLUCOSE, CAPILLARY
GLUCOSE-CAPILLARY: 124 mg/dL — AB (ref 70–99)
Glucose-Capillary: 180 mg/dL — ABNORMAL HIGH (ref 70–99)
Glucose-Capillary: 139 mg/dL — ABNORMAL HIGH (ref 70–99)
Glucose-Capillary: 172 mg/dL — ABNORMAL HIGH (ref 70–99)

## 2018-05-15 LAB — CBC
HEMATOCRIT: 20.2 % — AB (ref 39.0–52.0)
Hemoglobin: 6.6 g/dL — CL (ref 13.0–17.0)
MCH: 32.4 pg (ref 26.0–34.0)
MCHC: 32.7 g/dL (ref 30.0–36.0)
MCV: 99 fL (ref 80.0–100.0)
Platelets: 237 10*3/uL (ref 150–400)
RBC: 2.04 MIL/uL — ABNORMAL LOW (ref 4.22–5.81)
RDW: 15.7 % — ABNORMAL HIGH (ref 11.5–15.5)
WBC: 6.9 10*3/uL (ref 4.0–10.5)
nRBC: 0 % (ref 0.0–0.2)

## 2018-05-15 LAB — ABO/RH: ABO/RH(D): B POS

## 2018-05-15 LAB — HIV ANTIBODY (ROUTINE TESTING W REFLEX): HIV Screen 4th Generation wRfx: NONREACTIVE

## 2018-05-15 LAB — PREPARE RBC (CROSSMATCH)

## 2018-05-15 MED ORDER — SODIUM CHLORIDE 0.9% IV SOLUTION: Freq: Once | INTRAVENOUS | Status: DC

## 2018-05-15 MED ORDER — PANTOPRAZOLE SODIUM 40 MG IV SOLR
40.0000 mg | Freq: Two times a day (BID) | INTRAVENOUS | Status: DC
  Administered 2018-05-15 – 2018-05-17 (×4): 40 mg via INTRAVENOUS
  Filled 2018-05-15 (×4): qty 40

## 2018-05-15 NOTE — Progress Notes (Signed)
CRITICAL VALUE ALERT  Critical Value: hemoglobin 6.6  Date & Time Notied: 05-15-18 0640  Provider Notified:c woods, md  Orders Received/Actions taken: no orders received at this time.  Will pass results on to day shift.

## 2018-05-15 NOTE — Progress Notes (Signed)
Physical Therapy Treatment Patient Details Name: Darren Howard MRN: [id number] DOB: 1950/06/09 Today's Date: 05/15/2018    History of Present Illness Darren Howard is a 68 y.o. male with medical history significant for type 2 diabetes with medication noncompliance, dyslipidemia, hypertension, and alcohol abuse who presented to the ED with complaints of weakness and dizziness that has been ongoing for the last 2 to 3 weeks.  As a result, he cannot ambulate very well and typically crawls around.  He states that he feels that the room is spinning and has had some mild nausea, but no vomiting.  He denies any fevers or chills, chest pain, or shortness of breath.  He denies any presyncopal symptoms and is otherwise a poor historian.  No trouble with speech or swallowing noted.  He called EMS for evaluation in the ED per his son's recommendation today.  Patient apparently has not been taking his home medications as prescribed either.  He denies any blood in his stools or black tarry stools.    PT Comments    Patient c/o mild dizziness when sitting up/standing, tolerated standing up at bedside using RW while being cleaned by nursing staff due incontinence of bowel and demonstrated slightly increased endurance/distance for ambulation.  Patient put back to bed after therapy secondary to receive blood transfusion.  Patient will benefit from continued physical therapy in hospital and recommended venue below to increase strength, balance, endurance for safe ADLs and gait.    Follow Up Recommendations  SNF;Supervision/Assistance - 24 hour     Equipment Recommendations  Rolling walker with 5" wheels    Recommendations for Other Services       Precautions / Restrictions Precautions Precautions: Fall Restrictions Weight Bearing Restrictions: No    Mobility  Bed Mobility Overal bed mobility: Needs Assistance Bed Mobility: Supine to Sit;Sit to Supine     Supine to sit: Supervision Sit to supine:  Supervision   General bed mobility comments: labored movement  Transfers Overall transfer level: Needs assistance Equipment used: Rolling walker (2 wheeled) Transfers: Sit to/from [facility] Sit to Stand: Min assist Stand pivot transfers: Min assist       General transfer comment: labored movement, fair/poor standing balance  Ambulation/Gait Ambulation/Gait assistance: Min assist Gait Distance (Feet): 25 Feet Assistive device: Rolling walker (2 wheeled) Gait Pattern/deviations: Decreased step length - right;Decreased step length - left;Decreased stride length Gait velocity: decreased   General Gait Details: slow labored cadence with slightly increased endurance/distance without loss of balance, mild c/o dizziness, limited secondary to c/o fatigue   Stairs             Wheelchair Mobility    Modified Rankin (Stroke Patients Only)       Balance Overall balance assessment: Needs assistance Sitting-balance support: Feet supported Sitting balance-Leahy Scale: Fair     Standing balance support: During functional activity;Bilateral upper extremity supported Standing balance-Leahy Scale: Fair Standing balance comment: using RW                            Cognition Arousal/Alertness: Awake/alert Behavior During Therapy: WFL for tasks assessed/performed Overall Cognitive Status: Within Functional Limits for tasks assessed                                        Exercises General Exercises - Lower Extremity Long Arc Quad: Seated;AROM;Strengthening;Both;10 reps Hip Flexion/Marching:  Seated;Strengthening;AROM;Both;10 reps Toe Raises: Seated;Strengthening;AROM;Both;10 reps Heel Raises: Seated;AROM;Strengthening;Both;10 reps    General Comments        Pertinent Vitals/Pain Pain Assessment: No/denies pain    Home Living                      Prior Function            PT Goals (current goals can now be  found in the care plan section) Acute Rehab PT Goals PT Goal Formulation: With patient Time For Goal Achievement: 05/28/18 Potential to Achieve Goals: Good Progress towards PT goals: Progressing toward goals    Frequency    Min 3X/week      PT Plan Current plan remains appropriate    Co-evaluation              AM-PAC PT "6 Clicks" Mobility   Outcome Measure  Help needed turning from your back to your side while in a flat bed without using bedrails?: None Help needed moving from lying on your back to sitting on the side of a flat bed without using bedrails?: None Help needed moving to and from a bed to a chair (including a wheelchair)?: A Little Help needed standing up from a chair using your arms (e.g., wheelchair or bedside chair)?: A Little Help needed to walk in hospital room?: A Lot Help needed climbing 3-5 steps with a railing? : A Lot 6 Click Score: 18    End of Session Equipment Utilized During Treatment: Gait belt Activity Tolerance: Patient tolerated treatment well;Patient limited by fatigue Patient left: in bed;with call bell/phone within reach;with bed alarm set Nurse Communication: Mobility status PT Visit Diagnosis: Unsteadiness on feet (R26.81);Other abnormalities of gait and mobility (R26.89);Muscle weakness (generalized) (M62.81)     Time: 5929-2446 PT Time Calculation (min) (ACUTE ONLY): 25 min  Charges:  $Therapeutic Exercise: 8-22 mins $Therapeutic Activity: 8-22 mins                     12:26 PM, 05/15/18 Lonell Grandchild, MPT Physical Therapist with [facility] 336 [phone] office [phone] mobile phone

## 2018-05-15 NOTE — Progress Notes (Signed)
PROGRESS NOTE    Darren Howard  XTK:[id number] DOB: February 10, 1951 DOA: 05/13/2018 PCP: [name], MD   Brief Narrative:  Per HPI: Darren Howard a 68 y.o.malewith medical history significant fortype 2 diabetes with medication noncompliance, dyslipidemia, hypertension, and alcohol abuse who presented to the ED with complaints of weakness and dizziness that has been ongoing for the last 2 to 3 weeks. As a result, he cannot ambulate very well and typically crawls around. He states that he feels that the room is spinning and has had some mild nausea, but no vomiting. He denies any fevers or chills, chest pain, or shortness of breath. He denies any presyncopal symptoms and is otherwise a poor historian. No trouble with speech or swallowing noted. He called EMS for evaluation in the ED per his son's recommendation today. Patient apparently has not been taking his home medications as prescribed either. He denies any blood in his stools or black tarry stools.  Patient has been evaluated by PT and noted to have symptoms that may be related more to orthostasis.  He is recommended for SNF at this point in time.  Assessment & Plan:   Principal Problem:   Dizziness Active Problems:   Hypertension   Anemia   DM2 (diabetes mellitus, type 2) ([place])  1. Dizziness- likely secondary to orthostasis. Brain MRI with no sign of CVA noted.  Appreciate PT evaluation with recommendations for SNF on discharge.  Maintain on fall precautions.   2. Acute blood loss anemia.  FOBT noted to be positive.  Will monitor on repeat H&H and CBC and transfuse 1 unit PRBC today.  Consult to GI by Monday and transfer earlier as needed.  PPI twice daily ordered as well as liquid diet. 3. Hyperglycemia with associated hyponatremia-improved. Placed on aggressive sliding scale insulin dosing as well as carb modified diet and hemoglobin A1c noted to be 6.9%.   Restart home metformin. 4. Hypertension. Patient appears to  have stable blood pressure control noted at this time,but this could be related to dehydration/hypovolemia. Will maintain on IV fluid for now and monitor.  DVT prophylaxis: SCDs Code Status: Full Family Communication:  Spoke with son on phone Disposition Plan:  Plan for transfusion and monitoring of CBC and vitals.  Transfer if overt hemorrhaging noted for GI evaluation.  Will consult by Monday.  Protonix IV twice daily ordered.   Consultants:   None  Procedures:   None  Antimicrobials:   None  Subjective: Patient seen and evaluated today with no new acute complaints or concerns. No acute concerns or events noted overnight.  Objective: Vitals:   05/14/18 2123 05/15/18 0525 05/15/18 0531 05/15/18 0546  BP: (!) 142/80 137/81 110/65 (!) 99/59  Pulse: (!) 105 100 (!) 119 (!) 141  Resp: 16 18 18 18   Temp: 98.4 F (36.9 C) 97.8 F (36.6 C) 98.7 F (37.1 C)   TempSrc: Oral Oral Oral   SpO2: 100% 100% 100% (!) 65%  Weight:      Height:        Intake/Output Summary (Last 24 hours) at 05/15/2018 0957 Last data filed at 05/15/2018 0600 Gross per 24 hour  Intake 960 ml  Output 700 ml  Net 260 ml   Filed Weights   05/13/18 1617  Weight: 83.7 kg    Examination:  General exam: Appears calm and comfortable  Respiratory system: Clear to auscultation. Respiratory effort normal. Cardiovascular system: S1 & S2 heard, RRR. No JVD, murmurs, rubs, gallops or clicks. No pedal edema. Gastrointestinal  system: Abdomen is nondistended, soft and nontender. No organomegaly or masses felt. Normal bowel sounds heard. Central nervous system: Alert and oriented. No focal neurological deficits. Extremities: Symmetric 5 x 5 power. Skin: No rashes, lesions or ulcers Psychiatry: Judgement and insight appear normal. Mood & affect appropriate.     Data Reviewed: I have personally reviewed following labs and imaging studies  CBC: Recent Labs  Lab 05/13/18 1338 05/14/18 0511  05/15/18 0617  WBC 8.7 7.4 6.9  NEUTROABS 7.2  --   --   HGB 7.7* 7.2* 6.6*  HCT 22.7* 21.2* 20.2*  MCV 95.0 96.4 99.0  PLT 305 260 885   Basic Metabolic Panel: Recent Labs  Lab 05/13/18 1338 05/14/18 0511 05/15/18 0617  NA 130* 135 137  K 3.7 3.3* 3.5  CL 93* 99 105  CO2 21* 25 20*  GLUCOSE 373* 172* 164*  BUN 30* 27* 22  CREATININE 1.58* 1.22 1.19  CALCIUM 8.4* 8.2* 7.9*   GFR: Estimated Creatinine Clearance: 67.9 mL/min (by C-G formula based on SCr of 1.19 mg/dL). Liver Function Tests: Recent Labs  Lab 05/13/18 1338  AST 57*  ALT 24  ALKPHOS 112  BILITOT 1.0  PROT 6.9  ALBUMIN 3.5   No results for input(s): LIPASE, AMYLASE in the last 168 hours. No results for input(s): AMMONIA in the last 168 hours. Coagulation Profile: No results for input(s): INR, PROTIME in the last 168 hours. Cardiac Enzymes: Recent Labs  Lab 05/13/18 1338  TROPONINI <0.03   BNP (last 3 results) No results for input(s): PROBNP in the last 8760 hours. HbA1C: Recent Labs    05/13/18 1330  HGBA1C 6.9*   CBG: Recent Labs  Lab 05/14/18 0728 05/14/18 1111 05/14/18 1655 05/14/18 2126 05/15/18 0743  GLUCAP 181* 181* 208* 160* 180*   Lipid Profile: No results for input(s): CHOL, HDL, LDLCALC, TRIG, CHOLHDL, LDLDIRECT in the last 72 hours. Thyroid Function Tests: No results for input(s): TSH, T4TOTAL, FREET4, T3FREE, THYROIDAB in the last 72 hours. Anemia Panel: Recent Labs    05/13/18 1330 05/14/18 0511  VITAMINB12 489  --   FOLATE  --  2.4*  FERRITIN 1,005*  --   TIBC 176*  --   IRON 151  --   RETICCTPCT 0.3*  --    Sepsis Labs: No results for input(s): PROCALCITON, LATICACIDVEN in the last 168 hours.  No results found for this or any previous visit (from the past 240 hour(s)).       Radiology Studies: Dg Chest 2 View  Result Date: 05/13/2018 CLINICAL DATA:  Cough EXAM: CHEST - 2 VIEW COMPARISON:  07/17/2006 FINDINGS: The heart size and mediastinal contours  are within normal limits. Both lungs are clear. The visualized skeletal structures are unremarkable. IMPRESSION: No active cardiopulmonary disease. Electronically Signed   By: Franchot Gallo M.D.   On: 05/13/2018 14:22   Ct Head Wo Contrast  Result Date: 05/13/2018 CLINICAL DATA:  Weakness and dizziness for several weeks EXAM: CT HEAD WITHOUT CONTRAST TECHNIQUE: Contiguous axial images were obtained from the base of the skull through the vertex without intravenous contrast. COMPARISON:  None. FINDINGS: Brain: Mild atrophic changes and chronic white matter ischemic change is identified. No findings to suggest acute hemorrhage, acute infarction or space-occupying mass lesion seen. Few scattered lacunar infarcts are noted in the basal ganglia and thalami bilaterally. Vascular: No hyperdense vessel or unexpected calcification. Skull: Normal. Negative for fracture or focal lesion. Sinuses/Orbits: No acute finding. Other: None. IMPRESSION: Chronic atrophic and ischemic changes  without acute abnormality. Electronically Signed   By: Inez Catalina M.D.   On: 05/13/2018 14:18   Mr Brain Wo Contrast  Result Date: 05/13/2018 CLINICAL DATA:  Three-week history of dizziness and gait disturbance. EXAM: MRI HEAD WITHOUT CONTRAST TECHNIQUE: Multiplanar, multiecho pulse sequences of the brain and surrounding structures were obtained without intravenous contrast. COMPARISON:  Head CT 05/13/2018 FINDINGS: Brain: Diffusion imaging does not show any acute or subacute infarction. The brainstem and cerebellum are normal. There is an old lacunar infarction in the right thalamus and in both basal ganglia and radiating white matter tracts. No large vessel territory infarction. No mass lesion, hemorrhage, hydrocephalus or extra-axial collection. Vascular: Major vessels at the base of the brain show flow. Skull and upper cervical spine: Negative Sinuses/Orbits: Clear/normal Other: None IMPRESSION: Motion degraded exam. No acute finding.  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 Electronically Signed   By: Nelson Chimes M.D.   On: 05/13/2018 15:20        Scheduled Meds: . sodium chloride   Intravenous Once  . sodium chloride   Intravenous Once  . atorvastatin  10 mg Oral QHS  . folic acid  1 mg Oral Daily  . insulin aspart  0-15 Units Subcutaneous TID WC  . insulin aspart  0-5 Units Subcutaneous QHS  . multivitamin with minerals  1 tablet Oral Daily  . pantoprazole (PROTONIX) IV  40 mg Intravenous Q12H  . thiamine  100 mg Oral Daily   Or  . thiamine  100 mg Intravenous Daily   Continuous Infusions: . sodium chloride 75 mL/hr at 05/14/18 1736  . sodium chloride       LOS: 2 days    Time spent: 30 minutes    Vester Balthazor Darleen Crocker, DO [facility] Pager [phone]  If 7PM-7AM, please contact night-coverage [email or web address] Password [facility] 05/15/2018, 9:57 AM

## 2018-05-16 LAB — BASIC METABOLIC PANEL
Anion gap: 7 (ref 5–15)
BUN: 17 mg/dL (ref 8–23)
CALCIUM: 7.4 mg/dL — AB (ref 8.9–10.3)
CO2: 22 mmol/L (ref 22–32)
Chloride: 106 mmol/L (ref 98–111)
Creatinine, Ser: 0.96 mg/dL (ref 0.61–1.24)
GFR calc Af Amer: >60 mL/min (ref >60)
GFR calc non Af Amer: >60 mL/min (ref >60)
Glucose, Bld: 139 mg/dL — ABNORMAL HIGH (ref 70–99)
Potassium: 3.5 mmol/L (ref 3.5–5.1)
Sodium: 135 mmol/L (ref 135–145)

## 2018-05-16 LAB — GLUCOSE, CAPILLARY
Glucose-Capillary: 110 mg/dL — ABNORMAL HIGH (ref 70–99)
Glucose-Capillary: 226 mg/dL — ABNORMAL HIGH (ref 70–99)
Glucose-Capillary: 186 mg/dL — ABNORMAL HIGH (ref 70–99)
Glucose-Capillary: 157 mg/dL — ABNORMAL HIGH (ref 70–99)

## 2018-05-16 LAB — CBC
HCT: 21.2 % — ABNORMAL LOW (ref 39.0–52.0)
Hemoglobin: 7 g/dL — ABNORMAL LOW (ref 13.0–17.0)
MCH: 32.1 pg (ref 26.0–34.0)
MCHC: 33 g/dL (ref 30.0–36.0)
MCV: 97.2 fL (ref 80.0–100.0)
Platelets: 209 10*3/uL (ref 150–400)
RBC: 2.18 MIL/uL — ABNORMAL LOW (ref 4.22–5.81)
RDW: 16 % — AB (ref 11.5–15.5)
WBC: 7.6 10*3/uL (ref 4.0–10.5)
nRBC: 0 % (ref 0.0–0.2)

## 2018-05-16 LAB — HEMOGLOBIN AND HEMATOCRIT, BLOOD
HCT: 22.8 % — ABNORMAL LOW (ref 39.0–52.0)
Hemoglobin: 8 g/dL — ABNORMAL LOW (ref 13.0–17.0)

## 2018-05-16 MED ORDER — SODIUM CHLORIDE 0.9% IV SOLUTION: Freq: Once | INTRAVENOUS | Status: DC

## 2018-05-16 NOTE — Progress Notes (Signed)
Spoke with Dr. Manuella Ghazi, he stated to hold second unit of blood since pt hgb now 7.6.

## 2018-05-16 NOTE — Progress Notes (Signed)
Physical Therapy Treatment Patient Details Name: Darren Howard MRN: [id number] DOB: 03/31/1950 Today's Date: 05/16/2018    History of Present Illness Darren Howard is a 68 y.o. male with medical history significant for type 2 diabetes with medication noncompliance, dyslipidemia, hypertension, and alcohol abuse who presented to the ED with complaints of weakness and dizziness that has been ongoing for the last 2 to 3 weeks.  As a result, he cannot ambulate very well and typically crawls around.  He states that he feels that the room is spinning and has had some mild nausea, but no vomiting.  He denies any fevers or chills, chest pain, or shortness of breath.  He denies any presyncopal symptoms and is otherwise a poor historian.  No trouble with speech or swallowing noted.  He called EMS for evaluation in the ED per his son's recommendation today.  Patient apparently has not been taking his home medications as prescribed either.  He denies any blood in his stools or black tarry stools.    PT Comments    Patient demonstrates increased endurance/distance for gait training with occasional near loss of balance, continues to require use of RW for safety, able to transfer to commode in bathroom with use of grab bar due to fair/poor standing balance, and requested to go back to bed after sitting up for most of morning.  Patient will benefit from continued physical therapy in hospital and recommended venue below to increase strength, balance, endurance for safe ADLs and gait.    Follow Up Recommendations  SNF;Supervision/Assistance - 24 hour     Equipment Recommendations       Recommendations for Other Services       Precautions / Restrictions Precautions Precautions: Fall Restrictions Weight Bearing Restrictions: No    Mobility  Bed Mobility Overal bed mobility: Needs Assistance Bed Mobility: Sit to Supine       Sit to supine: Supervision   General bed mobility comments: Patient  presents up in chair, assisted by nursing staff  Transfers Overall transfer level: Needs assistance Equipment used: Rolling walker (2 wheeled) Transfers: Sit to/from [facility] Sit to Stand: Min assist Stand pivot transfers: Min assist       General transfer comment: had to use grab bars to transfer to commode in bathroom due to weakness, fair/poor standing balance  Ambulation/Gait Ambulation/Gait assistance: Min assist;Min guard Gait Distance (Feet): 60 Feet Assistive device: Rolling walker (2 wheeled) Gait Pattern/deviations: Decreased step length - right;Decreased step length - left;Decreased stride length Gait velocity: decreased   General Gait Details: increased endurance/distance for ambulation with slow slightly labored unsteady cadence, no loss of balance, limited secondary to fatigue, mild c/o dizziness   Stairs             Wheelchair Mobility    Modified Rankin (Stroke Patients Only)       Balance Overall balance assessment: Needs assistance Sitting-balance support: Feet supported;No upper extremity supported Sitting balance-Leahy Scale: Good     Standing balance support: During functional activity;Bilateral upper extremity supported Standing balance-Leahy Scale: Fair Standing balance comment: using RW                            Cognition Arousal/Alertness: Awake/alert Behavior During Therapy: WFL for tasks assessed/performed Overall Cognitive Status: Within Functional Limits for tasks assessed  Exercises General Exercises - Lower Extremity Long Arc Quad: Seated;AROM;Strengthening;Both;10 reps Hip Flexion/Marching: Seated;Strengthening;AROM;Both;10 reps Toe Raises: Seated;Strengthening;AROM;Both;10 reps Heel Raises: Seated;AROM;Strengthening;Both;10 reps    General Comments        Pertinent Vitals/Pain Pain Assessment: No/denies pain    Home Living                       Prior Function            PT Goals (current goals can now be found in the care plan section) Acute Rehab PT Goals Patient Stated Goal: return home PT Goal Formulation: With patient Time For Goal Achievement: 05/28/18 Potential to Achieve Goals: Good Progress towards PT goals: Progressing toward goals    Frequency    Min 3X/week      PT Plan Current plan remains appropriate    Co-evaluation              AM-PAC PT "6 Clicks" Mobility   Outcome Measure  Help needed turning from your back to your side while in a flat bed without using bedrails?: None Help needed moving from lying on your back to sitting on the side of a flat bed without using bedrails?: None Help needed moving to and from a bed to a chair (including a wheelchair)?: A Little Help needed standing up from a chair using your arms (e.g., wheelchair or bedside chair)?: A Little Help needed to walk in hospital room?: A Little Help needed climbing 3-5 steps with a railing? : A Lot 6 Click Score: 19    End of Session Equipment Utilized During Treatment: Gait belt Activity Tolerance: Patient tolerated treatment well;Patient limited by fatigue Patient left: with call bell/phone within reach;in bed Nurse Communication: Mobility status PT Visit Diagnosis: Unsteadiness on feet (R26.81);Other abnormalities of gait and mobility (R26.89);Muscle weakness (generalized) (M62.81)     Time: 0623-7628 PT Time Calculation (min) (ACUTE ONLY): 30 min  Charges:  $Gait Training: 8-22 mins $Therapeutic Exercise: 8-22 mins                     12:58 PM, 05/16/18 Darren Howard, MPT Physical Therapist with [facility] [phone] office [phone] mobile phone

## 2018-05-16 NOTE — Progress Notes (Signed)
PROGRESS NOTE    Darren Howard  WSF:[id number] DOB: 1950-07-27 DOA: 05/13/2018 PCP: [name], MD   Brief Narrative:  Per HPI: Darren Howard a 68 y.o.malewith medical history significant fortype 2 diabetes with medication noncompliance, dyslipidemia, hypertension, and alcohol abuse who presented to the ED with complaints of weakness and dizziness that has been ongoing for the last 2 to 3 weeks. As a result, he cannot ambulate very well and typically crawls around. He states that he feels that the room is spinning and has had some mild nausea, but no vomiting. He denies any fevers or chills, chest pain, or shortness of breath. He denies any presyncopal symptoms and is otherwise a poor historian. No trouble with speech or swallowing noted. He called EMS for evaluation in the ED per his son's recommendation today. Patient apparently has not been taking his home medications as prescribed either. He denies any blood in his stools or black tarry stools.  Patient is noted to have acute blood loss anemia and has undergone transfusion with 1 unit on 3/28 and will require repeat transfusion on 3/29.  Hemoccult positive.  GI consulted for further evaluation via endoscopy on 3/30.  Patient has been evaluated by PT and noted to have symptoms that may be related more to orthostasis. He is recommended for SNF at this point in time.   Assessment & Plan:   Principal Problem:   Dizziness Active Problems:   Hypertension   Anemia   DM2 (diabetes mellitus, type 2) ([place])   1. Dizziness-likely secondary to orthostasis. Brain MRI with no sign of CVA noted.Appreciate PT evaluation with recommendations for SNF on discharge. Maintain on fall precautions.   2. Acute blood loss anemia.  FOBT noted to be positive.    Repeat 1 more unit PRBC transfusion today for total 2 units thus far.  Follow CBC.  Maintain on Protonix IV twice daily and consult to GI in a.m.  Hemodynamically stable with no  overt bleeding noted and therefore, no need for transfer at this time. 3. Hyperglycemia with associated hyponatremia-improved. Placed on aggressive sliding scale insulin dosing as well as carb modified diet andhemoglobin A1c noted to be 6.9%. 4. Hypertension. Patient appears to have stable blood pressure control noted at this time,but this could be related to dehydration/hypovolemia. Will maintain on IV fluid for now and monitor.  DVT prophylaxis:SCDs Code Status:Full Family Communication: Spoke with son on phone on 3/28 Disposition Plan:  Repeat PRBC transfusion today with monitoring of CBC in a.m.  Continue on Protonix and soft diet for now with n.p.o. after midnight.  GI evaluation in a.m.   Consultants:  None  Procedures:  None  Antimicrobials:   None  Subjective: Patient seen and evaluated today with no new acute complaints or concerns. No acute concerns or events noted overnight.  He has had a bowel movement overnight which was noted by nursing staff to be brown with no overt bleeding noted.  Objective: Vitals:   05/15/18 1446 05/15/18 1447 05/15/18 2215 05/16/18 0518  BP: 102/64 99/73 123/72 132/69  Pulse: 97 (!) 118 94 91  Resp:   18 16  Temp:   98.8 F (37.1 C) 98.3 F (36.8 C)  TempSrc:   Oral Oral  SpO2:   100% 100%  Weight:      Height:        Intake/Output Summary (Last 24 hours) at 05/16/2018 0828 Last data filed at 05/16/2018 0500 Gross per 24 hour  Intake 1478 ml  Output 550 ml  Net 928 ml   Filed Weights   05/13/18 1617  Weight: 83.7 kg    Examination:  General exam: Appears calm and comfortable  Respiratory system: Clear to auscultation. Respiratory effort normal. Cardiovascular system: S1 & S2 heard, RRR. No JVD, murmurs, rubs, gallops or clicks. No pedal edema. Gastrointestinal system: Abdomen is nondistended, soft and nontender. No organomegaly or masses felt. Normal bowel sounds heard. Central nervous system: Alert and  oriented. No focal neurological deficits. Extremities: Symmetric 5 x 5 power. Skin: No rashes, lesions or ulcers Psychiatry: Judgement and insight appear normal. Mood & affect appropriate.     Data Reviewed: I have personally reviewed following labs and imaging studies  CBC: Recent Labs  Lab 05/13/18 1338 05/14/18 0511 05/15/18 0617 05/15/18 1531 05/16/18 0730  WBC 8.7 7.4 6.9  --  7.6  NEUTROABS 7.2  --   --   --   --   HGB 7.7* 7.2* 6.6* 7.6* 7.0*  HCT 22.7* 21.2* 20.2* 22.5* 21.2*  MCV 95.0 96.4 99.0  --  97.2  PLT 305 260 237  --  557   Basic Metabolic Panel: Recent Labs  Lab 05/13/18 1338 05/14/18 0511 05/15/18 0617 05/16/18 0730  NA 130* 135 137 135  K 3.7 3.3* 3.5 3.5  CL 93* 99 105 106  CO2 21* 25 20* 22  GLUCOSE 373* 172* 164* 139*  BUN 30* 27* 22 17  CREATININE 1.58* 1.22 1.19 0.96  CALCIUM 8.4* 8.2* 7.9* 7.4*   GFR: Estimated Creatinine Clearance: 84.2 mL/min (by C-G formula based on SCr of 0.96 mg/dL). Liver Function Tests: Recent Labs  Lab 05/13/18 1338  AST 57*  ALT 24  ALKPHOS 112  BILITOT 1.0  PROT 6.9  ALBUMIN 3.5   No results for input(s): LIPASE, AMYLASE in the last 168 hours. No results for input(s): AMMONIA in the last 168 hours. Coagulation Profile: No results for input(s): INR, PROTIME in the last 168 hours. Cardiac Enzymes: Recent Labs  Lab 05/13/18 1338  TROPONINI <0.03   BNP (last 3 results) No results for input(s): PROBNP in the last 8760 hours. HbA1C: Recent Labs    05/13/18 1330  HGBA1C 6.9*   CBG: Recent Labs  Lab 05/15/18 0743 05/15/18 1138 05/15/18 1638 05/15/18 2158 05/16/18 0748  GLUCAP 180* 139* 124* 172* 110*   Lipid Profile: No results for input(s): CHOL, HDL, LDLCALC, TRIG, CHOLHDL, LDLDIRECT in the last 72 hours. Thyroid Function Tests: No results for input(s): TSH, T4TOTAL, FREET4, T3FREE, THYROIDAB in the last 72 hours. Anemia Panel: Recent Labs    05/13/18 1330 05/14/18 0511  VITAMINB12  489  --   FOLATE  --  2.4*  FERRITIN 1,005*  --   TIBC 176*  --   IRON 151  --   RETICCTPCT 0.3*  --    Sepsis Labs: No results for input(s): PROCALCITON, LATICACIDVEN in the last 168 hours.  No results found for this or any previous visit (from the past 240 hour(s)).       Radiology Studies: No results found.      Scheduled Meds: . sodium chloride   Intravenous Once  . sodium chloride   Intravenous Once  . sodium chloride   Intravenous Once  . atorvastatin  10 mg Oral QHS  . folic acid  1 mg Oral Daily  . insulin aspart  0-15 Units Subcutaneous TID WC  . insulin aspart  0-5 Units Subcutaneous QHS  . multivitamin with minerals  1 tablet Oral Daily  . pantoprazole (  PROTONIX) IV  40 mg Intravenous Q12H  . thiamine  100 mg Oral Daily   Or  . thiamine  100 mg Intravenous Daily   Continuous Infusions: . sodium chloride 75 mL/hr at 05/14/18 1736  . sodium chloride       LOS: 3 days    Time spent: 30 minutes     Darleen Crocker, DO [facility] Pager [phone]  If 7PM-7AM, please contact night-coverage [email or web address] Password [facility] 05/16/2018, 8:28 AM

## 2018-05-17 ENCOUNTER — Inpatient Hospital Stay (HOSPITAL_COMMUNITY): Payer: Medicare Other | Admitting: Anesthesiology

## 2018-05-17 ENCOUNTER — Encounter (HOSPITAL_COMMUNITY)
Admission: EM | Disposition: A | Discharge status: Skilled Nursing Facility | Payer: Self-pay | Source: Home / Self Care | Attending: Internal Medicine

## 2018-05-17 ENCOUNTER — Other Ambulatory Visit: Payer: Self-pay

## 2018-05-17 ENCOUNTER — Encounter (HOSPITAL_COMMUNITY): Payer: Self-pay | Admitting: Gastroenterology

## 2018-05-17 DIAGNOSIS — R1013 Epigastric pain: Secondary | ICD-10-CM

## 2018-05-17 DIAGNOSIS — K3189 Other diseases of stomach and duodenum: Secondary | ICD-10-CM

## 2018-05-17 DIAGNOSIS — R195 Other fecal abnormalities: Secondary | ICD-10-CM

## 2018-05-17 DIAGNOSIS — D649 Anemia, unspecified: Secondary | ICD-10-CM

## 2018-05-17 DIAGNOSIS — K269 Duodenal ulcer, unspecified as acute or chronic, without hemorrhage or perforation: Secondary | ICD-10-CM

## 2018-05-17 HISTORY — PX: BIOPSY: SHX5522

## 2018-05-17 HISTORY — PX: ESOPHAGOGASTRODUODENOSCOPY (EGD) WITH PROPOFOL: SHX5813

## 2018-05-17 LAB — GLUCOSE, CAPILLARY
GLUCOSE-CAPILLARY: 152 mg/dL — AB (ref 70–99)
Glucose-Capillary: 121 mg/dL — ABNORMAL HIGH (ref 70–99)
Glucose-Capillary: 104 mg/dL — ABNORMAL HIGH (ref 70–99)
Glucose-Capillary: 103 mg/dL — ABNORMAL HIGH (ref 70–99)
Glucose-Capillary: 162 mg/dL — ABNORMAL HIGH (ref 70–99)

## 2018-05-17 LAB — CBC
HCT: 23 % — ABNORMAL LOW (ref 39.0–52.0)
Hemoglobin: 7.8 g/dL — ABNORMAL LOW (ref 13.0–17.0)
MCH: 31.8 pg (ref 26.0–34.0)
MCHC: 33.9 g/dL (ref 30.0–36.0)
MCV: 93.9 fL (ref 80.0–100.0)
Platelets: 201 10*3/uL (ref 150–400)
RBC: 2.45 MIL/uL — ABNORMAL LOW (ref 4.22–5.81)
RDW: 15.7 % — ABNORMAL HIGH (ref 11.5–15.5)
WBC: 9.3 10*3/uL (ref 4.0–10.5)
nRBC: 0.3 % — ABNORMAL HIGH (ref 0.0–0.2)

## 2018-05-17 LAB — BASIC METABOLIC PANEL
Anion gap: 8 (ref 5–15)
BUN: 19 mg/dL (ref 8–23)
CO2: 20 mmol/L — ABNORMAL LOW (ref 22–32)
Calcium: 6.9 mg/dL — ABNORMAL LOW (ref 8.9–10.3)
Chloride: 108 mmol/L (ref 98–111)
Creatinine, Ser: 1.04 mg/dL (ref 0.61–1.24)
GFR calc Af Amer: >60 mL/min (ref >60)
GFR calc non Af Amer: >60 mL/min (ref >60)
Glucose, Bld: 116 mg/dL — ABNORMAL HIGH (ref 70–99)
Potassium: 3.5 mmol/L (ref 3.5–5.1)
SODIUM: 136 mmol/L (ref 135–145)

## 2018-05-17 LAB — TYPE AND SCREEN
ABO/RH(D): B POS
Antibody Screen: NEGATIVE
Unit division: 0
Unit division: 0

## 2018-05-17 LAB — BPAM RBC
Blood Product Expiration Date: 202004132359
Blood Product Expiration Date: 202004142359
ISSUE DATE / TIME: 202003281105
ISSUE DATE / TIME: 202003291057
Unit Type and Rh: 5100
Unit Type and Rh: 5100

## 2018-05-17 SURGERY — ESOPHAGOGASTRODUODENOSCOPY (EGD) WITH PROPOFOL
Anesthesia: Monitor Anesthesia Care

## 2018-05-17 MED ORDER — HYDROMORPHONE HCL 1 MG/ML IJ SOLN: 0.2500 mg | INTRAMUSCULAR | Status: DC | PRN

## 2018-05-17 MED ORDER — GLYCOPYRROLATE 0.2 MG/ML IJ SOLN
INTRAMUSCULAR | Status: DC | PRN
  Administered 2018-05-17: 0.2 mg via INTRAVENOUS

## 2018-05-17 MED ORDER — PROPOFOL 10 MG/ML IV BOLUS
INTRAVENOUS | Status: DC | PRN
  Administered 2018-05-17: 30 mg via INTRAVENOUS
  Administered 2018-05-17: 20 mg via INTRAVENOUS

## 2018-05-17 MED ORDER — KETAMINE HCL 50 MG/5ML IJ SOSY
PREFILLED_SYRINGE | INTRAMUSCULAR | Status: AC
  Filled 2018-05-17: qty 5

## 2018-05-17 MED ORDER — STERILE WATER FOR IRRIGATION IR SOLN
Status: DC | PRN
  Administered 2018-05-17: 1.5 mL

## 2018-05-17 MED ORDER — MEPERIDINE HCL 100 MG/ML IJ SOLN: 6.2500 mg | INTRAMUSCULAR | Status: DC | PRN

## 2018-05-17 MED ORDER — SODIUM CHLORIDE 0.9 % IV SOLN: INTRAVENOUS | Status: DC

## 2018-05-17 MED ORDER — PROMETHAZINE HCL 25 MG/ML IJ SOLN: 6.2500 mg | INTRAMUSCULAR | Status: DC | PRN

## 2018-05-17 MED ORDER — PROPOFOL 500 MG/50ML IV EMUL
INTRAVENOUS | Status: DC | PRN
  Administered 2018-05-17: 150 ug/kg/min via INTRAVENOUS

## 2018-05-17 MED ORDER — LACTATED RINGERS IV SOLN
INTRAVENOUS | Status: DC
  Administered 2018-05-17: 12:00:00 via INTRAVENOUS

## 2018-05-17 MED ORDER — HYDROCODONE-ACETAMINOPHEN 7.5-325 MG PO TABS: 1.0000 | ORAL_TABLET | Freq: Once | ORAL | Status: DC | PRN

## 2018-05-17 MED ORDER — LACTATED RINGERS IV SOLN: INTRAVENOUS | Status: DC

## 2018-05-17 MED ORDER — LIDOCAINE HCL (CARDIAC) PF 100 MG/5ML IV SOSY
PREFILLED_SYRINGE | INTRAVENOUS | Status: DC | PRN
  Administered 2018-05-17: 40 mg via INTRAVENOUS

## 2018-05-17 MED ORDER — PANTOPRAZOLE SODIUM 40 MG PO TBEC
40.0000 mg | DELAYED_RELEASE_TABLET | Freq: Every day | ORAL | Status: DC
  Administered 2018-05-17 – 2018-05-18 (×2): 40 mg via ORAL
  Filled 2018-05-17 (×2): qty 1

## 2018-05-17 MED ORDER — KETAMINE HCL 10 MG/ML IJ SOLN
INTRAMUSCULAR | Status: DC | PRN
  Administered 2018-05-17: 5 mg via INTRAVENOUS
  Administered 2018-05-17: 10 mg via INTRAVENOUS

## 2018-05-17 NOTE — Progress Notes (Signed)
OT Cancellation Note  Patient Details Name: Darren Howard MRN: [id number] DOB: 1950/05/02   Cancelled Treatment:    Reason Eval/Treat Not Completed: Fatigue/lethargy limiting ability to participate. Patient sleeping soundly following his EGD this afternoon. Will attempt treatment at a later date when able.     Ailene Ravel, OTR/L,CBIS  [phone]  05/17/2018, 3:52 PM

## 2018-05-17 NOTE — Consult Note (Signed)
Referring Provider: Dr. Manuella Ghazi  Primary Care Physician:  [name], MD Primary Gastroenterologist:  Dr. Gala Romney   Date of Admission: 05/13/2018 Date of Consultation: 05/17/2018  Reason for Consultation:  Anemia, heme positive stool.   HPI:  Kie Calvin is a 68 y.o. year old male who presented to the ED with complaints of weakness and dizziness for the past several weeks and found to be anemic with Hgb 7.7, down from 12 range one year ago. Ferritin elevated in the 1000 range, sats 86%, iron 151, which were drawn prior to any blood transfusion. Received 2 units PRBCs, with the first 3/28, after Hgb dropped to 6.6. Heme positive.   States since his wife passed 6-7 years ago, has been "out of whack". Noted decreased appetite over past 2-3 weeks. Intermittent nausea and vomiting. No hematemesis. Every now and then will have an "ache" in middle of his stomach. No dysphagia. Takes time and chews food well. Denies chronic GERD symptoms. A few weeks ago had bouts with diarrhea and constipation alternating. For past month has increased alcohol to about a pint a day. Usually would drink daily prior to this but not as much as he is now. No black stool or bright red blood per rectum. No prior colonoscopy. No EGD. Takes 1-2 tablets Ibuprofen a day for joint pain. No aspirin powders. Unknown if any family history of colon cancer or colon polyps; he does say that his brother has some type of cancer in his body.   Believes he has lost about 20-30 lbs over last few months.   Past Medical History:  Diagnosis Date  . Diabetes mellitus without complication ([place])   . Glucose intolerance (impaired glucose tolerance)   . Hyperlipemia   . Hypertension     Past Surgical History:  Procedure Laterality Date  . CARDIAC CATHETERIZATION    . HERNIA REPAIR      Prior to Admission medications   Medication Sig Start Date End Date Taking? Authorizing Provider  amLODipine (NORVASC) 10 MG tablet Take 10 mg by mouth  daily.   Yes [provider]  atorvastatin (LIPITOR) 10 MG tablet Take 10 mg by mouth at bedtime.   Yes [provider]  carvedilol (COREG) 25 MG tablet Take 25 mg by mouth 2 (two) times daily.   Yes [provider]  metFORMIN (GLUCOPHAGE-XR) 500 MG 24 hr tablet Take 1,000 mg by mouth 2 (two) times daily.   Yes [provider]    Current Facility-Administered Medications  Medication Dose Route Frequency Provider Last Rate Last Dose  . 0.9 %  sodium chloride infusion (Manually program via Guardrails IV Fluids)   Intravenous Once Allie Bossier, MD      . 0.9 %  sodium chloride infusion (Manually program via Guardrails IV Fluids)   Intravenous Once Manuella Ghazi, Pratik D, DO      . 0.9 %  sodium chloride infusion (Manually program via Guardrails IV Fluids)   Intravenous Once Manuella Ghazi, Pratik D, DO      . 0.9 %  sodium chloride infusion   Intravenous Continuous Heath Lark D, DO 75 mL/hr at 05/17/18 0537    . acetaminophen (TYLENOL) tablet 650 mg  650 mg Oral Q6H PRN Heath Lark D, DO   650 mg at 05/13/18 1957   Or  . acetaminophen (TYLENOL) suppository 650 mg  650 mg Rectal Q6H PRN Manuella Ghazi, Pratik D, DO      . atorvastatin (LIPITOR) tablet 10 mg  10 mg Oral  QHS Manuella Ghazi, Pratik D, DO   10 mg at 05/16/18 2109  . folic acid (FOLVITE) tablet 1 mg  1 mg Oral Daily Manuella Ghazi, Pratik D, DO   1 mg at 05/16/18 0948  . insulin aspart (novoLOG) injection 0-15 Units  0-15 Units Subcutaneous TID WC Shah, Pratik D, DO   3 Units at 05/16/18 1730  . insulin aspart (novoLOG) injection 0-5 Units  0-5 Units Subcutaneous QHS Shah, Pratik D, DO      . LORazepam (ATIVAN) tablet 1 mg  1 mg Oral Q6H PRN Manuella Ghazi, Pratik D, DO       Or  . LORazepam (ATIVAN) injection 1 mg  1 mg Intravenous Q6H PRN Manuella Ghazi, Pratik D, DO      . multivitamin with minerals tablet 1 tablet  1 tablet Oral Daily Manuella Ghazi, Pratik D, DO   1 tablet at 05/16/18 [phone]  . ondansetron (ZOFRAN) tablet 4 mg  4 mg Oral Q6H PRN Manuella Ghazi, Pratik D, DO        Or  . ondansetron (ZOFRAN) injection 4 mg  4 mg Intravenous Q6H PRN Manuella Ghazi, Pratik D, DO   4 mg at 05/13/18 1958  . pantoprazole (PROTONIX) injection 40 mg  40 mg Intravenous Q12H Shah, Pratik D, DO   40 mg at 05/16/18 2109  . sodium chloride 0.9 % bolus 500 mL  500 mL Intravenous Once Manuella Ghazi, Pratik D, DO      . thiamine (VITAMIN B-1) tablet 100 mg  100 mg Oral Daily Manuella Ghazi, Pratik D, DO   100 mg at 05/16/18 9030   Or  . thiamine (B-1) injection 100 mg  100 mg Intravenous Daily Manuella Ghazi, Pratik D, DO        Allergies as of 05/13/2018  . (No Known Allergies)    Family History  Problem Relation Age of Onset  . Congestive Heart Failure Mother   . Congestive Heart Failure Father   . Cancer Brother        unknown kind   . Colon cancer Neg Hx   . Colon polyps Neg Hx     Social History   Socioeconomic History  . Marital status: Widowed    Spouse name: Not on file  . Number of children: Not on file  . Years of education: Not on file  . Highest education level: Not on file  Occupational History  . Occupation: retired    Comment: [employer]  . Financial resource strain: Not on file  . Food insecurity:    Worry: Not on file    Inability: Not on file  . Transportation needs:    Medical: Not on file    Non-medical: Not on file  Tobacco Use  . Smoking status: Current Every Day Smoker    Packs/day: 0.50    Years: 30.00    Pack years: 15.00    Types: Cigarettes  . Smokeless tobacco: Never Used  Substance and Sexual Activity  . Alcohol use: Yes    Alcohol/week: 0.0 standard drinks    Comment: pint per day   . Drug use: No  . Sexual activity: Not on file  Lifestyle  . Physical activity:    Days per week: Not on file    Minutes per session: Not on file  . Stress: Not on file  Relationships  . Social connections:    Talks on phone: Not on file    Gets together: Not on file    Attends religious service: Not on file  Active member of club or organization: Not on  file    Attends meetings of clubs or organizations: Not on file    Relationship status: Not on file  . Intimate partner violence:    Fear of current or ex partner: Not on file    Emotionally abused: Not on file    Physically abused: Not on file    Forced sexual activity: Not on file  Other Topics Concern  . Not on file  Social History Narrative  . Not on file    Review of Systems: Gen: see HPI  CV: Denies chest pain, heart palpitations, syncope, edema  Resp: Denies shortness of breath with rest, cough, wheezing GI: see HPI  GU : Denies urinary burning, urinary frequency, urinary incontinence.  MS: +joint pain  Derm: Denies rash, itching, dry skin Psych: Denies depression, anxiety,confusion, or memory loss Heme: see HPI   Physical Exam: Vital signs in last 24 hours: Temp:  [98.2 F (36.8 C)-98.6 F (37 C)] 98.6 F (37 C) (03/30 0550) Pulse Rate:  [80-99] 99 (03/30 0550) Resp:  [16-19] 18 (03/30 0550) BP: (87-143)/(55-83) 143/81 (03/30 0550) SpO2:  [100 %] 100 % (03/30 0550) Weight:  [93.5 kg] 93.5 kg (03/30 0550) Last BM Date: 05/16/18 General:   Alert,  Well-developed, well-nourished, pleasant and cooperative in NAD Head:  Normocephalic and atraumatic. Eyes:  Sclera clear, no icterus.   Conjunctiva pink. Ears:  Normal auditory acuity. Nose:  No deformity, discharge,  or lesions. Mouth:  No deformity or lesions, dentition normal. Lungs:  Clear throughout to auscultation.    Heart:  S1 S2 present without murmurs  Abdomen:  Soft, nontender and nondistended. Normal bowel sounds, without guarding, and without rebound. Query hepatomegaly Rectal:  Deferred until time of colonoscopy.   Msk:  Symmetrical without gross deformities. Normal posture. Extremities:  Without  edema. Neurologic:  Alert and  oriented x4 Psych:  Normal mood and affect.  Intake/Output from previous day: 03/29 0701 - 03/30 0700 In: 1035 [P.O.:720; Blood:315] Out: 800 [Urine:800] Intake/Output this  shift: No intake/output data recorded.  Lab Results: Recent Labs    05/15/18 0617  05/16/18 0730 05/16/18 1637 05/17/18 0419  WBC 6.9  --  7.6  --  9.3  HGB 6.6*   < > 7.0* 8.0* 7.8*  HCT 20.2*   < > 21.2* 22.8* 23.0*  PLT 237  --  209  --  201   < > = values in this interval not displayed.   BMET Recent Labs    05/15/18 0617 05/16/18 0730 05/17/18 0419  NA 137 135 136  K 3.5 3.5 3.5  CL 105 106 108  CO2 20* 22 20*  GLUCOSE 164* 139* 116*  BUN 22 17 19   CREATININE 1.19 0.96 1.04  CALCIUM 7.9* 7.4* 6.9*    Studies/Results: No results found.  Impression: 68 year old male presenting with symptomatic anemia, heme positive stool, and no overt GI bleeding. Vague constellation of upper GI symptoms with reported loss of appetite, weight loss, nausea/vomiting, abdominal discomfort in setting of daily Ibuprofen and increased alcohol from baseline (up to 1 pint a day for past month). No hematochezia or melena noted. No prior colonoscopy or EGD. Received 2 units PRBCs this admission after drop in Hgb to 6.6, with Hgb 7.8 this morning.   Discussed with patient pursuing EGD first today with Propofol. He will need a colonoscopy electively in the future. Interesting iron indices with markedly elevated ferritin and iron saturations, which are multifactorial in this  setting. Will need further evaluation of this after diagnostic EGD.   Plan: Continue PPI BID EGD with Propofol today by Dr. Gala Romney. Discussed risks and benefits with patient, and he states understanding. Will need elective colonoscopy in future Alcohol cessation Avoid NSAIDs Further recommendations after EGD   Annitta Needs, PhD, ANP-BC [facility]     LOS: 4 days    05/17/2018, 8:31 AM

## 2018-05-17 NOTE — Transfer of Care (Signed)
Immediate Anesthesia Transfer of Care Note  Patient: Darren Howard  Procedure(s) Performed: ESOPHAGOGASTRODUODENOSCOPY (EGD) WITH PROPOFOL (N/A ) BIOPSY  Patient Location: PACU  Anesthesia Type:MAC  Level of Consciousness: awake, alert , oriented and patient cooperative  Airway & Oxygen Therapy: Patient Spontanous Breathing  Post-op Assessment: Report given to RN and Post -op Vital signs reviewed and stable  Post vital signs: Reviewed and stable  Last Vitals:  Vitals Value Taken Time  BP 119/80 05/17/2018 12:36 PM  Temp    Pulse 106 05/17/2018 12:38 PM  Resp 25 05/17/2018 12:38 PM  SpO2 100 % 05/17/2018 12:38 PM  Vitals shown include unvalidated device data.  Last Pain:  Vitals:   05/17/18 1215  TempSrc:   PainSc: 0-No pain         Complications: No apparent anesthesia complications

## 2018-05-17 NOTE — Op Note (Signed)
[facility] Patient Name: Darren Howard Procedure Date: 05/17/2018 11:49 AM MRN: [id number] Date of Birth: December 30, 1950 Attending MD: Norvel Richards , MD CSN: [id number] Age: 68 Admit Type: Inpatient Procedure:                Upper GI endoscopy Indications:              Dyspepsia, Heme positive stool, NSAID use Providers:                Norvel Richards, MD, Charlsie Quest. Theda Sers RN, RN,                            Raphael Gibney, Aram Candela Referring MD:              Medicines:                Propofol per Anesthesia Complications:            No immediate complications. Estimated Blood Loss:     Estimated blood loss was minimal. Procedure:                Pre-Anesthesia Assessment:                           - Prior to the procedure, a History and Physical                            was performed, and patient medications and                            allergies were reviewed. The patient's tolerance of                            previous anesthesia was also reviewed. The risks                            and benefits of the procedure and the sedation                            options and risks were discussed with the patient.                            All questions were answered, and informed consent                            was obtained. Prior Anticoagulants: The patient has                            taken no previous anticoagulant or antiplatelet                            agents. ASA Grade Assessment: III - A patient with                            severe systemic disease. After reviewing the risks  and benefits, the patient was deemed in                            satisfactory condition to undergo the procedure.                           After obtaining informed consent, the endoscope was                            passed under direct vision. Throughout the                            procedure, the patient's blood pressure, pulse, and                      oxygen saturations were monitored continuously. The                            [id number]-[id number] ([id number]) was introduced through the and                            advanced to the second part of duodenum. The upper                            GI endoscopy was accomplished without difficulty.                            The patient tolerated the procedure well. Scope In: 12:18:48 PM Scope Out: 12:25:02 PM Total Procedure Duration: 0 hours 6 minutes 14 seconds  Findings:      The examined esophagus was normal.      Multiple erosions were found in the gastric antrum. This was biopsied       with a cold forceps for histology. Estimated blood loss was minimal.      Multiple bulbar and proximal second portion erosions found as well. No       ulcer or infiltrating process observed anywhere. No stigmata of chronic       liver disease observed either. Impression:               - Normal esophagus.                           - Erosive gastropathy. Biopsied.                           - Duodenal erosions. I suspect NSAID gastropathy. Moderate Sedation:      Moderate (conscious) sedation was personally administered by an       anesthesia professional. The following parameters were monitored: oxygen       saturation, heart rate, blood pressure, respiratory rate, EKG, adequacy       of pulmonary ventilation, and response to care. Recommendation:           - Return patient to hospital ward for ongoing care.                            Advance diet.                           -  Continue present medications. Protonix 40 mg                            orally once daily should suffice for acid                            suppression. Follow-up on pathology. Outpatient                            colonoscopy to further evaluate Hemoccult positive                            stool and anemia.                           - No repeat upper endoscopy. Procedure Code(s):        --- Professional ---                            [phone], Esophagogastroduodenoscopy, flexible,                            transoral; with biopsy, single or multiple Diagnosis Code(s):        --- Professional ---                           K31.89, Other diseases of stomach and duodenum                           K26.9, Duodenal ulcer, unspecified as acute or                            chronic, without hemorrhage or perforation                           R10.13, Epigastric pain                           R19.5, Other fecal abnormalities CPT copyright 2019 American Medical Association. All rights reserved. The codes documented in this report are preliminary and upon coder review may  be revised to meet current compliance requirements. Cristopher Estimable. Krishav Mamone, MD Norvel Richards, MD 05/17/2018 12:39:44 PM This report has been signed electronically. Number of Addenda: 0

## 2018-05-17 NOTE — Progress Notes (Signed)
PROGRESS NOTE    Darren Howard  FWY:[id number] DOB: [birth year] DOA: 05/13/2018 PCP: [name], MD   Brief Narrative:  Per HPI: Darren Howard a 68 y.o.malewith medical history significant fortype 2 diabetes with medication noncompliance, dyslipidemia, hypertension, and alcohol abuse who presented to the ED with complaints of weakness and dizziness that has been ongoing for the last 2 to 3 weeks. As a result, he cannot ambulate very well and typically crawls around. He states that he feels that the room is spinning and has had some mild nausea, but no vomiting. He denies any fevers or chills, chest pain, or shortness of breath. He denies any presyncopal symptoms and is otherwise a poor historian. No trouble with speech or swallowing noted. He called EMS for evaluation in the ED per his son's recommendation today. Patient apparently has not been taking his home medications as prescribed either. He denies any blood in his stools or black tarry stools.  Patient is noted to have acute blood loss anemia and has undergone transfusion with 1 unit on 3/28 and will require repeat transfusion on 3/29.  Hemoccult positive.  GI consulted for further evaluation via endoscopy today.  Patient has been evaluated by PT and noted to have symptoms that may be related more to orthostasis. He is recommended for SNF at this point in time.   Assessment & Plan:   Principal Problem:   Dizziness Active Problems:   Hypertension   Anemia   DM2 (diabetes mellitus, type 2) ([place])   1. Dizziness-likely secondary to orthostasis. Brain MRI with no sign of CVA noted.Appreciate PT evaluation with recommendations for SNF on discharge. Maintain on fall precautions. 2. Acute blood loss anemia. FOBT noted to be positive.    Status post 2 units of PRBCs.  Maintain on Protonix IV twice daily until GI evaluation for endoscopy. 3. Hyperglycemia with associated hyponatremia-improved. Placed on aggressive  sliding scale insulin dosing as well as carb modified diet andhemoglobin A1c noted to be 6.9%. 4. Hypertension. Patient appears to have stable blood pressure control noted at this time,but this could be related to dehydration/hypovolemia. Will maintain on IV fluid for now until on stable diet after procedures.  DVT prophylaxis:SCDs Code Status:Full Family Communication:Spoke with son on phone on 3/28 Disposition Plan:  Per GI evaluation.  Patient has been n.p.o. after midnight in case upper endoscopy may be performed today.   Consultants:  GI  Procedures:  None  Antimicrobials:   None  Subjective: Patient seen and evaluated today with no new acute complaints or concerns. No acute concerns or events noted overnight.  No overt bleeding noted over the last 24 hours.  Objective: Vitals:   05/16/18 1120 05/16/18 1326 05/16/18 2128 05/17/18 0550  BP: 105/64 136/83 103/62 (!) 143/81  Pulse: 85 93 93 99  Resp: 16 16 19 18   Temp: 98.3 F (36.8 C) 98.4 F (36.9 C) 98.5 F (36.9 C) 98.6 F (37 C)  TempSrc: Oral Oral Oral Oral  SpO2: 100% 100% 100% 100%  Weight:    93.5 kg  Height:        Intake/Output Summary (Last 24 hours) at 05/17/2018 0804 Last data filed at 05/16/2018 1700 Gross per 24 hour  Intake 1035 ml  Output 800 ml  Net 235 ml   Filed Weights   05/13/18 1617 05/17/18 0550  Weight: 83.7 kg 93.5 kg    Examination:  General exam: Appears calm and comfortable  Respiratory system: Clear to auscultation. Respiratory effort normal. Cardiovascular system: S1 &  S2 heard, RRR. No JVD, murmurs, rubs, gallops or clicks. No pedal edema. Gastrointestinal system: Abdomen is nondistended, soft and nontender. No organomegaly or masses felt. Normal bowel sounds heard. Central nervous system: Alert and oriented. No focal neurological deficits. Extremities: Symmetric 5 x 5 power. Skin: No rashes, lesions or ulcers Psychiatry: Judgement and insight appear  normal. Mood & affect appropriate.     Data Reviewed: I have personally reviewed following labs and imaging studies  CBC: Recent Labs  Lab 05/13/18 1338 05/14/18 0511 05/15/18 0617 05/15/18 1531 05/16/18 0730 05/16/18 1637 05/17/18 0419  WBC 8.7 7.4 6.9  --  7.6  --  9.3  NEUTROABS 7.2  --   --   --   --   --   --   HGB 7.7* 7.2* 6.6* 7.6* 7.0* 8.0* 7.8*  HCT 22.7* 21.2* 20.2* 22.5* 21.2* 22.8* 23.0*  MCV 95.0 96.4 99.0  --  97.2  --  93.9  PLT 305 260 237  --  209  --  277   Basic Metabolic Panel: Recent Labs  Lab 05/13/18 1338 05/14/18 0511 05/15/18 0617 05/16/18 0730 05/17/18 0419  NA 130* 135 137 135 136  K 3.7 3.3* 3.5 3.5 3.5  CL 93* 99 105 106 108  CO2 21* 25 20* 22 20*  GLUCOSE 373* 172* 164* 139* 116*  BUN 30* 27* 22 17 19   CREATININE 1.58* 1.22 1.19 0.96 1.04  CALCIUM 8.4* 8.2* 7.9* 7.4* 6.9*   GFR: Estimated Creatinine Clearance: 84.1 mL/min (by C-G formula based on SCr of 1.04 mg/dL). Liver Function Tests: Recent Labs  Lab 05/13/18 1338  AST 57*  ALT 24  ALKPHOS 112  BILITOT 1.0  PROT 6.9  ALBUMIN 3.5   No results for input(s): LIPASE, AMYLASE in the last 168 hours. No results for input(s): AMMONIA in the last 168 hours. Coagulation Profile: No results for input(s): INR, PROTIME in the last 168 hours. Cardiac Enzymes: Recent Labs  Lab 05/13/18 1338  TROPONINI <0.03   BNP (last 3 results) No results for input(s): PROBNP in the last 8760 hours. HbA1C: No results for input(s): HGBA1C in the last 72 hours. CBG: Recent Labs  Lab 05/16/18 0748 05/16/18 1118 05/16/18 1606 05/16/18 2133 05/17/18 0724  GLUCAP 110* 226* 186* 157* 121*   Lipid Profile: No results for input(s): CHOL, HDL, LDLCALC, TRIG, CHOLHDL, LDLDIRECT in the last 72 hours. Thyroid Function Tests: No results for input(s): TSH, T4TOTAL, FREET4, T3FREE, THYROIDAB in the last 72 hours. Anemia Panel: No results for input(s): VITAMINB12, FOLATE, FERRITIN, TIBC, IRON,  RETICCTPCT in the last 72 hours. Sepsis Labs: No results for input(s): PROCALCITON, LATICACIDVEN in the last 168 hours.  No results found for this or any previous visit (from the past 240 hour(s)).       Radiology Studies: No results found.      Scheduled Meds: . sodium chloride   Intravenous Once  . sodium chloride   Intravenous Once  . sodium chloride   Intravenous Once  . atorvastatin  10 mg Oral QHS  . folic acid  1 mg Oral Daily  . insulin aspart  0-15 Units Subcutaneous TID WC  . insulin aspart  0-5 Units Subcutaneous QHS  . multivitamin with minerals  1 tablet Oral Daily  . pantoprazole (PROTONIX) IV  40 mg Intravenous Q12H  . thiamine  100 mg Oral Daily   Or  . thiamine  100 mg Intravenous Daily   Continuous Infusions: . sodium chloride 75 mL/hr at 05/17/18  1660  . sodium chloride       LOS: 4 days    Time spent: 30 minutes    Chani Ghanem Darleen Crocker, DO [facility] Pager [phone]  If 7PM-7AM, please contact night-coverage [email or web address] Password [email or web address] 05/17/2018, 8:04 AM

## 2018-05-17 NOTE — Anesthesia Postprocedure Evaluation (Signed)
Anesthesia Post Note  Patient: Darren Howard  Procedure(s) Performed: ESOPHAGOGASTRODUODENOSCOPY (EGD) WITH PROPOFOL (N/A ) BIOPSY  Patient location during evaluation: PACU Anesthesia Type: MAC Level of consciousness: awake and alert and oriented Pain management: pain level controlled Vital Signs Assessment: post-procedure vital signs reviewed and stable Respiratory status: spontaneous breathing Cardiovascular status: stable Postop Assessment: no apparent nausea or vomiting Anesthetic complications: no     Last Vitals:  Vitals:   05/17/18 1108 05/17/18 1112  BP:  138/87  Pulse: 95   Resp: (!) 22   Temp: 36.8 C   SpO2: 100%     Last Pain:  Vitals:   05/17/18 1215  TempSrc:   PainSc: 0-No pain                 [name]

## 2018-05-17 NOTE — Progress Notes (Signed)
PT treatment cancellation note:   Pt at test or procedure/unavailable (pt currently at EGD. Will check back at a later time/day as able).    Geraldine Solar PT, DPT

## 2018-05-17 NOTE — Progress Notes (Signed)
Pt alert, awake, continued diarrhea, moderate amount of brown loose stool noted. Pt denies abdominal pain, dizziness, palpitations, nausea or sob. Pt is NPO. Will continue to monitor. To Endo via stretcher

## 2018-05-17 NOTE — Anesthesia Preprocedure Evaluation (Signed)
Anesthesia Evaluation    Airway Mallampati: II       Dental  (+) Poor Dentition   Pulmonary Current Smoker,     + decreased breath sounds      Cardiovascular hypertension, On Medications  Rhythm:irregular     Neuro/Psych    GI/Hepatic   Endo/Other  diabetes, Type 2  Renal/GU      Musculoskeletal   Abdominal   Peds  Hematology  (+) Blood dyscrasia, anemia ,   Anesthesia Other Findings hgb 7.8 today Transfused 2 units H/O heavy ETOH abuse up until time of admission three days ago.   Reproductive/Obstetrics                             Anesthesia Physical Anesthesia Plan  ASA: III  Anesthesia Plan: MAC   Post-op Pain Management:    Induction:   PONV Risk Score and Plan:   Airway Management Planned:   Additional Equipment:   Intra-op Plan:   Post-operative Plan:   Informed Consent: I have reviewed the patients History and Physical, chart, labs and discussed the procedure including the risks, benefits and alternatives for the proposed anesthesia with the patient or authorized representative who has indicated his/her understanding and acceptance.     Dental Advisory Given  Plan Discussed with: Anesthesiologist  Anesthesia Plan Comments:         Anesthesia Quick Evaluation

## 2018-05-18 ENCOUNTER — Telehealth: Payer: Self-pay | Admitting: Gastroenterology

## 2018-05-18 ENCOUNTER — Encounter: Payer: Self-pay | Admitting: Internal Medicine

## 2018-05-18 LAB — BASIC METABOLIC PANEL
Anion gap: 7 (ref 5–15)
BUN: 18 mg/dL (ref 8–23)
CO2: 21 mmol/L — ABNORMAL LOW (ref 22–32)
CREATININE: 1.05 mg/dL (ref 0.61–1.24)
Calcium: 6.5 mg/dL — ABNORMAL LOW (ref 8.9–10.3)
Chloride: 108 mmol/L (ref 98–111)
GFR calc non Af Amer: >60 mL/min (ref >60)
Glucose, Bld: 108 mg/dL — ABNORMAL HIGH (ref 70–99)
Potassium: 3.5 mmol/L (ref 3.5–5.1)
Sodium: 136 mmol/L (ref 135–145)

## 2018-05-18 LAB — CBC
HCT: 23 % — ABNORMAL LOW (ref 39.0–52.0)
HEMOGLOBIN: 7.8 g/dL — AB (ref 13.0–17.0)
MCH: 31.8 pg (ref 26.0–34.0)
MCHC: 33.9 g/dL (ref 30.0–36.0)
MCV: 93.9 fL (ref 80.0–100.0)
Platelets: 202 10*3/uL (ref 150–400)
RBC: 2.45 MIL/uL — ABNORMAL LOW (ref 4.22–5.81)
RDW: 15.9 % — ABNORMAL HIGH (ref 11.5–15.5)
WBC: 8.5 10*3/uL (ref 4.0–10.5)
nRBC: 0 % (ref 0.0–0.2)

## 2018-05-18 LAB — GLUCOSE, CAPILLARY
GLUCOSE-CAPILLARY: 205 mg/dL — AB (ref 70–99)
Glucose-Capillary: 102 mg/dL — ABNORMAL HIGH (ref 70–99)

## 2018-05-18 MED ORDER — FOLIC ACID 1 MG PO TABS: 1.0000 mg | ORAL_TABLET | Freq: Every day | ORAL | 3 refills | Status: AC

## 2018-05-18 MED ORDER — THIAMINE HCL 100 MG PO TABS: 100.0000 mg | ORAL_TABLET | Freq: Every day | ORAL | 0 refills | Status: AC

## 2018-05-18 MED ORDER — PANTOPRAZOLE SODIUM 40 MG PO TBEC: 40.0000 mg | DELAYED_RELEASE_TABLET | Freq: Every day | ORAL | 3 refills | Status: DC

## 2018-05-18 NOTE — Progress Notes (Signed)
Physical Therapy Treatment Patient Details Name: Darren Howard MRN: [id number] DOB: 01/08/1951 Today's Date: 05/18/2018    History of Present Illness Darren Howard is a 68 y.o. male with medical history significant for type 2 diabetes with medication noncompliance, dyslipidemia, hypertension, and alcohol abuse who presented to the ED with complaints of weakness and dizziness that has been ongoing for the last 2 to 3 weeks.  As a result, he cannot ambulate very well and typically crawls around.  He states that he feels that the room is spinning and has had some mild nausea, but no vomiting.  He denies any fevers or chills, chest pain, or shortness of breath.  He denies any presyncopal symptoms and is otherwise a poor historian.  No trouble with speech or swallowing noted.  He called EMS for evaluation in the ED per his son's recommendation today.  Patient apparently has not been taking his home medications as prescribed either.  He denies any blood in his stools or black tarry stools.    PT Comments    Pt sitting in chair upon PTA entrance, friendly and willing to participate with therapy today. Vitals assessed all WFL prior mobility, gait and therex.  Min guard with gait training, able to increased distance and overall cadence with gait.  Minimal LOB required min A with turns and mild c/o dizziness initially standing.  Upon return to room pt sat in chair and completed seated LE strengthening exercises.  Following therex pt requested to return to bed, min guard with SPT with 1 HHA back to bed.  Pt left in bed with call bell within reach, bed alarm set and RN aware of status.     Follow Up Recommendations  SNF;Supervision/Assistance - 24 hour     Equipment Recommendations  Rolling walker with 5" wheels    Recommendations for Other Services       Precautions / Restrictions Precautions Precautions: Fall    Mobility  Bed Mobility           Sit to supine: Supervision   General bed  mobility comments: sitting in chair upon PTA entrance; following gait pt left in chair for therex then requested to return to bed due to fatigue  Transfers Overall transfer level: Needs assistance   Transfers: Sit to/from Stand Sit to Stand: Min assist         General transfer comment: cueing for hand placement  Ambulation/Gait Ambulation/Gait assistance: Min assist;Min guard Gait Distance (Feet): 130 Feet Assistive device: Rolling walker (2 wheeled) Gait Pattern/deviations: Decreased step length - right;Decreased step length - left;Decreased stride length Gait velocity: decreased   General Gait Details: increased endurance/cadence and distance for ambulation.  Contines to ambulate at slow gait velocity.  Minimal LOB upon turns mild c/o dizziness initially.   Stairs             Wheelchair Mobility    Modified Rankin (Stroke Patients Only)       Balance                                            Cognition Arousal/Alertness: Awake/alert Behavior During Therapy: WFL for tasks assessed/performed Overall Cognitive Status: Within Functional Limits for tasks assessed  Exercises General Exercises - Upper Extremity Shoulder Flexion: Theraband;10 reps;Both Theraband Level (Shoulder Flexion): Level 2 (Red) Shoulder Horizontal ABduction: Theraband;10 reps;Both Theraband Level (Shoulder Horizontal Abduction): Level 2 (Red) Shoulder Horizontal ADduction: Theraband;10 reps;Both Theraband Level (Shoulder Horizontal Adduction): Level 2 (Red) Elbow Flexion: Theraband;10 reps;Both Theraband Level (Elbow Flexion): Level 2 (Red) Elbow Extension: Theraband;10 reps;Both General Exercises - Lower Extremity Ankle Circles/Pumps: 10 reps;Seated;Both Long Arc Quad: Both;10 reps Toe Raises: Seated;Strengthening;AROM;Both;10 reps Heel Raises: Seated;AROM;Strengthening;Both;10 reps Other Exercises Other  Exercises: shoulder protraction, red theraband, 10X    General Comments        Pertinent Vitals/Pain Pain Assessment: No/denies pain    Home Living                      Prior Function            PT Goals (current goals can now be found in the care plan section) Acute Rehab PT Goals Patient Stated Goal: return home Progress towards PT goals: Progressing toward goals    Frequency    Min 3X/week      PT Plan Current plan remains appropriate    Co-evaluation              AM-PAC PT "6 Clicks" Mobility   Outcome Measure  Help needed turning from your back to your side while in a flat bed without using bedrails?: None Help needed moving from lying on your back to sitting on the side of a flat bed without using bedrails?: None Help needed moving to and from a bed to a chair (including a wheelchair)?: A Little Help needed standing up from a chair using your arms (e.g., wheelchair or bedside chair)?: A Little Help needed to walk in hospital room?: A Little Help needed climbing 3-5 steps with a railing? : A Lot 6 Click Score: 19    End of Session Equipment Utilized During Treatment: Gait belt Activity Tolerance: Patient tolerated treatment well;Patient limited by fatigue Patient left: in bed;with call bell/phone within reach;with bed alarm set Nurse Communication: Mobility status(RN aware of status) PT Visit Diagnosis: Unsteadiness on feet (R26.81);Other abnormalities of gait and mobility (R26.89);Muscle weakness (generalized) (M62.81)     Time: 1610-9604 PT Time Calculation (min) (ACUTE ONLY): 20 min  Charges:  $Gait Training: 8-22 mins $Therapeutic Exercise: 8-22 mins                     [address] [place]; [place]  Aldona Lento 05/18/2018, 10:23 AM

## 2018-05-18 NOTE — TOC Transition Note (Signed)
Transition of Care [facility]) - CM/SW Discharge Note   Patient Details  Name: Darren Howard MRN: [id number] Date of Birth: 11-10-1950  Transition of Care [facility]) CM/SW Contact:  Darren Barge, RN Phone Number: 05/18/2018, 12:49 PM   Clinical Narrative:   Day of DC pt wishes to continue with plan for SNF. [facility] ready for DC today. Plan discussed with son via phone. He will provide transport after he gets off work. Son aware he will not be able to visit pt at SNF d/t pandemic. Cont review provided to [employer]. auth good through 05/20/2018.     Final next level of care: [place]     Patient Goals and CMS Choice Patient states their goals for this hospitalization and ongoing recovery are:: control my Blood pressure CMS Medicare.gov Compare Post Acute Care list provided to:: Patient Choice offered to / list presented to : Patient  Discharge Placement PASRR number recieved: 05/14/18            Patient chooses bed at: [facility] Patient to be transferred to facility by: Son Darren Howard) Name of family member notified: Son Darren Howard) Patient and family notified of of transfer: 05/18/18  Discharge Plan and Services     Post Acute Care Choice: [place]               Social Determinants of Health (SDOH) Interventions Alcohol Brief Interventions/Follow-up: Alcohol Education   Readmission Risk Interventions Readmission Risk Prevention Plan 05/18/2018 05/14/2018  Post Dischage Appt Complete -  Medication Screening Complete -  Transportation Screening Complete Complete  PCP or Specialist Appt within 5-7 Days - Complete  Home Care Screening - Complete  Medication Review (RN CM) - Complete  Some recent data might be hidden

## 2018-05-18 NOTE — Progress Notes (Signed)
    Subjective: No overt GI bleeding. Tolerating diet. No abdominal pain, N/V.   Objective: Vital signs in last 24 hours: Temp:  [98.2 F (36.8 C)-100 F (37.8 C)] 100 F (37.8 C) (03/31 0544) Pulse Rate:  [92-109] 105 (03/31 0544) Resp:  [15-22] 20 (03/31 0544) BP: (119-143)/(73-87) 143/87 (03/31 0544) SpO2:  [98 %-100 %] 100 % (03/31 0544) Last BM Date: 05/17/18 General:   Alert and oriented, pleasant Head:  Normocephalic and atraumatic. Abdomen:  Bowel sounds present, soft, non-tender, non-distended.  Msk:  Symmetrical without gross deformities. Normal posture. Extremities:  Without  edema. Neurologic:  Alert and  oriented x4  Intake/Output from previous day: 03/30 0701 - 03/31 0700 In: 840 [P.O.:240; I.V.:600] Out: 951 [Urine:950; Stool:1] Intake/Output this shift: No intake/output data recorded.  Lab Results: Recent Labs    05/16/18 0730 05/16/18 1637 05/17/18 0419 05/18/18 0432  WBC 7.6  --  9.3 8.5  HGB 7.0* 8.0* 7.8* 7.8*  HCT 21.2* 22.8* 23.0* 23.0*  PLT 209  --  201 202   BMET Recent Labs    05/16/18 0730 05/17/18 0419 05/18/18 0432  NA 135 136 136  K 3.5 3.5 3.5  CL 106 108 108  CO2 22 20* 21*  GLUCOSE 139* 116* 108*  BUN 17 19 18   CREATININE 0.96 1.04 1.05  CALCIUM 7.4* 6.9* 6.5*     Assessment: 67 year old male with heme positive stool, symptomatic anemia, no evidence for overt GI bleeding, s/p EGD yesterday with normal esophagus, erosive gastropathy s/p biopsy, and duodenal erosions. Likely findings secondary to NSAID effect. Hgb remains stable, with total of 2 units PRBCs this admission. Will need outpatient colonoscopy.   Also noted elevated ferritin and iron saturations. Needs outpatient follow-up for this as well.   Plan: PPI daily Avoidance of NSAIDs ETOH cessation Will arrange outpatient colonoscopy for heme positive stool Anticipate discharge to SNF at discharge   Annitta Needs, PhD, ANP-BC [facility]    LOS: 5 days    05/18/2018, 9:24 AM

## 2018-05-18 NOTE — Progress Notes (Signed)
Occupational Therapy Treatment Patient Details Name: Darren Howard MRN: [id number] DOB: 06/01/1950 Today's Date: 05/18/2018    History of present illness Darren Howard is a 68 y.o. male with medical history significant for type 2 diabetes with medication noncompliance, dyslipidemia, hypertension, and alcohol abuse who presented to the ED with complaints of weakness and dizziness that has been ongoing for the last 2 to 3 weeks.  As a result, he cannot ambulate very well and typically crawls around.  He states that he feels that the room is spinning and has had some mild nausea, but no vomiting.  He denies any fevers or chills, chest pain, or shortness of breath.  He denies any presyncopal symptoms and is otherwise a poor historian.  No trouble with speech or swallowing noted.  He called EMS for evaluation in the ED per his son's recommendation today.  Patient apparently has not been taking his home medications as prescribed either.  He denies any blood in his stools or black tarry stools.   OT comments  Pt received supine in bed, reporting dizziness this am stating it comes and goes and he told MD just prior to therapy arrival. Session focusing on BUE strengthening using red theraband. Pt requiring cuing for form and correct completion, occasional rest breaks for fatigue. OT offered to assist pt to chair for breakfast, pt declined and requested to remain in the bed due to dizziness. Once finished OT assisting in setting up breakfast tray, once pt situated he was able to open all containers/packages and prepare meal to eat without difficulty. Discharge to SNF remains appropriate.    Follow Up Recommendations  SNF    Equipment Recommendations  3 in 1 bedside commode;Tub/shower seat       Precautions / Restrictions Precautions Precautions: Fall       Mobility Bed Mobility               General bed mobility comments: not completed due to pt dizziness  Transfers                  General transfer comment: not completed due to pt dizziness        ADL either performed or assessed with clinical judgement   ADL Overall ADL's : Needs assistance/impaired Eating/Feeding: Modified independent;Bed level   Grooming: Set up;Bed level                                                 Cognition Arousal/Alertness: Awake/alert Behavior During Therapy: WFL for tasks assessed/performed Overall Cognitive Status: Within Functional Limits for tasks assessed                                          Exercises Exercises: General Upper Extremity;Other exercises General Exercises - Upper Extremity Shoulder Flexion: Theraband;10 reps;Both Theraband Level (Shoulder Flexion): Level 2 (Red) Shoulder Horizontal ABduction: Theraband;10 reps;Both Theraband Level (Shoulder Horizontal Abduction): Level 2 (Red) Shoulder Horizontal ADduction: Theraband;10 reps;Both Theraband Level (Shoulder Horizontal Adduction): Level 2 (Red) Elbow Flexion: Theraband;10 reps;Both Theraband Level (Elbow Flexion): Level 2 (Red) Elbow Extension: Theraband;10 reps;Both Other Exercises Other Exercises: shoulder protraction, red theraband, 10X           Pertinent Vitals/ Pain       Pain Assessment:  No/denies pain         Frequency  Min 2X/week        Progress Toward Goals  OT Goals(current goals can now be found in the care plan section)  Progress towards OT goals: Progressing toward goals  Acute Rehab OT Goals Patient Stated Goal: return home OT Goal Formulation: With patient Time For Goal Achievement: 05/28/18 Potential to Achieve Goals: Good ADL Goals Pt Will Perform Grooming: with modified independence;sitting Pt Will Perform Upper Body Bathing: with set-up;sitting Pt Will Perform Upper Body Dressing: with set-up;sitting Pt Will Transfer to Toilet: with supervision;stand pivot transfer;bedside commode;regular height toilet;grab  bars;ambulating Pt Will Perform Toileting - Clothing Manipulation and hygiene: with supervision;sitting/lateral leans;sit to/from stand Pt/caregiver will Perform Home Exercise Program: Increased strength;Both right and left upper extremity;With Supervision;With written HEP provided  Plan Discharge plan remains appropriate          End of Session    OT Visit Diagnosis: Muscle weakness (generalized) (M62.81)   Activity Tolerance Patient tolerated treatment well   Patient Left in bed;with call bell/phone within reach;with bed alarm set           Time: 0867-6195 OT Time Calculation (min): 18 min  Charges: OT General Charges $OT Visit: 1 Visit OT Treatments $Therapeutic Exercise: 8-22 mins    Guadelupe Sabin, OTR/L  [phone] 05/18/2018, 8:12 AM

## 2018-05-18 NOTE — Discharge Summary (Signed)
Physician Discharge Summary  [name] JQB:[id number] DOB: 13-Oct-1950 DOA: 05/13/2018  PCP: [name], MD  Admit date: 05/13/2018  Discharge date: 05/18/2018  Admitted From:Home  Disposition:  SNF  Recommendations for Outpatient Follow-up:  1. Follow up with PCP in 1-2 weeks 2. Continue on Protonix as ordered daily 3. Avoid NSAIDs 4. Follow-up with GI as scheduled for outpatient colonoscopy 5. Repeat CBC in 1 week to ensure stability  Home Health: None  Equipment/Devices: None  Discharge Condition: Stable  CODE STATUS: Full  Diet recommendation: Heart Healthy/carb modified  Brief/Interim Summary: Per HPI: Darren Howard a 68 y.o.malewith medical history significant fortype 2 diabetes with medication noncompliance, dyslipidemia, hypertension, and alcohol abuse who presented to the ED with complaints of weakness and dizziness that has been ongoing for the last 2 to 3 weeks. As a result, he cannot ambulate very well and typically crawls around. He states that he feels that the room is spinning and has had some mild nausea, but no vomiting. He denies any fevers or chills, chest pain, or shortness of breath. He denies any presyncopal symptoms and is otherwise a poor historian. No trouble with speech or swallowing noted. He called EMS for evaluation in the ED per his son's recommendation today. Patient apparently has not been taking his home medications as prescribed either. He denies any blood in his stools or black tarry stools.  Patient was noted to have acute blood loss anemia during the course of this admission and had positive stools.  Anemia panel with elevated iron levels noted in low folic acid for which supplementation has been ordered.  He will require further follow-up regarding his iron status in the future.  He did receive 1 unit of PRBCs on 3/28 with repeat transfusion on 3/29 and maintains stable hemoglobin levels at 7.8 on discharge.  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  He is recommended to follow-up outpatient for colonoscopy in the near future.  No active bleeding otherwise noted and patient recommended to remain on Protonix daily, and of course, avoid NSAIDs.  He stable for discharge to SNF at this time with no other acute events noted during the course of this admission.  Discharge Diagnoses:  Principal Problem:   Dizziness Active Problems:   Hypertension   Anemia   DM2 (diabetes mellitus, type 2) ([place])  Principal discharge diagnosis: Acute blood loss anemia secondary to suspected GI bleed.  Discharge Instructions  Discharge Instructions    Diet - low sodium heart healthy   Complete by:  As directed    Increase activity slowly   Complete by:  As directed      Allergies as of 05/18/2018   No Known Allergies     Medication List    TAKE these medications   amLODipine 10 MG tablet Commonly known as:  NORVASC Take 10 mg by mouth daily.   atorvastatin 10 MG tablet Commonly known as:  LIPITOR Take 10 mg by mouth at bedtime.   carvedilol 25 MG tablet Commonly known as:  COREG Take 25 mg by mouth 2 (two) times daily.   folic acid 1 MG tablet Commonly known as:  FOLVITE Take 1 tablet (1 mg total) by mouth daily for 30 days. Start taking on:  May 19, 2018   metFORMIN 500 MG 24 hr tablet Commonly known as:  GLUCOPHAGE-XR Take 1,000 mg by mouth 2 (two) times daily.   pantoprazole 40 MG  tablet Commonly known as:  PROTONIX Take 1 tablet (40 mg total) by mouth daily for 30 days. Start taking on:  May 19, 2018   thiamine 100 MG tablet Take 1 tablet (100 mg total) by mouth daily for 30 days. Start taking on:  May 19, 2018       Contact information for follow-up providers    [name], MD Follow up on 05/18/2018.   Specialty:  Family Medicine Why:  @ 1130AM Contact information: [address] [place]  [place] [address] [phone]            Contact information for after-discharge care    [facility] .   Service:  Skilled Nursing Contact information: [address] [place] [phone]                 No Known Allergies  Consultations:  GI   Procedures/Studies: Dg Chest 2 View  Result Date: 05/13/2018 CLINICAL DATA:  Cough EXAM: CHEST - 2 VIEW COMPARISON:  07/17/2006 FINDINGS: The heart size and mediastinal contours are within normal limits. Both lungs are clear. The visualized skeletal structures are unremarkable. IMPRESSION: No active cardiopulmonary disease. Electronically Signed   By: Franchot Gallo M.D.   On: 05/13/2018 14:22   Ct Head Wo Contrast  Result Date: 05/13/2018 CLINICAL DATA:  Weakness and dizziness for several weeks EXAM: CT HEAD WITHOUT CONTRAST TECHNIQUE: Contiguous axial images were obtained from the base of the skull through the vertex without intravenous contrast. COMPARISON:  None. FINDINGS: Brain: Mild atrophic changes and chronic white matter ischemic change is identified. No findings to suggest acute hemorrhage, acute infarction or space-occupying mass lesion seen. Few scattered lacunar infarcts are noted in the basal ganglia and thalami bilaterally. Vascular: No hyperdense vessel or unexpected calcification. Skull: Normal. Negative for fracture or focal lesion. Sinuses/Orbits: No acute finding. Other: None. IMPRESSION: Chronic atrophic and ischemic changes without acute abnormality. Electronically Signed   By: Inez Catalina M.D.   On: 05/13/2018 14:18   Mr Brain Wo Contrast  Result Date: 05/13/2018 CLINICAL DATA:  Three-week history of dizziness and gait disturbance. EXAM: MRI HEAD WITHOUT CONTRAST TECHNIQUE: Multiplanar, multiecho pulse sequences of the brain and surrounding structures were obtained without intravenous contrast. COMPARISON:  Head CT 05/13/2018 FINDINGS: Brain: Diffusion imaging  does not show any acute or subacute infarction. The brainstem and cerebellum are normal. There is an old lacunar infarction in the right thalamus and in both basal ganglia and radiating white matter tracts. No large vessel territory infarction. No mass lesion, hemorrhage, hydrocephalus or extra-axial collection. Vascular: Major vessels at the base of the brain show flow. Skull and upper cervical spine: Negative Sinuses/Orbits: Clear/normal Other: None IMPRESSION: Motion degraded exam. No acute finding. 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 Electronically Signed   By: Nelson Chimes M.D.   On: 05/13/2018 15:20     Discharge Exam: Vitals:   05/17/18 2113 05/18/18 0544  BP: 131/80 (!) 143/87  Pulse: 92 (!) 105  Resp: 20 20  Temp: 99.4 F (37.4 C) 100 F (37.8 C)  SpO2: 100% 100%   Vitals:   05/17/18 1300 05/17/18 1927 05/17/18 2113 05/18/18 0544  BP: (!) 143/73  131/80 (!) 143/87  Pulse: 94  92 (!) 105  Resp: 18  20 20   Temp: 98.2 F (36.8 C)  99.4 F (37.4 C) 100 F (37.8 C)  TempSrc:   Oral Oral  SpO2:  100% 98% 100% 100%  Weight:      Height:        General: Pt is alert, awake, not in acute distress Cardiovascular: RRR, S1/S2 +, no rubs, no gallops Respiratory: CTA bilaterally, no wheezing, no rhonchi Abdominal: Soft, NT, ND, bowel sounds + Extremities: no edema, no cyanosis    The results of significant diagnostics from this hospitalization (including imaging, microbiology, ancillary and laboratory) are listed below for reference.     Microbiology: No results found for this or any previous visit (from the past 240 hour(s)).   Labs: BNP (last 3 results) No results for input(s): BNP in the last 8760 hours. Basic Metabolic Panel: Recent Labs  Lab 05/14/18 0511 05/15/18 0617 05/16/18 0730 05/17/18 0419 05/18/18 0432  NA 135 137 135 136 136  K 3.3* 3.5 3.5 3.5 3.5  CL 99 105 106 108 108  CO2 25 20* 22 20* 21*   GLUCOSE 172* 164* 139* 116* 108*  BUN 27* 22 17 19 18   CREATININE 1.22 1.19 0.96 1.04 1.05  CALCIUM 8.2* 7.9* 7.4* 6.9* 6.5*   Liver Function Tests: Recent Labs  Lab 05/13/18 1338  AST 57*  ALT 24  ALKPHOS 112  BILITOT 1.0  PROT 6.9  ALBUMIN 3.5   No results for input(s): LIPASE, AMYLASE in the last 168 hours. No results for input(s): AMMONIA in the last 168 hours. CBC: Recent Labs  Lab 05/13/18 1338 05/14/18 0511 05/15/18 0617 05/15/18 1531 05/16/18 0730 05/16/18 1637 05/17/18 0419 05/18/18 0432  WBC 8.7 7.4 6.9  --  7.6  --  9.3 8.5  NEUTROABS 7.2  --   --   --   --   --   --   --   HGB 7.7* 7.2* 6.6* 7.6* 7.0* 8.0* 7.8* 7.8*  HCT 22.7* 21.2* 20.2* 22.5* 21.2* 22.8* 23.0* 23.0*  MCV 95.0 96.4 99.0  --  97.2  --  93.9 93.9  PLT 305 260 237  --  209  --  201 202   Cardiac Enzymes: Recent Labs  Lab 05/13/18 1338  TROPONINI <0.03   BNP: Invalid input(s): POCBNP CBG: Recent Labs  Lab 05/17/18 1120 05/17/18 1238 05/17/18 1631 05/17/18 2212 05/18/18 0723  GLUCAP 104* 103* 152* 162* 102*   D-Dimer No results for input(s): DDIMER in the last 72 hours. Hgb A1c No results for input(s): HGBA1C in the last 72 hours. Lipid Profile No results for input(s): CHOL, HDL, LDLCALC, TRIG, CHOLHDL, LDLDIRECT in the last 72 hours. Thyroid function studies No results for input(s): TSH, T4TOTAL, T3FREE, THYROIDAB in the last 72 hours.  Invalid input(s): FREET3 Anemia work up No results for input(s): VITAMINB12, FOLATE, FERRITIN, TIBC, IRON, RETICCTPCT in the last 72 hours. Urinalysis    Component Value Date/Time   COLORURINE AMBER (A) 05/13/2018 1339   APPEARANCEUR TURBID (A) 05/13/2018 1339   LABSPEC 1.018 05/13/2018 1339   PHURINE 5.0 05/13/2018 1339   GLUCOSEU 50 (A) 05/13/2018 1339   HGBUR NEGATIVE 05/13/2018 1339   BILIRUBINUR NEGATIVE 05/13/2018 1339   KETONESUR 5 (A) 05/13/2018 1339   PROTEINUR NEGATIVE 05/13/2018 1339   UROBILINOGEN 0.2 06/14/2008 1930    NITRITE NEGATIVE 05/13/2018 1339   LEUKOCYTESUR NEGATIVE 05/13/2018 1339   Sepsis Labs Invalid input(s): PROCALCITONIN,  WBC,  LACTICIDVEN Microbiology No results found for this or any previous visit (from the past 240 hour(s)).   Time coordinating discharge: 35 minutes  SIGNED:   Rodena Goldmann, DO [facility] 05/18/2018, 9:42 AM  If 7PM-7AM,  please contact night-coverage [email or web address] Password [email or web address]

## 2018-05-18 NOTE — Progress Notes (Signed)
Report called to [place], given dc instructions for assessment and ADL's, diet, meds, vitals, activity and FU appointments. All questions answered. Pt in no distress. Pt will be transported via car per son. IV dc'd, tele dc'd, will monitor until transported to dc area.

## 2018-05-18 NOTE — Care Management Important Message (Signed)
Important Message  Patient Details  Name: Darren Howard MRN: [id number] Date of Birth: 12-25-1950   Medicare Important Message Given:  Yes    Sherald Barge, RN 05/18/2018, 12:52 PM

## 2018-05-18 NOTE — Telephone Encounter (Signed)
Please arrange a telephone/virtual visit with patient in 2-3 weeks. Will need to arrange outpatient colonoscopy with Propofol.

## 2018-05-19 ENCOUNTER — Encounter (HOSPITAL_COMMUNITY): Payer: Self-pay | Admitting: Internal Medicine

## 2018-05-25 ENCOUNTER — Encounter: Payer: Self-pay | Admitting: Gastroenterology

## 2018-05-25 NOTE — Telephone Encounter (Signed)
SCHEDULED PATIENT AND MAILED LETTER, COULD NOT LEAVE A MESSAGE ON HIS PHONE OR HIS CONTACT'S NUMBER-NO VOICE MAIL

## 2018-05-27 ENCOUNTER — Inpatient Hospital Stay (HOSPITAL_COMMUNITY)
Admission: EM | Admit: 2018-05-27 | Discharge: 2018-06-01 | DRG: 640 | Disposition: A | Discharge status: Home Health | Payer: Medicare Other | Source: Skilled Nursing Facility | Attending: Internal Medicine | Admitting: Internal Medicine

## 2018-05-27 ENCOUNTER — Telehealth: Payer: Self-pay | Admitting: Nurse Practitioner

## 2018-05-27 ENCOUNTER — Encounter (HOSPITAL_COMMUNITY): Payer: Self-pay | Admitting: *Deleted

## 2018-05-27 ENCOUNTER — Other Ambulatory Visit: Payer: Self-pay

## 2018-05-27 DIAGNOSIS — K644 Residual hemorrhoidal skin tags: Secondary | ICD-10-CM | POA: Diagnosis not present

## 2018-05-27 DIAGNOSIS — Z9114 Patient's other noncompliance with medication regimen: Secondary | ICD-10-CM | POA: Diagnosis not present

## 2018-05-27 DIAGNOSIS — Z7984 Long term (current) use of oral hypoglycemic drugs: Secondary | ICD-10-CM

## 2018-05-27 DIAGNOSIS — I160 Hypertensive urgency: Secondary | ICD-10-CM | POA: Diagnosis not present

## 2018-05-27 DIAGNOSIS — E118 Type 2 diabetes mellitus with unspecified complications: Secondary | ICD-10-CM | POA: Diagnosis not present

## 2018-05-27 DIAGNOSIS — D649 Anemia, unspecified: Secondary | ICD-10-CM | POA: Diagnosis not present

## 2018-05-27 DIAGNOSIS — I10 Essential (primary) hypertension: Secondary | ICD-10-CM

## 2018-05-27 DIAGNOSIS — D120 Benign neoplasm of cecum: Secondary | ICD-10-CM | POA: Diagnosis not present

## 2018-05-27 DIAGNOSIS — Z8249 Family history of ischemic heart disease and other diseases of the circulatory system: Secondary | ICD-10-CM | POA: Diagnosis not present

## 2018-05-27 DIAGNOSIS — D123 Benign neoplasm of transverse colon: Secondary | ICD-10-CM | POA: Diagnosis not present

## 2018-05-27 DIAGNOSIS — K635 Polyp of colon: Secondary | ICD-10-CM | POA: Diagnosis not present

## 2018-05-27 DIAGNOSIS — R262 Difficulty in walking, not elsewhere classified: Secondary | ICD-10-CM | POA: Diagnosis not present

## 2018-05-27 DIAGNOSIS — K319 Disease of stomach and duodenum, unspecified: Secondary | ICD-10-CM | POA: Diagnosis present

## 2018-05-27 DIAGNOSIS — M6281 Muscle weakness (generalized): Secondary | ICD-10-CM | POA: Diagnosis not present

## 2018-05-27 DIAGNOSIS — R58 Hemorrhage, not elsewhere classified: Secondary | ICD-10-CM | POA: Diagnosis not present

## 2018-05-27 DIAGNOSIS — K264 Chronic or unspecified duodenal ulcer with hemorrhage: Secondary | ICD-10-CM | POA: Diagnosis present

## 2018-05-27 DIAGNOSIS — K921 Melena: Secondary | ICD-10-CM | POA: Diagnosis not present

## 2018-05-27 DIAGNOSIS — D638 Anemia in other chronic diseases classified elsewhere: Secondary | ICD-10-CM | POA: Diagnosis present

## 2018-05-27 DIAGNOSIS — Z809 Family history of malignant neoplasm, unspecified: Secondary | ICD-10-CM | POA: Diagnosis not present

## 2018-05-27 DIAGNOSIS — E559 Vitamin D deficiency, unspecified: Secondary | ICD-10-CM | POA: Diagnosis not present

## 2018-05-27 DIAGNOSIS — E785 Hyperlipidemia, unspecified: Secondary | ICD-10-CM | POA: Diagnosis not present

## 2018-05-27 DIAGNOSIS — E119 Type 2 diabetes mellitus without complications: Secondary | ICD-10-CM

## 2018-05-27 DIAGNOSIS — D509 Iron deficiency anemia, unspecified: Secondary | ICD-10-CM | POA: Diagnosis not present

## 2018-05-27 DIAGNOSIS — F1011 Alcohol abuse, in remission: Secondary | ICD-10-CM | POA: Diagnosis not present

## 2018-05-27 DIAGNOSIS — K648 Other hemorrhoids: Secondary | ICD-10-CM | POA: Diagnosis present

## 2018-05-27 DIAGNOSIS — F17210 Nicotine dependence, cigarettes, uncomplicated: Secondary | ICD-10-CM | POA: Diagnosis present

## 2018-05-27 DIAGNOSIS — E876 Hypokalemia: Secondary | ICD-10-CM

## 2018-05-27 DIAGNOSIS — K219 Gastro-esophageal reflux disease without esophagitis: Secondary | ICD-10-CM | POA: Diagnosis not present

## 2018-05-27 DIAGNOSIS — D500 Iron deficiency anemia secondary to blood loss (chronic): Secondary | ICD-10-CM | POA: Diagnosis not present

## 2018-05-27 DIAGNOSIS — R42 Dizziness and giddiness: Secondary | ICD-10-CM | POA: Diagnosis not present

## 2018-05-27 DIAGNOSIS — Z741 Need for assistance with personal care: Secondary | ICD-10-CM | POA: Diagnosis not present

## 2018-05-27 DIAGNOSIS — K922 Gastrointestinal hemorrhage, unspecified: Secondary | ICD-10-CM | POA: Diagnosis not present

## 2018-05-27 DIAGNOSIS — K643 Fourth degree hemorrhoids: Secondary | ICD-10-CM | POA: Diagnosis not present

## 2018-05-27 DIAGNOSIS — I4581 Long QT syndrome: Secondary | ICD-10-CM | POA: Diagnosis not present

## 2018-05-27 DIAGNOSIS — F1099 Alcohol use, unspecified with unspecified alcohol-induced disorder: Secondary | ICD-10-CM | POA: Diagnosis not present

## 2018-05-27 DIAGNOSIS — E1165 Type 2 diabetes mellitus with hyperglycemia: Secondary | ICD-10-CM | POA: Diagnosis not present

## 2018-05-27 HISTORY — DX: Gastrointestinal hemorrhage, unspecified: K92.2

## 2018-05-27 HISTORY — DX: Other diseases of stomach and duodenum: K31.89

## 2018-05-27 HISTORY — DX: Patient's noncompliance with other medical treatment and regimen: Z91.19

## 2018-05-27 HISTORY — DX: Duodenal ulcer, unspecified as acute or chronic, without hemorrhage or perforation: K26.9

## 2018-05-27 HISTORY — DX: Anemia, unspecified: D64.9

## 2018-05-27 HISTORY — DX: Patient's noncompliance with other medical treatment and regimen due to unspecified reason: Z91.199

## 2018-05-27 HISTORY — DX: Alcohol abuse, uncomplicated: F10.10

## 2018-05-27 LAB — COMPREHENSIVE METABOLIC PANEL
ALT: 24 U/L (ref 0–44)
AST: 44 U/L — ABNORMAL HIGH (ref 15–41)
Albumin: 2 g/dL — ABNORMAL LOW (ref 3.5–5.0)
Alkaline Phosphatase: 92 U/L (ref 38–126)
Anion gap: 10 (ref 5–15)
BUN: 13 mg/dL (ref 8–23)
CO2: 16 mmol/L — ABNORMAL LOW (ref 22–32)
Calcium: 5.4 mg/dL — CL (ref 8.9–10.3)
Chloride: 113 mmol/L — ABNORMAL HIGH (ref 98–111)
Creatinine, Ser: 0.83 mg/dL (ref 0.61–1.24)
GFR calc Af Amer: >60 mL/min (ref >60)
GFR calc non Af Amer: >60 mL/min (ref >60)
Glucose, Bld: 135 mg/dL — ABNORMAL HIGH (ref 70–99)
Potassium: 3.7 mmol/L (ref 3.5–5.1)
Sodium: 139 mmol/L (ref 135–145)
Total Bilirubin: 0.4 mg/dL (ref 0.3–1.2)
Total Protein: 4.9 g/dL — ABNORMAL LOW (ref 6.5–8.1)

## 2018-05-27 LAB — URINALYSIS, ROUTINE W REFLEX MICROSCOPIC
Bilirubin Urine: NEGATIVE
Glucose, UA: NEGATIVE mg/dL
Hgb urine dipstick: NEGATIVE
Ketones, ur: NEGATIVE mg/dL
Leukocytes,Ua: NEGATIVE
Nitrite: NEGATIVE
Protein, ur: NEGATIVE mg/dL
Specific Gravity, Urine: 1.025 (ref 1.005–1.030)
pH: 5 (ref 5.0–8.0)

## 2018-05-27 LAB — MRSA PCR SCREENING: MRSA by PCR: NEGATIVE

## 2018-05-27 LAB — BASIC METABOLIC PANEL
Anion gap: 8 (ref 5–15)
BUN: 12 mg/dL (ref 8–23)
CO2: 18 mmol/L — ABNORMAL LOW (ref 22–32)
Calcium: 5.4 mg/dL — CL (ref 8.9–10.3)
Chloride: 113 mmol/L — ABNORMAL HIGH (ref 98–111)
Creatinine, Ser: 0.8 mg/dL (ref 0.61–1.24)
GFR calc Af Amer: >60 mL/min (ref >60)
GFR calc non Af Amer: >60 mL/min (ref >60)
Glucose, Bld: 136 mg/dL — ABNORMAL HIGH (ref 70–99)
Potassium: 3.1 mmol/L — ABNORMAL LOW (ref 3.5–5.1)
Sodium: 139 mmol/L (ref 135–145)

## 2018-05-27 LAB — CBC WITH DIFFERENTIAL/PLATELET
Abs Immature Granulocytes: 0.04 10*3/uL (ref 0.00–0.07)
Basophils Absolute: 0.1 10*3/uL (ref 0.0–0.1)
Basophils Relative: 1 %
Eosinophils Absolute: 0.1 10*3/uL (ref 0.0–0.5)
Eosinophils Relative: 2 %
HCT: 23.6 % — ABNORMAL LOW (ref 39.0–52.0)
Hemoglobin: 7.7 g/dL — ABNORMAL LOW (ref 13.0–17.0)
Immature Granulocytes: 1 %
Lymphocytes Relative: 15 %
Lymphs Abs: 1.3 10*3/uL (ref 0.7–4.0)
MCH: 30.9 pg (ref 26.0–34.0)
MCHC: 32.6 g/dL (ref 30.0–36.0)
MCV: 94.8 fL (ref 80.0–100.0)
Monocytes Absolute: 0.6 10*3/uL (ref 0.1–1.0)
Monocytes Relative: 7 %
Neutro Abs: 6.5 10*3/uL (ref 1.7–7.7)
Neutrophils Relative %: 74 %
Platelets: 347 10*3/uL (ref 150–400)
RBC: 2.49 MIL/uL — ABNORMAL LOW (ref 4.22–5.81)
RDW: 16.2 % — ABNORMAL HIGH (ref 11.5–15.5)
WBC: 8.7 10*3/uL (ref 4.0–10.5)
nRBC: 0 % (ref 0.0–0.2)

## 2018-05-27 LAB — MAGNESIUM
Magnesium: 1.1 mg/dL — ABNORMAL LOW (ref 1.7–2.4)
Magnesium: 1 mg/dL — ABNORMAL LOW (ref 1.7–2.4)

## 2018-05-27 LAB — HEPATIC FUNCTION PANEL
ALT: 26 U/L (ref 0–44)
AST: 44 U/L — ABNORMAL HIGH (ref 15–41)
Albumin: 2.3 g/dL — ABNORMAL LOW (ref 3.5–5.0)
Alkaline Phosphatase: 110 U/L (ref 38–126)
Bilirubin, Direct: 0.3 mg/dL — ABNORMAL HIGH (ref 0.0–0.2)
Indirect Bilirubin: 0.3 mg/dL (ref 0.3–0.9)
Total Bilirubin: 0.6 mg/dL (ref 0.3–1.2)
Total Protein: 5.1 g/dL — ABNORMAL LOW (ref 6.5–8.1)

## 2018-05-27 LAB — HEMOGLOBIN: Hemoglobin: 6.8 g/dL — CL (ref 13.0–17.0)

## 2018-05-27 MED ORDER — INSULIN ASPART 100 UNIT/ML ~~LOC~~ SOLN
0.0000 [IU] | Freq: Three times a day (TID) | SUBCUTANEOUS | Status: DC
  Administered 2018-05-29: 1 [IU] via SUBCUTANEOUS

## 2018-05-27 MED ORDER — SODIUM CHLORIDE 0.9 % IV SOLN: 1.0000 g | Freq: Once | INTRAVENOUS | Status: DC

## 2018-05-27 MED ORDER — POTASSIUM CHLORIDE CRYS ER 20 MEQ PO TBCR
40.0000 meq | EXTENDED_RELEASE_TABLET | Freq: Once | ORAL | Status: AC
  Administered 2018-05-27: 40 meq via ORAL
  Filled 2018-05-27: qty 2

## 2018-05-27 MED ORDER — ACETAMINOPHEN 650 MG RE SUPP: 650.0000 mg | Freq: Four times a day (QID) | RECTAL | Status: DC | PRN

## 2018-05-27 MED ORDER — MAGNESIUM SULFATE IN D5W 1-5 GM/100ML-% IV SOLN
INTRAVENOUS | Status: AC
  Filled 2018-05-27: qty 100

## 2018-05-27 MED ORDER — ATORVASTATIN CALCIUM 10 MG PO TABS
10.0000 mg | ORAL_TABLET | Freq: Every day | ORAL | Status: DC
  Administered 2018-05-27 – 2018-05-31 (×5): 10 mg via ORAL
  Filled 2018-05-27 (×5): qty 1

## 2018-05-27 MED ORDER — POTASSIUM CHLORIDE 10 MEQ/100ML IV SOLN
10.0000 meq | INTRAVENOUS | Status: AC
  Administered 2018-05-27 (×4): 10 meq via INTRAVENOUS
  Filled 2018-05-27 (×2): qty 100

## 2018-05-27 MED ORDER — MAGNESIUM SULFATE IN D5W 1-5 GM/100ML-% IV SOLN
1.0000 g | INTRAVENOUS | Status: AC
  Administered 2018-05-27 – 2018-05-28 (×3): 1 g via INTRAVENOUS
  Filled 2018-05-27 (×4): qty 100

## 2018-05-27 MED ORDER — SODIUM CHLORIDE 0.9% IV SOLUTION
Freq: Once | INTRAVENOUS | Status: AC
  Administered 2018-05-28: 02:00:00 via INTRAVENOUS

## 2018-05-27 MED ORDER — VITAMIN B-1 100 MG PO TABS
100.0000 mg | ORAL_TABLET | Freq: Every day | ORAL | Status: DC
  Administered 2018-05-28 – 2018-06-01 (×5): 100 mg via ORAL
  Filled 2018-05-27 (×5): qty 1

## 2018-05-27 MED ORDER — FERROUS SULFATE 325 (65 FE) MG PO TABS
325.0000 mg | ORAL_TABLET | Freq: Three times a day (TID) | ORAL | Status: DC
  Administered 2018-05-28 – 2018-05-30 (×7): 325 mg via ORAL
  Filled 2018-05-27 (×8): qty 1

## 2018-05-27 MED ORDER — FOLIC ACID 1 MG PO TABS
1.0000 mg | ORAL_TABLET | Freq: Every day | ORAL | Status: DC
  Administered 2018-05-28 – 2018-06-01 (×5): 1 mg via ORAL
  Filled 2018-05-27 (×5): qty 1

## 2018-05-27 MED ORDER — CALCIUM GLUCONATE-NACL 1-0.675 GM/50ML-% IV SOLN
1.0000 g | Freq: Once | INTRAVENOUS | Status: AC
  Administered 2018-05-27: 1000 mg via INTRAVENOUS
  Filled 2018-05-27: qty 50

## 2018-05-27 MED ORDER — ACETAMINOPHEN 325 MG PO TABS
650.0000 mg | ORAL_TABLET | Freq: Four times a day (QID) | ORAL | Status: DC | PRN
  Administered 2018-05-27: 650 mg via ORAL
  Filled 2018-05-27: qty 2

## 2018-05-27 MED ORDER — PANTOPRAZOLE SODIUM 40 MG PO TBEC
40.0000 mg | DELAYED_RELEASE_TABLET | Freq: Every day | ORAL | Status: DC
  Administered 2018-05-28 – 2018-06-01 (×5): 40 mg via ORAL
  Filled 2018-05-27 (×5): qty 1

## 2018-05-27 MED ORDER — CARVEDILOL 12.5 MG PO TABS
25.0000 mg | ORAL_TABLET | Freq: Two times a day (BID) | ORAL | Status: DC
  Administered 2018-05-27 – 2018-05-31 (×9): 25 mg via ORAL
  Filled 2018-05-27: qty 8
  Filled 2018-05-27 (×4): qty 2
  Filled 2018-05-27: qty 8
  Filled 2018-05-27 (×4): qty 2

## 2018-05-27 NOTE — ED Notes (Signed)
CRITICAL VALUE ALERT  Critical Value:  Ca 5.4  Date & Time Notied: 1433  Provider Notified: [email or web address]  Orders Received/Actions taken:

## 2018-05-27 NOTE — ED Notes (Signed)
Pt denies any pain or SOB.

## 2018-05-27 NOTE — ED Triage Notes (Signed)
Pt sent here for anemia from [facility].

## 2018-05-27 NOTE — ED Provider Notes (Signed)
[facility] EMERGENCY DEPARTMENT Provider Note   CSN: [id number] Arrival date & time: 05/27/18  1326    History   Chief Complaint Chief Complaint  Patient presents with  . Anemia    HPI Darren Howard is a 68 y.o. male.     HPI  Pt was seen at 1345. Per EMS, NH report and pt: Pt sent to the ED for c/o unknown onset and persistence of constant "low hemoglobin level" that was noted on recent labs obtained at [facility].  Pt was admitted to the hospital last month for GI bleed, had EGD completed, and is due for outpatient colonoscopy. Pt states he "feels fine" and is not sure why he was sent to the ED. Describes his stools as "brown." Denies abd pain, no N/V/D, no fevers, no CP/SOB, no black or blood in stools.     Past Medical History:  Diagnosis Date  . Alcohol abuse   . Anemia   . Diabetes mellitus without complication ([place])   . Duodenal erosion   . Erosive gastropathy   . GI bleed 04/2018  . Glucose intolerance (impaired glucose tolerance)   . Hyperlipemia   . Hypertension   . Noncompliance     Patient Active Problem List   Diagnosis Date Noted  . Dizziness 05/13/2018  . Epistaxis 02/13/2014  . Hypertension 02/13/2014  . Anemia 02/13/2014  . DM2 (diabetes mellitus, type 2) ([place]) 02/13/2014  . Tachycardia 02/13/2014  . Hypertensive urgency     Past Surgical History:  Procedure Laterality Date  . BIOPSY  05/17/2018   Procedure: BIOPSY;  Surgeon: Daneil Dolin, MD;  Location: [facility] ENDO SUITE;  Service: Endoscopy;;  . CARDIAC CATHETERIZATION    . ESOPHAGOGASTRODUODENOSCOPY (EGD) WITH PROPOFOL N/A 05/17/2018   Procedure: ESOPHAGOGASTRODUODENOSCOPY (EGD) WITH PROPOFOL;  Surgeon: Daneil Dolin, MD;  Location: [facility] END[facility] SUITE;  Service: Endoscopy;  Laterality: N/A;  . HERNIA REPAIR          Home Medications    Prior to Admission medications   Medication Sig Start Date End Date Taking? Authorizing Provider  amLODipine (NORVASC) 10 MG tablet Take 10 mg by mouth daily.    Yes [provider]  atorvastatin (LIPITOR) 10 MG tablet Take 10 mg by mouth at bedtime.   Yes [provider]  carvedilol (COREG) 25 MG tablet Take 25 mg by mouth 2 (two) times daily.   Yes [provider]  ferrous sulfate 325 (65 FE) MG tablet Take 325 mg by mouth 3 (three) times daily.   Yes [provider]  folic acid (FOLVITE) 1 MG tablet Take 1 tablet (1 mg total) by mouth daily for 30 days. 05/19/18 06/18/18 Yes Shah, Pratik D, DO  metFORMIN (GLUCOPHAGE-XR) 500 MG 24 hr tablet Take 500 mg by mouth 2 (two) times daily.    Yes [provider]  pantoprazole (PROTONIX) 40 MG tablet Take 1 tablet (40 mg total) by mouth daily for 30 days. 05/19/18 06/18/18 Yes Shah, Pratik D, DO  polyethylene glycol (MIRALAX / GLYCOLAX) 17 g packet Take 17 g by mouth daily.   Yes [provider]  senna (SENOKOT) 8.6 MG TABS tablet Take 1 tablet by mouth 2 (two) times daily.   Yes [provider]  thiamine 100 MG tablet Take 1 tablet (100 mg total) by mouth daily for 30 days. 05/19/18 06/18/18 Yes Shah, Pratik D, DO  Vitamin D, Ergocalciferol, (DRISDOL) 1.25 MG (50000 UT) CAPS capsule Take 50,000 Units by mouth every 7 (seven) days.  Yes [provider]    Family History Family History  Problem Relation Age of Onset  . Congestive Heart Failure Mother   . Congestive Heart Failure Father   . Cancer Brother        unknown kind   . Colon cancer Neg Hx   . Colon polyps Neg Hx     Social History Social History   Tobacco Use  . Smoking status: Current Every Day Smoker    Packs/day: 0.50    Years: 30.00    Pack years: 15.00    Types: Cigarettes  . Smokeless tobacco: Never Used  Substance Use Topics  . Alcohol use: Yes    Alcohol/week: 0.0 standard drinks    Comment: pint per day   . Drug use: No     Allergies   Patient has no known allergies.   Review of Systems Review of Systems ROS: Statement: All systems negative except as marked  or noted in the HPI; Constitutional: Negative for fever and chills. ; ; Eyes: Negative for eye pain, redness and discharge. ; ; ENMT: Negative for ear pain, hoarseness, nasal congestion, sinus pressure and sore throat. ; ; Cardiovascular: Negative for chest pain, palpitations, diaphoresis, dyspnea and peripheral edema. ; ; Respiratory: Negative for cough, wheezing and stridor. ; ; Gastrointestinal: Negative for nausea, vomiting, diarrhea, abdominal pain, blood in stool, hematemesis, jaundice and rectal bleeding. . ; ; Genitourinary: Negative for dysuria, flank pain and hematuria. ; ; Musculoskeletal: Negative for back pain and neck pain. Negative for swelling and trauma.; ; Skin: Negative for pruritus, rash, abrasions, blisters, bruising and skin lesion.; ; Neuro: Negative for headache, lightheadedness and neck stiffness. Negative for weakness, altered level of consciousness, altered mental status, extremity weakness, paresthesias, involuntary movement, seizure and syncope.       Physical Exam Updated Vital Signs BP 135/86 (BP Location: Right Arm)   Pulse 81   Temp 99.3 F (37.4 C) (Oral)   Resp 16   Ht 6\' 1"  (1.854 m)   Wt 94.8 kg   SpO2 100%   BMI 27.57 kg/m    Patient Vitals for the past 24 hrs:  BP Temp Temp src Pulse Resp SpO2 Height Weight  05/27/18 1600 127/82 - - (!) 59 19 96 % - -  05/27/18 1530 114/63 - - 95 16 100 % - -  05/27/18 1500 120/69 - - 90 18 100 % - -  05/27/18 1430 137/77 - - 89 (!) 23 100 % - -  05/27/18 1400 136/72 - - 80 20 100 % - -  05/27/18 1332 135/86 99.3 F (37.4 C) Oral 81 16 100 % - -  05/27/18 1330 135/86 - - 82 - 100 % - -  05/27/18 1327 - - - - - - 6\' 1"  (1.854 m) 94.8 kg   14:59 Orthostatic Vital Signs FS  Orthostatic Lying   BP- Lying: 137/70  Pulse- Lying: 79      Orthostatic Sitting  BP- Sitting: 120/69  Pulse- Sitting: 87      Orthostatic Standing at 0 minutes  BP- Standing at 0 minutes: 124/63  Pulse- Standing at 0 minutes: 95      Physical Exam 1350: Physical examination:  Nursing notes reviewed; Vital signs and O2 SAT reviewed;  Constitutional: Well developed, Well nourished, Well hydrated, In no acute distress; Head:  Normocephalic, atraumatic; Eyes: EOMI, PERRL, No scleral icterus; ENMT: Mouth and pharynx normal, Mucous membranes moist; Neck: Supple, Full range of motion, No lymphadenopathy; Cardiovascular: Regular  rate and rhythm, No gallop; Respiratory: Breath sounds clear & equal bilaterally, No wheezes.  Speaking full sentences with ease, Normal respiratory effort/excursion; Chest: Nontender, Movement normal; Abdomen: Soft, Nontender, Nondistended, Normal bowel sounds; Genitourinary: No CVA tenderness; Extremities: Peripheral pulses normal, No tenderness, +2 pedal edema bilat. No calf tenderness or asymmetry.; Neuro: AA&Ox3, tangential historian. Major CN grossly intact.  Speech clear. No gross focal motor or sensory deficits in extremities.; Skin: Color normal, Warm, Dry.   ED Treatments / Results  Labs (all labs ordered are listed, but only abnormal results are displayed)   EKG EKG Interpretation  Date/Time:  Thursday May 27 2018 16:30:55 EDT Ventricular Rate:  69 PR Interval:    QRS Duration: 97 QT Interval:  453 QTC Calculation: 486 R Axis:   -16 Text Interpretation:  Sinus rhythm Multiple premature complexes, vent & supraven Borderline left axis deviation Low voltage, precordial leads Borderline prolonged QT interval No STEMI.  Confirmed by Nanda Quinton [phone]) on 05/27/2018 4:35:24 PM      Radiology   Procedures Procedures (including critical care time)  Medications Ordered in ED Medications - No data to display   Initial Impression / Assessment and Plan / ED Course  I have reviewed the triage vital signs and the nursing notes.  Pertinent labs & imaging results that were available during my care of the patient were reviewed by me and considered in my medical decision making (see chart for  details).     MDM Reviewed: previous chart, nursing note and vitals Reviewed previous: labs and ECG Interpretation: labs and ECG Total time providing critical care: 30-74 minutes. This excludes time spent performing separately reportable procedures and services. Consults: admitting MD    CRITICAL CARE Performed by: Francine Graven Total critical care time: 35 minutes Critical care time was exclusive of separately billable procedures and treating other patients. Critical care was necessary to treat or prevent imminent or life-threatening deterioration. Critical care was time spent personally by me on the following activities: development of treatment plan with patient and/or surrogate as well as nursing, discussions with consultants, evaluation of patient's response to treatment, examination of patient, obtaining history from patient or surrogate, ordering and performing treatments and interventions, ordering and review of laboratory studies, ordering and review of radiographic studies, pulse oximetry and re-evaluation of patient's condition.   Results for orders placed or performed during the hospital encounter of 05/27/18  CBC with Differential  Result Value Ref Range   WBC 8.7 4.0 - 10.5 K/uL   RBC 2.49 (L) 4.22 - 5.81 MIL/uL   Hemoglobin 7.7 (L) 13.0 - 17.0 g/dL   HCT 23.6 (L) 39.0 - 52.0 %   MCV 94.8 80.0 - 100.0 fL   MCH 30.9 26.0 - 34.0 pg   MCHC 32.6 30.0 - 36.0 g/dL   RDW 16.2 (H) 11.5 - 15.5 %   Platelets 347 150 - 400 K/uL   nRBC 0.0 0.0 - 0.2 %   Neutrophils Relative % 74 %   Neutro Abs 6.5 1.7 - 7.7 K/uL   Lymphocytes Relative 15 %   Lymphs Abs 1.3 0.7 - 4.0 K/uL   Monocytes Relative 7 %   Monocytes Absolute 0.6 0.1 - 1.0 K/uL   Eosinophils Relative 2 %   Eosinophils Absolute 0.1 0.0 - 0.5 K/uL   Basophils Relative 1 %   Basophils Absolute 0.1 0.0 - 0.1 K/uL   Immature Granulocytes 1 %   Abs Immature Granulocytes 0.04 0.00 - 0.07 K/uL  Basic metabolic panel  Result Value Ref Range   Sodium 139 135 - 145 mmol/L   Potassium 3.1 (L) 3.5 - 5.1 mmol/L   Chloride 113 (H) 98 - 111 mmol/L   CO2 18 (L) 22 - 32 mmol/L   Glucose, Bld 136 (H) 70 - 99 mg/dL   BUN 12 8 - 23 mg/dL   Creatinine, Ser 0.80 0.61 - 1.24 mg/dL   Calcium 5.4 (LL) 8.9 - 10.3 mg/dL   GFR calc non Af Amer >60 >60 mL/min   GFR calc Af Amer >60 >60 mL/min   Anion gap 8 5 - 15  Hepatic function panel  Result Value Ref Range   Total Protein 5.1 (L) 6.5 - 8.1 g/dL   Albumin 2.3 (L) 3.5 - 5.0 g/dL   AST 44 (H) 15 - 41 U/L   ALT 26 0 - 44 U/L   Alkaline Phosphatase 110 38 - 126 U/L   Total Bilirubin 0.6 0.3 - 1.2 mg/dL   Bilirubin, Direct 0.3 (H) 0.0 - 0.2 mg/dL   Indirect Bilirubin 0.3 0.3 - 0.9 mg/dL     Results for HEARL, HEIKES (MRN [id number]) as of 05/27/2018 14:19  Ref. Range 05/16/2018 07:30 05/16/2018 16:37 05/17/2018 04:19 05/18/2018 04:32 05/27/2018 13:53  Hemoglobin Latest Ref Range: 13.0 - 17.0 g/dL 7.0 (L) 8.0 (L) 7.8 (L) 7.8 (L) 7.7 (L)  HCT Latest Ref Range: 39.0 - 52.0 % 21.2 (L) 22.8 (L) 23.0 (L) 23.0 (L) 23.6 (L)    1625:  H/H per baseline. Calcium corrects to 6.8 for albumin, new hypocalcemia; IV calcium ordered. New hypokalemia; potasium given PO. Magnesium level pending. T/C returned from [facility] Velia Meyer, case discussed, including:  HPI, pertinent PM/SHx, VS/PE, dx testing, ED course and treatment:  Agreeable to admit.      Final Clinical Impressions(s) / ED Diagnoses   Final diagnoses:  None    ED Discharge Orders    None       Francine Graven, DO 05/31/18 1249

## 2018-05-27 NOTE — Care Management Obs Status (Signed)
[place] NOTIFICATION   Patient Details  Name: Mylo Choi MRN: [id number] Date of Birth: 1950-10-28   Medicare Observation Status Notification Given:  Yes    Katura Eatherly Dimitri Ped, LCSW 05/27/2018, 7:21 PM

## 2018-05-27 NOTE — H&P (Signed)
History and Physical    PLEASE NOTE THAT DRAGON DICTATION SOFTWARE WAS USED IN THE CONSTRUCTION OF THIS NOTE.   Darren Howard EQA:[id number] DOB: [birth year] DOA: 05/27/2018  PCP: [name], MD Patient coming from: [facility] in [place] ([facility]  I have personally briefly reviewed patient's old medical records in [place]  Chief Complaint: low Hgb  HPI: Darren Howard is a 68 y.o. male with medical history significant for upper GI bleed in March 2020, hypertension, type 2 diabetes mellitus, who is admitted to [facility] on 05/27/2018 with multiple electrolyte abnormalities, including hypokalemia, hypomagnesemia, and hypocalcemia, after presenting from [facility] to the [facility] ED for evaluation of low hemoglobin.   The patient was recently hospitalized at [facility] from 05/13/2018 to 05/18/2018 for acute upper gastrointestinal bleed after presenting with complaint of dizziness, with laboratory evaluation notable for decline hemoglobin, and heme positive stool.  He underwent EGD on 05/17/2018, which demonstrated erosive gastropathy as well as duodenal erosion believed to be secondary to NSAID use, but in the absence of any evidence of active bleeding.  Over the course of his hospitalization, he required a total transfusion of 2 units PRBC, with lowest hemoglobin value over that course noted to be 6.6.  On 05/18/2018, the patient was discharged to [facility] with discharge hemoglobin value of 7.8.  He was discharged on Protonix, instructions to abstain from any NSAIDs, and has follow-up scheduled with Roseanne Kaufman, NP working with [facility] on 06/16/18 in order to arrange for colonoscopy to occur as an outpatient.  The patient confirms that he has had absolutely no NSAIDs following discharge from the hospital.  Following discharge from the hospital on 05/18/2018, the patient reports complete resolution of his presenting dizziness.  He reports  that he has been cognizant and evaluating the coloration of his stool following discharge from the hospital, and denies any finding of melena or hematochezia.  Following discharge, he denies any shortness of breath, chest pain, abdominal pain, nausea, vomiting, or diarrhea.  He reportedly had a routine follow-up CBC drawn on either 05/25/2018 or 05/26/2018, which found updated hemoglobin value to be 7.1.  RN working at [employer] called the on-call GI provider, Walden Field, NP, who recommended that if there was concern regarding this value, that the patient should present to the emergency department for further evaluation.  Subsequently, the patient was brought via EMS to [facility] emergency department for further evaluation of the aforementioned hemoglobin value.     ED Course: Vital signs in the emergency department were notable for the following: Temperature max 99.3; heart rate 81-95; blood pressure ranged from 114/63-135/86; respiratory rate 16-20, and oxygen saturation 96 to 100% on room air.  Labs performed in the ED were notable for the following: CMP notable for sodium 139, potassium 3.1, creatinine 113, bicarbonate 18, anion gap 8, BUN 12 compared to most recent prior value of 18 on 05/18/2018, creatinine 0.8 which is relative to 1.05 on 05/18/2018, calcium 5.4, and albumin 2.3.  Serum magnesium was found to be 1.1.  Repeat CBC performed in the emergency department today was notable for white blood cell count of 8700, hemoglobin 7.7 compared to 7.8 on 05/18/2018, and platelets 347.   The emergency department physician, Dr. Thurnell Garbe, discussed the patient's case with the on-call GI provider, Walden Field, NP, who felt that if the setting of the stable hemoglobin value, hemodynamic stability, and an asymptomatic patient, that hospitalization on the basis of  GI factors was not warranted at this time, but rather recommended maintaining existing plan for pursuit of outpatient colonoscopy, including  maintaining existing outpatient GI follow-up scheduled for 06/16/18 as the next step in this sequence.  However, in the context of today's laboratory evaluation also revealing multiple electrolyte abnormalities, the decision was made to admit the patient for overnight observation for additional evaluation and management of presenting hypokalemia, hypocalcemia, and hypomagnesemia.  While still in the emergency department, the patient received potassium chloride 40 mEq p.o. x1.    Review of Systems: As per HPI otherwise 10 point review of systems negative.   Past Medical History:  Diagnosis Date  . Alcohol abuse   . Anemia   . Diabetes mellitus without complication ([place])   . Duodenal erosion   . Erosive gastropathy   . GI bleed 04/2018  . Glucose intolerance (impaired glucose tolerance)   . Hyperlipemia   . Hypertension   . Noncompliance     Past Surgical History:  Procedure Laterality Date  . BIOPSY  05/17/2018   Procedure: BIOPSY;  Surgeon: Daneil Dolin, MD;  Location: [facility];  Service: Endoscopy;;  . CARDIAC CATHETERIZATION    . ESOPHAGOGASTRODUODENOSCOPY (EGD) WITH PROPOFOL N/A 05/17/2018   Procedure: ESOPHAGOGASTRODUODENOSCOPY (EGD) WITH PROPOFOL;  Surgeon: Daneil Dolin, MD;  Location: [facility];  Service: Endoscopy;  Laterality: N/A;  . HERNIA REPAIR      Social History:  reports that he has been smoking cigarettes. He has a 15.00 pack-year smoking history. He has never used smokeless tobacco. He reports current alcohol use. He reports that he does not use drugs.   No Known Allergies  Family History  Problem Relation Age of Onset  . Congestive Heart Failure Mother   . Congestive Heart Failure Father   . Cancer Brother        unknown kind   . Colon cancer Neg Hx   . Colon polyps Neg Hx      Prior to Admission medications   Medication Sig Start Date End Date Taking? Authorizing Provider  amLODipine (NORVASC) 10 MG tablet Take 10 mg by mouth daily.    Yes [provider]  atorvastatin (LIPITOR) 10 MG tablet Take 10 mg by mouth at bedtime.   Yes [provider]  carvedilol (COREG) 25 MG tablet Take 25 mg by mouth 2 (two) times daily.   Yes [provider]  ferrous sulfate 325 (65 FE) MG tablet Take 325 mg by mouth 3 (three) times daily.   Yes [provider]  folic acid (FOLVITE) 1 MG tablet Take 1 tablet (1 mg total) by mouth daily for 30 days. 05/19/18 06/18/18 Yes Shah, Pratik D, DO  metFORMIN (GLUCOPHAGE-XR) 500 MG 24 hr tablet Take 500 mg by mouth 2 (two) times daily.    Yes [provider]  pantoprazole (PROTONIX) 40 MG tablet Take 1 tablet (40 mg total) by mouth daily for 30 days. 05/19/18 06/18/18 Yes Shah, Pratik D, DO  polyethylene glycol (MIRALAX / GLYCOLAX) 17 g packet Take 17 g by mouth daily.   Yes [provider]  senna (SENOKOT) 8.6 MG TABS tablet Take 1 tablet by mouth 2 (two) times daily.   Yes [provider]  thiamine 100 MG tablet Take 1 tablet (100 mg total) by mouth daily for 30 days. 05/19/18 06/18/18 Yes Shah, Pratik D, DO  Vitamin D, Ergocalciferol, (DRISDOL) 1.25 MG (50000 UT) CAPS capsule Take 50,000 Units by mouth every 7 (seven) days.  Yes [provider]     Objective     Physical Exam: Vitals:   05/27/18 1400 05/27/18 1430 05/27/18 1500 05/27/18 1530  BP: 136/72 137/77 120/69 114/63  Pulse: 80 89 90 95  Resp: 20 (!) 23 18 16   Temp:      TempSrc:      SpO2: 100% 100% 100% 100%  Weight:      Height:        General: appears to be stated age; alert, oriented Skin: warm, dry, no rash Head:  AT/[place] Mouth:  Oral mucosa membranes appear moist, normal dentition Neck: supple; trachea midline Heart:  RRR; did not appreciate any M/R/G Lungs: CTAB, did not appreciate any wheezes, rales, or rhonchi Abdomen: + BS; soft, ND, NT Extremities: 1+ edema in b/l LE's; no muscle wasting   Labs on Admission: I have personally reviewed following labs and  imaging studies  CBC: Recent Labs  Lab 05/27/18 1353  WBC 8.7  NEUTROABS 6.5  HGB 7.7*  HCT 23.6*  MCV 94.8  PLT 979   Basic Metabolic Panel: Recent Labs  Lab 05/27/18 1353  NA 139  K 3.1*  CL 113*  CO2 18*  GLUCOSE 136*  BUN 12  CREATININE 0.80  CALCIUM 5.4*   GFR: Estimated Creatinine Clearance: 104 mL/min (by C-G formula based on SCr of 0.8 mg/dL). Liver Function Tests: Recent Labs  Lab 05/27/18 1353  AST 44*  ALT 26  ALKPHOS 110  BILITOT 0.6  PROT 5.1*  ALBUMIN 2.3*   No results for input(s): LIPASE, AMYLASE in the last 168 hours. No results for input(s): AMMONIA in the last 168 hours. Coagulation Profile: No results for input(s): INR, PROTIME in the last 168 hours. Cardiac Enzymes: No results for input(s): CKTOTAL, CKMB, CKMBINDEX, TROPONINI in the last 168 hours. BNP (last 3 results) No results for input(s): PROBNP in the last 8760 hours. HbA1C: No results for input(s): HGBA1C in the last 72 hours. CBG: No results for input(s): GLUCAP in the last 168 hours. Lipid Profile: No results for input(s): CHOL, HDL, LDLCALC, TRIG, CHOLHDL, LDLDIRECT in the last 72 hours. Thyroid Function Tests: No results for input(s): TSH, T4TOTAL, FREET4, T3FREE, THYROIDAB in the last 72 hours. Anemia Panel: No results for input(s): VITAMINB12, FOLATE, FERRITIN, TIBC, IRON, RETICCTPCT in the last 72 hours. Urine analysis:    Component Value Date/Time   COLORURINE AMBER (A) 05/13/2018 1339   APPEARANCEUR TURBID (A) 05/13/2018 1339   LABSPEC 1.018 05/13/2018 1339   PHURINE 5.0 05/13/2018 1339   GLUCOSEU 50 (A) 05/13/2018 1339   HGBUR NEGATIVE 05/13/2018 1339   BILIRUBINUR NEGATIVE 05/13/2018 1339   KETONESUR 5 (A) 05/13/2018 1339   PROTEINUR NEGATIVE 05/13/2018 1339   UROBILINOGEN 0.2 06/14/2008 1930   NITRITE NEGATIVE 05/13/2018 1339   LEUKOCYTESUR NEGATIVE 05/13/2018 1339    Radiological Exams on Admission: No results found.    Assessment/Plan   Darren Howard is a 67 y.o. male with medical history significant for upper GI bleed in March 2020, hypertension, type 2 diabetes mellitus, who is admitted to [facility] on 05/27/2018 with multiple electrolyte abnormalities, including hypokalemia, hypomagnesemia, and hypocalcemia, after presenting from [facility] to the [facility] ED for evaluation of low hemoglobin.    Principal Problem:   Hypokalemia Active Problems:   Hypertension   Anemia   DM2 (diabetes mellitus, type 2) (HCC)   Hypomagnesemia   Hypocalcemia    #) Hypokalemia: Presenting serum potassium found to be 3.1.  Differential includes  type I renal tubular acidosis in the context of concomitant hyperchloremia with non-anion gap metabolic acidosis.  Of note, serum magnesium level also found to be low at 1.1, as further described below.  The patient received potassium chloride 40 equivalents p.o. x1 in the ED today.  Plan: I have ordered an additional 40 mEq of IV potassium chloride to be given over 4 hours, with repeat CMP ordered for 2200.  Work-up and management of hypomagnesemia, as further described below.  Monitor on telemetry.  Repeat BMP in the morning.  Check urinalysis. Check EKG.     #) Hypomagnesemia: Serum magnesium level found to be 1.1.  Adequate correction of this value will be of benefit in correcting concomitant hypokalemic and hypocalcemic findings.   Plan: Magnesium sulfate 3 g IV over 3 hours.  Repeat serum magnesium level at 2200, and again in the morning.  Water on telemetry.     #) Hypocalcemia: Presenting labs reflect serum calcium level of 6.8, after correction for hypoalbuminemia.  Patient is currently receiving 1 g of calcium gluconate IV over 1 hour. However, will not be more aggressive and supplementing this value until hypomagnesemic value has improved as above.   Plan: Supplementation of hypomagnesemia, as above.  Repeat CMP and serum magnesium level at 2200, at which time additional  supplementation via calcium gluconate is anticipated.  Check ionized calcium in the morning.     #) Subacute anemia: In the context of a new finding of anemia of multifactorial etiology in March 2020, with contributions at that time from acute blood loss anemia in the setting of upper GI bleed, as described above, in addition to finding of folic acid deficiency in the setting of a history of alcohol abuse, today's laboratory evaluation reveals a stable hemoglobin of 7.7 relative to 7.8 at the time of discharge on 05/18/2018.  Patient was found to be hemodynamically stable and completely asymptomatic, without evidence to suggest active bleed at this time. As described above, the patient's case was discussed with the on-call GI provider, Dr. Walden Field, who recommended that the patient follow-up in outpatient GI clinic on 06/16/2018, as currently scheduled, for pursuit of colonoscopy on an outpatient basis.  Additionally, the patient reports that he is completely abstain from alcohol following discharge from aforementioned recent hospitalization.   Plan: Continue outpatient iron and folic acid supplementation.  Continue outpatient Protonix. repeat CBC in the morning.  Counseled the patient on the importance of continued abstinence from NSAIDs. Counseled patient on importance of maintaining existing outpatient GI appointment for colonoscopy, which is scheduled to occur on 06/16/18, as above. SCD's.      #) Type 2 diabetes mellitus: On metformin as an outpatient, in the absence of any requirement of exogenous insulin administration.  Presenting blood sugar per presenting CMP noted to be 136.  Plan: Hold metformin during this hospitalization.  Have ordered Accu-Cheks q. before meals and at bedtime with sliding scale insulin.     #) Tobacco abuse: The patient reports that he is a current smoker, having smoked approximately half pack per day over the preceding 30 years.  However, he reports that recent  efforts geared towards ultimate complete abstinence from tobacco use has resulted in decrease in daily smoking down to 2 to 3 cigarettes/day.  He politely refuses offer for nicotine patch during this hospitalization.  Plan: Counseled the patient on the importance of complete smoking discontinuation.    DVT prophylaxis: scd's Code Status: full Family Communication: (none) Disposition Plan:  Per Rounding  Team Consults called: Walden Field, NP (GI)  Admission status: Observation, med telemetry.    PLEASE NOTE THAT DRAGON DICTATION SOFTWARE WAS USED IN THE CONSTRUCTION OF THIS NOTE.   [facility] Pager [phone] From 3PM- 11PM.   Otherwise, please contact night-coverage  [email or web address] [facility]  05/27/2018, 4:29 PM

## 2018-05-27 NOTE — Progress Notes (Signed)
CRITICAL VALUE ALERT  Critical Value:  Hemoglobin 6.8  Date & Time Notied:  05/27/2018 2319  Provider Notified: x. blount  Orders Received/Actions taken: awaiting orders

## 2018-05-27 NOTE — Telephone Encounter (Signed)
Received a phone call from Murray Hodgkins, NP at [facility]. States the patient was d/c 3/31 from the hospital with a hgb 7.9 and since then, in the past 10 days, has had a "drastic" drop in hgb to 7.1. She is requesting urgent colonoscopy. I explained to her the time it takes to have an outpatient colonoscopy scheduled. I recommended if she is that concerned about the patient's hgb she should refer him to the ER where he can be evaluated, receive PRBCs as needed; he would need PRBC resuscitation prior to endoscopic evaluation. She verbalized understanding.

## 2018-05-28 DIAGNOSIS — I10 Essential (primary) hypertension: Secondary | ICD-10-CM | POA: Diagnosis not present

## 2018-05-28 DIAGNOSIS — E876 Hypokalemia: Secondary | ICD-10-CM | POA: Diagnosis not present

## 2018-05-28 LAB — COMPREHENSIVE METABOLIC PANEL
ALT: 25 U/L (ref 0–44)
AST: 40 U/L (ref 15–41)
Albumin: 2.1 g/dL — ABNORMAL LOW (ref 3.5–5.0)
Alkaline Phosphatase: 101 U/L (ref 38–126)
Anion gap: 8 (ref 5–15)
BUN: 12 mg/dL (ref 8–23)
CO2: 17 mmol/L — ABNORMAL LOW (ref 22–32)
Calcium: 5.6 mg/dL — CL (ref 8.9–10.3)
Chloride: 113 mmol/L — ABNORMAL HIGH (ref 98–111)
Creatinine, Ser: 0.78 mg/dL (ref 0.61–1.24)
GFR calc Af Amer: >60 mL/min (ref >60)
GFR calc non Af Amer: >60 mL/min (ref >60)
Glucose, Bld: 129 mg/dL — ABNORMAL HIGH (ref 70–99)
Potassium: 3.8 mmol/L (ref 3.5–5.1)
Sodium: 138 mmol/L (ref 135–145)
Total Bilirubin: 1.3 mg/dL — ABNORMAL HIGH (ref 0.3–1.2)
Total Protein: 5.2 g/dL — ABNORMAL LOW (ref 6.5–8.1)

## 2018-05-28 LAB — PROTIME-INR
INR: 1.4 — ABNORMAL HIGH (ref 0.8–1.2)
Prothrombin Time: 17.3 seconds — ABNORMAL HIGH (ref 11.4–15.2)

## 2018-05-28 LAB — GLUCOSE, CAPILLARY
Glucose-Capillary: 96 mg/dL (ref 70–99)
Glucose-Capillary: 87 mg/dL (ref 70–99)
Glucose-Capillary: 97 mg/dL (ref 70–99)
Glucose-Capillary: 141 mg/dL — ABNORMAL HIGH (ref 70–99)

## 2018-05-28 LAB — MAGNESIUM: Magnesium: 1.6 mg/dL — ABNORMAL LOW (ref 1.7–2.4)

## 2018-05-28 LAB — PHOSPHORUS: Phosphorus: 3 mg/dL (ref 2.5–4.6)

## 2018-05-28 LAB — CBC
HCT: 24.3 % — ABNORMAL LOW (ref 39.0–52.0)
Hemoglobin: 8.1 g/dL — ABNORMAL LOW (ref 13.0–17.0)
MCH: 31.3 pg (ref 26.0–34.0)
MCHC: 33.3 g/dL (ref 30.0–36.0)
MCV: 93.8 fL (ref 80.0–100.0)
Platelets: 292 10*3/uL (ref 150–400)
RBC: 2.59 MIL/uL — ABNORMAL LOW (ref 4.22–5.81)
RDW: 15.6 % — ABNORMAL HIGH (ref 11.5–15.5)
WBC: 7.1 10*3/uL (ref 4.0–10.5)
nRBC: 0 % (ref 0.0–0.2)

## 2018-05-28 LAB — PREPARE RBC (CROSSMATCH)

## 2018-05-28 MED ORDER — CALCIUM GLUCONATE-NACL 1-0.675 GM/50ML-% IV SOLN
1.0000 g | INTRAVENOUS | Status: AC
  Administered 2018-05-28 (×2): 1000 mg via INTRAVENOUS
  Filled 2018-05-28: qty 50

## 2018-05-28 MED ORDER — CALCIUM GLUCONATE-NACL 1-0.675 GM/50ML-% IV SOLN
1.0000 g | INTRAVENOUS | Status: DC
  Filled 2018-05-28: qty 50

## 2018-05-28 MED ORDER — CALCIUM GLUCONATE-NACL 2-0.675 GM/100ML-% IV SOLN: 2.0000 g | Freq: Once | INTRAVENOUS | Status: DC

## 2018-05-28 MED ORDER — ONDANSETRON HCL 4 MG/2ML IJ SOLN: 4.0000 mg | Freq: Four times a day (QID) | INTRAMUSCULAR | Status: DC | PRN

## 2018-05-28 MED ORDER — CALCIUM CARBONATE ANTACID 500 MG PO CHEW
2.0000 | CHEWABLE_TABLET | Freq: Two times a day (BID) | ORAL | Status: DC
  Administered 2018-05-28 – 2018-05-31 (×7): 400 mg via ORAL
  Filled 2018-05-28 (×7): qty 2

## 2018-05-28 MED ORDER — MAGNESIUM SULFATE 2 GM/50ML IV SOLN
2.0000 g | Freq: Once | INTRAVENOUS | Status: AC
  Administered 2018-05-28: 2 g via INTRAVENOUS
  Filled 2018-05-28: qty 50

## 2018-05-28 MED ORDER — CALCIUM GLUCONATE-NACL 1-0.675 GM/50ML-% IV SOLN
1.0000 g | INTRAVENOUS | Status: AC
  Administered 2018-05-28 (×2): 1000 mg via INTRAVENOUS
  Filled 2018-05-28 (×2): qty 50

## 2018-05-28 NOTE — Clinical Social Work Note (Signed)
[employer] Medicare auth started.   Patient's son, [name], was notified of patient's performance in PT evaluation and that this could impact authorization. Patient's son indicated that at baseline patient lives alone. Son indicated that he has spoke with his father and advised him that he could come to his home until he felt he could manage on his own if he desired to do so.   Doug at [facility] aware of possible discharge and that Josem Kaufmann was started.    Jerimey Burridge, Clydene Pugh, LCSW

## 2018-05-28 NOTE — Evaluation (Signed)
Physical Therapy Evaluation Patient Details Name: Winston Misner MRN: [id number] DOB: January 20, 1951 Today's Date: 05/28/2018   History of Present Illness  The patient was recently hospitalized at [facility] from 05/13/2018 to 05/18/2018 for acute upper gastrointestinal bleed after presenting with complaint of dizziness, with laboratory evaluation notable for decline hemoglobin, and heme positive stool.  He underwent EGD on 05/17/2018, which demonstrated erosive gastropathy as well as duodenal erosion believed to be secondary to NSAID use, but in the absence of any evidence of active bleeding.  Over the course of his hospitalization, he required a total transfusion of 2 units PRBC, with lowest hemoglobin value over that course noted to be 6.6.  On 05/18/2018, the patient was discharged to [facility] with discharge hemoglobin value of 7.8.  He was discharged on Protonix, instructions to abstain from any NSAIDs, and has follow-up scheduled with Roseanne Kaufman, NP working with [facility] on 06/16/18 in order to arrange for colonoscopy to occur as an outpatient.  The patient confirms that he has had absolutely no NSAIDs following discharge from the hospital.  Due to hemoglobin dropping the SNF sent pt to ER.    Clinical Impression  Mr. Eicher lives alone.  He has improved in his mobility greatly since his recent discharge to the hospital.  Due to pt living alone he will benefit from returning to the SNF for a short amount of time to make sure that his activity tolerance is high enough to be safe living I.     Follow Up Recommendations SNF;Home health PT    Equipment Recommendations  Rolling walker with 5" wheels    Recommendations for Other Services   OT    Precautions / Restrictions Restrictions Weight Bearing Restrictions: No      Mobility  Bed Mobility Overal bed mobility: Modified Independent                Transfers Overall transfer level: Modified  independent     Sit to Stand: Min assist            Ambulation/Gait Ambulation/Gait assistance: Supervision Gait Distance (Feet): 100 Feet Assistive device: Rolling walker (2 wheeled)     Gait velocity interpretation: >4.37 ft/sec, indicative of normal walking speed             Pertinent Vitals/Pain Pain Assessment: No/denies pain    Home Living Family/patient expects to be discharged to:: Skilled nursing facility Living Arrangements: Alone Available Help at Discharge: Available PRN/intermittently Type of Home: House Home Access: Stairs to enter   [employer] of Steps: 3 Home Layout: Able to live on main level with bedroom/bathroom;Two level Home Equipment: Walker - 2 wheels      Prior Function Level of Independence: Independent with assistive device(s)               Hand Dominance   Dominant Hand: Right    Extremity/Trunk Assessment        Lower Extremity Assessment Lower Extremity Assessment: Overall WFL for tasks assessed       Communication   Communication: No difficulties  Cognition Arousal/Alertness: Awake/alert Behavior During Therapy: WFL for tasks assessed/performed Overall Cognitive Status: No family/caregiver present to determine baseline cognitive functioning                                 General Comments: Pt appears to be somewhat impulsive  Assessment/Plan    PT Assessment All further PT needs can be met in the next venue of care  PT Problem List Decreased activity tolerance       PT Treatment Interventions      PT Goals (Current goals can be found in the Care Plan section)  Acute Rehab PT Goals Patient Stated Goal: To get home to be able to do his gardening  PT Goal Formulation: With patient Time For Goal Achievement: 06/04/18 Potential to Achieve Goals: Good            M-PAC PT "6 Clicks" Mobility  Outcome Measure Help needed turning from your back to your side  while in a flat bed without using bedrails?: None Help needed moving from lying on your back to sitting on the side of a flat bed without using bedrails?: None Help needed moving to and from a bed to a chair (including a wheelchair)?: None Help needed standing up from a chair using your arms (e.g., wheelchair or bedside chair)?: None Help needed to walk in hospital room?: None Help needed climbing 3-5 steps with a railing? : A Little 6 Click Score: 23    End of Session Equipment Utilized During Treatment: Gait belt Activity Tolerance: Patient tolerated treatment well Patient left: in chair;with call bell/phone within reach;with nursing/sitter in room Nurse Communication: Mobility status PT Visit Diagnosis: Unsteadiness on feet (R26.81)    Time: 1282-0813 PT Time Calculation (min) (ACUTE ONLY): 35 min   Charges:   PT Evaluation $PT Eval Low Complexity: 1 Low           Rayetta Humphrey, PT CLT [phone] 05/28/2018, 2:24 PM

## 2018-05-28 NOTE — Progress Notes (Signed)
Second unit of ordered PRBC not given at this time due to new blood protocol. H/H to be drawn at 0700 will report to oncoming nurse.

## 2018-05-28 NOTE — Progress Notes (Signed)
PROGRESS NOTE  Darren Howard WIO:[id number] DOB: 1950-02-22 DOA: 05/27/2018 PCP: [name], MD  Brief History:  68 year old male with a history of GI bleed secondary to NSAIDs, hypertension, diabetes mellitus type 2, hyperlipidemia, alcohol abuse in remission, tobacco abuse in remission presenting with low hemoglobin on routine blood work.  The patient was recently mated to the hospital from 05/13/2018 through 05/18/2018 for an upper GI bleed.  The patient had EGD on 05/17/2018 which revealed multiple erosions in the gastric antrum and duodenal erosions.  This was felt to be due to NSAIDs.  During that hospitalization, the patient was transfused 2 units PRBC and discharged with PPI.  He was discharged to the [facility] in [place].  Since his discharge, the patient denies any further hematochezia, melena, fevers, chills, chest pain, nausea, vomiting, diarrhea.  Apparently, the patient had a routine CBC done on 05/25/2018 or 05/26/2018 which revealed his hemoglobin to be 7.1.  As result, the patient was transferred to emergency department for further evaluation.  In the emergency department, the patient was noted to have hemoglobin of 7.7 which was essentially unchanged from his hemoglobin on the day of discharge (7.8) on 05/17/1928.  EDP spoke with GI who did not feel that the patient needed to be admitted for further GI work-up.  The patient has a follow-up appointment with GI as an outpatient on 06/16/2018.  However, because the patient had multiple electrolyte abnormalities, the patient was admitted for further treatment and evaluation.  The patient was noted to have hypomagnesemia, hypocalcemia, and hypokalemia.  These were repleted aggressively.  Assessment/Plan: Hypocalcemia -Patient presented with a corrected calcium 6.8 -Replete aggressively -Intact PTH -25 vitamin D - check phosphorus--3.0 -Check magnesium--1.1  Hypomagnesemia -Continue to give IV magnesium -Start p.o. magnesium  oxide  Hypokalemia -Replete -In part due to hypomagnesemia  Anemia of chronic disease -05/13/2018 iron saturation 86%, ferritin 1005, E26 834 -1/96/22 folic acid 2.4  Diabetes mellitus type 2, controlled -04/23/2018 hemoglobin A1c 6.9 -NovoLog sliding scale -holding metformin  Erosive gastropathy -continue PPI  Essential hypertension -Continue carvedilol  Hyperlipidemia -Continue statin  Alcohol abuse in remission -Continue thiamine  Tobacco abuse -Patient states that he quit smoking 3 weeks prior to this admission   Total time spent 35 minutes.  Greater than 50% spent face to face counseling and coordinating care.    Disposition Plan:   SNF 4/11 or 4/12 Family Communication:  No Family at bedside  Consultants:  none  Code Status:  FULL  DVT Prophylaxis:  SCDs   Procedures: As Listed in Progress Note Above  Antibiotics: None       Subjective: Patient denies fevers, chills, headache, chest pain, dyspnea, nausea, vomiting, diarrhea, abdominal pain, dysuria, hematuria, hematochezia, and melena. Pt denies any facial or extremity weakness or clonus or dysarthria.  No visual disturbance.    Objective: Vitals:   05/28/18 0200 05/28/18 0227 05/28/18 0553 05/28/18 0554  BP: 103/68 109/63 136/74   Pulse: 70 69 73   Resp: 17 17 17    Temp: 99 F (37.2 C) 98.9 F (37.2 C) 99 F (37.2 C)   TempSrc: Oral Oral Oral   SpO2: 100% 100% 100%   Weight:    103.1 kg  Height:        Intake/Output Summary (Last 24 hours) at 05/28/2018 1618 Last data filed at 05/28/2018 1320 Gross per 24 hour  Intake 2135.7 ml  Output 100 ml  Net 2035.7 ml   Weight  change:  Exam:   General:  Pt is alert, follows commands appropriately, not in acute distress  HEENT: No icterus, No thrush, No neck mass, [place]/AT  Cardiovascular: RRR, S1/S2, no rubs, no gallops  Respiratory: CTA bilaterally, no wheezing, no crackles, no rhonchi  Abdomen: Soft/+BS, non tender, non distended,  no guarding  Extremities: No edema, No lymphangitis, No petechiae, No rashes, no synovitis   Data Reviewed: I have personally reviewed following labs and imaging studies Basic Metabolic Panel: Recent Labs  Lab 05/27/18 1353 05/27/18 2231 05/28/18 0707 05/28/18 1303  NA 139 139 138  --   K 3.1* 3.7 3.8  --   CL 113* 113* 113*  --   CO2 18* 16* 17*  --   GLUCOSE 136* 135* 129*  --   BUN 12 13 12   --   CREATININE 0.80 0.83 0.78  --   CALCIUM 5.4* 5.4* 5.6*  --   MG 1.1* 1.0* 1.6*  --   PHOS  --   --   --  3.0   Liver Function Tests: Recent Labs  Lab 05/27/18 1353 05/27/18 2231 05/28/18 0707  AST 44* 44* 40  ALT 26 24 25   ALKPHOS 110 92 101  BILITOT 0.6 0.4 1.3*  PROT 5.1* 4.9* 5.2*  ALBUMIN 2.3* 2.0* 2.1*   No results for input(s): LIPASE, AMYLASE in the last 168 hours. No results for input(s): AMMONIA in the last 168 hours. Coagulation Profile: Recent Labs  Lab 05/28/18 0707  INR 1.4*   CBC: Recent Labs  Lab 05/27/18 1353 05/27/18 2231 05/28/18 0707  WBC 8.7  --  7.1  NEUTROABS 6.5  --   --   HGB 7.7* 6.8* 8.1*  HCT 23.6*  --  24.3*  MCV 94.8  --  93.8  PLT 347  --  292   Cardiac Enzymes: No results for input(s): CKTOTAL, CKMB, CKMBINDEX, TROPONINI in the last 168 hours. BNP: Invalid input(s): POCBNP CBG: Recent Labs  Lab 05/28/18 0739 05/28/18 1144  GLUCAP 96 87   HbA1C: No results for input(s): HGBA1C in the last 72 hours. Urine analysis:    Component Value Date/Time   COLORURINE AMBER (A) 05/27/2018 2127   APPEARANCEUR HAZY (A) 05/27/2018 2127   LABSPEC 1.025 05/27/2018 2127   PHURINE 5.0 05/27/2018 2127   GLUCOSEU NEGATIVE 05/27/2018 2127   HGBUR NEGATIVE 05/27/2018 2127   BILIRUBINUR NEGATIVE 05/27/2018 2127   KETONESUR NEGATIVE 05/27/2018 2127   PROTEINUR NEGATIVE 05/27/2018 2127   UROBILINOGEN 0.2 06/14/2008 1930   NITRITE NEGATIVE 05/27/2018 2127   LEUKOCYTESUR NEGATIVE 05/27/2018 2127   Sepsis  Labs: @LABRCNTIP (procalcitonin:4,lacticidven:4) ) Recent Results (from the past 240 hour(s))  MRSA PCR Screening     Status: None   Collection Time: 05/27/18  8:30 PM  Result Value Ref Range Status   MRSA by PCR NEGATIVE NEGATIVE Final    Comment:        The GeneXpert MRSA Assay (FDA approved for NASAL specimens only), is one component of a comprehensive MRSA colonization surveillance program. It is not intended to diagnose MRSA infection nor to guide or monitor treatment for MRSA infections. Performed at [facility], [address], [place], [place] [address]      Scheduled Meds:  atorvastatin  10 mg Oral QHS   carvedilol  25 mg Oral BID WC   ferrous sulfate  325 mg Oral TID   folic acid  1 mg Oral Daily   insulin aspart  0-9 Units Subcutaneous TID WC  pantoprazole  40 mg Oral Daily   thiamine  100 mg Oral Daily   Continuous Infusions:  Procedures/Studies: Dg Chest 2 View  Result Date: 05/13/2018 CLINICAL DATA:  Cough EXAM: CHEST - 2 VIEW COMPARISON:  07/17/2006 FINDINGS: The heart size and mediastinal contours are within normal limits. Both lungs are clear. The visualized skeletal structures are unremarkable. IMPRESSION: No active cardiopulmonary disease. Electronically Signed   By: Franchot Gallo M.D.   On: 05/13/2018 14:22   Ct Head Wo Contrast  Result Date: 05/13/2018 CLINICAL DATA:  Weakness and dizziness for several weeks EXAM: CT HEAD WITHOUT CONTRAST TECHNIQUE: Contiguous axial images were obtained from the base of the skull through the vertex without intravenous contrast. COMPARISON:  None. FINDINGS: Brain: Mild atrophic changes and chronic white matter ischemic change is identified. No findings to suggest acute hemorrhage, acute infarction or space-occupying mass lesion seen. Few scattered lacunar infarcts are noted in the basal ganglia and thalami bilaterally. Vascular: No hyperdense vessel or unexpected calcification. Skull: Normal. Negative for fracture  or focal lesion. Sinuses/Orbits: No acute finding. Other: None. IMPRESSION: Chronic atrophic and ischemic changes without acute abnormality. Electronically Signed   By: Inez Catalina M.D.   On: 05/13/2018 14:18   Mr Brain Wo Contrast  Result Date: 05/13/2018 CLINICAL DATA:  Three-week history of dizziness and gait disturbance. EXAM: MRI HEAD WITHOUT CONTRAST TECHNIQUE: Multiplanar, multiecho pulse sequences of the brain and surrounding structures were obtained without intravenous contrast. COMPARISON:  Head CT 05/13/2018 FINDINGS: Brain: Diffusion imaging does not show any acute or subacute infarction. The brainstem and cerebellum are normal. There is an old lacunar infarction in the right thalamus and in both basal ganglia and radiating white matter tracts. No large vessel territory infarction. No mass lesion, hemorrhage, hydrocephalus or extra-axial collection. Vascular: Major vessels at the base of the brain show flow. Skull and upper cervical spine: Negative Sinuses/Orbits: Clear/normal Other: None IMPRESSION: Motion degraded exam. No acute finding. 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 Electronically Signed   By: Nelson Chimes M.D.   On: 05/13/2018 15:20    Orson Eva, DO  [facility] Pager [phone]  If 7PM-7AM, please contact night-coverage [email or web address] Password [email or web address] 05/28/2018, 4:18 PM   LOS: 0 days

## 2018-05-28 NOTE — NC FL2 (Signed)
[place] LEVEL OF CARE SCREENING TOOL     IDENTIFICATION  Patient Name: Darren Howard Birthdate: 07-26-1950 Sex: male Admission Date (Current Location): 05/27/2018  [facility] and [place] Number:  [employer] and Address:  [facility] [address], [place]      Provider Number: [id number]  Attending Physician Name and Address:  Orson Eva, MD  Relative Name and Phone Number:  Loghan Kurtzman [phone]    Current Level of Care: Other (Comment)(observation) Recommended Level of Care: [place] Prior Approval Number:    Date Approved/Denied:   PASRR Number: [id number]  Discharge Plan: SNF    Current Diagnoses: Patient Active Problem List   Diagnosis Date Noted  . Hypokalemia 05/27/2018  . Hypomagnesemia 05/27/2018  . Hypocalcemia 05/27/2018  . Dizziness 05/13/2018  . Epistaxis 02/13/2014  . Hypertension 02/13/2014  . Anemia 02/13/2014  . DM2 (diabetes mellitus, type 2) ([place]) 02/13/2014  . Tachycardia 02/13/2014  . Hypertensive urgency     Orientation RESPIRATION BLADDER Height & Weight     Self, Time, Situation, Place  Normal Continent Weight: 227 lb 4.7 oz (103.1 kg) Height:  6\' 1"  (185.4 cm)  BEHAVIORAL SYMPTOMS/MOOD NEUROLOGICAL BOWEL NUTRITION STATUS      Continent Diet(carb modified )  AMBULATORY STATUS COMMUNICATION OF NEEDS Skin   Supervision Verbally Normal                       Personal Care Assistance Level of Assistance  Bathing, Feeding, Dressing Bathing Assistance: Limited assistance Feeding assistance: Independent Dressing Assistance: Limited assistance     Functional Limitations Info  Sight, Hearing, Speech Sight Info: Adequate Hearing Info: Adequate Speech Info: Adequate    SPECIAL CARE FACTORS FREQUENCY  PT (By licensed PT)     PT Frequency: 5x/week              Contractures Contractures Info: Not present    Additional Factors Info  Code Status,  Allergies Code Status Info: full code Allergies Info: nka           Current Medications (05/28/2018):  This is the current hospital active medication list Current Facility-Administered Medications  Medication Dose Route Frequency Provider Last Rate Last Dose  . acetaminophen (TYLENOL) tablet 650 mg  650 mg Oral Q6H PRN Howerter, Justin B, DO   650 mg at 05/27/18 2056   Or  . acetaminophen (TYLENOL) suppository 650 mg  650 mg Rectal Q6H PRN Howerter, Justin B, DO      . atorvastatin (LIPITOR) tablet 10 mg  10 mg Oral QHS Howerter, Justin B, DO   10 mg at 05/27/18 2053  . carvedilol (COREG) tablet 25 mg  25 mg Oral BID WC Howerter, Justin B, DO   25 mg at 05/28/18 0956  . ferrous sulfate tablet 325 mg  325 mg Oral TID Howerter, Justin B, DO   325 mg at 05/28/18 1509  . folic acid (FOLVITE) tablet 1 mg  1 mg Oral Daily Howerter, Justin B, DO   1 mg at 05/28/18 0956  . insulin aspart (novoLOG) injection 0-9 Units  0-9 Units Subcutaneous TID WC Howerter, Justin B, DO      . ondansetron (ZOFRAN) injection 4 mg  4 mg Intravenous Q6H PRN Tat, David, MD      . pantoprazole (PROTONIX) EC tablet 40 mg  40 mg Oral Daily Howerter, Justin B, DO   40 mg at 05/28/18 0956  . thiamine (VITAMIN B-1)  tablet 100 mg  100 mg Oral Daily Howerter, Justin B, DO   100 mg at 05/28/18 9518     Discharge Medications: Please see discharge summary for a list of discharge medications.  Relevant Imaging Results:  Relevant Lab Results:   Additional Information ss# [id number]. Auth approval number is [id number], start date of 05/29/2018. Auth for 3 days. Dollene Cleveland will receive ongoing updates. Her fax number is [phone]  Ihor Gully, LCSW

## 2018-05-28 NOTE — Progress Notes (Signed)
CRITICAL VALUE ALERT  Critical Value:  Calcium 5.6   Date & Time Notied:  Dr. Carles Collet [phone]  Provider Notified: Dr. Carles Collet   Orders Received/Actions taken:awaiting callback/orders

## 2018-05-28 NOTE — Plan of Care (Signed)
Patient resistant to learning and education.  Patient has been confused/forgetful this shift.

## 2018-05-29 DIAGNOSIS — Z809 Family history of malignant neoplasm, unspecified: Secondary | ICD-10-CM | POA: Diagnosis not present

## 2018-05-29 DIAGNOSIS — K264 Chronic or unspecified duodenal ulcer with hemorrhage: Secondary | ICD-10-CM | POA: Diagnosis present

## 2018-05-29 DIAGNOSIS — F1011 Alcohol abuse, in remission: Secondary | ICD-10-CM | POA: Diagnosis present

## 2018-05-29 DIAGNOSIS — E876 Hypokalemia: Secondary | ICD-10-CM | POA: Diagnosis present

## 2018-05-29 DIAGNOSIS — I10 Essential (primary) hypertension: Secondary | ICD-10-CM | POA: Diagnosis present

## 2018-05-29 DIAGNOSIS — F17210 Nicotine dependence, cigarettes, uncomplicated: Secondary | ICD-10-CM | POA: Diagnosis present

## 2018-05-29 DIAGNOSIS — K921 Melena: Secondary | ICD-10-CM | POA: Diagnosis not present

## 2018-05-29 DIAGNOSIS — Z7984 Long term (current) use of oral hypoglycemic drugs: Secondary | ICD-10-CM | POA: Diagnosis not present

## 2018-05-29 DIAGNOSIS — K319 Disease of stomach and duodenum, unspecified: Secondary | ICD-10-CM | POA: Diagnosis present

## 2018-05-29 DIAGNOSIS — K635 Polyp of colon: Secondary | ICD-10-CM | POA: Diagnosis present

## 2018-05-29 DIAGNOSIS — E119 Type 2 diabetes mellitus without complications: Secondary | ICD-10-CM | POA: Diagnosis present

## 2018-05-29 DIAGNOSIS — K644 Residual hemorrhoidal skin tags: Secondary | ICD-10-CM | POA: Diagnosis present

## 2018-05-29 DIAGNOSIS — K648 Other hemorrhoids: Secondary | ICD-10-CM | POA: Diagnosis present

## 2018-05-29 DIAGNOSIS — E785 Hyperlipidemia, unspecified: Secondary | ICD-10-CM | POA: Diagnosis present

## 2018-05-29 DIAGNOSIS — D638 Anemia in other chronic diseases classified elsewhere: Secondary | ICD-10-CM | POA: Diagnosis present

## 2018-05-29 DIAGNOSIS — Z8249 Family history of ischemic heart disease and other diseases of the circulatory system: Secondary | ICD-10-CM | POA: Diagnosis not present

## 2018-05-29 LAB — COMPREHENSIVE METABOLIC PANEL
ALT: 23 U/L (ref 0–44)
AST: 43 U/L — ABNORMAL HIGH (ref 15–41)
Albumin: 2 g/dL — ABNORMAL LOW (ref 3.5–5.0)
Alkaline Phosphatase: 98 U/L (ref 38–126)
Anion gap: 8 (ref 5–15)
BUN: 12 mg/dL (ref 8–23)
CO2: 15 mmol/L — ABNORMAL LOW (ref 22–32)
Calcium: 6.5 mg/dL — ABNORMAL LOW (ref 8.9–10.3)
Chloride: 116 mmol/L — ABNORMAL HIGH (ref 98–111)
Creatinine, Ser: 0.82 mg/dL (ref 0.61–1.24)
GFR calc Af Amer: >60 mL/min (ref >60)
GFR calc non Af Amer: >60 mL/min (ref >60)
Glucose, Bld: 119 mg/dL — ABNORMAL HIGH (ref 70–99)
Potassium: 4 mmol/L (ref 3.5–5.1)
Sodium: 139 mmol/L (ref 135–145)
Total Bilirubin: 0.6 mg/dL (ref 0.3–1.2)
Total Protein: 5 g/dL — ABNORMAL LOW (ref 6.5–8.1)

## 2018-05-29 LAB — GLUCOSE, CAPILLARY
Glucose-Capillary: 81 mg/dL (ref 70–99)
Glucose-Capillary: 87 mg/dL (ref 70–99)
Glucose-Capillary: 121 mg/dL — ABNORMAL HIGH (ref 70–99)
Glucose-Capillary: 106 mg/dL — ABNORMAL HIGH (ref 70–99)
Glucose-Capillary: 110 mg/dL — ABNORMAL HIGH (ref 70–99)

## 2018-05-29 LAB — VITAMIN D 25 HYDROXY (VIT D DEFICIENCY, FRACTURES): Vit D, 25-Hydroxy: 7.7 ng/mL — ABNORMAL LOW (ref 30.0–100.0)

## 2018-05-29 LAB — PHOSPHORUS: Phosphorus: 3.3 mg/dL (ref 2.5–4.6)

## 2018-05-29 LAB — CALCIUM, IONIZED: Calcium, Ionized, Serum: 3.5 mg/dL — ABNORMAL LOW (ref 4.5–5.6)

## 2018-05-29 LAB — PARATHYROID HORMONE, INTACT (NO CA): PTH: 47 pg/mL (ref 15–65)

## 2018-05-29 LAB — MAGNESIUM: Magnesium: 1.8 mg/dL (ref 1.7–2.4)

## 2018-05-29 MED ORDER — CALCIUM GLUCONATE-NACL 1-0.675 GM/50ML-% IV SOLN
1.0000 g | INTRAVENOUS | Status: AC
  Administered 2018-05-29 (×2): 1000 mg via INTRAVENOUS
  Filled 2018-05-29: qty 50

## 2018-05-29 MED ORDER — MAGNESIUM SULFATE 2 GM/50ML IV SOLN
2.0000 g | Freq: Once | INTRAVENOUS | Status: AC
  Administered 2018-05-29: 10:00:00 2 g via INTRAVENOUS
  Filled 2018-05-29: qty 50

## 2018-05-29 MED ORDER — CALCIUM GLUCONATE-NACL 2-0.675 GM/100ML-% IV SOLN: 2.0000 g | Freq: Once | INTRAVENOUS | Status: DC

## 2018-05-29 MED ORDER — VITAMIN D (ERGOCALCIFEROL) 1.25 MG (50000 UNIT) PO CAPS
50000.0000 [IU] | ORAL_CAPSULE | ORAL | Status: DC
  Administered 2018-05-29: 10:00:00 50000 [IU] via ORAL
  Filled 2018-05-29 (×2): qty 1

## 2018-05-29 NOTE — Progress Notes (Signed)
PROGRESS NOTE  Darren Howard TZG:[id number] DOB: 1951-02-14 DOA: 05/27/2018 PCP: [name], MD  Brief History:  68 year old male with a history of GI bleed secondary to NSAIDs, hypertension, diabetes mellitus type 2, hyperlipidemia, alcohol abuse in remission, tobacco abuse in remission presenting with low hemoglobin on routine blood work.  The patient was recently mated to the hospital from 05/13/2018 through 05/18/2018 for an upper GI bleed.  The patient had EGD on 05/17/2018 which revealed multiple erosions in the gastric antrum and duodenal erosions.  This was felt to be due to NSAIDs.  During that hospitalization, the patient was transfused 2 units PRBC and discharged with PPI.  He was discharged to the [facility] in [place].  Since his discharge, the patient denies any further hematochezia, melena, fevers, chills, chest pain, nausea, vomiting, diarrhea.  Apparently, the patient had a routine CBC done on 05/25/2018 or 05/26/2018 which revealed his hemoglobin to be 7.1.  As result, the patient was transferred to emergency department for further evaluation.  In the emergency department, the patient was noted to have hemoglobin of 7.7 which was essentially unchanged from his hemoglobin on the day of discharge (7.8) on 05/17/1928.  EDP spoke with GI who did not feel that the patient needed to be admitted for further GI work-up.  The patient has a follow-up appointment with GI as an outpatient on 06/16/2018.  However, because the patient had multiple electrolyte abnormalities, the patient was admitted for further treatment and evaluation.  The patient was noted to have hypomagnesemia, hypocalcemia, and hypokalemia.  These were repleted aggressively.  Assessment/Plan: Hypocalcemia -Patient presented with a corrected calcium 6.8 -give additional 2 grams calcium gluconate -start po calcium carbonate -Intact PTH--47  -25 vitamin D--7.7 - check phosphorus--3.0 -Check  magnesium--1.1>>>1.8  Hypomagnesemia -give additional 2 g IV magnesium -Start p.o. magnesium oxide  Hypokalemia -Repleted -In part due to hypomagnesemia  Anemia of chronic disease -05/13/2018 iron saturation 86%, ferritin 1005, P59 163 -8/46/65 folic acid 2.4 -continue po iron and folic acid  Diabetes mellitus type 2, controlled -04/23/2018 hemoglobin A1c 6.9 -NovoLog sliding scale -holding metformin  Erosive gastropathy -continue PPI  Essential hypertension -Continue carvedilol  Hyperlipidemia -Continue statin  Alcohol abuse in remission -Continue thiamine  Tobacco abuse -Patient states that he quit smoking 3 weeks prior to this admission   Total time spent 35 minutes.  Greater than 50% spent face to face counseling and coordinating care.    Disposition Plan:   SNF 4/12 if stable Family Communication:  No Family at bedside  Consultants:  none  Code Status:  FULL  DVT Prophylaxis:  SCDs   Procedures: As Listed in Progress Note Above  Antibiotics: None        Subjective: Patient denies fevers, chills, headache, chest pain, dyspnea,vomiting, diarrhea, abdominal pain, dysuria, hematuria, hematochezia, and melena. He complains of some nausea  Objective: Vitals:   05/28/18 0554 05/28/18 2202 05/29/18 0621 05/29/18 1300  BP:  122/82 (!) 146/77 139/74  Pulse:  75 69 83  Resp:  20 16   Temp:  98.4 F (36.9 C) 99 F (37.2 C) 98.9 F (37.2 C)  TempSrc:  Oral Oral Oral  SpO2:  100% 100% 100%  Weight: 103.1 kg  102.2 kg   Height:        Intake/Output Summary (Last 24 hours) at 05/29/2018 1545 Last data filed at 05/29/2018 0900 Gross per 24 hour  Intake 364.86 ml  Output 600 ml  Net -  235.14 ml   Weight change: 7.398 kg Exam:   General:  Pt is alert, follows commands appropriately, not in acute distress  HEENT: No icterus, No thrush, No neck mass, [place]/AT  Cardiovascular: RRR, S1/S2, no rubs, no  gallops  Respiratory: CTA bilaterally, no wheezing, no crackles, no rhonchi  Abdomen: Soft/+BS, non tender, non distended, no guarding  Extremities: No edema, No lymphangitis, No petechiae, No rashes, no synovitis   Data Reviewed: I have personally reviewed following labs and imaging studies Basic Metabolic Panel: Recent Labs  Lab 05/27/18 1353 05/27/18 2231 05/28/18 0707 05/28/18 1303 05/29/18 0615  NA 139 139 138  --  139  K 3.1* 3.7 3.8  --  4.0  CL 113* 113* 113*  --  116*  CO2 18* 16* 17*  --  15*  GLUCOSE 136* 135* 129*  --  119*  BUN 12 13 12   --  12  CREATININE 0.80 0.83 0.78  --  0.82  CALCIUM 5.4* 5.4* 5.6*  --  6.5*  MG 1.1* 1.0* 1.6*  --  1.8  PHOS  --   --   --  3.0 3.3   Liver Function Tests: Recent Labs  Lab 05/27/18 1353 05/27/18 2231 05/28/18 0707 05/29/18 0615  AST 44* 44* 40 43*  ALT 26 24 25 23   ALKPHOS 110 92 101 98  BILITOT 0.6 0.4 1.3* 0.6  PROT 5.1* 4.9* 5.2* 5.0*  ALBUMIN 2.3* 2.0* 2.1* 2.0*   No results for input(s): LIPASE, AMYLASE in the last 168 hours. No results for input(s): AMMONIA in the last 168 hours. Coagulation Profile: Recent Labs  Lab 05/28/18 0707  INR 1.4*   CBC: Recent Labs  Lab 05/27/18 1353 05/27/18 2231 05/28/18 0707  WBC 8.7  --  7.1  NEUTROABS 6.5  --   --   HGB 7.7* 6.8* 8.1*  HCT 23.6*  --  24.3*  MCV 94.8  --  93.8  PLT 347  --  292   Cardiac Enzymes: No results for input(s): CKTOTAL, CKMB, CKMBINDEX, TROPONINI in the last 168 hours. BNP: Invalid input(s): POCBNP CBG: Recent Labs  Lab 05/28/18 1144 05/28/18 1624 05/28/18 2158 05/29/18 0718 05/29/18 1105  GLUCAP 87 97 141* 81 87   HbA1C: No results for input(s): HGBA1C in the last 72 hours. Urine analysis:    Component Value Date/Time   COLORURINE AMBER (A) 05/27/2018 2127   APPEARANCEUR HAZY (A) 05/27/2018 2127   LABSPEC 1.025 05/27/2018 2127   PHURINE 5.0 05/27/2018 2127   GLUCOSEU NEGATIVE 05/27/2018 2127   HGBUR NEGATIVE  05/27/2018 2127   BILIRUBINUR NEGATIVE 05/27/2018 2127   KETONESUR NEGATIVE 05/27/2018 2127   PROTEINUR NEGATIVE 05/27/2018 2127   UROBILINOGEN 0.2 06/14/2008 1930   NITRITE NEGATIVE 05/27/2018 2127   LEUKOCYTESUR NEGATIVE 05/27/2018 2127   Sepsis Labs: @LABRCNTIP (procalcitonin:4,lacticidven:4) ) Recent Results (from the past 240 hour(s))  MRSA PCR Screening     Status: None   Collection Time: 05/27/18  8:30 PM  Result Value Ref Range Status   MRSA by PCR NEGATIVE NEGATIVE Final    Comment:        The GeneXpert MRSA Assay (FDA approved for NASAL specimens only), is one component of a comprehensive MRSA colonization surveillance program. It is not intended to diagnose MRSA infection nor to guide or monitor treatment for MRSA infections. Performed at [facility], [address] [place], [place] [address]      Scheduled Meds:  atorvastatin  10 mg Oral QHS   calcium carbonate  2 tablet Oral BID WC   carvedilol  25 mg Oral BID WC   ferrous sulfate  325 mg Oral TID   folic acid  1 mg Oral Daily   insulin aspart  0-9 Units Subcutaneous TID WC   pantoprazole  40 mg Oral Daily   thiamine  100 mg Oral Daily   Vitamin D (Ergocalciferol)  50,000 Units Oral Q7 days   Continuous Infusions:  Procedures/Studies: Dg Chest 2 View  Result Date: 05/13/2018 CLINICAL DATA:  Cough EXAM: CHEST - 2 VIEW COMPARISON:  07/17/2006 FINDINGS: The heart size and mediastinal contours are within normal limits. Both lungs are clear. The visualized skeletal structures are unremarkable. IMPRESSION: No active cardiopulmonary disease. Electronically Signed   By: Franchot Gallo M.D.   On: 05/13/2018 14:22   Ct Head Wo Contrast  Result Date: 05/13/2018 CLINICAL DATA:  Weakness and dizziness for several weeks EXAM: CT HEAD WITHOUT CONTRAST TECHNIQUE: Contiguous axial images were obtained from the base of the skull through the vertex without intravenous contrast. COMPARISON:  None. FINDINGS: Brain:  Mild atrophic changes and chronic white matter ischemic change is identified. No findings to suggest acute hemorrhage, acute infarction or space-occupying mass lesion seen. Few scattered lacunar infarcts are noted in the basal ganglia and thalami bilaterally. Vascular: No hyperdense vessel or unexpected calcification. Skull: Normal. Negative for fracture or focal lesion. Sinuses/Orbits: No acute finding. Other: None. IMPRESSION: Chronic atrophic and ischemic changes without acute abnormality. Electronically Signed   By: Inez Catalina M.D.   On: 05/13/2018 14:18   Mr Brain Wo Contrast  Result Date: 05/13/2018 CLINICAL DATA:  Three-week history of dizziness and gait disturbance. EXAM: MRI HEAD WITHOUT CONTRAST TECHNIQUE: Multiplanar, multiecho pulse sequences of the brain and surrounding structures were obtained without intravenous contrast. COMPARISON:  Head CT 05/13/2018 FINDINGS: Brain: Diffusion imaging does not show any acute or subacute infarction. The brainstem and cerebellum are normal. There is an old lacunar infarction in the right thalamus and in both basal ganglia and radiating white matter tracts. No large vessel territory infarction. No mass lesion, hemorrhage, hydrocephalus or extra-axial collection. Vascular: Major vessels at the base of the brain show flow. Skull and upper cervical spine: Negative Sinuses/Orbits: Clear/normal Other: None IMPRESSION: Motion degraded exam. No acute finding. 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 Electronically Signed   By: Nelson Chimes M.D.   On: 05/13/2018 15:20    Orson Eva, DO  [facility] Pager [phone]  If 7PM-7AM, please contact night-coverage [email or web address] Password [email or web address] 05/29/2018, 3:45 PM   LOS: 0 days

## 2018-05-30 DIAGNOSIS — K921 Melena: Secondary | ICD-10-CM

## 2018-05-30 LAB — COMPREHENSIVE METABOLIC PANEL
ALT: 27 U/L (ref 0–44)
AST: 49 U/L — ABNORMAL HIGH (ref 15–41)
Albumin: 2.1 g/dL — ABNORMAL LOW (ref 3.5–5.0)
Alkaline Phosphatase: 107 U/L (ref 38–126)
Anion gap: 8 (ref 5–15)
BUN: 12 mg/dL (ref 8–23)
CO2: 16 mmol/L — ABNORMAL LOW (ref 22–32)
Calcium: 7 mg/dL — ABNORMAL LOW (ref 8.9–10.3)
Chloride: 114 mmol/L — ABNORMAL HIGH (ref 98–111)
Creatinine, Ser: 0.82 mg/dL (ref 0.61–1.24)
GFR calc Af Amer: >60 mL/min (ref >60)
GFR calc non Af Amer: >60 mL/min (ref >60)
Glucose, Bld: 116 mg/dL — ABNORMAL HIGH (ref 70–99)
Potassium: 4 mmol/L (ref 3.5–5.1)
Sodium: 138 mmol/L (ref 135–145)
Total Bilirubin: 0.6 mg/dL (ref 0.3–1.2)
Total Protein: 5.1 g/dL — ABNORMAL LOW (ref 6.5–8.1)

## 2018-05-30 LAB — CBC
HCT: 26.3 % — ABNORMAL LOW (ref 39.0–52.0)
Hemoglobin: 8.4 g/dL — ABNORMAL LOW (ref 13.0–17.0)
MCH: 31.2 pg (ref 26.0–34.0)
MCHC: 31.9 g/dL (ref 30.0–36.0)
MCV: 97.8 fL (ref 80.0–100.0)
Platelets: 308 10*3/uL (ref 150–400)
RBC: 2.69 MIL/uL — ABNORMAL LOW (ref 4.22–5.81)
RDW: 15.9 % — ABNORMAL HIGH (ref 11.5–15.5)
WBC: 6.7 10*3/uL (ref 4.0–10.5)
nRBC: 0 % (ref 0.0–0.2)

## 2018-05-30 LAB — GLUCOSE, CAPILLARY
Glucose-Capillary: 84 mg/dL (ref 70–99)
Glucose-Capillary: 112 mg/dL — ABNORMAL HIGH (ref 70–99)
Glucose-Capillary: 104 mg/dL — ABNORMAL HIGH (ref 70–99)
Glucose-Capillary: 124 mg/dL — ABNORMAL HIGH (ref 70–99)

## 2018-05-30 LAB — PHOSPHORUS: Phosphorus: 3.6 mg/dL (ref 2.5–4.6)

## 2018-05-30 LAB — MAGNESIUM: Magnesium: 1.7 mg/dL (ref 1.7–2.4)

## 2018-05-30 MED ORDER — CALCIUM CARBONATE ANTACID 500 MG PO CHEW: 2.0000 | CHEWABLE_TABLET | Freq: Two times a day (BID) | ORAL | 1 refills | Status: DC

## 2018-05-30 MED ORDER — POLYETHYLENE GLYCOL 3350 17 G PO PACK
17.0000 g | PACK | Freq: Every day | ORAL | Status: DC
  Administered 2018-05-30 – 2018-06-01 (×3): 17 g via ORAL
  Filled 2018-05-30 (×3): qty 1

## 2018-05-30 MED ORDER — FERROUS SULFATE 325 (65 FE) MG PO TABS: 325.0000 mg | ORAL_TABLET | Freq: Two times a day (BID) | ORAL | 3 refills | Status: DC

## 2018-05-30 MED ORDER — FERROUS SULFATE 325 (65 FE) MG PO TABS
325.0000 mg | ORAL_TABLET | Freq: Every day | ORAL | Status: DC
  Administered 2018-05-31: 08:00:00 325 mg via ORAL
  Filled 2018-05-30: qty 1

## 2018-05-30 MED ORDER — SENNA 8.6 MG PO TABS
2.0000 | ORAL_TABLET | Freq: Every day | ORAL | Status: DC
  Administered 2018-05-30 – 2018-06-01 (×3): 17.2 mg via ORAL
  Filled 2018-05-30 (×3): qty 2

## 2018-05-30 MED ORDER — MAGNESIUM OXIDE 400 (241.3 MG) MG PO TABS
400.0000 mg | ORAL_TABLET | Freq: Every day | ORAL | Status: DC
  Administered 2018-05-30 – 2018-06-01 (×3): 400 mg via ORAL
  Filled 2018-05-30 (×3): qty 1

## 2018-05-30 MED ORDER — FERROUS SULFATE 325 (65 FE) MG PO TABS: 325.0000 mg | ORAL_TABLET | Freq: Every day | ORAL | 3 refills | Status: DC

## 2018-05-30 MED ORDER — AMLODIPINE BESYLATE 2.5 MG PO TABS: 10.0000 mg | ORAL_TABLET | Freq: Every day | ORAL | 1 refills | Status: DC

## 2018-05-30 NOTE — Progress Notes (Signed)
Visibile blood noted in pt bowel movement. Upon assessment external hemoroid bleeding. Pt does admit to straining for BM. Will report to oncoming shift for possible stool softener.

## 2018-05-30 NOTE — Progress Notes (Signed)
PROGRESS NOTE  Darren Howard VPX:[id number] DOB: 04-Aug-1950 DOA: 05/27/2018 PCP: [name], MD    Brief History: 68 year old male with a history of GI bleed secondary to NSAIDs, hypertension, diabetes mellitus type 2, hyperlipidemia, alcohol abuse in remission, tobacco abuse in remission presenting with low hemoglobin on routine blood work. The patient was recently mated to the hospital from 05/13/2018 through 05/18/2018 for an upper GI bleed. The patient had EGD on 05/17/2018 which revealed multiple erosions in the gastric antrum and duodenal erosions. This was felt to be due to NSAIDs. During that hospitalization, the patient was transfused 2 units PRBC and discharged with PPI. He was discharged to the [facility] in [place]. Since his discharge, the patient denies any further hematochezia, melena, fevers, chills, chest pain, nausea, vomiting, diarrhea. Apparently, the patient had a routine CBC done on 05/25/2018 or 05/26/2018 which revealed his hemoglobin to be 7.1. As result, the patient was transferred to emergency department for further evaluation. In the emergency department, the patient was noted to have hemoglobin of 7.7 which was essentially unchanged from his hemoglobin on the day of discharge (7.8)on 05/17/1928. EDP spoke with GI who did not feel that the patient needed to be admitted for further GI work-up. The patient has a follow-up appointment with GI as an outpatient on 06/16/2018. However, because the patient had multiple electrolyte abnormalities, the patient was admitted for further treatment and evaluation.The patient was noted to have hypomagnesemia, hypocalcemia, and hypokalemia. These were repleted aggressively. In early am 05/30/18, the patient had an episode of hematochezia.  As a result, his discharge was delayed to monitor his Hgb and clinical situation.  Assessment/Plan: Hypocalcemia -Patient presented with a corrected calcium 6.8>>8.6 -give nine  grams of calcium gluconate -continue po calcium carbonate -Intact PTH--47  -25 vitamin D--7.7 -check phosphorus--3.0 -Check magnesium--1.1>>>1.8  Hypomagnesemia -give additional 2 g IV magnesium -Start p.o. magnesium oxide  Hypokalemia -Repleted -In part due to hypomagnesemia  Hematochezia -4/12 am--had episode -delay discharge to monitor for continued bleeding and Hgb -start miralax and senna  Anemia of chronic disease -05/13/2018 iron saturation 86%, ferritin 1005, I62 703 -5/00/93 folic acid 2.4 -continue po iron and folic acid  Diabetes mellitus type 2, controlled -04/23/2018 hemoglobin A1c 6.9 -NovoLog sliding scale -holding metformin  Erosive gastropathy -continue PPI  Essential hypertension -Continue carvedilol  Hyperlipidemia -Continue statin  Alcohol abuse in remission -Continue thiamine  Tobacco abuse -Patient states that he quit smoking 3 weeks prior to this admission   Total time spent 35 minutes. Greater than 50% spent face to face counseling and coordinating care.    Disposition Plan:SNF 4/12 if stable Family Communication:son updated on phone 4/12  Consultants:none  Code Status: FULL  DVT Prophylaxis: SCDs   Procedures: As Listed in Progress Note Above  Antibiotics: None    Subjective: Pt had an episode of hematochezia.  Patient denies fevers, chills, headache, chest pain, dyspnea, nausea, vomiting, diarrhea, abdominal pain, dysuria, hematuria,  and melena.   Objective: Vitals:   05/29/18 1300 05/29/18 2003 05/29/18 2110 05/30/18 0540  BP: 139/74  134/71 (!) 148/76  Pulse: 83  74 68  Resp:   18 19  Temp: 98.9 F (37.2 C)  98.6 F (37 C) 98.5 F (36.9 C)  TempSrc: Oral  Oral Oral  SpO2: 100% 97% 100% 100%  Weight:    99.4 kg  Height:        Intake/Output Summary (Last 24 hours) at 05/30/2018 1108 Last  data filed at 05/30/2018 0900 Gross per 24 hour  Intake 580 ml  Output 550 ml  Net 30  ml   Weight change: -2.8 kg Exam:   General:  Pt is alert, follows commands appropriately, not in acute distress  HEENT: No icterus, No thrush, No neck mass, [place]/AT  Cardiovascular: RRR, S1/S2, no rubs, no gallops  Respiratory: CTA bilaterally, no wheezing, no crackles, no rhonchi  Abdomen: Soft/+BS, non tender, non distended, no guarding  Extremities: 2+LE edema, No lymphangitis, No petechiae, No rashes, no synovitis   Data Reviewed: I have personally reviewed following labs and imaging studies Basic Metabolic Panel: Recent Labs  Lab 05/27/18 1353 05/27/18 2231 05/28/18 0707 05/28/18 1303 05/29/18 0615 05/30/18 0603  NA 139 139 138  --  139 138  K 3.1* 3.7 3.8  --  4.0 4.0  CL 113* 113* 113*  --  116* 114*  CO2 18* 16* 17*  --  15* 16*  GLUCOSE 136* 135* 129*  --  119* 116*  BUN 12 13 12   --  12 12  CREATININE 0.80 0.83 0.78  --  0.82 0.82  CALCIUM 5.4* 5.4* 5.6*  --  6.5* 7.0*  MG 1.1* 1.0* 1.6*  --  1.8 1.7  PHOS  --   --   --  3.0 3.3 3.6   Liver Function Tests: Recent Labs  Lab 05/27/18 1353 05/27/18 2231 05/28/18 0707 05/29/18 0615 05/30/18 0603  AST 44* 44* 40 43* 49*  ALT 26 24 25 23 27   ALKPHOS 110 92 101 98 107  BILITOT 0.6 0.4 1.3* 0.6 0.6  PROT 5.1* 4.9* 5.2* 5.0* 5.1*  ALBUMIN 2.3* 2.0* 2.1* 2.0* 2.1*   No results for input(s): LIPASE, AMYLASE in the last 168 hours. No results for input(s): AMMONIA in the last 168 hours. Coagulation Profile: Recent Labs  Lab 05/28/18 0707  INR 1.4*   CBC: Recent Labs  Lab 05/27/18 1353 05/27/18 2231 05/28/18 0707 05/30/18 0603  WBC 8.7  --  7.1 6.7  NEUTROABS 6.5  --   --   --   HGB 7.7* 6.8* 8.1* 8.4*  HCT 23.6*  --  24.3* 26.3*  MCV 94.8  --  93.8 97.8  PLT 347  --  292 308   Cardiac Enzymes: No results for input(s): CKTOTAL, CKMB, CKMBINDEX, TROPONINI in the last 168 hours. BNP: Invalid input(s): POCBNP CBG: Recent Labs  Lab 05/29/18 1105 05/29/18 1607 05/29/18 2106 05/29/18 2130  05/30/18 0748  GLUCAP 87 121* 106* 110* 84   HbA1C: No results for input(s): HGBA1C in the last 72 hours. Urine analysis:    Component Value Date/Time   COLORURINE AMBER (A) 05/27/2018 2127   APPEARANCEUR HAZY (A) 05/27/2018 2127   LABSPEC 1.025 05/27/2018 2127   PHURINE 5.0 05/27/2018 2127   GLUCOSEU NEGATIVE 05/27/2018 2127   HGBUR NEGATIVE 05/27/2018 2127   BILIRUBINUR NEGATIVE 05/27/2018 2127   KETONESUR NEGATIVE 05/27/2018 2127   PROTEINUR NEGATIVE 05/27/2018 2127   UROBILINOGEN 0.2 06/14/2008 1930   NITRITE NEGATIVE 05/27/2018 2127   LEUKOCYTESUR NEGATIVE 05/27/2018 2127   Sepsis Labs: @LABRCNTIP (procalcitonin:4,lacticidven:4) ) Recent Results (from the past 240 hour(s))  MRSA PCR Screening     Status: None   Collection Time: 05/27/18  8:30 PM  Result Value Ref Range Status   MRSA by PCR NEGATIVE NEGATIVE Final    Comment:        The GeneXpert MRSA Assay (FDA approved for NASAL specimens only), is one component of a comprehensive MRSA  colonization surveillance program. It is not intended to diagnose MRSA infection nor to guide or monitor treatment for MRSA infections. Performed at [facility], [address], [place], [place] [address]      Scheduled Meds: . atorvastatin  10 mg Oral QHS  . calcium carbonate  2 tablet Oral BID WC  . carvedilol  25 mg Oral BID WC  . ferrous sulfate  325 mg Oral TID  . folic acid  1 mg Oral Daily  . insulin aspart  0-9 Units Subcutaneous TID WC  . pantoprazole  40 mg Oral Daily  . polyethylene glycol  17 g Oral Daily  . senna  2 tablet Oral Daily  . thiamine  100 mg Oral Daily  . Vitamin D (Ergocalciferol)  50,000 Units Oral Q7 days   Continuous Infusions:  Procedures/Studies: Dg Chest 2 View  Result Date: 05/13/2018 CLINICAL DATA:  Cough EXAM: CHEST - 2 VIEW COMPARISON:  07/17/2006 FINDINGS: The heart size and mediastinal contours are within normal limits. Both lungs are clear. The visualized skeletal structures are  unremarkable. IMPRESSION: No active cardiopulmonary disease. Electronically Signed   By: Franchot Gallo M.D.   On: 05/13/2018 14:22   Ct Head Wo Contrast  Result Date: 05/13/2018 CLINICAL DATA:  Weakness and dizziness for several weeks EXAM: CT HEAD WITHOUT CONTRAST TECHNIQUE: Contiguous axial images were obtained from the base of the skull through the vertex without intravenous contrast. COMPARISON:  None. FINDINGS: Brain: Mild atrophic changes and chronic white matter ischemic change is identified. No findings to suggest acute hemorrhage, acute infarction or space-occupying mass lesion seen. Few scattered lacunar infarcts are noted in the basal ganglia and thalami bilaterally. Vascular: No hyperdense vessel or unexpected calcification. Skull: Normal. Negative for fracture or focal lesion. Sinuses/Orbits: No acute finding. Other: None. IMPRESSION: Chronic atrophic and ischemic changes without acute abnormality. Electronically Signed   By: Inez Catalina M.D.   On: 05/13/2018 14:18   Mr Brain Wo Contrast  Result Date: 05/13/2018 CLINICAL DATA:  Three-week history of dizziness and gait disturbance. EXAM: MRI HEAD WITHOUT CONTRAST TECHNIQUE: Multiplanar, multiecho pulse sequences of the brain and surrounding structures were obtained without intravenous contrast. COMPARISON:  Head CT 05/13/2018 FINDINGS: Brain: Diffusion imaging does not show any acute or subacute infarction. The brainstem and cerebellum are normal. There is an old lacunar infarction in the right thalamus and in both basal ganglia and radiating white matter tracts. No large vessel territory infarction. No mass lesion, hemorrhage, hydrocephalus or extra-axial collection. Vascular: Major vessels at the base of the brain show flow. Skull and upper cervical spine: Negative Sinuses/Orbits: Clear/normal Other: None IMPRESSION: Motion degraded exam. No acute finding. 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 Electronically Signed   By: Nelson Chimes M.D.   On: 05/13/2018 15:20    Orson Eva, DO  [facility] Pager [phone]  If 7PM-7AM, please contact night-coverage [email or web address] Password [email or web address] 05/30/2018, 11:08 AM   LOS: 1 day

## 2018-05-31 ENCOUNTER — Encounter (HOSPITAL_COMMUNITY): Payer: Self-pay | Admitting: Gastroenterology

## 2018-05-31 LAB — COMPREHENSIVE METABOLIC PANEL
ALT: 27 U/L (ref 0–44)
AST: 48 U/L — ABNORMAL HIGH (ref 15–41)
Albumin: 2 g/dL — ABNORMAL LOW (ref 3.5–5.0)
Alkaline Phosphatase: 109 U/L (ref 38–126)
Anion gap: 6 (ref 5–15)
BUN: 11 mg/dL (ref 8–23)
CO2: 18 mmol/L — ABNORMAL LOW (ref 22–32)
Calcium: 6.9 mg/dL — ABNORMAL LOW (ref 8.9–10.3)
Chloride: 114 mmol/L — ABNORMAL HIGH (ref 98–111)
Creatinine, Ser: 0.8 mg/dL (ref 0.61–1.24)
GFR calc Af Amer: >60 mL/min (ref >60)
GFR calc non Af Amer: >60 mL/min (ref >60)
Glucose, Bld: 87 mg/dL (ref 70–99)
Potassium: 4 mmol/L (ref 3.5–5.1)
Sodium: 138 mmol/L (ref 135–145)
Total Bilirubin: 0.7 mg/dL (ref 0.3–1.2)
Total Protein: 4.9 g/dL — ABNORMAL LOW (ref 6.5–8.1)

## 2018-05-31 LAB — TYPE AND SCREEN
ABO/RH(D): B POS
Antibody Screen: NEGATIVE
Unit division: 0
Unit division: 0

## 2018-05-31 LAB — BPAM RBC
Blood Product Expiration Date: 202004252359
Blood Product Expiration Date: 202004282359
ISSUE DATE / TIME: 202004100206
Unit Type and Rh: 5100
Unit Type and Rh: 5100

## 2018-05-31 LAB — GLUCOSE, CAPILLARY
Glucose-Capillary: 117 mg/dL — ABNORMAL HIGH (ref 70–99)
Glucose-Capillary: 103 mg/dL — ABNORMAL HIGH (ref 70–99)
Glucose-Capillary: 73 mg/dL (ref 70–99)
Glucose-Capillary: 85 mg/dL (ref 70–99)

## 2018-05-31 LAB — CBC
HCT: 25 % — ABNORMAL LOW (ref 39.0–52.0)
Hemoglobin: 8 g/dL — ABNORMAL LOW (ref 13.0–17.0)
MCH: 30.9 pg (ref 26.0–34.0)
MCHC: 32 g/dL (ref 30.0–36.0)
MCV: 96.5 fL (ref 80.0–100.0)
Platelets: 306 10*3/uL (ref 150–400)
RBC: 2.59 MIL/uL — ABNORMAL LOW (ref 4.22–5.81)
RDW: 15.6 % — ABNORMAL HIGH (ref 11.5–15.5)
WBC: 6.2 10*3/uL (ref 4.0–10.5)
nRBC: 0 % (ref 0.0–0.2)

## 2018-05-31 LAB — MAGNESIUM: Magnesium: 1.5 mg/dL — ABNORMAL LOW (ref 1.7–2.4)

## 2018-05-31 MED ORDER — VITAMIN D (ERGOCALCIFEROL) 1.25 MG (50000 UNIT) PO CAPS: 50000.0000 [IU] | ORAL_CAPSULE | ORAL | 0 refills | Status: DC

## 2018-05-31 MED ORDER — SENNA 8.6 MG PO TABS: 2.0000 | ORAL_TABLET | Freq: Every day | ORAL | 0 refills | Status: DC

## 2018-05-31 MED ORDER — POLYETHYLENE GLYCOL 3350 17 G PO PACK: 17.0000 g | PACK | Freq: Every day | ORAL | 0 refills | Status: DC

## 2018-05-31 MED ORDER — PEG 3350-KCL-NA BICARB-NACL 420 G PO SOLR
4000.0000 mL | Freq: Once | ORAL | Status: AC
  Administered 2018-05-31: 12:00:00 4000 mL via ORAL

## 2018-05-31 MED ORDER — HYDROCORTISONE (PERIANAL) 2.5 % EX CREA
TOPICAL_CREAM | Freq: Three times a day (TID) | CUTANEOUS | Status: DC
  Administered 2018-05-31 – 2018-06-01 (×4): via RECTAL
  Filled 2018-05-31: qty 28.35

## 2018-05-31 MED ORDER — MAGNESIUM OXIDE 400 (241.3 MG) MG PO TABS: 400.0000 mg | ORAL_TABLET | Freq: Every day | ORAL | 1 refills | Status: DC

## 2018-05-31 MED ORDER — MAGNESIUM SULFATE 2 GM/50ML IV SOLN
2.0000 g | Freq: Once | INTRAVENOUS | Status: AC
  Administered 2018-05-31: 10:00:00 2 g via INTRAVENOUS
  Filled 2018-05-31: qty 50

## 2018-05-31 NOTE — Clinical Social Work Note (Signed)
Status update was provided to Cari Caraway with [facility].     Amoria Mclees, Clydene Pugh, LCSW

## 2018-05-31 NOTE — Consult Note (Addendum)
Referring Provider: Dr. Carles Collet  Primary Care Physician:  [name], MD Primary Gastroenterologist:  Dr. Gala Romney   Date of Admission: 05/27/2018 Date of Consultation: 05/31/2018  Reason for Consultation: Rectal bleeding   HPI:  Darren Howard is a 68 y.o. year old male who was recently inpatient at [facility] end of March with symptomatic anemia, heme positive stool, s/p EGD with normal esophagus, erosive gastropathy s/p biopsy, and duodenal erosions. Findings felt likely secondary to NSAID effect. Received 2 units PRBCs that admission. He was to have an outpatient colonoscopy arranged. Discharge Hgb was 7.8. Was discharged to the [facility]. Presented to ED from [facility] with Hgb 7.7 and multiple electrolyte abnormalities. Hgb dropped to 6.8 this admission and received 1 unit PRBCs with improvement to 8 range. GI now consulted due to hematochezia in setting of constipation.   Notes small amount of blood with BMs, "not that much". Only with BM. Sometimes straining. Not much of an appetite. Rectal discomfort after BM but resolves "after awhile". Feels incomplete BMs. BM 1-2 a day but mushy. Some fecal incontinence if passing flatus. Posptrandial urgency but not complete. Doesn't have any sensation of prolapse. No abdominal pain. No alcohol since last hospitalization. History of alcohol abuse. Appetite is good. No recent NSAIDs but was endorsing use during last hospitalization. No prior colonoscopy or any known family history of colorectal cancer or polyps.   Past Medical History:  Diagnosis Date  . Alcohol abuse   . Anemia   . Diabetes mellitus without complication ([place])   . Duodenal erosion   . Erosive gastropathy   . GI bleed 04/2018  . Glucose intolerance (impaired glucose tolerance)   . Hyperlipemia   . Hypertension   . Noncompliance     Past Surgical History:  Procedure Laterality Date  . BIOPSY  05/17/2018   Procedure: BIOPSY;  Surgeon: Daneil Dolin, MD;  Location: [facility];  Service: Endoscopy;;  . CARDIAC CATHETERIZATION    . ESOPHAGOGASTRODUODENOSCOPY (EGD) WITH PROPOFOL N/A 05/17/2018   Dr. Gala Romney: normal esophagus, erosive gastropathy s/p biopsy, duodenal erosions, felt to be NSAID effect  . HERNIA REPAIR      Prior to Admission medications   Medication Sig Start Date End Date Taking? Authorizing Provider  atorvastatin (LIPITOR) 10 MG tablet Take 10 mg by mouth at bedtime.   Yes [provider]  carvedilol (COREG) 25 MG tablet Take 25 mg by mouth 2 (two) times daily.   Yes [provider]  folic acid (FOLVITE) 1 MG tablet Take 1 tablet (1 mg total) by mouth daily for 30 days. 05/19/18 06/18/18 Yes Shah, Pratik D, DO  metFORMIN (GLUCOPHAGE-XR) 500 MG 24 hr tablet Take 500 mg by mouth 2 (two) times daily.    Yes [provider]  pantoprazole (PROTONIX) 40 MG tablet Take 1 tablet (40 mg total) by mouth daily for 30 days. 05/19/18 06/18/18 Yes Shah, Pratik D, DO  polyethylene glycol (MIRALAX / GLYCOLAX) 17 g packet Take 17 g by mouth daily.   Yes [provider]  senna (SENOKOT) 8.6 MG TABS tablet Take 1 tablet by mouth 2 (two) times daily.   Yes [provider]  thiamine 100 MG tablet Take 1 tablet (100 mg total) by mouth daily for 30 days. 05/19/18 06/18/18 Yes Shah, Pratik D, DO  Vitamin D, Ergocalciferol, (DRISDOL) 1.25 MG (50000 UT) CAPS capsule Take 50,000 Units by mouth every 7 (seven) days.   Yes [provider]  amLODipine (NORVASC) 2.5 MG  tablet Take 4 tablets (10 mg total) by mouth daily. 05/30/18   Orson Eva, MD  calcium carbonate (TUMS - DOSED IN MG ELEMENTAL CALCIUM) 500 MG chewable tablet Chew 2 tablets (400 mg of elemental calcium total) by mouth 2 (two) times daily with a meal. 05/30/18   Tat, Shanon Brow, MD  ferrous sulfate 325 (65 FE) MG tablet Take 1 tablet (325 mg total) by mouth daily with breakfast. 05/31/18   Tat, Shanon Brow, MD  magnesium oxide (MAG-OX) 400 (241.3 Mg) MG tablet Take 1 tablet (400 mg total)  by mouth daily. 06/01/18   Orson Eva, MD  polyethylene glycol (MIRALAX / GLYCOLAX) 17 g packet Take 17 g by mouth daily. 06/01/18   Orson Eva, MD  senna (SENOKOT) 8.6 MG TABS tablet Take 2 tablets (17.2 mg total) by mouth daily. 06/01/18   Orson Eva, MD  Vitamin D, Ergocalciferol, (DRISDOL) 1.25 MG (50000 UT) CAPS capsule Take 1 capsule (50,000 Units total) by mouth every 7 (seven) days. Next dose 06/05/18 06/05/18   Orson Eva, MD    Current Facility-Administered Medications  Medication Dose Route Frequency Provider Last Rate Last Dose  . acetaminophen (TYLENOL) tablet 650 mg  650 mg Oral Q6H PRN Howerter, Justin B, DO   650 mg at 05/27/18 2056   Or  . acetaminophen (TYLENOL) suppository 650 mg  650 mg Rectal Q6H PRN Howerter, Justin B, DO      . atorvastatin (LIPITOR) tablet 10 mg  10 mg Oral QHS Howerter, Justin B, DO   10 mg at 05/30/18 2130  . calcium carbonate (TUMS - dosed in mg elemental calcium) chewable tablet 400 mg of elemental calcium  2 tablet Oral BID WC Tat, David, MD   400 mg of elemental calcium at 05/31/18 0814  . carvedilol (COREG) tablet 25 mg  25 mg Oral BID WC Howerter, Justin B, DO   25 mg at 05/31/18 0815  . ferrous sulfate tablet 325 mg  325 mg Oral Q breakfast Tat, Shanon Brow, MD   325 mg at 05/31/18 0814  . folic acid (FOLVITE) tablet 1 mg  1 mg Oral Daily Howerter, Justin B, DO   1 mg at 05/31/18 0815  . insulin aspart (novoLOG) injection 0-9 Units  0-9 Units Subcutaneous TID WC Howerter, Justin B, DO   1 Units at 05/29/18 1636  . magnesium oxide (MAG-OX) tablet 400 mg  400 mg Oral Daily Tat, David, MD   400 mg at 05/31/18 0815  . magnesium sulfate IVPB 2 g 50 mL  2 g Intravenous Once Tat, Shanon Brow, MD 50 mL/hr at 05/31/18 1009 2 g at 05/31/18 1009  . ondansetron (ZOFRAN) injection 4 mg  4 mg Intravenous Q6H PRN Tat, David, MD      . pantoprazole (PROTONIX) EC tablet 40 mg  40 mg Oral Daily Howerter, Justin B, DO   40 mg at 05/31/18 0815  . polyethylene glycol (MIRALAX /  GLYCOLAX) packet 17 g  17 g Oral Daily Tat, David, MD   17 g at 05/31/18 2947  . senna (SENOKOT) tablet 17.2 mg  2 tablet Oral Daily Tat, David, MD   17.2 mg at 05/31/18 0815  . thiamine (VITAMIN B-1) tablet 100 mg  100 mg Oral Daily Howerter, Justin B, DO   100 mg at 05/31/18 0815  . Vitamin D (Ergocalciferol) (DRISDOL) capsule 50,000 Units  50,000 Units Oral Q7 days Tat, Shanon Brow, MD   50,000 Units at 05/29/18 1018    Allergies as of 05/27/2018  . (No  Known Allergies)    Family History  Problem Relation Age of Onset  . Congestive Heart Failure Mother   . Congestive Heart Failure Father   . Cancer Brother        unknown kind   . Colon cancer Neg Hx   . Colon polyps Neg Hx     Social History   Socioeconomic History  . Marital status: Widowed    Spouse name: Not on file  . Number of children: Not on file  . Years of education: Not on file  . Highest education level: Not on file  Occupational History  . Occupation: retired    Comment: [employer]  . Financial resource strain: Not on file  . Food insecurity:    Worry: Not on file    Inability: Not on file  . Transportation needs:    Medical: Not on file    Non-medical: Not on file  Tobacco Use  . Smoking status: Current Every Day Smoker    Packs/day: 0.50    Years: 30.00    Pack years: 15.00    Types: Cigarettes  . Smokeless tobacco: Never Used  Substance and Sexual Activity  . Alcohol use: Yes    Alcohol/week: 0.0 standard drinks    Comment: pint per day   . Drug use: No  . Sexual activity: Not on file  Lifestyle  . Physical activity:    Days per week: Not on file    Minutes per session: Not on file  . Stress: Not on file  Relationships  . Social connections:    Talks on phone: Not on file    Gets together: Not on file    Attends religious service: Not on file    Active member of club or organization: Not on file    Attends meetings of clubs or organizations: Not on file    Relationship status:  Not on file  . Intimate partner violence:    Fear of current or ex partner: Not on file    Emotionally abused: Not on file    Physically abused: Not on file    Forced sexual activity: Not on file  Other Topics Concern  . Not on file  Social History Narrative  . Not on file    Review of Systems: Gen: see HPI CV: Denies chest pain, heart palpitations, syncope, edema  Resp: Denies shortness of breath with rest, cough, wheezing GI: see HPI GU : Denies urinary burning, urinary frequency, urinary incontinence.  MS: + lower extremity edema Derm: Denies rash, itching, dry skin Psych: Denies depression, anxiety,confusion, or memory loss Heme: see HPI  Physical Exam: Vital signs in last 24 hours: Temp:  [97.7 F (36.5 C)-98.7 F (37.1 C)] 98.5 F (36.9 C) (04/13 0509) Pulse Rate:  [65-80] 69 (04/13 0509) Resp:  [16] 16 (04/13 0509) BP: (132-165)/(66-77) 165/77 (04/13 0509) SpO2:  [100 %] 100 % (04/13 0509) Weight:  [100 kg] 100 kg (04/13 0509) Last BM Date: 05/30/18 General:   Alert,  Well-developed, well-nourished, pleasant and cooperative in NAD Head:  Normocephalic and atraumatic. Eyes:  Sclera clear, no icterus.   Conjunctiva pink. Ears:  Normal auditory acuity. Nose:  No deformity, discharge,  or lesions. Mouth:  No deformity or lesions, dentition normal. Lungs:  Clear throughout to auscultation.    Heart:  S1 S2 present ; no murmurs, clicks, rubs,  or gallops. Abdomen:  Soft, nontender and nondistended. No masses. +BS. Liver margin palpable below right costal margin Rectal:  External exam with hemorrhoid tags and possible Grade 3 hemorrhoid but difficult to visualize due to patient's discomfort. With attempted DRE, able to reduce. Unable to complete internal exam due to significant discomfort.  Bright red blood on pad and small amount on gloved finger. Msk:  Symmetrical without gross deformities. Normal posture. Extremities:  With 2+ pitting lower extremity and pedal  edema Neurologic:  Alert and  oriented x4 Psych:  Alert and cooperative. Normal mood and affect.  Intake/Output from previous day: 04/12 0701 - 04/13 0700 In: 960 [P.O.:960] Out: 800 [Urine:800] Intake/Output this shift: No intake/output data recorded.  Lab Results: Recent Labs    05/30/18 0603 05/31/18 0527  WBC 6.7 6.2  HGB 8.4* 8.0*  HCT 26.3* 25.0*  PLT 308 306   BMET Recent Labs    05/29/18 0615 05/30/18 0603 05/31/18 0527  NA 139 138 138  K 4.0 4.0 4.0  CL 116* 114* 114*  CO2 15* 16* 18*  GLUCOSE 119* 116* 87  BUN 12 12 11   CREATININE 0.82 0.82 0.80  CALCIUM 6.5* 7.0* 6.9*   LFT Recent Labs    05/29/18 0615 05/30/18 0603 05/31/18 0527  PROT 5.0* 5.1* 4.9*  ALBUMIN 2.0* 2.1* 2.0*  AST 43* 49* 48*  ALT 23 27 27   ALKPHOS 98 107 109  BILITOT 0.6 0.6 0.7   Lab Results  Component Value Date   INR 1.4 (H) 05/28/2018   INR 1.10 02/13/2014   INR 1.0 07/19/2006      Impression: 68 year old male with acute on chronic anemia, now with low to moderate volume hematochezia in setting of constipation. Prior EGD completed during last admission with NSAID effect, no ulcers. Previous history of alcohol abuse but has been sober since last hospitalization in March 2020, s/p discharge to [facility]. No further NSAID use since last hospitalization. Clinically, likely dealing with anal fissure and internal hemorrhoids but unable to exclude occult malignancy in setting of transfusion dependent anemia, rectal bleeding. No prior colonoscopy.  Will pursue colonoscopy with Propofol on 06/01/2018 by Dr. Gala Romney. I discussed risks and benefits with patient, who stated understanding and desires to proceed. Will also start empirically on nitrogylcerin ointment due to suspected anal fissure. Adding anusol BID as well.   As of note, lower extremity edema in setting of hypoalbuminemia. With his prior history of ETOH abuse , concern for underlying liver disease. Would recommend [place] on  an elective basis in future. Last INR 1.4 several days ago.   Plan: Clear liquids today Golytely today NPO after midnight except sips with meds Colonoscopy 06/01/2018 by Dr. Gala Romney with Propofol Nitroglycerin per rectum QID and Anusol BID   Annitta Needs, PhD, ANP-BC [facility]     LOS: 2 days    05/31/2018, 10:14 AM  ADDENDUM: Nitroglycerin preparation for rectal application not available while inpatient. Will continue with Anusol.    Attending note:  Agree.  Plan for colonoscopy tomorrow.

## 2018-05-31 NOTE — Care Management Important Message (Signed)
Important Message  Patient Details  Name: Darren Howard MRN: [id number] Date of Birth: 1950-08-10   Medicare Important Message Given:  Yes    [name] 05/31/2018, 11:56 AM

## 2018-05-31 NOTE — Progress Notes (Signed)
PROGRESS NOTE  Darren Howard JME:[id number] DOB: 09/24/1950 DOA: 05/27/2018 PCP: [name], MD  Brief History: 68 year old male with a history of GI bleed secondary to NSAIDs, hypertension, diabetes mellitus type 2, hyperlipidemia, alcohol abuse in remission, tobacco abuse in remission presenting with low hemoglobin on routine blood work. The patient was recently mated to the hospital from 05/13/2018 through 05/18/2018 for an upper GI bleed. The patient had EGD on 05/17/2018 which revealed multiple erosions in the gastric antrum and duodenal erosions. This was felt to be due to NSAIDs. During that hospitalization, the patient was transfused 2 units PRBC and discharged with PPI. He was discharged to the [facility] in [place]. Since his discharge, the patient denies any further hematochezia, melena, fevers, chills, chest pain, nausea, vomiting, diarrhea. Apparently, the patient had a routine CBC done on 05/25/2018 or 05/26/2018 which revealed his hemoglobin to be 7.1. As result, the patient was transferred to emergency department for further evaluation. In the emergency department, the patient was noted to have hemoglobin of 7.7 which was essentially unchanged from his hemoglobin on the day of discharge (7.8)on 05/17/1928. EDP spoke with GI who did not feel that the patient needed to be admitted for further GI work-up. The patient has a follow-up appointment with GI as an outpatient on 06/16/2018. However, because the patient had multiple electrolyte abnormalities, the patient was admitted for further treatment and evaluation.The patient was noted to have hypomagnesemia, hypocalcemia, and hypokalemia. These were repleted aggressively. In early am 05/30/18 and 05/31/18, the patient had an episode of hematochezia.  As a result, his discharge was delayed to monitor his Hgb and clinical situation.  Assessment/Plan: Hypocalcemia -Patient presented with a corrected calcium 6.8>>8.5  -given nine grams of calcium gluconate -continue po calcium carbonate -Intact PTH--47 -25 vitamin D--7.7 -check phosphorus--3.0 -Check magnesium--1.1>>>1.8  Hypomagnesemia -give additional 2 g IV magnesium 4/13 -Start p.o. magnesium oxide  Hypokalemia -Repleted -In part due to hypomagnesemia  Hematochezia -4/12 and 4/13 am--had episodes -delay discharge to monitor for continued bleeding and Hgb -start miralax and senna -re-consult GI  Anemia of chronic disease -05/13/2018 iron saturation 86%, ferritin 1005, I29 798 -11/07/17 folic acid 2.4 -continue po iron and folic acid  Diabetes mellitus type 2, controlled -04/23/2018 hemoglobin A1c 6.9 -NovoLog sliding scale -holding metformin   Erosive gastropathy -continue PPI  Essential hypertension -Continue carvedilol  Hyperlipidemia -Continue statin  Alcohol abuse in remission -Continue thiamine  Tobacco abuse -Patient states that he quit smoking 3 weeks prior to this admission   Total time spent 35 minutes. Greater than 50% spent face to face counseling and coordinating care.    Disposition Plan:SNF 4/14if stable Family Communication:son updated on phone 4/13  Consultants:none  Code Status: FULL  DVT Prophylaxis: SCDs   Procedures: As Listed in Progress Note Above  Antibiotics: None   Subjective: Pt had another episode of hematochezia this am.  He denies abd pain, n/v, cp, sob, f/c, dizziness  Objective: Vitals:   05/30/18 0540 05/30/18 1300 05/30/18 2059 05/31/18 0509  BP: (!) 148/76 133/67 132/66 (!) 165/77  Pulse: 68 80 65 69  Resp: 19  16 16   Temp: 98.5 F (36.9 C) 98.7 F (37.1 C) 97.7 F (36.5 C) 98.5 F (36.9 C)  TempSrc: Oral Oral Oral Oral  SpO2: 100%  100% 100%  Weight: 99.4 kg   100 kg  Height:        Intake/Output Summary (Last 24 hours) at 05/31/2018 0905 Last  data filed at 05/31/2018 0500 Gross per 24 hour  Intake 720 ml  Output 700 ml   Net 20 ml   Weight change: 0.6 kg Exam:   General:  Pt is alert, follows commands appropriately, not in acute distress  HEENT: No icterus, No thrush, No neck mass, [place]/AT  Cardiovascular: RRR, S1/S2, no rubs, no gallops  Respiratory: CTA bilaterally, no wheezing, no crackles, no rhonchi  Abdomen: Soft/+BS, non tender, non distended, no guarding  Extremities: 1+ LE edema, No lymphangitis, No petechiae, No rashes, no synovitis   Data Reviewed: I have personally reviewed following labs and imaging studies Basic Metabolic Panel: Recent Labs  Lab 05/27/18 2231 05/28/18 0707 05/28/18 1303 05/29/18 0615 05/30/18 0603 05/31/18 0527 05/31/18 0835  NA 139 138  --  139 138 138  --   K 3.7 3.8  --  4.0 4.0 4.0  --   CL 113* 113*  --  116* 114* 114*  --   CO2 16* 17*  --  15* 16* 18*  --   GLUCOSE 135* 129*  --  119* 116* 87  --   BUN 13 12  --  12 12 11   --   CREATININE 0.83 0.78  --  0.82 0.82 0.80  --   CALCIUM 5.4* 5.6*  --  6.5* 7.0* 6.9*  --   MG 1.0* 1.6*  --  1.8 1.7  --  1.5*  PHOS  --   --  3.0 3.3 3.6  --   --    Liver Function Tests: Recent Labs  Lab 05/27/18 2231 05/28/18 0707 05/29/18 0615 05/30/18 0603 05/31/18 0527  AST 44* 40 43* 49* 48*  ALT 24 25 23 27 27   ALKPHOS 92 101 98 107 109  BILITOT 0.4 1.3* 0.6 0.6 0.7  PROT 4.9* 5.2* 5.0* 5.1* 4.9*  ALBUMIN 2.0* 2.1* 2.0* 2.1* 2.0*   No results for input(s): LIPASE, AMYLASE in the last 168 hours. No results for input(s): AMMONIA in the last 168 hours. Coagulation Profile: Recent Labs  Lab 05/28/18 0707  INR 1.4*   CBC: Recent Labs  Lab 05/27/18 1353 05/27/18 2231 05/28/18 0707 05/30/18 0603 05/31/18 0527  WBC 8.7  --  7.1 6.7 6.2  NEUTROABS 6.5  --   --   --   --   HGB 7.7* 6.8* 8.1* 8.4* 8.0*  HCT 23.6*  --  24.3* 26.3* 25.0*  MCV 94.8  --  93.8 97.8 96.5  PLT 347  --  292 308 306   Cardiac Enzymes: No results for input(s): CKTOTAL, CKMB, CKMBINDEX, TROPONINI in the last 168 hours.  BNP: Invalid input(s): POCBNP CBG: Recent Labs  Lab 05/30/18 0748 05/30/18 1107 05/30/18 1620 05/30/18 2111 05/31/18 0755  GLUCAP 84 112* 104* 124* 117*   HbA1C: No results for input(s): HGBA1C in the last 72 hours. Urine analysis:    Component Value Date/Time   COLORURINE AMBER (A) 05/27/2018 2127   APPEARANCEUR HAZY (A) 05/27/2018 2127   LABSPEC 1.025 05/27/2018 2127   PHURINE 5.0 05/27/2018 2127   GLUCOSEU NEGATIVE 05/27/2018 2127   HGBUR NEGATIVE 05/27/2018 2127   BILIRUBINUR NEGATIVE 05/27/2018 2127   KETONESUR NEGATIVE 05/27/2018 2127   PROTEINUR NEGATIVE 05/27/2018 2127   UROBILINOGEN 0.2 06/14/2008 1930   NITRITE NEGATIVE 05/27/2018 2127   LEUKOCYTESUR NEGATIVE 05/27/2018 2127   Sepsis Labs: @LABRCNTIP (procalcitonin:4,lacticidven:4) ) Recent Results (from the past 240 hour(s))  MRSA PCR Screening     Status: None   Collection Time: 05/27/18  8:30  PM  Result Value Ref Range Status   MRSA by PCR NEGATIVE NEGATIVE Final    Comment:        The GeneXpert MRSA Assay (FDA approved for NASAL specimens only), is one component of a comprehensive MRSA colonization surveillance program. It is not intended to diagnose MRSA infection nor to guide or monitor treatment for MRSA infections. Performed at [facility], [address], [place], [place] [address]      Scheduled Meds: . atorvastatin  10 mg Oral QHS  . calcium carbonate  2 tablet Oral BID WC  . carvedilol  25 mg Oral BID WC  . ferrous sulfate  325 mg Oral Q breakfast  . folic acid  1 mg Oral Daily  . insulin aspart  0-9 Units Subcutaneous TID WC  . magnesium oxide  400 mg Oral Daily  . pantoprazole  40 mg Oral Daily  . polyethylene glycol  17 g Oral Daily  . senna  2 tablet Oral Daily  . thiamine  100 mg Oral Daily  . Vitamin D (Ergocalciferol)  50,000 Units Oral Q7 days   Continuous Infusions: . magnesium sulfate 1 - 4 g bolus IVPB      Procedures/Studies: Dg Chest 2 View  Result Date:  05/13/2018 CLINICAL DATA:  Cough EXAM: CHEST - 2 VIEW COMPARISON:  07/17/2006 FINDINGS: The heart size and mediastinal contours are within normal limits. Both lungs are clear. The visualized skeletal structures are unremarkable. IMPRESSION: No active cardiopulmonary disease. Electronically Signed   By: Franchot Gallo M.D.   On: 05/13/2018 14:22   Ct Head Wo Contrast  Result Date: 05/13/2018 CLINICAL DATA:  Weakness and dizziness for several weeks EXAM: CT HEAD WITHOUT CONTRAST TECHNIQUE: Contiguous axial images were obtained from the base of the skull through the vertex without intravenous contrast. COMPARISON:  None. FINDINGS: Brain: Mild atrophic changes and chronic white matter ischemic change is identified. No findings to suggest acute hemorrhage, acute infarction or space-occupying mass lesion seen. Few scattered lacunar infarcts are noted in the basal ganglia and thalami bilaterally. Vascular: No hyperdense vessel or unexpected calcification. Skull: Normal. Negative for fracture or focal lesion. Sinuses/Orbits: No acute finding. Other: None. IMPRESSION: Chronic atrophic and ischemic changes without acute abnormality. Electronically Signed   By: Inez Catalina M.D.   On: 05/13/2018 14:18   Mr Brain Wo Contrast  Result Date: 05/13/2018 CLINICAL DATA:  Three-week history of dizziness and gait disturbance. EXAM: MRI HEAD WITHOUT CONTRAST TECHNIQUE: Multiplanar, multiecho pulse sequences of the brain and surrounding structures were obtained without intravenous contrast. COMPARISON:  Head CT 05/13/2018 FINDINGS: Brain: Diffusion imaging does not show any acute or subacute infarction. The brainstem and cerebellum are normal. There is an old lacunar infarction in the right thalamus and in both basal ganglia and radiating white matter tracts. No large vessel territory infarction. No mass lesion, hemorrhage, hydrocephalus or extra-axial collection. Vascular: Major vessels at the base of the brain show flow. Skull  and upper cervical spine: Negative Sinuses/Orbits: Clear/normal Other: None IMPRESSION: Motion degraded exam. No acute finding. 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 Electronically Signed   By: Nelson Chimes M.D.   On: 05/13/2018 15:20    Orson Eva, DO  [facility] Pager [phone]  If 7PM-7AM, please contact night-coverage [email or web address] Password [email or web address] 05/31/2018, 9:05 AM   LOS: 2 days

## 2018-06-01 ENCOUNTER — Inpatient Hospital Stay (HOSPITAL_COMMUNITY): Payer: Medicare Other | Admitting: Anesthesiology

## 2018-06-01 ENCOUNTER — Encounter (HOSPITAL_COMMUNITY): Payer: Self-pay | Admitting: *Deleted

## 2018-06-01 ENCOUNTER — Encounter (HOSPITAL_COMMUNITY)
Admission: EM | Disposition: A | Discharge status: Home Health | Payer: Self-pay | Source: Skilled Nursing Facility | Attending: Internal Medicine

## 2018-06-01 DIAGNOSIS — M6281 Muscle weakness (generalized): Secondary | ICD-10-CM | POA: Diagnosis not present

## 2018-06-01 DIAGNOSIS — I10 Essential (primary) hypertension: Secondary | ICD-10-CM | POA: Diagnosis not present

## 2018-06-01 DIAGNOSIS — Z741 Need for assistance with personal care: Secondary | ICD-10-CM | POA: Diagnosis not present

## 2018-06-01 DIAGNOSIS — F1099 Alcohol use, unspecified with unspecified alcohol-induced disorder: Secondary | ICD-10-CM | POA: Diagnosis not present

## 2018-06-01 DIAGNOSIS — K922 Gastrointestinal hemorrhage, unspecified: Secondary | ICD-10-CM | POA: Diagnosis not present

## 2018-06-01 DIAGNOSIS — K319 Disease of stomach and duodenum, unspecified: Secondary | ICD-10-CM | POA: Diagnosis not present

## 2018-06-01 DIAGNOSIS — D500 Iron deficiency anemia secondary to blood loss (chronic): Secondary | ICD-10-CM | POA: Diagnosis not present

## 2018-06-01 DIAGNOSIS — D649 Anemia, unspecified: Secondary | ICD-10-CM | POA: Diagnosis not present

## 2018-06-01 DIAGNOSIS — R42 Dizziness and giddiness: Secondary | ICD-10-CM | POA: Diagnosis not present

## 2018-06-01 DIAGNOSIS — K635 Polyp of colon: Secondary | ICD-10-CM

## 2018-06-01 DIAGNOSIS — E119 Type 2 diabetes mellitus without complications: Secondary | ICD-10-CM | POA: Diagnosis not present

## 2018-06-01 DIAGNOSIS — K648 Other hemorrhoids: Secondary | ICD-10-CM | POA: Diagnosis not present

## 2018-06-01 DIAGNOSIS — E876 Hypokalemia: Secondary | ICD-10-CM | POA: Diagnosis not present

## 2018-06-01 DIAGNOSIS — D120 Benign neoplasm of cecum: Secondary | ICD-10-CM | POA: Diagnosis not present

## 2018-06-01 DIAGNOSIS — E559 Vitamin D deficiency, unspecified: Secondary | ICD-10-CM | POA: Diagnosis not present

## 2018-06-01 DIAGNOSIS — K219 Gastro-esophageal reflux disease without esophagitis: Secondary | ICD-10-CM | POA: Diagnosis not present

## 2018-06-01 DIAGNOSIS — I160 Hypertensive urgency: Secondary | ICD-10-CM | POA: Diagnosis not present

## 2018-06-01 DIAGNOSIS — E118 Type 2 diabetes mellitus with unspecified complications: Secondary | ICD-10-CM | POA: Diagnosis not present

## 2018-06-01 DIAGNOSIS — Z9114 Patient's other noncompliance with medication regimen: Secondary | ICD-10-CM | POA: Diagnosis not present

## 2018-06-01 DIAGNOSIS — R262 Difficulty in walking, not elsewhere classified: Secondary | ICD-10-CM | POA: Diagnosis not present

## 2018-06-01 DIAGNOSIS — K643 Fourth degree hemorrhoids: Secondary | ICD-10-CM | POA: Diagnosis not present

## 2018-06-01 DIAGNOSIS — K264 Chronic or unspecified duodenal ulcer with hemorrhage: Secondary | ICD-10-CM | POA: Diagnosis not present

## 2018-06-01 DIAGNOSIS — D123 Benign neoplasm of transverse colon: Secondary | ICD-10-CM | POA: Diagnosis not present

## 2018-06-01 DIAGNOSIS — E1165 Type 2 diabetes mellitus with hyperglycemia: Secondary | ICD-10-CM | POA: Diagnosis not present

## 2018-06-01 DIAGNOSIS — K921 Melena: Secondary | ICD-10-CM | POA: Diagnosis not present

## 2018-06-01 DIAGNOSIS — E785 Hyperlipidemia, unspecified: Secondary | ICD-10-CM | POA: Diagnosis not present

## 2018-06-01 HISTORY — PX: COLONOSCOPY WITH PROPOFOL: SHX5780

## 2018-06-01 HISTORY — PX: POLYPECTOMY: SHX5525

## 2018-06-01 LAB — COMPREHENSIVE METABOLIC PANEL
ALT: 27 U/L (ref 0–44)
AST: 48 U/L — ABNORMAL HIGH (ref 15–41)
Albumin: 1.9 g/dL — ABNORMAL LOW (ref 3.5–5.0)
Alkaline Phosphatase: 104 U/L (ref 38–126)
Anion gap: 6 (ref 5–15)
BUN: 8 mg/dL (ref 8–23)
CO2: 18 mmol/L — ABNORMAL LOW (ref 22–32)
Calcium: 7 mg/dL — ABNORMAL LOW (ref 8.9–10.3)
Chloride: 113 mmol/L — ABNORMAL HIGH (ref 98–111)
Creatinine, Ser: 0.69 mg/dL (ref 0.61–1.24)
GFR calc Af Amer: >60 mL/min (ref >60)
GFR calc non Af Amer: >60 mL/min (ref >60)
Glucose, Bld: 83 mg/dL (ref 70–99)
Potassium: 4 mmol/L (ref 3.5–5.1)
Sodium: 137 mmol/L (ref 135–145)
Total Bilirubin: 0.9 mg/dL (ref 0.3–1.2)
Total Protein: 4.6 g/dL — ABNORMAL LOW (ref 6.5–8.1)

## 2018-06-01 LAB — CBC
HCT: 23.2 % — ABNORMAL LOW (ref 39.0–52.0)
Hemoglobin: 7.6 g/dL — ABNORMAL LOW (ref 13.0–17.0)
MCH: 30.9 pg (ref 26.0–34.0)
MCHC: 32.8 g/dL (ref 30.0–36.0)
MCV: 94.3 fL (ref 80.0–100.0)
Platelets: 290 10*3/uL (ref 150–400)
RBC: 2.46 MIL/uL — ABNORMAL LOW (ref 4.22–5.81)
RDW: 15.2 % (ref 11.5–15.5)
WBC: 6.3 10*3/uL (ref 4.0–10.5)
nRBC: 0 % (ref 0.0–0.2)

## 2018-06-01 LAB — GLUCOSE, CAPILLARY
Glucose-Capillary: 71 mg/dL (ref 70–99)
Glucose-Capillary: 69 mg/dL — ABNORMAL LOW (ref 70–99)
Glucose-Capillary: 85 mg/dL (ref 70–99)

## 2018-06-01 LAB — MAGNESIUM: Magnesium: 1.6 mg/dL — ABNORMAL LOW (ref 1.7–2.4)

## 2018-06-01 SURGERY — COLONOSCOPY WITH PROPOFOL
Anesthesia: General

## 2018-06-01 MED ORDER — HYDROMORPHONE HCL 1 MG/ML IJ SOLN: 0.2500 mg | INTRAMUSCULAR | Status: DC | PRN

## 2018-06-01 MED ORDER — MAGNESIUM OXIDE 400 (241.3 MG) MG PO TABS: 400.0000 mg | ORAL_TABLET | Freq: Two times a day (BID) | ORAL | 1 refills | Status: DC

## 2018-06-01 MED ORDER — PROPOFOL 10 MG/ML IV BOLUS
INTRAVENOUS | Status: DC | PRN
  Administered 2018-06-01: 20 mg via INTRAVENOUS
  Administered 2018-06-01: 2 mg via INTRAVENOUS

## 2018-06-01 MED ORDER — PROPOFOL 10 MG/ML IV BOLUS
INTRAVENOUS | Status: AC
  Filled 2018-06-01: qty 20

## 2018-06-01 MED ORDER — LACTATED RINGERS IV SOLN
INTRAVENOUS | Status: DC
  Administered 2018-06-01: 08:00:00 via INTRAVENOUS

## 2018-06-01 MED ORDER — HYDROCORTISONE (PERIANAL) 2.5 % EX CREA: TOPICAL_CREAM | Freq: Three times a day (TID) | CUTANEOUS | 0 refills | Status: DC

## 2018-06-01 MED ORDER — PROMETHAZINE HCL 25 MG/ML IJ SOLN: 6.2500 mg | INTRAMUSCULAR | Status: DC | PRN

## 2018-06-01 MED ORDER — SODIUM CHLORIDE 0.9 % IV SOLN: INTRAVENOUS | Status: DC

## 2018-06-01 MED ORDER — MIDAZOLAM HCL 2 MG/2ML IJ SOLN: 0.5000 mg | Freq: Once | INTRAMUSCULAR | Status: DC | PRN

## 2018-06-01 MED ORDER — HYDROCODONE-ACETAMINOPHEN 7.5-325 MG PO TABS: 1.0000 | ORAL_TABLET | Freq: Once | ORAL | Status: DC | PRN

## 2018-06-01 MED ORDER — MAGNESIUM OXIDE 400 (241.3 MG) MG PO TABS: 400.0000 mg | ORAL_TABLET | Freq: Two times a day (BID) | ORAL | Status: DC

## 2018-06-01 MED ORDER — MAGNESIUM SULFATE 2 GM/50ML IV SOLN
2.0000 g | Freq: Once | INTRAVENOUS | Status: AC
  Administered 2018-06-01: 12:00:00 2 g via INTRAVENOUS
  Filled 2018-06-01: qty 50

## 2018-06-01 MED ORDER — PROPOFOL 500 MG/50ML IV EMUL
INTRAVENOUS | Status: DC | PRN
  Administered 2018-06-01: 150 ug/kg/min via INTRAVENOUS
  Administered 2018-06-01: 09:00:00 via INTRAVENOUS

## 2018-06-01 NOTE — Op Note (Signed)
[facility] Patient Name: Darren Howard Procedure Date: 06/01/2018 7:42 AM MRN: [id number] Date of Birth: 1950-06-20 Attending MD: Norvel Richards , MD CSN: [id number] Age: 68 Admit Type: Outpatient Procedure:                Colonoscopy Indications:              Hematochezia Providers:                Norvel Richards, MD, Jeanann Lewandowsky. Sharon Seller, RN,                            Aram Candela Referring MD:              Medicines:                Propofol per Anesthesia Complications:            No immediate complications. Estimated Blood Loss:     Estimated blood loss was minimal. Procedure:                Pre-Anesthesia Assessment:                           - Prior to the procedure, a History and Physical                            was performed, and patient medications and                            allergies were reviewed. The patient's tolerance of                            previous anesthesia was also reviewed. The risks                            and benefits of the procedure and the sedation                            options and risks were discussed with the patient.                            All questions were answered, and informed consent                            was obtained. Prior Anticoagulants: The patient has                            taken no previous anticoagulant or antiplatelet                            agents. ASA Grade Assessment: III - A patient with                            severe systemic disease. After reviewing the risks  and benefits, the patient was deemed in                            satisfactory condition to undergo the procedure.                           After obtaining informed consent, the colonoscope                            was passed under direct vision. Throughout the                            procedure, the patient's blood pressure, pulse, and                            oxygen saturations were  monitored continuously. The                            CF-[id number] ([id number]) scope was introduced through                            the anus and advanced to the the cecum, identified                            by appendiceal orifice and ileocecal valve. The                            colonoscopy was performed without difficulty. The                            patient tolerated the procedure well. The quality                            of the bowel preparation was inadequate. Scope In: 8:59:53 AM Scope Out: 9:23:00 AM Scope Withdrawal Time: 0 hours 17 minutes 26 seconds  Total Procedure Duration: 0 hours 23 minutes 7 seconds  Findings:      Hemorrhoids were found on perianal exam. Multiple external hemorrhoid       tags with associated anal papilla present. Friable - easily bled with       manipulation. Internal hemorrhoids also present.      Two sessile polyps were found in the hepatic flexure. The polyps were 4       to 6 mm in size. They were cold snare removed. 18 mm semi-pedunculated       polyp opposite the ileocecal valve was piecemeal removed with a hot       snare technique. It was felt to been completely removed.      Poor prep throughout the transverse and ascending segment make complete       examination difficult. Distal 10 cm of terminal ileum appeared normal.       There was no blood in the more proximal lower GI tract. Impression:               - Preparation of the colon was inadequate.                           -  Hemorrhoids found on perianal exam.                           - Two 4 to 6 mm polyps at the hepatic flexure,                            removed with a cold snare. Resected and retrieved.                           - One 18 mm polyp at the ileocecal valve, removed                            piecemeal using a hot snare. Resected and retrieved.                           - Bleeding external and internal hemorrhoids.                           - The examination was  otherwise normal on direct                            and retroflexion views. With anorectal discomfort,                            coexisting anal fissure not excluded. Moderate Sedation:      Moderate (conscious) sedation was personally administered by an       anesthesia professional. The following parameters were monitored: oxygen       saturation, heart rate, blood pressure, respiratory rate, EKG, adequacy       of pulmonary ventilation, and response to care. Recommendation:           - Return patient to hospital ward for ongoing care.                           - Diabetic (ADA) diet.                           - Continue present medications. Avoid all                            nonsteroidal agents.                           - Repeat colonoscopy in 6 months for surveillance                            after piecemeal polypectomy.                           - Return to GI office in 6 months. Anusol cream to                            the anorectum every 6 hours x10 days. Recommend  nitroglycerin ointment 0.125% -compounded at                            [facility]) apply pea-sized amount to the                            anorectum every 6 hours in between Anusol topically                            4 times daily. 30 g supply should be adequate with                            no refills -empirically for possible coexisting                            anal fissure. Follow-up on pathology.                           , Patient will need a repeat colonoscopy in 6                            months in the setting of good preparation. From a                            GI standpoint, patient could be discharged later                            today. Procedure Code(s):        --- Professional ---                           [phone], Colonoscopy, flexible; with removal of                            tumor(s), polyp(s), or other lesion(s) by snare                             technique Diagnosis Code(s):        --- Professional ---                           K64.3, Fourth degree hemorrhoids                           K63.5, Polyp of colon                           K92.1, Melena (includes Hematochezia) CPT copyright 2019 American Medical Association. All rights reserved. The codes documented in this report are preliminary and upon coder review may  be revised to meet current compliance requirements. Cristopher Estimable. Rourk, MD Norvel Richards, MD 06/01/2018 9:38:37 AM This report has been signed electronically. Number of Addenda: 0

## 2018-06-01 NOTE — Anesthesia Preprocedure Evaluation (Addendum)
Anesthesia Evaluation    Reviewed: reviewed documented beta blocker date and time   Airway Mallampati: III  TM Distance: >3 FB Neck ROM: Full    Dental no notable dental hx. (+) Teeth Intact   Pulmonary Current Smoker,    Pulmonary exam normal breath sounds clear to auscultation       Cardiovascular Exercise Tolerance: Good hypertension, Pt. on medications and Pt. on home beta blockers Normal cardiovascular examI Rhythm:Regular Rate:Normal     Neuro/Psych PSYCHIATRIC DISORDERS ? 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  No acute findings    GI/Hepatic PUD, On Meds  Denies any GERD Sx   Endo/Other  diabetes, Type 2  Renal/GU      Musculoskeletal   Abdominal   Peds  Hematology  (+) anemia ,   Anesthesia Other Findings Pt reports h/o  New B LE swelling S/p recent EGD   Reproductive/Obstetrics                           Anesthesia Physical Anesthesia Plan  ASA: III  Anesthesia Plan: General   Post-op Pain Management:    Induction: Intravenous  PONV Risk Score and Plan:   Airway Management Planned: Nasal Cannula and Simple Face Mask  Additional Equipment:   Intra-op Plan:   Post-operative Plan: Extubation in OR  Informed Consent: I have reviewed the patients History and Physical, chart, labs and discussed the procedure including the risks, benefits and alternatives for the proposed anesthesia with the patient or authorized representative who has indicated his/her understanding and acceptance.     Dental advisory given  Plan Discussed with: CRNA  Anesthesia Plan Comments: (Full PPE use planned  GA planned, GETA as needed d/w pt -WTP with same )        Anesthesia Quick Evaluation

## 2018-06-01 NOTE — H&P (Signed)
  Patient seen and examined in short stay.  Hemoglobin 7.6 this morning.  He continues to report intermittent low volume rectal bleeding.  Patient did okay with prep.  Colonoscopy this morning per plan.   The risks, benefits, limitations, alternatives and imponderables have been reviewed with the patient. Questions have been answered. All parties are agreeable.   Further recommendations to follow.

## 2018-06-01 NOTE — Transfer of Care (Signed)
Immediate Anesthesia Transfer of Care Note  Patient: Darren Howard  Procedure(s) Performed: COLONOSCOPY WITH PROPOFOL (N/A ) POLYPECTOMY  Patient Location: PACU  Anesthesia Type:General  Level of Consciousness: awake  Airway & Oxygen Therapy: Patient Spontanous Breathing  Post-op Assessment: Report given to RN  Post vital signs: Reviewed  Last Vitals:  Vitals Value Taken Time  BP 128/66 06/01/2018  9:33 AM  Temp    Pulse 68 06/01/2018  9:36 AM  Resp 13 06/01/2018  9:36 AM  SpO2 100 % 06/01/2018  9:36 AM  Vitals shown include unvalidated device data.  Last Pain:  Vitals:   06/01/18 0854  TempSrc:   PainSc: 0-No pain      Patients Stated Pain Goal: 8 (03/30/13 5208)  Complications: No apparent anesthesia complications

## 2018-06-01 NOTE — Discharge Summary (Addendum)
Physician Discharge Summary  Darren Howard YYT:[id number] DOB: 10-17-1950 DOA: 05/27/2018  PCP: [name], MD  Admit date: 05/27/2018 Discharge date: 06/01/2018  Admitted From: SNF Disposition:  SNF  Recommendations for Outpatient Follow-up:  1. Follow up with PCP in 1-2 weeks 2. Please obtain BMP/CBC in one week 3. Please obtain nitroglycerin 0.125% ointment at [facility]--apply peas sized amount to anorectum every 6 hours in between applications of Anusol.  Use for 10 days    Discharge Condition: Stable CODE STATUS: FULL Diet recommendation: Heart Healthy   Brief/Interim Summary: 68 year old male with a history of GI bleed secondary to NSAIDs, hypertension, diabetes mellitus type 2, hyperlipidemia, alcohol abuse in remission, tobacco abuse in remission presenting with low hemoglobin on routine blood work. The patient was recently mated to the hospital from 05/13/2018 through 05/18/2018 for an upper GI bleed. The patient had EGD on 05/17/2018 which revealed multiple erosions in the gastric antrum and duodenal erosions. This was felt to be due to NSAIDs. During that hospitalization, the patient was transfused 2 units PRBC and discharged with PPI. He was discharged to the [facility] in [place]. Since his discharge, the patient denies any further hematochezia, melena, fevers, chills, chest pain, nausea, vomiting, diarrhea. Apparently, the patient had a routine CBC done on 05/25/2018 or 05/26/2018 which revealed his hemoglobin to be 7.1. As result, the patient was transferred to emergency department for further evaluation. In the emergency department, the patient was noted to have hemoglobin of 7.7 which was essentially unchanged from his hemoglobin on the day of discharge (7.8)on 05/17/1928. EDP spoke with GI who did not feel that the patient needed to be admitted for further GI work-up. The patient has a follow-up appointment with GI as an outpatient on  06/16/2018. However, because the patient had multiple electrolyte abnormalities, the patient was admitted for further treatment and evaluation.The patient was noted to have hypomagnesemia, hypocalcemia, and hypokalemia. These were repleted aggressively. In early am 05/30/18 and 05/31/18, the patient had an episode of hematochezia. As a result, his discharge was delayed to monitor his Hgb and clinical situation.  GI was reconsulted on 05/31/18 and colonoscopy was performed on 06/01/18.  See below.   Discharge Diagnoses:   Hypocalcemia -Patient presented with a corrected calcium 6.8>>8.5 -givennine grams ofcalcium gluconate -continuepo calcium carbonate -Intact PTH--47 -25 vitamin D--7.7--continued ergocalciferal -check phosphorus--3.0 -Check magnesium--1.1>>>1.8  Hypomagnesemia -give additional 2 g IV magnesium 4/13 -Start p.o. magnesium oxide-->increase to BID  Hypokalemia -Repleted -In part due to hypomagnesemia  Hematochezia -4/12 and 4/13 am--had episodes -delay discharge to monitor for continued bleeding and Hgb -start miralax and senna -re-consult GI>>>colonoscopy -06/01/18 colonoscopy--bleeding internal and external hemorrhoids; hepatic flexure and ileocecal polyps -GI recommends anusol cream to anorectum q 6 hours x 10 days -nitroglycerin 0.125% ointment--apply to anorectum q 6 hours x 10 days in between times of anusol-->obtained at [facility]  Anemia of chronic disease -05/13/2018 iron saturation 86%, ferritin 1005, E75 170 -0/17/49 folic acid 2.4 -continue po iron and folic acid  Diabetes mellitus type 2, controlled -04/23/2018 hemoglobin A1c 6.9 -NovoLog sliding scale -holding metformin--resume after d/c  Erosive gastropathy -continue PPI  Essential hypertension -Continue carvedilol  Hyperlipidemia -Continue statin  Alcohol abuse in remission -Continue thiamine  Tobacco abuse -Patient states that he quit  smoking 3 weeks prior to this admission   Discharge Instructions   Allergies as of 06/01/2018   No Known Allergies     Medication List    TAKE these medications  amLODipine 2.5 MG tablet Commonly known as:  NORVASC Take 4 tablets (10 mg total) by mouth daily. What changed:  medication strength   atorvastatin 10 MG tablet Commonly known as:  LIPITOR Take 10 mg by mouth at bedtime.   calcium carbonate 500 MG chewable tablet Commonly known as:  TUMS - dosed in mg elemental calcium Chew 2 tablets (400 mg of elemental calcium total) by mouth 2 (two) times daily with a meal.   carvedilol 25 MG tablet Commonly known as:  COREG Take 25 mg by mouth 2 (two) times daily.   ferrous sulfate 325 (65 FE) MG tablet Take 1 tablet (325 mg total) by mouth daily with breakfast. What changed:  when to take this   folic acid 1 MG tablet Commonly known as:  FOLVITE Take 1 tablet (1 mg total) by mouth daily for 30 days.   hydrocortisone 2.5 % rectal cream Commonly known as:  ANUSOL-HC Place rectally 3 (three) times daily. Apply to anorectum every 6 hours x 10 days   magnesium oxide 400 (241.3 Mg) MG tablet Commonly known as:  MAG-OX Take 1 tablet (400 mg total) by mouth 2 (two) times daily.   metFORMIN 500 MG 24 hr tablet Commonly known as:  GLUCOPHAGE-XR Take 500 mg by mouth 2 (two) times daily.   pantoprazole 40 MG tablet Commonly known as:  PROTONIX Take 1 tablet (40 mg total) by mouth daily for 30 days.   polyethylene glycol 17 g packet Commonly known as:  MIRALAX / GLYCOLAX Take 17 g by mouth daily. What changed:  Another medication with the same name was added. Make sure you understand how and when to take each.   polyethylene glycol 17 g packet Commonly known as:  MIRALAX / GLYCOLAX Take 17 g by mouth daily. What changed:  You were already taking a medication with the same name, and this prescription was added. Make sure you understand how and when to take each.     senna 8.6 MG Tabs tablet Commonly known as:  SENOKOT Take 1 tablet by mouth 2 (two) times daily. What changed:  Another medication with the same name was added. Make sure you understand how and when to take each.   senna 8.6 MG Tabs tablet Commonly known as:  SENOKOT Take 2 tablets (17.2 mg total) by mouth daily. What changed:  You were already taking a medication with the same name, and this prescription was added. Make sure you understand how and when to take each.   thiamine 100 MG tablet Take 1 tablet (100 mg total) by mouth daily for 30 days.   Vitamin D (Ergocalciferol) 1.25 MG (50000 UT) Caps capsule Commonly known as:  DRISDOL Take 50,000 Units by mouth every 7 (seven) days. What changed:  Another medication with the same name was added. Make sure you understand how and when to take each.   Vitamin D (Ergocalciferol) 1.25 MG (50000 UT) Caps capsule Commonly known as:  DRISDOL Take 1 capsule (50,000 Units total) by mouth every 7 (seven) days. Next dose 06/05/18 Start taking on:  June 05, 2018 What changed:  You were already taking a medication with the same name, and this prescription was added. Make sure you understand how and when to take each.      Contact information for after-discharge care    [facility] .   Service:  Skilled Chiropodist information: [address] [place] [phone]  No Known Allergies  Consultations:  [facility]   Procedures/Studies: Dg Chest 2 View  Result Date: 05/13/2018 CLINICAL DATA:  Cough EXAM: CHEST - 2 VIEW COMPARISON:  07/17/2006 FINDINGS: The heart size and mediastinal contours are within normal limits. Both lungs are clear. The visualized skeletal structures are unremarkable. IMPRESSION: No active cardiopulmonary disease. Electronically Signed   By: Franchot Gallo M.D.   On: 05/13/2018 14:22   Ct Head Wo Contrast  Result Date:  05/13/2018 CLINICAL DATA:  Weakness and dizziness for several weeks EXAM: CT HEAD WITHOUT CONTRAST TECHNIQUE: Contiguous axial images were obtained from the base of the skull through the vertex without intravenous contrast. COMPARISON:  None. FINDINGS: Brain: Mild atrophic changes and chronic white matter ischemic change is identified. No findings to suggest acute hemorrhage, acute infarction or space-occupying mass lesion seen. Few scattered lacunar infarcts are noted in the basal ganglia and thalami bilaterally. Vascular: No hyperdense vessel or unexpected calcification. Skull: Normal. Negative for fracture or focal lesion. Sinuses/Orbits: No acute finding. Other: None. IMPRESSION: Chronic atrophic and ischemic changes without acute abnormality. Electronically Signed   By: Inez Catalina M.D.   On: 05/13/2018 14:18   Mr Brain Wo Contrast  Result Date: 05/13/2018 CLINICAL DATA:  Three-week history of dizziness and gait disturbance. EXAM: MRI HEAD WITHOUT CONTRAST TECHNIQUE: Multiplanar, multiecho pulse sequences of the brain and surrounding structures were obtained without intravenous contrast. COMPARISON:  Head CT 05/13/2018 FINDINGS: Brain: Diffusion imaging does not show any acute or subacute infarction. The brainstem and cerebellum are normal. There is an old lacunar infarction in the right thalamus and in both basal ganglia and radiating white matter tracts. No large vessel territory infarction. No mass lesion, hemorrhage, hydrocephalus or extra-axial collection. Vascular: Major vessels at the base of the brain show flow. Skull and upper cervical spine: Negative Sinuses/Orbits: Clear/normal Other: None IMPRESSION: Motion degraded exam. No acute finding. 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 Electronically Signed   By: Nelson Chimes M.D.   On: 05/13/2018 15:20         Discharge Exam: Vitals:   06/01/18 0945 06/01/18 1001  BP: (!) 151/72 135/89   Pulse: 74 73  Resp: 14 16  Temp: 98 F (36.7 C) 98.1 F (36.7 C)  SpO2: 100% 100%   Vitals:   06/01/18 0717 06/01/18 0931 06/01/18 0945 06/01/18 1001  BP: 138/72 128/66 (!) 151/72 135/89  Pulse: (!) 59 71 74 73  Resp: (!) 21 16 14 16   Temp: 98.9 F (37.2 C) 97.9 F (36.6 C) 98 F (36.7 C) 98.1 F (36.7 C)  TempSrc: Oral     SpO2: 100% 100% 100% 100%  Weight:      Height:        General: Pt is alert, awake, not in acute distress Cardiovascular: RRR, S1/S2 +, no rubs, no gallops Respiratory: CTA bilaterally, no wheezing, no rhonchi Abdominal: Soft, NT, ND, bowel sounds + Extremities: no edema, no cyanosis   The results of significant diagnostics from this hospitalization (including imaging, microbiology, ancillary and laboratory) are listed below for reference.    Significant Diagnostic Studies: Dg Chest 2 View  Result Date: 05/13/2018 CLINICAL DATA:  Cough EXAM: CHEST - 2 VIEW COMPARISON:  07/17/2006 FINDINGS: The heart size and mediastinal contours are within normal limits. Both lungs are clear. The visualized skeletal structures are unremarkable. IMPRESSION: No active cardiopulmonary disease. Electronically Signed   By: Franchot Gallo M.D.   On: 05/13/2018 14:22  Ct Head Wo Contrast  Result Date: 05/13/2018 CLINICAL DATA:  Weakness and dizziness for several weeks EXAM: CT HEAD WITHOUT CONTRAST TECHNIQUE: Contiguous axial images were obtained from the base of the skull through the vertex without intravenous contrast. COMPARISON:  None. FINDINGS: Brain: Mild atrophic changes and chronic white matter ischemic change is identified. No findings to suggest acute hemorrhage, acute infarction or space-occupying mass lesion seen. Few scattered lacunar infarcts are noted in the basal ganglia and thalami bilaterally. Vascular: No hyperdense vessel or unexpected calcification. Skull: Normal. Negative for fracture or focal lesion. Sinuses/Orbits: No acute finding. Other: None.  IMPRESSION: Chronic atrophic and ischemic changes without acute abnormality. Electronically Signed   By: Inez Catalina M.D.   On: 05/13/2018 14:18   Mr Brain Wo Contrast  Result Date: 05/13/2018 CLINICAL DATA:  Three-week history of dizziness and gait disturbance. EXAM: MRI HEAD WITHOUT CONTRAST TECHNIQUE: Multiplanar, multiecho pulse sequences of the brain and surrounding structures were obtained without intravenous contrast. COMPARISON:  Head CT 05/13/2018 FINDINGS: Brain: Diffusion imaging does not show any acute or subacute infarction. The brainstem and cerebellum are normal. There is an old lacunar infarction in the right thalamus and in both basal ganglia and radiating white matter tracts. No large vessel territory infarction. No mass lesion, hemorrhage, hydrocephalus or extra-axial collection. Vascular: Major vessels at the base of the brain show flow. Skull and upper cervical spine: Negative Sinuses/Orbits: Clear/normal Other: None IMPRESSION: Motion degraded exam. No acute finding. 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 Electronically Signed   By: Nelson Chimes M.D.   On: 05/13/2018 15:20     Microbiology: Recent Results (from the past 240 hour(s))  MRSA PCR Screening     Status: None   Collection Time: 05/27/18  8:30 PM  Result Value Ref Range Status   MRSA by PCR NEGATIVE NEGATIVE Final    Comment:        The GeneXpert MRSA Assay (FDA approved for NASAL specimens only), is one component of a comprehensive MRSA colonization surveillance program. It is not intended to diagnose MRSA infection nor to guide or monitor treatment for MRSA infections. Performed at [facility], [address], [place], [place] [address]      Labs: Basic Metabolic Panel: Recent Labs  Lab 05/28/18 [phone] 05/28/18 1303 05/29/18 0615 05/30/18 0603 05/31/18 0527 05/31/18 0835 06/01/18 0503  NA 138  --  139 138 138  --  137  K 3.8  --  4.0 4.0 4.0  --   4.0  CL 113*  --  116* 114* 114*  --  113*  CO2 17*  --  15* 16* 18*  --  18*  GLUCOSE 129*  --  119* 116* 87  --  83  BUN 12  --  12 12 11   --  8  CREATININE 0.78  --  0.82 0.82 0.80  --  0.69  CALCIUM 5.6*  --  6.5* 7.0* 6.9*  --  7.0*  MG 1.6*  --  1.8 1.7  --  1.5* 1.6*  PHOS  --  3.0 3.3 3.6  --   --   --    Liver Function Tests: Recent Labs  Lab 05/28/18 0707 05/29/18 0615 05/30/18 0603 05/31/18 0527 06/01/18 0503  AST 40 43* 49* 48* 48*  ALT 25 23 27 27 27   ALKPHOS 101 98 107 109 104  BILITOT 1.3* 0.6 0.6 0.7 0.9  PROT 5.2* 5.0* 5.1* 4.9* 4.6*  ALBUMIN  2.1* 2.0* 2.1* 2.0* 1.9*   No results for input(s): LIPASE, AMYLASE in the last 168 hours. No results for input(s): AMMONIA in the last 168 hours. CBC: Recent Labs  Lab 05/27/18 1353 05/27/18 2231 05/28/18 0707 05/30/18 0603 05/31/18 0527 06/01/18 0503  WBC 8.7  --  7.1 6.7 6.2 6.3  NEUTROABS 6.5  --   --   --   --   --   HGB 7.7* 6.8* 8.1* 8.4* 8.0* 7.6*  HCT 23.6*  --  24.3* 26.3* 25.0* 23.2*  MCV 94.8  --  93.8 97.8 96.5 94.3  PLT 347  --  292 308 306 290   Cardiac Enzymes: No results for input(s): CKTOTAL, CKMB, CKMBINDEX, TROPONINI in the last 168 hours. BNP: Invalid input(s): POCBNP CBG: Recent Labs  Lab 05/31/18 1133 05/31/18 1617 05/31/18 2115 06/01/18 0714 06/01/18 0935  GLUCAP 103* 73 85 71 69*    Time coordinating discharge:  36 minutes  Signed:  Orson Eva, DO [facility] Pager: [phone] 06/01/2018, 11:23 AM

## 2018-06-01 NOTE — Progress Notes (Signed)
IV removed, patient tolerated well. Report called to [place] at [facility] in [place], [place] and all questions answered.  Patient to be transported to the [facility] by his son.

## 2018-06-01 NOTE — Anesthesia Postprocedure Evaluation (Signed)
Anesthesia Post Note  Patient: Darren Howard  Procedure(s) Performed: COLONOSCOPY WITH PROPOFOL (N/A ) POLYPECTOMY  Patient location during evaluation: PACU Anesthesia Type: General Level of consciousness: awake and alert and oriented Pain management: pain level controlled Vital Signs Assessment: post-procedure vital signs reviewed and stable Respiratory status: spontaneous breathing Cardiovascular status: blood pressure returned to baseline and stable Postop Assessment: no apparent nausea or vomiting Anesthetic complications: no     Last Vitals:  Vitals:   06/01/18 0541 06/01/18 0717  BP: 138/83 138/72  Pulse: 68 (!) 59  Resp:  (!) 21  Temp: 37.4 C 37.2 C  SpO2: 100% 100%    Last Pain:  Vitals:   06/01/18 0854  TempSrc:   PainSc: 0-No pain                 Yaretzy Olazabal

## 2018-06-02 ENCOUNTER — Telehealth: Payer: Self-pay

## 2018-06-02 ENCOUNTER — Encounter (HOSPITAL_COMMUNITY): Payer: Self-pay | Admitting: Internal Medicine

## 2018-06-02 ENCOUNTER — Encounter: Payer: Self-pay | Admitting: Internal Medicine

## 2018-06-02 NOTE — Telephone Encounter (Signed)
Per RMR-Send letter to patient.  Send copy of letter with path to referring provider and PCP.   Ov in 6 mos to set up tcs - need better prep next time.

## 2018-06-04 DIAGNOSIS — D649 Anemia, unspecified: Secondary | ICD-10-CM | POA: Diagnosis not present

## 2018-06-04 DIAGNOSIS — K648 Other hemorrhoids: Secondary | ICD-10-CM | POA: Diagnosis not present

## 2018-06-04 DIAGNOSIS — M6281 Muscle weakness (generalized): Secondary | ICD-10-CM | POA: Diagnosis not present

## 2018-06-04 DIAGNOSIS — K922 Gastrointestinal hemorrhage, unspecified: Secondary | ICD-10-CM | POA: Diagnosis not present

## 2018-06-04 NOTE — Telephone Encounter (Signed)
Manuela Schwartz, please see note from Vicente Males.

## 2018-06-04 NOTE — Telephone Encounter (Signed)
We can schedule OV in 6 months.

## 2018-06-04 NOTE — Telephone Encounter (Signed)
Reminder in epic to follow up in 6 months to repeat tcs per RMR. Patient also has a Telephone Visit scheduled with AB on 4/29. Does he still need this or do I need to cancel?

## 2018-06-04 NOTE — Telephone Encounter (Signed)
Darren Howard, this pt is scheduled a telephone visit with you to schedule a tcs  Since that visit was made, the pt was hospitalized and his tcs was done while an inpatient. (see note below from RMR)  Do you still want to do your telephone visit or can we cancel your telephone visit and schedule him for an OV in 6 months?

## 2018-06-07 NOTE — Telephone Encounter (Signed)
I've tried a couple of times to reach patient, but he doesn't have a VM set up for me to leave him a message

## 2018-06-08 ENCOUNTER — Encounter: Payer: Self-pay | Admitting: Internal Medicine

## 2018-06-08 NOTE — Telephone Encounter (Signed)
Mailed letter to patient to disregard OV in April and we will contact him in 6 months to schedule OV in OCT

## 2018-06-14 ENCOUNTER — Other Ambulatory Visit (HOSPITAL_COMMUNITY)
Admission: AD | Admit: 2018-06-14 | Discharge: 2018-06-14 | Disposition: A | Discharge status: Home / Self Care | Payer: Medicare Other | Source: Skilled Nursing Facility | Attending: Family Medicine | Admitting: Family Medicine

## 2018-06-14 DIAGNOSIS — D649 Anemia, unspecified: Secondary | ICD-10-CM | POA: Insufficient documentation

## 2018-06-14 DIAGNOSIS — D500 Iron deficiency anemia secondary to blood loss (chronic): Secondary | ICD-10-CM | POA: Insufficient documentation

## 2018-06-14 LAB — BASIC METABOLIC PANEL
Anion gap: 8 (ref 5–15)
BUN: 16 mg/dL (ref 8–23)
CO2: 22 mmol/L (ref 22–32)
Calcium: 7.8 mg/dL — ABNORMAL LOW (ref 8.9–10.3)
Chloride: 107 mmol/L (ref 98–111)
Creatinine, Ser: 0.99 mg/dL (ref 0.61–1.24)
GFR calc Af Amer: >60 mL/min (ref >60)
GFR calc non Af Amer: >60 mL/min (ref >60)
Glucose, Bld: 97 mg/dL (ref 70–99)
Potassium: 4.6 mmol/L (ref 3.5–5.1)
Sodium: 137 mmol/L (ref 135–145)

## 2018-06-14 LAB — BRAIN NATRIURETIC PEPTIDE: B Natriuretic Peptide: 220 pg/mL — ABNORMAL HIGH (ref 0.0–100.0)

## 2018-06-16 ENCOUNTER — Telehealth: Payer: Self-pay

## 2018-06-16 ENCOUNTER — Ambulatory Visit: Payer: Medicare Other | Admitting: Gastroenterology

## 2018-06-16 ENCOUNTER — Other Ambulatory Visit: Payer: Self-pay

## 2018-06-16 NOTE — Telephone Encounter (Signed)
Please cancel telephone visit that was for 06/16/18 (appt wasn't canceled, he needs OV in October per AB). See other phone note.

## 2018-06-17 ENCOUNTER — Encounter: Payer: Self-pay | Admitting: Gastroenterology

## 2018-06-17 NOTE — Telephone Encounter (Signed)
SCHEDULED FOR October AND LETTER SENT

## 2018-06-25 DIAGNOSIS — Z6825 Body mass index (BMI) 25.0-25.9, adult: Secondary | ICD-10-CM | POA: Diagnosis not present

## 2018-06-25 DIAGNOSIS — E1165 Type 2 diabetes mellitus with hyperglycemia: Secondary | ICD-10-CM | POA: Diagnosis not present

## 2018-06-25 DIAGNOSIS — Z0000 Encounter for general adult medical examination without abnormal findings: Secondary | ICD-10-CM | POA: Diagnosis not present

## 2018-06-25 DIAGNOSIS — E663 Overweight: Secondary | ICD-10-CM | POA: Diagnosis not present

## 2018-06-25 DIAGNOSIS — Z1389 Encounter for screening for other disorder: Secondary | ICD-10-CM | POA: Diagnosis not present

## 2018-06-25 DIAGNOSIS — K922 Gastrointestinal hemorrhage, unspecified: Secondary | ICD-10-CM | POA: Diagnosis not present

## 2018-07-13 DIAGNOSIS — I10 Essential (primary) hypertension: Secondary | ICD-10-CM | POA: Diagnosis not present

## 2018-07-13 DIAGNOSIS — E119 Type 2 diabetes mellitus without complications: Secondary | ICD-10-CM | POA: Diagnosis not present

## 2018-07-13 DIAGNOSIS — D500 Iron deficiency anemia secondary to blood loss (chronic): Secondary | ICD-10-CM | POA: Diagnosis not present

## 2018-07-20 DIAGNOSIS — Z6826 Body mass index (BMI) 26.0-26.9, adult: Secondary | ICD-10-CM | POA: Diagnosis not present

## 2018-07-20 DIAGNOSIS — E7849 Other hyperlipidemia: Secondary | ICD-10-CM | POA: Diagnosis not present

## 2018-07-20 DIAGNOSIS — K922 Gastrointestinal hemorrhage, unspecified: Secondary | ICD-10-CM | POA: Diagnosis not present

## 2018-07-20 DIAGNOSIS — L409 Psoriasis, unspecified: Secondary | ICD-10-CM | POA: Diagnosis not present

## 2018-09-02 ENCOUNTER — Ambulatory Visit: Payer: Medicare Other | Admitting: Gastroenterology

## 2018-10-07 DIAGNOSIS — L409 Psoriasis, unspecified: Secondary | ICD-10-CM | POA: Diagnosis not present

## 2018-10-07 DIAGNOSIS — Z6825 Body mass index (BMI) 25.0-25.9, adult: Secondary | ICD-10-CM | POA: Diagnosis not present

## 2018-10-07 DIAGNOSIS — G629 Polyneuropathy, unspecified: Secondary | ICD-10-CM | POA: Diagnosis not present

## 2018-10-07 DIAGNOSIS — I10 Essential (primary) hypertension: Secondary | ICD-10-CM | POA: Diagnosis not present

## 2018-10-07 DIAGNOSIS — E663 Overweight: Secondary | ICD-10-CM | POA: Diagnosis not present

## 2018-10-21 DIAGNOSIS — L723 Sebaceous cyst: Secondary | ICD-10-CM | POA: Diagnosis not present

## 2018-10-21 DIAGNOSIS — E7849 Other hyperlipidemia: Secondary | ICD-10-CM | POA: Diagnosis not present

## 2018-10-21 DIAGNOSIS — G64 Other disorders of peripheral nervous system: Secondary | ICD-10-CM | POA: Diagnosis not present

## 2018-10-21 DIAGNOSIS — E663 Overweight: Secondary | ICD-10-CM | POA: Diagnosis not present

## 2018-10-21 DIAGNOSIS — E119 Type 2 diabetes mellitus without complications: Secondary | ICD-10-CM | POA: Diagnosis not present

## 2018-10-21 DIAGNOSIS — Z6827 Body mass index (BMI) 27.0-27.9, adult: Secondary | ICD-10-CM | POA: Diagnosis not present

## 2018-12-01 ENCOUNTER — Telehealth: Payer: Self-pay | Admitting: Gastroenterology

## 2018-12-01 ENCOUNTER — Ambulatory Visit: Payer: Medicare Other | Admitting: Gastroenterology

## 2018-12-01 ENCOUNTER — Encounter: Payer: Self-pay | Admitting: Gastroenterology

## 2018-12-01 NOTE — Telephone Encounter (Signed)
Patient was a no show and letter sent  °

## 2019-05-19 DIAGNOSIS — E119 Type 2 diabetes mellitus without complications: Secondary | ICD-10-CM | POA: Diagnosis not present

## 2019-05-19 DIAGNOSIS — Z681 Body mass index (BMI) 19 or less, adult: Secondary | ICD-10-CM | POA: Diagnosis not present

## 2019-05-19 DIAGNOSIS — Z0001 Encounter for general adult medical examination with abnormal findings: Secondary | ICD-10-CM | POA: Diagnosis not present

## 2019-05-19 DIAGNOSIS — E1165 Type 2 diabetes mellitus with hyperglycemia: Secondary | ICD-10-CM | POA: Diagnosis not present

## 2019-06-04 ENCOUNTER — Other Ambulatory Visit: Payer: Self-pay

## 2019-06-04 ENCOUNTER — Encounter (HOSPITAL_COMMUNITY): Payer: Self-pay

## 2019-06-04 ENCOUNTER — Emergency Department (HOSPITAL_COMMUNITY): Payer: PPO

## 2019-06-04 ENCOUNTER — Inpatient Hospital Stay (HOSPITAL_COMMUNITY)
Admission: EM | Admit: 2019-06-04 | Discharge: 2019-06-20 | DRG: 392 | Disposition: A | Discharge status: Home Health | Payer: PPO | Attending: Internal Medicine | Admitting: Internal Medicine

## 2019-06-04 DIAGNOSIS — F1010 Alcohol abuse, uncomplicated: Secondary | ICD-10-CM | POA: Diagnosis present

## 2019-06-04 DIAGNOSIS — K5289 Other specified noninfective gastroenteritis and colitis: Secondary | ICD-10-CM | POA: Diagnosis present

## 2019-06-04 DIAGNOSIS — D500 Iron deficiency anemia secondary to blood loss (chronic): Secondary | ICD-10-CM | POA: Diagnosis present

## 2019-06-04 DIAGNOSIS — E785 Hyperlipidemia, unspecified: Secondary | ICD-10-CM | POA: Diagnosis present

## 2019-06-04 DIAGNOSIS — R197 Diarrhea, unspecified: Secondary | ICD-10-CM | POA: Diagnosis present

## 2019-06-04 DIAGNOSIS — D649 Anemia, unspecified: Secondary | ICD-10-CM | POA: Diagnosis not present

## 2019-06-04 DIAGNOSIS — N132 Hydronephrosis with renal and ureteral calculous obstruction: Secondary | ICD-10-CM | POA: Diagnosis not present

## 2019-06-04 DIAGNOSIS — R5381 Other malaise: Secondary | ICD-10-CM | POA: Diagnosis not present

## 2019-06-04 DIAGNOSIS — F17210 Nicotine dependence, cigarettes, uncomplicated: Secondary | ICD-10-CM | POA: Diagnosis present

## 2019-06-04 DIAGNOSIS — Z9119 Patient's noncompliance with other medical treatment and regimen: Secondary | ICD-10-CM

## 2019-06-04 DIAGNOSIS — K642 Third degree hemorrhoids: Secondary | ICD-10-CM | POA: Diagnosis present

## 2019-06-04 DIAGNOSIS — E876 Hypokalemia: Secondary | ICD-10-CM | POA: Diagnosis present

## 2019-06-04 DIAGNOSIS — R609 Edema, unspecified: Secondary | ICD-10-CM | POA: Diagnosis not present

## 2019-06-04 DIAGNOSIS — R109 Unspecified abdominal pain: Secondary | ICD-10-CM | POA: Diagnosis not present

## 2019-06-04 DIAGNOSIS — Z8249 Family history of ischemic heart disease and other diseases of the circulatory system: Secondary | ICD-10-CM

## 2019-06-04 DIAGNOSIS — N12 Tubulo-interstitial nephritis, not specified as acute or chronic: Secondary | ICD-10-CM | POA: Diagnosis present

## 2019-06-04 DIAGNOSIS — N182 Chronic kidney disease, stage 2 (mild): Secondary | ICD-10-CM | POA: Diagnosis present

## 2019-06-04 DIAGNOSIS — F10231 Alcohol dependence with withdrawal delirium: Secondary | ICD-10-CM | POA: Diagnosis present

## 2019-06-04 DIAGNOSIS — E1122 Type 2 diabetes mellitus with diabetic chronic kidney disease: Secondary | ICD-10-CM | POA: Diagnosis present

## 2019-06-04 DIAGNOSIS — E8809 Other disorders of plasma-protein metabolism, not elsewhere classified: Secondary | ICD-10-CM | POA: Diagnosis present

## 2019-06-04 DIAGNOSIS — N136 Pyonephrosis: Secondary | ICD-10-CM | POA: Diagnosis present

## 2019-06-04 DIAGNOSIS — E860 Dehydration: Secondary | ICD-10-CM | POA: Diagnosis not present

## 2019-06-04 DIAGNOSIS — D62 Acute posthemorrhagic anemia: Secondary | ICD-10-CM | POA: Diagnosis present

## 2019-06-04 DIAGNOSIS — R933 Abnormal findings on diagnostic imaging of other parts of digestive tract: Secondary | ICD-10-CM | POA: Diagnosis present

## 2019-06-04 DIAGNOSIS — A419 Sepsis, unspecified organism: Secondary | ICD-10-CM

## 2019-06-04 DIAGNOSIS — K529 Noninfective gastroenteritis and colitis, unspecified: Secondary | ICD-10-CM | POA: Diagnosis present

## 2019-06-04 DIAGNOSIS — N179 Acute kidney failure, unspecified: Secondary | ICD-10-CM | POA: Diagnosis present

## 2019-06-04 DIAGNOSIS — Z7984 Long term (current) use of oral hypoglycemic drugs: Secondary | ICD-10-CM

## 2019-06-04 DIAGNOSIS — K7011 Alcoholic hepatitis with ascites: Secondary | ICD-10-CM | POA: Diagnosis present

## 2019-06-04 DIAGNOSIS — D631 Anemia in chronic kidney disease: Secondary | ICD-10-CM | POA: Diagnosis present

## 2019-06-04 DIAGNOSIS — I129 Hypertensive chronic kidney disease with stage 1 through stage 4 chronic kidney disease, or unspecified chronic kidney disease: Secondary | ICD-10-CM | POA: Diagnosis present

## 2019-06-04 DIAGNOSIS — E872 Acidosis: Secondary | ICD-10-CM | POA: Diagnosis present

## 2019-06-04 DIAGNOSIS — M7989 Other specified soft tissue disorders: Secondary | ICD-10-CM | POA: Diagnosis present

## 2019-06-04 DIAGNOSIS — I10 Essential (primary) hypertension: Secondary | ICD-10-CM | POA: Diagnosis present

## 2019-06-04 DIAGNOSIS — Z20822 Contact with and (suspected) exposure to covid-19: Secondary | ICD-10-CM | POA: Diagnosis present

## 2019-06-04 DIAGNOSIS — A09 Infectious gastroenteritis and colitis, unspecified: Principal | ICD-10-CM | POA: Diagnosis present

## 2019-06-04 DIAGNOSIS — R000 Tachycardia, unspecified: Secondary | ICD-10-CM | POA: Diagnosis not present

## 2019-06-04 DIAGNOSIS — R339 Retention of urine, unspecified: Secondary | ICD-10-CM | POA: Diagnosis present

## 2019-06-04 DIAGNOSIS — N1330 Unspecified hydronephrosis: Secondary | ICD-10-CM | POA: Diagnosis not present

## 2019-06-04 DIAGNOSIS — K5100 Ulcerative (chronic) pancolitis without complications: Secondary | ICD-10-CM | POA: Diagnosis not present

## 2019-06-04 DIAGNOSIS — Z79899 Other long term (current) drug therapy: Secondary | ICD-10-CM

## 2019-06-04 DIAGNOSIS — Z86010 Personal history of colonic polyps: Secondary | ICD-10-CM

## 2019-06-04 DIAGNOSIS — E119 Type 2 diabetes mellitus without complications: Secondary | ICD-10-CM

## 2019-06-04 DIAGNOSIS — K760 Fatty (change of) liver, not elsewhere classified: Secondary | ICD-10-CM | POA: Diagnosis present

## 2019-06-04 DIAGNOSIS — R600 Localized edema: Secondary | ICD-10-CM | POA: Diagnosis present

## 2019-06-04 DIAGNOSIS — Z720 Tobacco use: Secondary | ICD-10-CM | POA: Diagnosis not present

## 2019-06-04 LAB — CBC WITH DIFFERENTIAL/PLATELET
Abs Immature Granulocytes: 0.03 10*3/uL (ref 0.00–0.07)
Basophils Absolute: 0 10*3/uL (ref 0.0–0.1)
Basophils Relative: 0 %
Eosinophils Absolute: 0 10*3/uL (ref 0.0–0.5)
Eosinophils Relative: 0 %
HCT: 23.1 % — ABNORMAL LOW (ref 39.0–52.0)
Hemoglobin: 7.6 g/dL — ABNORMAL LOW (ref 13.0–17.0)
Immature Granulocytes: 0 %
Lymphocytes Relative: 13 %
Lymphs Abs: 1.2 10*3/uL (ref 0.7–4.0)
MCH: 30.9 pg (ref 26.0–34.0)
MCHC: 32.9 g/dL (ref 30.0–36.0)
MCV: 93.9 fL (ref 80.0–100.0)
Monocytes Absolute: 0.5 10*3/uL (ref 0.1–1.0)
Monocytes Relative: 6 %
Neutro Abs: 7.9 10*3/uL — ABNORMAL HIGH (ref 1.7–7.7)
Neutrophils Relative %: 81 %
Platelets: 387 10*3/uL (ref 150–400)
RBC: 2.46 MIL/uL — ABNORMAL LOW (ref 4.22–5.81)
RDW: 18.6 % — ABNORMAL HIGH (ref 11.5–15.5)
WBC: 9.7 10*3/uL (ref 4.0–10.5)
nRBC: 0 % (ref 0.0–0.2)

## 2019-06-04 LAB — COMPREHENSIVE METABOLIC PANEL
ALT: 32 U/L (ref 0–44)
AST: 43 U/L — ABNORMAL HIGH (ref 15–41)
Albumin: 2.6 g/dL — ABNORMAL LOW (ref 3.5–5.0)
Alkaline Phosphatase: 115 U/L (ref 38–126)
Anion gap: 10 (ref 5–15)
BUN: 29 mg/dL — ABNORMAL HIGH (ref 8–23)
CO2: 15 mmol/L — ABNORMAL LOW (ref 22–32)
Calcium: 6.4 mg/dL — CL (ref 8.9–10.3)
Chloride: 114 mmol/L — ABNORMAL HIGH (ref 98–111)
Creatinine, Ser: 1.38 mg/dL — ABNORMAL HIGH (ref 0.61–1.24)
GFR calc Af Amer: >60 mL/min (ref >60)
GFR calc non Af Amer: 53 mL/min — ABNORMAL LOW (ref >60)
Glucose, Bld: 124 mg/dL — ABNORMAL HIGH (ref 70–99)
Potassium: 2.4 mmol/L — CL (ref 3.5–5.1)
Sodium: 139 mmol/L (ref 135–145)
Total Bilirubin: 1 mg/dL (ref 0.3–1.2)
Total Protein: 6 g/dL — ABNORMAL LOW (ref 6.5–8.1)

## 2019-06-04 LAB — BRAIN NATRIURETIC PEPTIDE: B Natriuretic Peptide: 292 pg/mL — ABNORMAL HIGH (ref 0.0–100.0)

## 2019-06-04 LAB — LACTIC ACID, PLASMA: Lactic Acid, Venous: 2.7 mmol/L (ref 0.5–1.9)

## 2019-06-04 LAB — LIPASE, BLOOD: Lipase: 12 U/L (ref 11–51)

## 2019-06-04 LAB — ETHANOL: Alcohol, Ethyl (B): <10 mg/dL (ref <10)

## 2019-06-04 LAB — PROTIME-INR
INR: 1.2 (ref 0.8–1.2)
Prothrombin Time: 15.3 seconds — ABNORMAL HIGH (ref 11.4–15.2)

## 2019-06-04 LAB — POC OCCULT BLOOD, ED: Fecal Occult Bld: NEGATIVE

## 2019-06-04 MED ORDER — METRONIDAZOLE IN NACL 5-0.79 MG/ML-% IV SOLN
500.0000 mg | Freq: Once | INTRAVENOUS | Status: AC
  Administered 2019-06-04: 500 mg via INTRAVENOUS
  Filled 2019-06-04: qty 100

## 2019-06-04 MED ORDER — POTASSIUM CHLORIDE CRYS ER 20 MEQ PO TBCR
40.0000 meq | EXTENDED_RELEASE_TABLET | Freq: Once | ORAL | Status: AC
  Administered 2019-06-04: 40 meq via ORAL
  Filled 2019-06-04: qty 2

## 2019-06-04 MED ORDER — LORAZEPAM 2 MG/ML IJ SOLN: 0.0000 mg | Freq: Four times a day (QID) | INTRAMUSCULAR | Status: DC

## 2019-06-04 MED ORDER — SODIUM CHLORIDE 0.9 % IV SOLN
2.0000 g | Freq: Once | INTRAVENOUS | Status: AC
  Administered 2019-06-04: 2 g via INTRAVENOUS
  Filled 2019-06-04: qty 2

## 2019-06-04 MED ORDER — THIAMINE HCL 100 MG/ML IJ SOLN
100.0000 mg | Freq: Every day | INTRAMUSCULAR | Status: DC
  Filled 2019-06-04 (×2): qty 2

## 2019-06-04 MED ORDER — LORAZEPAM 2 MG/ML IJ SOLN: 0.0000 mg | Freq: Two times a day (BID) | INTRAMUSCULAR | Status: DC

## 2019-06-04 MED ORDER — THIAMINE HCL 100 MG/ML IJ SOLN
100.0000 mg | Freq: Every day | INTRAMUSCULAR | Status: DC
  Administered 2019-06-05: 100 mg via INTRAVENOUS
  Filled 2019-06-04: qty 2

## 2019-06-04 MED ORDER — CALCIUM GLUCONATE-NACL 1-0.675 GM/50ML-% IV SOLN
1.0000 g | Freq: Once | INTRAVENOUS | Status: AC
  Administered 2019-06-04: 1000 mg via INTRAVENOUS
  Filled 2019-06-04: qty 50

## 2019-06-04 MED ORDER — VANCOMYCIN HCL IN DEXTROSE 1-5 GM/200ML-% IV SOLN: 1000.0000 mg | Freq: Once | INTRAVENOUS | Status: DC

## 2019-06-04 MED ORDER — POTASSIUM CHLORIDE 10 MEQ/100ML IV SOLN
10.0000 meq | INTRAVENOUS | Status: AC
  Administered 2019-06-04 – 2019-06-05 (×4): 10 meq via INTRAVENOUS
  Filled 2019-06-04 (×4): qty 100

## 2019-06-04 MED ORDER — IOHEXOL 300 MG/ML  SOLN
100.0000 mL | Freq: Once | INTRAMUSCULAR | Status: AC | PRN
  Administered 2019-06-04: 100 mL via INTRAVENOUS

## 2019-06-04 MED ORDER — LORAZEPAM 1 MG PO TABS: 0.0000 mg | ORAL_TABLET | Freq: Two times a day (BID) | ORAL | Status: DC

## 2019-06-04 MED ORDER — LORAZEPAM 1 MG PO TABS
0.0000 mg | ORAL_TABLET | Freq: Four times a day (QID) | ORAL | Status: DC
  Administered 2019-06-04: 2 mg via ORAL
  Filled 2019-06-04: qty 2

## 2019-06-04 MED ORDER — VANCOMYCIN HCL 2000 MG/400ML IV SOLN
2000.0000 mg | Freq: Once | INTRAVENOUS | Status: AC
  Administered 2019-06-04: 2000 mg via INTRAVENOUS
  Filled 2019-06-04: qty 400

## 2019-06-04 MED ORDER — THIAMINE HCL 100 MG PO TABS
100.0000 mg | ORAL_TABLET | Freq: Every day | ORAL | Status: DC
  Administered 2019-06-04 – 2019-06-20 (×17): 100 mg via ORAL
  Filled 2019-06-04 (×17): qty 1

## 2019-06-04 NOTE — ED Notes (Signed)
Pt has elevated lactic of 2.7  Hx of liver dx  Lab at bedside do you want an ammonia on this pt

## 2019-06-04 NOTE — ED Notes (Signed)
Pt is noncompliant by his own report of "too lazy" to take his medication   Drinks daily   Here for evaluation of bilateral leg swelling

## 2019-06-04 NOTE — ED Triage Notes (Addendum)
Pt from home via ems for bi lat leg swelling and trouble with his bowels x1 week.  states he has not been taking h is medications because he is too lazy. Educated that he needs to takes his medications .  "couple" shots daily drinker  Used to be pint/day

## 2019-06-04 NOTE — ED Notes (Signed)
Unable to get repeat EKG due to tremors and artifact.

## 2019-06-04 NOTE — ED Notes (Signed)
Pt reports he has had swollen legs x 1 month   Is urinating on himself   His legs go up and down  Up when he fails to take his meds   Down when he take it

## 2019-06-04 NOTE — ED Notes (Signed)
PA in to evaluate  

## 2019-06-05 ENCOUNTER — Observation Stay (HOSPITAL_COMMUNITY): Payer: PPO

## 2019-06-05 ENCOUNTER — Encounter (HOSPITAL_COMMUNITY): Payer: Self-pay | Admitting: Internal Medicine

## 2019-06-05 DIAGNOSIS — A09 Infectious gastroenteritis and colitis, unspecified: Secondary | ICD-10-CM | POA: Diagnosis present

## 2019-06-05 DIAGNOSIS — R933 Abnormal findings on diagnostic imaging of other parts of digestive tract: Secondary | ICD-10-CM | POA: Diagnosis not present

## 2019-06-05 DIAGNOSIS — D631 Anemia in chronic kidney disease: Secondary | ICD-10-CM | POA: Diagnosis present

## 2019-06-05 DIAGNOSIS — K529 Noninfective gastroenteritis and colitis, unspecified: Secondary | ICD-10-CM | POA: Diagnosis not present

## 2019-06-05 DIAGNOSIS — K760 Fatty (change of) liver, not elsewhere classified: Secondary | ICD-10-CM | POA: Diagnosis present

## 2019-06-05 DIAGNOSIS — E872 Acidosis: Secondary | ICD-10-CM | POA: Diagnosis present

## 2019-06-05 DIAGNOSIS — E876 Hypokalemia: Secondary | ICD-10-CM | POA: Diagnosis present

## 2019-06-05 DIAGNOSIS — D500 Iron deficiency anemia secondary to blood loss (chronic): Secondary | ICD-10-CM | POA: Diagnosis present

## 2019-06-05 DIAGNOSIS — I129 Hypertensive chronic kidney disease with stage 1 through stage 4 chronic kidney disease, or unspecified chronic kidney disease: Secondary | ICD-10-CM | POA: Diagnosis present

## 2019-06-05 DIAGNOSIS — Z720 Tobacco use: Secondary | ICD-10-CM | POA: Diagnosis present

## 2019-06-05 DIAGNOSIS — F1010 Alcohol abuse, uncomplicated: Secondary | ICD-10-CM | POA: Diagnosis not present

## 2019-06-05 DIAGNOSIS — R197 Diarrhea, unspecified: Secondary | ICD-10-CM | POA: Diagnosis not present

## 2019-06-05 DIAGNOSIS — Z9119 Patient's noncompliance with other medical treatment and regimen: Secondary | ICD-10-CM | POA: Diagnosis not present

## 2019-06-05 DIAGNOSIS — K7011 Alcoholic hepatitis with ascites: Secondary | ICD-10-CM | POA: Diagnosis present

## 2019-06-05 DIAGNOSIS — M7989 Other specified soft tissue disorders: Secondary | ICD-10-CM | POA: Diagnosis present

## 2019-06-05 DIAGNOSIS — N182 Chronic kidney disease, stage 2 (mild): Secondary | ICD-10-CM | POA: Diagnosis present

## 2019-06-05 DIAGNOSIS — R600 Localized edema: Secondary | ICD-10-CM | POA: Diagnosis present

## 2019-06-05 DIAGNOSIS — F17210 Nicotine dependence, cigarettes, uncomplicated: Secondary | ICD-10-CM | POA: Diagnosis present

## 2019-06-05 DIAGNOSIS — R109 Unspecified abdominal pain: Secondary | ICD-10-CM | POA: Diagnosis not present

## 2019-06-05 DIAGNOSIS — K5289 Other specified noninfective gastroenteritis and colitis: Secondary | ICD-10-CM | POA: Diagnosis present

## 2019-06-05 DIAGNOSIS — F10231 Alcohol dependence with withdrawal delirium: Secondary | ICD-10-CM | POA: Diagnosis present

## 2019-06-05 DIAGNOSIS — N12 Tubulo-interstitial nephritis, not specified as acute or chronic: Secondary | ICD-10-CM | POA: Diagnosis not present

## 2019-06-05 DIAGNOSIS — K642 Third degree hemorrhoids: Secondary | ICD-10-CM | POA: Diagnosis present

## 2019-06-05 DIAGNOSIS — E1122 Type 2 diabetes mellitus with diabetic chronic kidney disease: Secondary | ICD-10-CM | POA: Diagnosis present

## 2019-06-05 DIAGNOSIS — Z20822 Contact with and (suspected) exposure to covid-19: Secondary | ICD-10-CM | POA: Diagnosis present

## 2019-06-05 DIAGNOSIS — N179 Acute kidney failure, unspecified: Secondary | ICD-10-CM | POA: Diagnosis present

## 2019-06-05 DIAGNOSIS — D649 Anemia, unspecified: Secondary | ICD-10-CM | POA: Diagnosis not present

## 2019-06-05 DIAGNOSIS — E8809 Other disorders of plasma-protein metabolism, not elsewhere classified: Secondary | ICD-10-CM | POA: Diagnosis present

## 2019-06-05 DIAGNOSIS — E785 Hyperlipidemia, unspecified: Secondary | ICD-10-CM | POA: Diagnosis present

## 2019-06-05 DIAGNOSIS — N136 Pyonephrosis: Secondary | ICD-10-CM | POA: Diagnosis present

## 2019-06-05 LAB — URINALYSIS, ROUTINE W REFLEX MICROSCOPIC
Bilirubin Urine: NEGATIVE
Glucose, UA: NEGATIVE mg/dL
Hgb urine dipstick: NEGATIVE
Ketones, ur: NEGATIVE mg/dL
Leukocytes,Ua: NEGATIVE
Nitrite: NEGATIVE
Protein, ur: NEGATIVE mg/dL
Specific Gravity, Urine: 1.017 (ref 1.005–1.030)
pH: 5 (ref 5.0–8.0)

## 2019-06-05 LAB — CBC
HCT: 23.2 % — ABNORMAL LOW (ref 39.0–52.0)
Hemoglobin: 7.5 g/dL — ABNORMAL LOW (ref 13.0–17.0)
MCH: 30.9 pg (ref 26.0–34.0)
MCHC: 32.3 g/dL (ref 30.0–36.0)
MCV: 95.5 fL (ref 80.0–100.0)
Platelets: 362 10*3/uL (ref 150–400)
RBC: 2.43 MIL/uL — ABNORMAL LOW (ref 4.22–5.81)
RDW: 18.9 % — ABNORMAL HIGH (ref 11.5–15.5)
WBC: 11 10*3/uL — ABNORMAL HIGH (ref 4.0–10.5)
nRBC: 0 % (ref 0.0–0.2)

## 2019-06-05 LAB — COMPREHENSIVE METABOLIC PANEL
ALT: 32 U/L (ref 0–44)
AST: 47 U/L — ABNORMAL HIGH (ref 15–41)
Albumin: 2.6 g/dL — ABNORMAL LOW (ref 3.5–5.0)
Alkaline Phosphatase: 114 U/L (ref 38–126)
Anion gap: 10 (ref 5–15)
BUN: 26 mg/dL — ABNORMAL HIGH (ref 8–23)
CO2: 15 mmol/L — ABNORMAL LOW (ref 22–32)
Calcium: 6.5 mg/dL — ABNORMAL LOW (ref 8.9–10.3)
Chloride: 116 mmol/L — ABNORMAL HIGH (ref 98–111)
Creatinine, Ser: 1.28 mg/dL — ABNORMAL HIGH (ref 0.61–1.24)
GFR calc Af Amer: >60 mL/min (ref >60)
GFR calc non Af Amer: 58 mL/min — ABNORMAL LOW (ref >60)
Glucose, Bld: 123 mg/dL — ABNORMAL HIGH (ref 70–99)
Potassium: 2.7 mmol/L — CL (ref 3.5–5.1)
Sodium: 141 mmol/L (ref 135–145)
Total Bilirubin: 1 mg/dL (ref 0.3–1.2)
Total Protein: 6.1 g/dL — ABNORMAL LOW (ref 6.5–8.1)

## 2019-06-05 LAB — BASIC METABOLIC PANEL
Anion gap: 7 (ref 5–15)
BUN: 24 mg/dL — ABNORMAL HIGH (ref 8–23)
CO2: 16 mmol/L — ABNORMAL LOW (ref 22–32)
Calcium: 6.5 mg/dL — ABNORMAL LOW (ref 8.9–10.3)
Chloride: 120 mmol/L — ABNORMAL HIGH (ref 98–111)
Creatinine, Ser: 1.16 mg/dL (ref 0.61–1.24)
GFR calc Af Amer: >60 mL/min (ref >60)
GFR calc non Af Amer: >60 mL/min (ref >60)
Glucose, Bld: 127 mg/dL — ABNORMAL HIGH (ref 70–99)
Potassium: 2.8 mmol/L — ABNORMAL LOW (ref 3.5–5.1)
Sodium: 143 mmol/L (ref 135–145)

## 2019-06-05 LAB — PHOSPHORUS: Phosphorus: 3.8 mg/dL (ref 2.5–4.6)

## 2019-06-05 LAB — RAPID URINE DRUG SCREEN, HOSP PERFORMED
Amphetamines: NOT DETECTED
Barbiturates: NOT DETECTED
Benzodiazepines: NOT DETECTED
Cocaine: NOT DETECTED
Opiates: NOT DETECTED
Tetrahydrocannabinol: NOT DETECTED

## 2019-06-05 LAB — MAGNESIUM: Magnesium: 1.3 mg/dL — ABNORMAL LOW (ref 1.7–2.4)

## 2019-06-05 LAB — IRON AND TIBC
Iron: 17 ug/dL — ABNORMAL LOW (ref 45–182)
Saturation Ratios: 13 % — ABNORMAL LOW (ref 17.9–39.5)
TIBC: 127 ug/dL — ABNORMAL LOW (ref 250–450)
UIBC: 110 ug/dL

## 2019-06-05 LAB — ECHOCARDIOGRAM COMPLETE
Height: 73 in
Weight: 3280 oz

## 2019-06-05 LAB — HIV ANTIBODY (ROUTINE TESTING W REFLEX): HIV Screen 4th Generation wRfx: NONREACTIVE

## 2019-06-05 LAB — RETICULOCYTES
Immature Retic Fract: 28 % — ABNORMAL HIGH (ref 2.3–15.9)
RBC.: 2.43 MIL/uL — ABNORMAL LOW (ref 4.22–5.81)
Retic Count, Absolute: 46.2 10*3/uL (ref 19.0–186.0)
Retic Ct Pct: 1.9 % (ref 0.4–3.1)

## 2019-06-05 LAB — FOLATE: Folate: 6.9 ng/mL (ref >5.9)

## 2019-06-05 LAB — BRAIN NATRIURETIC PEPTIDE: B Natriuretic Peptide: 238 pg/mL — ABNORMAL HIGH (ref 0.0–100.0)

## 2019-06-05 LAB — MRSA PCR SCREENING: MRSA by PCR: NEGATIVE

## 2019-06-05 LAB — VITAMIN B12: Vitamin B-12: 455 pg/mL (ref 180–914)

## 2019-06-05 LAB — FERRITIN: Ferritin: 379 ng/mL — ABNORMAL HIGH (ref 24–336)

## 2019-06-05 LAB — LACTIC ACID, PLASMA: Lactic Acid, Venous: 1.3 mmol/L (ref 0.5–1.9)

## 2019-06-05 MED ORDER — SODIUM CHLORIDE 0.9 % IV SOLN
1.0000 g | INTRAVENOUS | Status: DC
  Administered 2019-06-05 – 2019-06-06 (×2): 1 g via INTRAVENOUS
  Filled 2019-06-05 (×2): qty 10

## 2019-06-05 MED ORDER — SODIUM BICARBONATE 8.4 % IV SOLN
INTRAVENOUS | Status: DC
  Filled 2019-06-05 (×5): qty 1000

## 2019-06-05 MED ORDER — LORAZEPAM 1 MG PO TABS: 1.0000 mg | ORAL_TABLET | ORAL | Status: AC | PRN

## 2019-06-05 MED ORDER — POLYETHYLENE GLYCOL 3350 17 G PO PACK: 17.0000 g | PACK | Freq: Every day | ORAL | Status: DC | PRN

## 2019-06-05 MED ORDER — LACTATED RINGERS IV BOLUS
1000.0000 mL | Freq: Once | INTRAVENOUS | Status: AC
  Administered 2019-06-05: 1000 mL via INTRAVENOUS

## 2019-06-05 MED ORDER — CHLORHEXIDINE GLUCONATE CLOTH 2 % EX PADS
6.0000 | MEDICATED_PAD | Freq: Every day | CUTANEOUS | Status: DC
  Administered 2019-06-05 – 2019-06-19 (×12): 6 via TOPICAL

## 2019-06-05 MED ORDER — SENNA 8.6 MG PO TABS
1.0000 | ORAL_TABLET | Freq: Two times a day (BID) | ORAL | Status: DC
  Filled 2019-06-05 (×3): qty 1

## 2019-06-05 MED ORDER — LORAZEPAM 2 MG/ML IJ SOLN
1.0000 mg | INTRAMUSCULAR | Status: AC | PRN
  Administered 2019-06-05 (×2): 1 mg via INTRAVENOUS
  Administered 2019-06-06 (×5): 2 mg via INTRAVENOUS
  Administered 2019-06-06: 1 mg via INTRAVENOUS
  Administered 2019-06-07 (×5): 2 mg via INTRAVENOUS
  Filled 2019-06-05 (×13): qty 1

## 2019-06-05 MED ORDER — FOLIC ACID 1 MG PO TABS
1.0000 mg | ORAL_TABLET | Freq: Every day | ORAL | Status: DC
  Administered 2019-06-05 – 2019-06-20 (×16): 1 mg via ORAL
  Filled 2019-06-05 (×16): qty 1

## 2019-06-05 MED ORDER — POTASSIUM CHLORIDE CRYS ER 20 MEQ PO TBCR
40.0000 meq | EXTENDED_RELEASE_TABLET | Freq: Three times a day (TID) | ORAL | Status: AC
  Administered 2019-06-05 (×3): 40 meq via ORAL
  Filled 2019-06-05 (×3): qty 2

## 2019-06-05 MED ORDER — TAMSULOSIN HCL 0.4 MG PO CAPS
0.4000 mg | ORAL_CAPSULE | Freq: Every day | ORAL | Status: DC
  Administered 2019-06-05 – 2019-06-09 (×5): 0.4 mg via ORAL
  Filled 2019-06-05 (×5): qty 1

## 2019-06-05 MED ORDER — POTASSIUM CHLORIDE 10 MEQ/100ML IV SOLN
10.0000 meq | INTRAVENOUS | Status: AC
  Administered 2019-06-05 (×4): 10 meq via INTRAVENOUS
  Filled 2019-06-05 (×4): qty 100

## 2019-06-05 MED ORDER — MAGNESIUM SULFATE 2 GM/50ML IV SOLN: 2.0000 g | Freq: Once | INTRAVENOUS | Status: DC

## 2019-06-05 MED ORDER — SODIUM CHLORIDE 0.9 % IV SOLN: 2.0000 g | Freq: Three times a day (TID) | INTRAVENOUS | Status: DC

## 2019-06-05 MED ORDER — CARVEDILOL 12.5 MG PO TABS
12.5000 mg | ORAL_TABLET | Freq: Two times a day (BID) | ORAL | Status: DC
  Administered 2019-06-05 – 2019-06-20 (×31): 12.5 mg via ORAL
  Filled 2019-06-05 (×31): qty 1

## 2019-06-05 MED ORDER — PANTOPRAZOLE SODIUM 40 MG PO TBEC
40.0000 mg | DELAYED_RELEASE_TABLET | Freq: Every day | ORAL | Status: DC
  Administered 2019-06-05 – 2019-06-07 (×3): 40 mg via ORAL
  Filled 2019-06-05 (×3): qty 1

## 2019-06-05 MED ORDER — METRONIDAZOLE IN NACL 5-0.79 MG/ML-% IV SOLN
500.0000 mg | Freq: Once | INTRAVENOUS | Status: AC
  Administered 2019-06-05: 500 mg via INTRAVENOUS
  Filled 2019-06-05: qty 100

## 2019-06-05 MED ORDER — ADULT MULTIVITAMIN W/MINERALS CH
1.0000 | ORAL_TABLET | Freq: Every day | ORAL | Status: DC
  Administered 2019-06-05 – 2019-06-20 (×16): 1 via ORAL
  Filled 2019-06-05 (×16): qty 1

## 2019-06-05 MED ORDER — MAGNESIUM SULFATE 4 GM/100ML IV SOLN
4.0000 g | Freq: Once | INTRAVENOUS | Status: AC
  Administered 2019-06-05: 4 g via INTRAVENOUS
  Filled 2019-06-05: qty 100

## 2019-06-05 NOTE — ED Notes (Signed)
ED TO INPATIENT HANDOFF REPORT  ED Nurse Name and Phone #: [phone]  S Name/Age/Gender Darren Howard 69 y.o. male Room/Bed: APA06/APA06  Code Status   Code Status: Full Code  Home/SNF/Other Home Patient oriented to: self, place, time and situation Is this baseline? Yes   Triage Complete: Triage complete  Chief Complaint Pyelonephritis of left kidney [N12] Pyelonephritis [N12]  Triage Note Pt from home via ems for bi lat leg swelling and trouble with his bowels x1 week.  states he has not been taking h is medications because he is too lazy. Educated that he needs to takes his medications .  "couple" shots daily drinker  Used to be pint/day     Allergies No Known Allergies  Level of Care/Admitting Diagnosis ED Disposition    ED Disposition Condition [place]: [facility] [[id number]]  Level of Care: Stepdown [14]  Covid Evaluation: Asymptomatic Screening Protocol (No Symptoms)  Diagnosis: Pyelonephritis [628315]  Admitting Physician: [name], [place]  Attending Physician: Kathie Dike [[id number]]  Estimated length of stay: past midnight tomorrow  Certification:: I certify this patient will need inpatient services for at least 2 midnights       B Medical/Surgery History Past Medical History:  Diagnosis Date  . Alcohol abuse   . Anemia   . Diabetes mellitus without complication ([place])   . Duodenal erosion   . Erosive gastropathy   . GI bleed 04/2018  . Glucose intolerance (impaired glucose tolerance)   . Hyperlipemia   . Hypertension   . Noncompliance    Past Surgical History:  Procedure Laterality Date  . BIOPSY  05/17/2018   Procedure: BIOPSY;  Surgeon: Daneil Dolin, MD;  [facility];  Service: Endoscopy;;  . CARDIAC CATHETERIZATION    . COLONOSCOPY WITH PROPOFOL N/A 06/01/2018   Procedure: COLONOSCOPY WITH PROPOFOL;  Surgeon: Daneil Dolin, MD;  Location[facility];  Service: Endoscopy;  Laterality:  N/A;  . ESOPHAGOGASTRODUODENOSCOPY (EGD) WITH PROPOFOL N/A 05/17/2018   Dr. Gala Romney: normal esophagus, erosive gastropathy s/p biopsy, duodenal erosions, felt to be NSAID effect  . HERNIA REPAIR    . POLYPECTOMY  06/01/2018   Procedure: POLYPECTOMY;  Surgeon: Daneil Dolin, MD;  Location[facility];  Service: Endoscopy;;  colon     A IV Location/Drains/Wounds Patient Lines/Drains/Airways Status   Active Line/Drains/Airways    Name:   Placement date:   Placement time:   Site:   Days:   Peripheral IV 06/04/19 Left Antecubital   06/04/19    2005    Antecubital   1   Urethral Catheter [facility] Non-latex 16 Fr.   06/05/19    0036    Non-latex   less than 1          Intake/Output Last 24 hours  Intake/Output Summary (Last 24 hours) at 06/05/2019 1718 Last data filed at 06/05/2019 1049 Gross per 24 hour  Intake 300 ml  Output 1100 ml  Net -800 ml    Labs/Imaging Results for orders placed or performed during the hospital encounter of 06/04/19 (from the past 48 hour(s))  Urine rapid drug screen (hosp performed)     Status: None   Collection Time: 06/04/19 12:31 AM  Result Value Ref Range   Opiates NONE DETECTED NONE DETECTED   Cocaine NONE DETECTED NONE DETECTED   Benzodiazepines NONE DETECTED NONE DETECTED   Amphetamines NONE DETECTED NONE DETECTED   Tetrahydrocannabinol NONE DETECTED NONE DETECTED   Barbiturates NONE  DETECTED NONE DETECTED    Comment: (NOTE) DRUG SCREEN FOR MEDICAL PURPOSES ONLY.  IF CONFIRMATION IS NEEDED FOR ANY PURPOSE, NOTIFY LAB WITHIN 5 DAYS. LOWEST DETECTABLE LIMITS FOR URINE DRUG SCREEN Drug Class                     Cutoff (ng/mL) Amphetamine and metabolites    1000 Barbiturate and metabolites    200 Benzodiazepine                 585 Tricyclics and metabolites     300 Opiates and metabolites        300 Cocaine and metabolites        300 THC                            50 Performed at [facility], [address] [place], [place] [address]   CBC  with Differential     Status: Abnormal   Collection Time: 06/04/19  8:05 PM  Result Value Ref Range   WBC 9.7 4.0 - 10.5 K/uL   RBC 2.46 (L) 4.22 - 5.81 MIL/uL   Hemoglobin 7.6 (L) 13.0 - 17.0 g/dL   HCT 23.1 (L) 39.0 - 52.0 %   MCV 93.9 80.0 - 100.0 fL   MCH 30.9 26.0 - 34.0 pg   MCHC 32.9 30.0 - 36.0 g/dL   RDW 18.6 (H) 11.5 - 15.5 %   Platelets 387 150 - 400 K/uL   nRBC 0.0 0.0 - 0.2 %   Neutrophils Relative % 81 %   Neutro Abs 7.9 (H) 1.7 - 7.7 K/uL   Lymphocytes Relative 13 %   Lymphs Abs 1.2 0.7 - 4.0 K/uL   Monocytes Relative 6 %   Monocytes Absolute 0.5 0.1 - 1.0 K/uL   Eosinophils Relative 0 %   Eosinophils Absolute 0.0 0.0 - 0.5 K/uL   Basophils Relative 0 %   Basophils Absolute 0.0 0.0 - 0.1 K/uL   Immature Granulocytes 0 %   Abs Immature Granulocytes 0.03 0.00 - 0.07 K/uL    Comment: Performed at [facility], [address], [place], [place] [address]  Comprehensive metabolic panel     Status: Abnormal   Collection Time: 06/04/19  8:05 PM  Result Value Ref Range   Sodium 139 135 - 145 mmol/L   Potassium 2.4 (LL) 3.5 - 5.1 mmol/L    Comment: CRITICAL RESULT CALLED TO, READ BACK BY AND VERIFIED WITH: TUTTLE,A @ 2057 ON 06/04/19 BY JUW    Chloride 114 (H) 98 - 111 mmol/L   CO2 15 (L) 22 - 32 mmol/L   Glucose, Bld 124 (H) 70 - 99 mg/dL    Comment: Glucose reference range applies only to samples taken after fasting for at least 8 hours.   BUN 29 (H) 8 - 23 mg/dL   Creatinine, Ser 1.38 (H) 0.61 - 1.24 mg/dL   Calcium 6.4 (LL) 8.9 - 10.3 mg/dL    Comment: CRITICAL RESULT CALLED TO, READ BACK BY AND VERIFIED WITH: TUTTLE,A @ 2057 ON 06/04/19 BY JUW    Total Protein 6.0 (L) 6.5 - 8.1 g/dL   Albumin 2.6 (L) 3.5 - 5.0 g/dL   AST 43 (H) 15 - 41 U/L   ALT 32 0 - 44 U/L   Alkaline Phosphatase 115 38 - 126 U/L   Total Bilirubin 1.0 0.3 - 1.2 mg/dL   GFR calc non Af Amer 53 (L) >60  mL/min   GFR calc Af Amer >60 >60 mL/min   Anion gap 10 5 - 15    Comment: Performed at  [facility], [address] [place], [place] [address]  Lipase, blood     Status: None   Collection Time: 06/04/19  8:05 PM  Result Value Ref Range   Lipase 12 11 - 51 U/L    Comment: Performed at [facility], [address], [place], [place] [address]  Brain natriuretic peptide     Status: Abnormal   Collection Time: 06/04/19  8:05 PM  Result Value Ref Range   B Natriuretic Peptide 292.0 (H) 0.0 - 100.0 pg/mL    Comment: Performed at [facility], [address], [place], [place] [address]  Lactic acid, plasma     Status: Abnormal   Collection Time: 06/04/19  8:05 PM  Result Value Ref Range   Lactic Acid, Venous 2.7 (HH) 0.5 - 1.9 mmol/L    Comment: CRITICAL RESULT CALLED TO, READ BACK BY AND VERIFIED WITH: TUTTLE,A @ 2046 ON 06/04/19 BY JUW Performed at [facility], [address] [place], [place] [address]   Culture, blood (routine x 2)     Status: None (Preliminary result)   Collection Time: 06/04/19  8:05 PM   Specimen: Left Antecubital; Blood  Result Value Ref Range   Specimen Description LEFT ANTECUBITAL    Special Requests      BOTTLES DRAWN AEROBIC AND ANAEROBIC Blood Culture adequate volume   Culture      NO GROWTH < 24 HOURS Performed at [facility], [address], [place], [place] [address]    Report Status PENDING   Protime-INR     Status: Abnormal   Collection Time: 06/04/19  8:05 PM  Result Value Ref Range   Prothrombin Time 15.3 (H) 11.4 - 15.2 seconds   INR 1.2 0.8 - 1.2    Comment: (NOTE) INR goal varies based on device and disease states. Performed at [facility], [address] [place], [place] [address]   Ethanol     Status: None   Collection Time: 06/04/19  8:08 PM  Result Value Ref Range   Alcohol, Ethyl (B) <10 <10 mg/dL    Comment: (NOTE) Lowest detectable limit for serum alcohol is 10 mg/dL. For medical purposes only. Performed at [facility], [address], [place], [place] [address]   POC occult blood, ED     Status: None   Collection  Time: 06/04/19  8:11 PM  Result Value Ref Range   Fecal Occult Bld NEGATIVE NEGATIVE  Culture, blood (routine x 2)     Status: None (Preliminary result)   Collection Time: 06/04/19  8:49 PM   Specimen: Right Antecubital; Blood  Result Value Ref Range   Specimen Description RIGHT ANTECUBITAL    Special Requests      BOTTLES DRAWN AEROBIC AND ANAEROBIC Blood Culture adequate volume   Culture      NO GROWTH < 24 HOURS Performed at [facility] [place] [place], [place] [address]    Report Status PENDING   Urinalysis, Routine w reflex microscopic     Status: None   Collection Time: 06/05/19 12:31 AM  Result Value Ref Range   Color, Urine YELLOW YELLOW   APPearance CLEAR CLEAR   Specific Gravity, Urine 1.017 1.005 - 1.030   pH 5.0 5.0 - 8.0   Glucose, UA NEGATIVE NEGATIVE mg/dL   Hgb urine dipstick NEGATIVE NEGATIVE   Bilirubin Urine NEGATIVE NEGATIVE  Ketones, ur NEGATIVE NEGATIVE mg/dL   Protein, ur NEGATIVE NEGATIVE mg/dL   Nitrite NEGATIVE NEGATIVE   Leukocytes,Ua NEGATIVE NEGATIVE    Comment: Performed at [facility], [address] [place], [place] [address]  Vitamin B12     Status: None   Collection Time: 06/05/19  4:20 AM  Result Value Ref Range   Vitamin B-12 455 180 - 914 pg/mL    Comment: (NOTE) This assay is not validated for testing neonatal or myeloproliferative syndrome specimens for Vitamin B12 levels. Performed at [facility], [address] [place], [place] [address]   Iron and TIBC     Status: Abnormal   Collection Time: 06/05/19  4:20 AM  Result Value Ref Range   Iron 17 (L) 45 - 182 ug/dL   TIBC 127 (L) 250 - 450 ug/dL   Saturation Ratios 13 (L) 17.9 - 39.5 %   UIBC 110 ug/dL    Comment: Performed at [facility], [address], [place], [place] [address]  Ferritin     Status: Abnormal   Collection Time: 06/05/19  4:20 AM  Result Value Ref Range   Ferritin 379 (H) 24 - 336 ng/mL    Comment: Performed at [facility], [address]  [place], [place] [address]  Reticulocytes     Status: Abnormal   Collection Time: 06/05/19  4:20 AM  Result Value Ref Range   Retic Ct Pct 1.9 0.4 - 3.1 %   RBC. 2.43 (L) 4.22 - 5.81 MIL/uL   Retic Count, Absolute 46.2 19.0 - 186.0 K/uL   Immature Retic Fract 28.0 (H) 2.3 - 15.9 %    Comment: Performed at [facility], [address] [place], [place] [address]  Folate     Status: None   Collection Time: 06/05/19  4:21 AM  Result Value Ref Range   Folate 6.9 >5.9 ng/mL    Comment: Performed at [facility], [address] [place], [place] [address]  HIV Antibody (routine testing w rflx)     Status: None   Collection Time: 06/05/19  4:21 AM  Result Value Ref Range   HIV Screen 4th Generation wRfx NON REACTIVE NON REACTIVE    Comment: Performed at [facility] [address], [place],  [address]  Comprehensive metabolic panel     Status: Abnormal   Collection Time: 06/05/19  4:21 AM  Result Value Ref Range   Sodium 141 135 - 145 mmol/L   Potassium 2.7 (LL) 3.5 - 5.1 mmol/L    Comment: CRITICAL RESULT CALLED TO, READ BACK BY AND VERIFIED WITH: NICKOLS,K @ 0517 ON 06/05/19 BY JUW    Chloride 116 (H) 98 - 111 mmol/L   CO2 15 (L) 22 - 32 mmol/L   Glucose, Bld 123 (H) 70 - 99 mg/dL    Comment: Glucose reference range applies only to samples taken after fasting for at least 8 hours.   BUN 26 (H) 8 - 23 mg/dL   Creatinine, Ser 1.28 (H) 0.61 - 1.24 mg/dL   Calcium 6.5 (L) 8.9 - 10.3 mg/dL   Total Protein 6.1 (L) 6.5 - 8.1 g/dL   Albumin 2.6 (L) 3.5 - 5.0 g/dL   AST 47 (H) 15 - 41 U/L   ALT 32 0 - 44 U/L   Alkaline Phosphatase 114 38 - 126 U/L   Total Bilirubin 1.0 0.3 - 1.2 mg/dL   GFR calc non Af Amer 58 (L) >60 mL/min   GFR calc Af Amer >60 >60  mL/min   Anion gap 10 5 - 15    Comment: Performed at [facility], [address] [place], [place] [address]  Magnesium     Status: Abnormal   Collection Time: 06/05/19  4:21 AM  Result Value Ref Range   Magnesium 1.3 (L) 1.7 -  2.4 mg/dL    Comment: Performed at [facility], [address], [place], [place] [address]  Phosphorus     Status: None   Collection Time: 06/05/19  4:21 AM  Result Value Ref Range   Phosphorus 3.8 2.5 - 4.6 mg/dL    Comment: Performed at [facility], [address] [place], [place] [address]  CBC     Status: Abnormal   Collection Time: 06/05/19  4:21 AM  Result Value Ref Range   WBC 11.0 (H) 4.0 - 10.5 K/uL   RBC 2.43 (L) 4.22 - 5.81 MIL/uL   Hemoglobin 7.5 (L) 13.0 - 17.0 g/dL   HCT 23.2 (L) 39.0 - 52.0 %   MCV 95.5 80.0 - 100.0 fL   MCH 30.9 26.0 - 34.0 pg   MCHC 32.3 30.0 - 36.0 g/dL   RDW 18.9 (H) 11.5 - 15.5 %   Platelets 362 150 - 400 K/uL   nRBC 0.0 0.0 - 0.2 %    Comment: Performed at [facility] [address] [place], [place] [address]  Brain natriuretic peptide     Status: Abnormal   Collection Time: 06/05/19  4:21 AM  Result Value Ref Range   B Natriuretic Peptide 238.0 (H) 0.0 - 100.0 pg/mL    Comment: Performed at [facility], [address] [place], [place] [address]  Lactic acid, plasma     Status: None   Collection Time: 06/05/19  6:32 AM  Result Value Ref Range   Lactic Acid, Venous 1.3 0.5 - 1.9 mmol/L    Comment: Performed at [facility], [address], [place], [place] [address]  Basic metabolic panel     Status: Abnormal   Collection Time: 06/05/19  3:40 PM  Result Value Ref Range   Sodium 143 135 - 145 mmol/L   Potassium 2.8 (L) 3.5 - 5.1 mmol/L   Chloride 120 (H) 98 - 111 mmol/L   CO2 16 (L) 22 - 32 mmol/L   Glucose, Bld 127 (H) 70 - 99 mg/dL    Comment: Glucose reference range applies only to samples taken after fasting for at least 8 hours.   BUN 24 (H) 8 - 23 mg/dL   Creatinine, Ser 1.16 0.61 - 1.24 mg/dL   Calcium 6.5 (L) 8.9 - 10.3 mg/dL   GFR calc non Af Amer >60 >60 mL/min   GFR calc Af Amer >60 >60 mL/min   Anion gap 7 5 - 15    Comment: Performed at [facility], [address], [place], [place] [address]   CT Abdomen Pelvis W  Contrast  Result Date: 06/04/2019 CLINICAL DATA:  69 year old male with abdominal distension and diarrhea. Concern for acute diverticulitis. EXAM: CT ABDOMEN AND PELVIS WITH CONTRAST TECHNIQUE: Multidetector CT imaging of the abdomen and pelvis was performed using the standard protocol following bolus administration of intravenous contrast. CONTRAST:  143mL OMNIPAQUE IOHEXOL 300 MG/ML  SOLN COMPARISON:  None. FINDINGS: Lower chest: The visualized lung bases are clear. No intra-abdominal free air or free fluid. Hepatobiliary: Severe fatty infiltration of the liver. No intrahepatic biliary ductal dilatation. The gallbladder is unremarkable. Pancreas: Atrophic pancreas. No Accu to inflammatory changes. No dilatation of the main pancreatic  duct. Spleen: Normal in size without focal abnormality. Adrenals/Urinary Tract: The adrenal glands are unremarkable. There is mild bilateral hydronephrosis, right greater left. There is enhancement of the right renal urothelium concerning for pyelonephritis. Correlation with urinalysis recommended. There is slight delayed enhancement of the right renal parenchyma. Multiple right renal cysts measure up to 4 cm in the interpolar aspect of the right kidney. Additional subcentimeter right renal hypodense lesions are too small to characterize. The urinary bladder is distended. Stomach/Bowel: There is narrowing of a segment of the rectosigmoid, likely related to mass effect caused by distended urinary bladder. There is mild thickened appearance of the segment of colon with surrounding stranding. The perisigmoid stranding may be related to UTI but concerning for stercoral colitis. Clinical correlation is recommended. There is probable mild associate luminal narrowing of the sigmoid colon. No evidence of small-bowel obstruction. The appendix is not visualized with certainty. No inflammatory changes identified in the right lower quadrant. Vascular/Lymphatic: The abdominal aorta and IVC  unremarkable. No portal venous gas. There is no adenopathy. Reproductive: The prostate and seminal vesicles are grossly unremarkable. Partially visualized fluid within the left inguinal canal likely related to hydrocele. Other: Mild diffuse subcutaneous edema. Musculoskeletal: Degenerative changes of the spine. No acute osseous pathology. IMPRESSION: 1. Distended urinary bladder with mild bilateral hydronephrosis and findings of right-sided pyelonephritis. Correlation with urinalysis recommended. 2. Thickened appearance of the rectosigmoid with surrounding stranding which may be related to UTI but concerning for stercoral colitis. Clinical correlation is recommended. 3. Severe fatty liver. Electronically Signed   By: Anner Crete M.D.   On: 06/04/2019 23:41   US Venous Img Lower Bilateral (DVT)  Result Date: 06/05/2019 CLINICAL DATA:  Bilateral lower extremity pain and edema for 1 month. EXAM: BILATERAL LOWER EXTREMITY VENOUS DOPPLER ULTRASOUND TECHNIQUE: Gray-scale sonography with compression, as well as color and duplex ultrasound, were performed to evaluate the deep venous system(s) from the level of the common femoral vein through the popliteal and proximal calf veins. COMPARISON:  None. FINDINGS: VENOUS Normal compressibility of the common femoral, superficial femoral, and popliteal veins, as well as the visualized calf veins. Visualized portions of profunda femoral vein and great saphenous vein unremarkable. No filling defects to suggest DVT on grayscale or color Doppler imaging. Doppler waveforms show normal direction of venous flow, normal respiratory phasicity and response to augmentation. OTHER None. Limitations: none IMPRESSION: No femoropopliteal DVT nor evidence of DVT within the visualized calf veins. If clinical symptoms are inconsistent or if there are persistent or worsening symptoms, further imaging (possibly involving the iliac veins) may be warranted. Electronically Signed   By: Abigail Miyamoto M.D.   On: 06/05/2019 11:44   DG Chest Port 1 View  Result Date: 06/04/2019 CLINICAL DATA:  Tachycardia, bilateral lower extremity edema EXAM: PORTABLE CHEST 1 VIEW COMPARISON:  05/13/2018 FINDINGS: The heart size and mediastinal contours are within normal limits. Both lungs are clear. The visualized skeletal structures are unremarkable. IMPRESSION: No active disease. Electronically Signed   By: Randa Ngo M.D.   On: 06/04/2019 20:32    Pending Labs Unresulted Labs (From admission, onward)    Start     Ordered   06/06/19 0500  Comprehensive metabolic panel  Tomorrow morning,   R     06/05/19 1027   06/06/19 0500  CBC  Tomorrow morning,   R     06/05/19 1027   06/05/19 1021  C Difficile Quick Screen w PCR reflex  (C Difficile quick screen w PCR reflex  panel)  Once, for 24 hours,   STAT     06/05/19 1026   06/04/19 2145  SARS CORONAVIRUS 2 (TAT 6-24 HRS) Nasopharyngeal Nasopharyngeal Swab  (Tier 3 (TAT 6-24 hrs))  Once,   STAT    Question Answer Comment  Is this test for diagnosis or screening Screening   Symptomatic for COVID-19 as defined by CDC No   Hospitalized for COVID-19 No   Admitted to ICU for COVID-19 No   Previously tested for COVID-19 No   Resident in a congregate (group) care setting No   Employed in healthcare setting No      06/04/19 2144   06/04/19 2100  Ammonia  ONCE - STAT,   STAT     06/04/19 2059   06/04/19 2012  Urine culture  ONCE - STAT,   STAT     06/04/19 2012   06/04/19 2005  Lactic acid, plasma  Now then every 2 hours,   STAT     06/04/19 2004          Vitals/Pain Today's Vitals   06/05/19 1100 06/05/19 1154 06/05/19 1200 06/05/19 1230  BP: (!) 133/94 (!) 155/96 (!) 147/80 132/73  Pulse: (!) 101 (!) 111 96 91  Resp:  19 16 15   Temp:  99.6 F (37.6 C)    TempSrc:  Oral    SpO2: 100% 99% 100% 100%  Weight:      Height:      PainSc:        Isolation Precautions Enteric precautions (UV disinfection)  Medications Medications   thiamine (B-1) injection 100 mg (100 mg Intravenous Given 06/05/19 1116)  thiamine tablet 100 mg (100 mg Oral Given 06/05/19 1115)    Or  thiamine (B-1) injection 100 mg ( Intravenous See Alternative 06/05/19 1115)  LORazepam (ATIVAN) tablet 1-4 mg ( Oral See Alternative 06/05/19 0838)    Or  LORazepam (ATIVAN) injection 1-4 mg (1 mg Intravenous Given 7/67/34 1937)  folic acid (FOLVITE) tablet 1 mg (1 mg Oral Given 06/05/19 1115)  multivitamin with minerals tablet 1 tablet (1 tablet Oral Given 06/05/19 1115)  senna (SENOKOT) tablet 8.6 mg (8.6 mg Oral Refused 06/05/19 1202)  polyethylene glycol (MIRALAX / GLYCOLAX) packet 17 g (has no administration in time range)  pantoprazole (PROTONIX) EC tablet 40 mg (40 mg Oral Given 06/05/19 1115)  carvedilol (COREG) tablet 12.5 mg (12.5 mg Oral Given 06/05/19 1115)  cefTRIAXone (ROCEPHIN) 1 g in sodium chloride 0.9 % 100 mL IVPB (0 g Intravenous Stopped 06/05/19 1049)  dextrose 5 % 1,000 mL with potassium chloride 40 mEq, sodium bicarbonate 150 mEq infusion ( Intravenous New Bag/Given 06/05/19 1158)  potassium chloride SA (KLOR-CON) CR tablet 40 mEq (40 mEq Oral Given 06/04/19 2135)  potassium chloride 10 mEq in 100 mL IVPB (0 mEq Intravenous Stopped 06/05/19 0216)  ceFEPIme (MAXIPIME) 2 g in sodium chloride 0.9 % 100 mL IVPB (0 g Intravenous Stopped 06/04/19 2226)  metroNIDAZOLE (FLAGYL) IVPB 500 mg (0 mg Intravenous Stopped 06/05/19 0028)  vancomycin (VANCOREADY) IVPB 2000 mg/400 mL (0 mg Intravenous Stopped 06/05/19 0216)  calcium gluconate 1 g/ 50 mL sodium chloride IVPB (0 mg Intravenous Stopped 06/04/19 2252)  iohexol (OMNIPAQUE) 300 MG/ML solution 100 mL (100 mLs Intravenous Contrast Given 06/04/19 2312)  lactated ringers bolus 1,000 mL (0 mLs Intravenous Stopped 06/05/19 0216)  potassium chloride SA (KLOR-CON) CR tablet 40 mEq (40 mEq Oral Given 06/05/19 1616)  magnesium sulfate IVPB 4 g 100 mL (0 g Intravenous Stopped 06/05/19  9122)  metroNIDAZOLE (FLAGYL) IVPB  500 mg (0 mg Intravenous Stopped 06/05/19 0954)    Mobility walks Moderate fall risk   Focused Assessments    R Recommendations: See Admitting Provider Note  Report given to:   Additional Notes:

## 2019-06-05 NOTE — Progress Notes (Signed)
Patient admitted to the hospital earlier this morning by Dr. Olevia Bowens  Patient seen and examined.  He is awake, appears mildly confused.  He is noted to be tachycardic presents complaints of some pain in his left lower abdomen.  Reports that he is still having some loose stools.  Patient has 2+ edema in his lower extremities.  Patient initially presented to the hospital with leg swelling.  He also complained of diarrhea for the past week with left lower quadrant abdominal pain.  He was noted to be hypokalemic, with metabolic acidosis and elevated creatinine.  Patient also has a history of alcohol abuse and was noted to be tachycardic with concerns of alcohol withdrawal.  Assessment/plan:  1. Possible right-sided pyelonephritis.  Started on ceftriaxone.  Urine culture has been sent. 2. Possible stercoral colitis.  Patient has abdominal pain.  Continue on Rocephin. 3. Diarrhea.  Reports having persistent diarrhea for the past week.  Will check stool for C. Difficile 4. Alcohol abuse.  He is tremulous, confused and appears tachycardic.  Continue lorazepam per CIWA protocol 5. Hypokalemia.  Suspect this is related to poor p.o. intake/GI losses.  This is being replaced.  Magnesium is also been replaced. 6. Non anion gap metabolic acidosis.  Likely related to diarrhea.  Will start on bicarbonate infusion. 7. Lower extremity edema.  Venous Dopplers negative for DVT.  BNP of 238.  Order echocardiogram.  Can consider starting IV Lasix once diarrhea has resolved.  Possibly related to hypoalbuminemia. 8. Acute kidney injury.  Related to fluid losses from GI tract.  Improving with IV hydration. 9. Diabetes.  Since p.o. intake is likely reduced, we low give reduced dose Lantus.  Continue on sliding scale insulin. 10. Chronic normocytic anemia.  Likely related to poor nutrition.  Hemoglobin appears near baseline.  No signs of bleeding.  Hemoccult is negative.  Continue to monitor. 11. Acute urinary retention.   Imaging indicated distended bladder with bilateral hydronephrosis.  Foley catheter placed with expulsion of 800 cc of urine.  Continue Foley catheter for now.  Start on Flomax.  Severity of Illness: The appropriate patient status for this patient is INPATIENT. Inpatient status is judged to be reasonable and necessary in order to provide the required intensity of service to ensure the patient's safety. The patient's presenting symptoms, physical exam findings, and initial radiographic and laboratory data in the context of their chronic comorbidities is felt to place them at high risk for further clinical deterioration. Furthermore, it is not anticipated that the patient will be medically stable for discharge from the hospital within 2 midnights of admission. The following factors support the patient status of inpatient.    "           The patient's presenting symptoms include  persistent diarrhea, generalized weakness, tremors and confusion "           The worrisome physical exam findings include  tachycardia, confusion lower extremity edema "           The initial radiographic and laboratory data are worrisome because of  severe hypokalemia, metabolic acidosis due to evidence of known pyelonephritis/hydronephrosis as well as stercoral colitis.  Elevated creatinine consistent with acute kidney injury "           The chronic co-morbidities include  alcohol abuse, diabetes     * I certify that at the point of admission it is my clinical judgment that the patient will require inpatient hospital care spanning beyond 2 midnights from the  point of admission due to high intensity of service, high risk for further deterioration and high frequency of surveillance required.Jolaine Artist Xavier Fournier

## 2019-06-05 NOTE — ED Provider Notes (Signed)
[facility] EMERGENCY DEPARTMENT Provider Note   CSN: [id number] Arrival date & time: 06/04/19  1913     History Chief Complaint  Patient presents with  . Leg Swelling    Darren Howard is a 69 y.o. male with a past medical history of alcohol abuse, DM, hypertension, hyperlipidemia, noncompliance, tachycardia, who presents today for evaluation of multiple complaints. He reports that over the past week he has had diarrhea.  He states that he has been taking Pepto for this and for abdominal pain without significant relief.  He denies any bright red blood however does note that it has been dark, tarry, and sticky.  He is unsure if this started before or after he started taking Pepto-Bismol.  He reports that over the past 3 days he has had dysuria, increased frequency, and urgency.  He states that when he tries to urinate it is difficult to do so.       He denies any pain in the chest.  No cough or shortness of breath.  He does report leg swelling.  His leg swelling is chronic and unchanged from baseline.  He states that his legs swell more when he does not take his medicine.  He has the medicines available to him, says that he just does not take them.  HPI     Past Medical History:  Diagnosis Date  . Alcohol abuse   . Anemia   . Diabetes mellitus without complication ([place])   . Duodenal erosion   . Erosive gastropathy   . GI bleed 04/2018  . Glucose intolerance (impaired glucose tolerance)   . Hyperlipemia   . Hypertension   . Noncompliance     Patient Active Problem List   Diagnosis Date Noted  . Pyelonephritis of left kidney 06/05/2019  . Hematochezia 05/30/2018  . Hypokalemia 05/27/2018  . Hypomagnesemia 05/27/2018  . Hypocalcemia 05/27/2018  . Dizziness 05/13/2018  . Epistaxis 02/13/2014  . Hypertension 02/13/2014  . Anemia 02/13/2014  . DM2 (diabetes mellitus, type 2) ([place]) 02/13/2014  . Tachycardia 02/13/2014  . Hypertensive urgency     Past Surgical  History:  Procedure Laterality Date  . BIOPSY  05/17/2018   Procedure: BIOPSY;  Surgeon: Daneil Dolin, MD;  Location: [facility];  Service: Endoscopy;;  . CARDIAC CATHETERIZATION    . COLONOSCOPY WITH PROPOFOL N/A 06/01/2018   Procedure: COLONOSCOPY WITH PROPOFOL;  Surgeon: Daneil Dolin, MD;  Location: [facility];  Service: Endoscopy;  Laterality: N/A;  . ESOPHAGOGASTRODUODENOSCOPY (EGD) WITH PROPOFOL N/A 05/17/2018   Dr. Gala Romney: normal esophagus, erosive gastropathy s/p biopsy, duodenal erosions, felt to be NSAID effect  . HERNIA REPAIR    . POLYPECTOMY  06/01/2018   Procedure: POLYPECTOMY;  Surgeon: Daneil Dolin, MD;  Location: [facility];  Service: Endoscopy;;  colon       Family History  Problem Relation Age of Onset  . Congestive Heart Failure Mother   . Congestive Heart Failure Father   . Cancer Brother        unknown kind   . Colon cancer Neg Hx   . Colon polyps Neg Hx     Social History   Tobacco Use  . Smoking status: Current Every Day Smoker    Packs/day: 0.50    Years: 30.00    Pack years: 15.00    Types: Cigarettes  . Smokeless tobacco: Never Used  Substance Use Topics  . Alcohol use: Yes    Alcohol/week: 2.0 standard drinks  Types: 2 Shots of liquor per week  . Drug use: No    Home Medications Prior to Admission medications   Medication Sig Start Date End Date Taking? Authorizing Provider  amLODipine (NORVASC) 2.5 MG tablet Take 4 tablets (10 mg total) by mouth daily. 05/30/18   Orson Eva, MD  atorvastatin (LIPITOR) 10 MG tablet Take 10 mg by mouth at bedtime.    [provider]  calcium carbonate (TUMS - DOSED IN MG ELEMENTAL CALCIUM) 500 MG chewable tablet Chew 2 tablets (400 mg of elemental calcium total) by mouth 2 (two) times daily with a meal. 05/30/18   Tat, Shanon Brow, MD  carvedilol (COREG) 25 MG tablet Take 25 mg by mouth 2 (two) times daily.    [provider]  ferrous sulfate 325 (65 FE) MG tablet Take 1 tablet (325  mg total) by mouth daily with breakfast. 05/31/18   Tat, Shanon Brow, MD  hydrocortisone (ANUSOL-HC) 2.5 % rectal cream Place rectally 3 (three) times daily. Apply to anorectum every 6 hours x 10 days 06/01/18   Tat, Shanon Brow, MD  magnesium oxide (MAG-OX) 400 (241.3 Mg) MG tablet Take 1 tablet (400 mg total) by mouth 2 (two) times daily. 06/01/18   Orson Eva, MD  metFORMIN (GLUCOPHAGE-XR) 500 MG 24 hr tablet Take 500 mg by mouth 2 (two) times daily.     [provider]  pantoprazole (PROTONIX) 40 MG tablet Take 1 tablet (40 mg total) by mouth daily for 30 days. 05/19/18 06/18/18  Manuella Ghazi, Pratik D, DO  polyethylene glycol (MIRALAX / GLYCOLAX) 17 g packet Take 17 g by mouth daily.    [provider]  polyethylene glycol (MIRALAX / GLYCOLAX) 17 g packet Take 17 g by mouth daily. 06/01/18   Orson Eva, MD  senna (SENOKOT) 8.6 MG TABS tablet Take 1 tablet by mouth 2 (two) times daily.    [provider]  senna (SENOKOT) 8.6 MG TABS tablet Take 2 tablets (17.2 mg total) by mouth daily. 06/01/18   Orson Eva, MD  Vitamin D, Ergocalciferol, (DRISDOL) 1.25 MG (50000 UT) CAPS capsule Take 1 capsule (50,000 Units total) by mouth every 7 (seven) days. Next dose 06/05/18 06/05/18   Orson Eva, MD    Allergies    Patient has no known allergies.  Review of Systems   Review of Systems  Constitutional: Positive for chills and fatigue. Negative for fever.  HENT: Negative for congestion.   Respiratory: Negative for chest tightness and shortness of breath.   Cardiovascular: Positive for leg swelling (Normally right is more swollen than left). Negative for chest pain.  Gastrointestinal: Positive for abdominal pain, diarrhea and nausea. Negative for abdominal distention and vomiting.  Genitourinary: Positive for dysuria, frequency and urgency.  Skin: Negative for color change, rash and wound.  Neurological: Negative for weakness and headaches.  Psychiatric/Behavioral: Negative for confusion.  All other  systems reviewed and are negative.   Physical Exam Updated Vital Signs BP (!) 133/97   Pulse 96   Temp 98.8 F (37.1 C)   Resp 17   Ht 6\' 1"  (1.854 m)   Wt 93 kg   SpO2 100%   BMI 27.05 kg/m   Physical Exam Vitals and nursing note reviewed.  Constitutional:      General: He is not in acute distress.    Appearance: He is well-developed. He is not diaphoretic.  HENT:     Head: Normocephalic and atraumatic.     Mouth/Throat:     Mouth: Mucous membranes are  moist.  Eyes:     General: No scleral icterus.       Right eye: No discharge.        Left eye: No discharge.     Conjunctiva/sclera: Conjunctivae normal.  Cardiovascular:     Rate and Rhythm: Regular rhythm. Tachycardia present.     Heart sounds: Normal heart sounds.     Comments: 2+ radial pulses bilaterally, 1+ DP pulse bilaterally.  Capillary refill to bilateral feet is under 2 seconds. Pulmonary:     Effort: Pulmonary effort is normal. No respiratory distress.     Breath sounds: No stridor.  Abdominal:     General: Bowel sounds are increased. There is no distension.     Tenderness: There is abdominal tenderness (Diffuse tenderness, primarily in the right lower quadrant.). There is no guarding or rebound.  Musculoskeletal:        General: No deformity.     Cervical back: Normal range of motion.     Right lower leg: Edema present.     Left lower leg: Edema present.     Comments: 2+ pitting edema right lower extremity, 1+ pitting edema left lower extremity.  Skin:    General: Skin is warm and dry.  Neurological:     Mental Status: He is alert.     Motor: No abnormal muscle tone.     Comments: Patient has mild tremor.  He is awake and alert, answers questions appropriately.  No slurred speech or facial droop.  Psychiatric:        Behavior: Behavior normal.     ED Results / Procedures / Treatments   Labs (all labs ordered are listed, but only abnormal results are displayed) Labs Reviewed  CBC WITH  DIFFERENTIAL/PLATELET - Abnormal; Notable for the following components:      Result Value   RBC 2.46 (*)    Hemoglobin 7.6 (*)    HCT 23.1 (*)    RDW 18.6 (*)    Neutro Abs 7.9 (*)    All other components within normal limits  COMPREHENSIVE METABOLIC PANEL - Abnormal; Notable for the following components:   Potassium 2.4 (*)    Chloride 114 (*)    CO2 15 (*)    Glucose, Bld 124 (*)    BUN 29 (*)    Creatinine, Ser 1.38 (*)    Calcium 6.4 (*)    Total Protein 6.0 (*)    Albumin 2.6 (*)    AST 43 (*)    GFR calc non Af Amer 53 (*)    All other components within normal limits  BRAIN NATRIURETIC PEPTIDE - Abnormal; Notable for the following components:   B Natriuretic Peptide 292.0 (*)    All other components within normal limits  LACTIC ACID, PLASMA - Abnormal; Notable for the following components:   Lactic Acid, Venous 2.7 (*)    All other components within normal limits  PROTIME-INR - Abnormal; Notable for the following components:   Prothrombin Time 15.3 (*)    All other components within normal limits  CULTURE, BLOOD (ROUTINE X 2)  CULTURE, BLOOD (ROUTINE X 2)  URINE CULTURE  SARS CORONAVIRUS 2 (TAT 6-24 HRS)  ETHANOL  LIPASE, BLOOD  RAPID URINE DRUG SCREEN, HOSP PERFORMED  LACTIC ACID, PLASMA  URINALYSIS, ROUTINE W REFLEX MICROSCOPIC  AMMONIA  VITAMIN B12  FOLATE  IRON AND TIBC  FERRITIN  RETICULOCYTES  MAGNESIUM  POC OCCULT BLOOD, ED    EKG EKG Interpretation  Date/Time:  Saturday June 04 2019 19:42:21 EDT Ventricular Rate:  140 PR Interval:    QRS Duration: 88 QT Interval:  288 QTC Calculation: 402 R Axis:   -46 Text Interpretation: Atrial fibrillation Paired ventricular premature complexes Inferior infarct, old Consider anterior infarct Artifact in lead(s) I aVR aVL V1 V2 Interpretation limited secondary to artifact Confirmed by Fredia Sorrow [phone]) on 06/04/2019 8:06:53 PM   Radiology CT Abdomen Pelvis W Contrast  Result Date:  06/04/2019 CLINICAL DATA:  69 year old male with abdominal distension and diarrhea. Concern for acute diverticulitis. EXAM: CT ABDOMEN AND PELVIS WITH CONTRAST TECHNIQUE: Multidetector CT imaging of the abdomen and pelvis was performed using the standard protocol following bolus administration of intravenous contrast. CONTRAST:  163mL OMNIPAQUE IOHEXOL 300 MG/ML  SOLN COMPARISON:  None. FINDINGS: Lower chest: The visualized lung bases are clear. No intra-abdominal free air or free fluid. Hepatobiliary: Severe fatty infiltration of the liver. No intrahepatic biliary ductal dilatation. The gallbladder is unremarkable. Pancreas: Atrophic pancreas. No Accu to inflammatory changes. No dilatation of the main pancreatic duct. Spleen: Normal in size without focal abnormality. Adrenals/Urinary Tract: The adrenal glands are unremarkable. There is mild bilateral hydronephrosis, right greater left. There is enhancement of the right renal urothelium concerning for pyelonephritis. Correlation with urinalysis recommended. There is slight delayed enhancement of the right renal parenchyma. Multiple right renal cysts measure up to 4 cm in the interpolar aspect of the right kidney. Additional subcentimeter right renal hypodense lesions are too small to characterize. The urinary bladder is distended. Stomach/Bowel: There is narrowing of a segment of the rectosigmoid, likely related to mass effect caused by distended urinary bladder. There is mild thickened appearance of the segment of colon with surrounding stranding. The perisigmoid stranding may be related to UTI but concerning for stercoral colitis. Clinical correlation is recommended. There is probable mild associate luminal narrowing of the sigmoid colon. No evidence of small-bowel obstruction. The appendix is not visualized with certainty. No inflammatory changes identified in the right lower quadrant. Vascular/Lymphatic: The abdominal aorta and IVC unremarkable. No portal venous  gas. There is no adenopathy. Reproductive: The prostate and seminal vesicles are grossly unremarkable. Partially visualized fluid within the left inguinal canal likely related to hydrocele. Other: Mild diffuse subcutaneous edema. Musculoskeletal: Degenerative changes of the spine. No acute osseous pathology. IMPRESSION: 1. Distended urinary bladder with mild bilateral hydronephrosis and findings of right-sided pyelonephritis. Correlation with urinalysis recommended. 2. Thickened appearance of the rectosigmoid with surrounding stranding which may be related to UTI but concerning for stercoral colitis. Clinical correlation is recommended. 3. Severe fatty liver. Electronically Signed   By: Anner Crete M.D.   On: 06/04/2019 23:41   DG Chest Port 1 View  Result Date: 06/04/2019 CLINICAL DATA:  Tachycardia, bilateral lower extremity edema EXAM: PORTABLE CHEST 1 VIEW COMPARISON:  05/13/2018 FINDINGS: The heart size and mediastinal contours are within normal limits. Both lungs are clear. The visualized skeletal structures are unremarkable. IMPRESSION: No active disease. Electronically Signed   By: Randa Ngo M.D.   On: 06/04/2019 20:32    Procedures .Critical Care Performed by: Lorin Glass, PA-C Authorized by: Lorin Glass, PA-C   Critical care provider statement:    Critical care time (minutes):  45   Critical care was necessary to treat or prevent imminent or life-threatening deterioration of the following conditions:  Sepsis and metabolic crisis   Critical care was time spent personally by me on the following activities:  Discussions with consultants, evaluation of patient's response to treatment, examination of patient,  ordering and performing treatments and interventions, ordering and review of laboratory studies, ordering and review of radiographic studies, pulse oximetry, re-evaluation of patient's condition, obtaining history from patient or surrogate and review of old  charts   (including critical care time)  Medications Ordered in ED Medications  potassium chloride 10 mEq in 100 mL IVPB (10 mEq Intravenous New Bag/Given (Non-Interop) 06/04/19 2347)  vancomycin (VANCOREADY) IVPB 2000 mg/400 mL (2,000 mg Intravenous New Bag/Given (Non-Interop) 06/04/19 2348)  thiamine (B-1) injection 100 mg (has no administration in time range)  LORazepam (ATIVAN) injection 0-4 mg ( Intravenous See Alternative 06/04/19 2348)    Or  LORazepam (ATIVAN) tablet 0-4 mg (2 mg Oral Given 06/04/19 2348)  thiamine tablet 100 mg (100 mg Oral Given 06/04/19 2348)    Or  thiamine (B-1) injection 100 mg ( Intravenous See Alternative 06/04/19 2348)  lactated ringers bolus 1,000 mL (has no administration in time range)  potassium chloride SA (KLOR-CON) CR tablet 40 mEq (40 mEq Oral Given 06/04/19 2135)  ceFEPIme (MAXIPIME) 2 g in sodium chloride 0.9 % 100 mL IVPB (0 g Intravenous Stopped 06/04/19 2226)  metroNIDAZOLE (FLAGYL) IVPB 500 mg (0 mg Intravenous Stopped 06/05/19 0028)  calcium gluconate 1 g/ 50 mL sodium chloride IVPB (0 mg Intravenous Stopped 06/04/19 2252)  iohexol (OMNIPAQUE) 300 MG/ML solution 100 mL (100 mLs Intravenous Contrast Given 06/04/19 2312)    ED Course  I have reviewed the triage vital signs and the nursing notes.  Pertinent labs & imaging results that were available during my care of the patient were reviewed by me and considered in my medical decision making (see chart for details).  Clinical Course as of Jun 04 41  Sat Jun 04, 2019  2043 Consistent with labs from one year ago.    Hemoglobin(!): 7.6 [EH]  Sun Jun 05, 2019  7425 Patient was unable to empty bladder, foley placed with over 800 out.    [EH]    Clinical Course User Index [EH] Ollen Gross   MDM Rules/Calculators/A&P                     Patient is a 69 year old man who presents today for evaluation of abdominal pain, dysuria, and diarrhea.  On initial exam he is tachycardic and  mildly tachypneic.  Hemoccult is negative.  He does admit to taking Pepto-Bismol.  Labs are obtained with multiple abnormalities, hemoglobin is 7.6 which is consistent with his baseline.  He does not have a significant leukocytosis.  CMP shows hypokalemia with a potassium of 2.4, hypocalcemia at 6.4.  BNP mimimally elevated.  Lactic acid is elevated.  He is started on sepsis compatible broad-spectrum IVs antibiotics.  Blood cultures were obtained prior to the administration of antibiotics.  Unable to obtain urine sample prior to the administration of antibiotics. He did not get a 30/kg fluid bolus as his lactic was not over 4 and he was not hypotensive.  CT abdomen pelvis was obtained showing concern for right-sided pyelonephritis with significant bladder distention and questionable rectal thickening.  Additional hydronephrosis which I suspect is secondary to outlet obstruction.      He was unable to empty his bladder.  Foley catheter was placed with over 800 mL of urine out.  There is no evidence of renal stone and concern for right-sided [place].  I suspect that he has urinary retention that may be related to his significant electrolyte derangements vs alcohol abuse vs developing neurogenic bladder.  While he  has diarrhea I suspect the thickening around rectosigmoid may be related to the bladder distension.    This patient was seen as a shared visit with Dr. Rogene Houston.   I spoke with Dr. Olevia Bowens who will see patient for admission.    Note: Portions of this report may have been transcribed using voice recognition software. Every effort was made to ensure accuracy; however, inadvertent computerized transcription errors may be present   Final Clinical Impression(s) / ED Diagnoses Final diagnoses:  Sepsis without acute organ dysfunction, due to unspecified organism [facility])  Urinary retention  Hypokalemia  Hypocalcemia  Pyonephrosis    Rx / DC Orders ED Discharge Orders    None       Lorin Glass, PA-C 06/05/19 0124    Fredia Sorrow, MD 06/08/19 [phone]

## 2019-06-05 NOTE — ED Notes (Signed)
Date and time results received: 06/05/19 5:29 AM  (use smartphrase ".now" to insert current time)  Test: potassium Critical Value: 2.7    Name of Provider Notified: ortiz  Orders Received? Or Actions Taken?: Orders Received - See Orders for details

## 2019-06-05 NOTE — Progress Notes (Signed)
Cdiff stool sample rejected by lab due to being too formed. Order added for further collection.

## 2019-06-05 NOTE — Progress Notes (Signed)
Pharmacy Antibiotic Note  Darren Howard is a 69 y.o. male admitted on 06/04/2019 with sepsis.  Pharmacy has been consulted for cefepime dosing.  Plan: Cefepime 2gm IV q8 hours F/u cultures and clinical course  Height: 6\' 1"  (185.4 cm) Weight: 93 kg (205 lb) IBW/kg (Calculated) : 79.9  Temp (24hrs), Avg:98.8 F (37.1 C), Min:98.8 F (37.1 C), Max:98.8 F (37.1 C)  Recent Labs  Lab 06/04/19 2005  WBC 9.7  CREATININE 1.38*  LATICACIDVEN 2.7*    Estimated Creatinine Clearance: 59.5 mL/min (A) (by C-G formula based on SCr of 1.38 mg/dL (H)).    No Known Allergies  Thank you for allowing pharmacy to be a part of this patient's care.  Excell Seltzer Poteet 06/05/2019 2:15 AM

## 2019-06-05 NOTE — Progress Notes (Signed)
*  PRELIMINARY RESULTS* Echocardiogram 2D Echocardiogram has been performed.  Darren Howard 06/05/2019, 2:21 PM

## 2019-06-05 NOTE — H&P (Signed)
History and Physical    Darren Howard VEH:[id number] DOB: 02/11/1951 DOA: 06/04/2019  PCP: [name], MD   Patient coming from: Home.  I have personally briefly reviewed patient's old medical records in [place]  Chief Complaint: Leg swelling.  HPI: Darren Howard is a 69 y.o. male with medical history significant of alcohol abuse, chronic normocytic anemia, medication and treatment noncompliance, history of duodenal reduction, erosive gastropathy, GI bleed, glucose intolerance/type 2 diabetes, hyperlipidemia, hypertension who is coming to the emergency department due to bilateral lower extremities edema for several weeks, diarrhea with left lower quadrant tenderness since last week and dysuria, frequency and urgency for the past 3 days.  He mentioned initially to the staff that he has not taking his medications because he is "too lazy".  He also stated that he drinks a couple of shots every day.  He denies fever, chills, night sweats, sore throat, wheezing or hemoptysis.  No chest pain, palpitations, diaphoresis, PND, but complains of orthopnea and lower extremity edema.  He denies polyuria, polydipsia, polyphagia or blurred vision.  ED Course: Initial vital signs temperature 98.8 F, pulse 94, respiration 19, blood pressure 104/74 mmHg and O2 sat 100% on room air.  The patient received a 1000 mL LR bolus, unknown source antibiotic coverage with cefepime, metronidazole and vancomycin.  He also received oral and IV potassium replacement.  Urinalysis was normal.  UDS negative.  White count is 9.7, hemoglobin 7.6 g/dL and platelets 387.  PT 15.3 and INR 1.2.  Lipase was 12 units/L.  Alcohol less than 10 mg/dL.  Lactic acid was 2.7, potassium 2.4, chloride 114, CO2 15 mmol/L.  He has a normal anion gap.  BUN was 29, creatinine 1.38, glucose 124 and calcium 6.4 mg/dL.  Total protein 6.0 and albumin 2.6 g/dL.  AST was 43, ALT 32, alkaline phosphatase 111 and total bilirubin 1.0.  Imaging: 1  view chest radiograph did not show any active disease.  CT abdomen and pelvis showed distended urinary bladder with mild bilateral hydronephrosis and finding of right-sided pyelonephritis.  Thickening appearance of the rectosigmoid with surrounding stranding which may be related to UTI, but concerning for stercoral colitis.  Please see images and full radiology report for further detail.  Review of Systems: As per HPI otherwise all other systems reviewed and are negative.  Past Medical History:  Diagnosis Date  . Alcohol abuse   . Anemia   . Diabetes mellitus without complication ([place])   . Duodenal erosion   . Erosive gastropathy   . GI bleed 04/2018  . Glucose intolerance (impaired glucose tolerance)   . Hyperlipemia   . Hypertension   . Noncompliance     Past Surgical History:  Procedure Laterality Date  . BIOPSY  05/17/2018   Procedure: BIOPSY;  Surgeon: Daneil Dolin, MD;  Location: [facility];  Service: Endoscopy;;  . CARDIAC CATHETERIZATION    . COLONOSCOPY WITH PROPOFOL N/A 06/01/2018   Procedure: COLONOSCOPY WITH PROPOFOL;  Surgeon: Daneil Dolin, MD;  Location: [facility];  Service: Endoscopy;  Laterality: N/A;  . ESOPHAGOGASTRODUODENOSCOPY (EGD) WITH PROPOFOL N/A 05/17/2018   Dr. Gala Romney: normal esophagus, erosive gastropathy s/p biopsy, duodenal erosions, felt to be NSAID effect  . HERNIA REPAIR    . POLYPECTOMY  06/01/2018   Procedure: POLYPECTOMY;  Surgeon: Daneil Dolin, MD;  Location: [facility];  Service: Endoscopy;;  colon    Social History  reports that he has been smoking cigarettes. He has a 15.00 pack-year  smoking history. He has never used smokeless tobacco. He reports current alcohol use of about 2.0 standard drinks of alcohol per week. He reports that he does not use drugs.  No Known Allergies  Family History  Problem Relation Age of Onset  . Congestive Heart Failure Mother   . Congestive Heart Failure Father   . Cancer Brother        unknown  kind   . Colon cancer Neg Hx   . Colon polyps Neg Hx    Prior to Admission medications   Medication Sig Start Date End Date Taking? Authorizing Provider  amLODipine (NORVASC) 2.5 MG tablet Take 4 tablets (10 mg total) by mouth daily. 05/30/18   Orson Eva, MD  atorvastatin (LIPITOR) 10 MG tablet Take 10 mg by mouth at bedtime.    [provider]  calcium carbonate (TUMS - DOSED IN MG ELEMENTAL CALCIUM) 500 MG chewable tablet Chew 2 tablets (400 mg of elemental calcium total) by mouth 2 (two) times daily with a meal. 05/30/18   Tat, Shanon Brow, MD  carvedilol (COREG) 25 MG tablet Take 25 mg by mouth 2 (two) times daily.    [provider]  ferrous sulfate 325 (65 FE) MG tablet Take 1 tablet (325 mg total) by mouth daily with breakfast. 05/31/18   Tat, Shanon Brow, MD  hydrocortisone (ANUSOL-HC) 2.5 % rectal cream Place rectally 3 (three) times daily. Apply to anorectum every 6 hours x 10 days 06/01/18   Tat, Shanon Brow, MD  magnesium oxide (MAG-OX) 400 (241.3 Mg) MG tablet Take 1 tablet (400 mg total) by mouth 2 (two) times daily. 06/01/18   Orson Eva, MD  metFORMIN (GLUCOPHAGE-XR) 500 MG 24 hr tablet Take 500 mg by mouth 2 (two) times daily.     [provider]  pantoprazole (PROTONIX) 40 MG tablet Take 1 tablet (40 mg total) by mouth daily for 30 days. 05/19/18 06/18/18  Manuella Ghazi, Pratik D, DO  polyethylene glycol (MIRALAX / GLYCOLAX) 17 g packet Take 17 g by mouth daily.    [provider]  polyethylene glycol (MIRALAX / GLYCOLAX) 17 g packet Take 17 g by mouth daily. 06/01/18   Orson Eva, MD  senna (SENOKOT) 8.6 MG TABS tablet Take 1 tablet by mouth 2 (two) times daily.    [provider]  senna (SENOKOT) 8.6 MG TABS tablet Take 2 tablets (17.2 mg total) by mouth daily. 06/01/18   Orson Eva, MD  Vitamin D, Ergocalciferol, (DRISDOL) 1.25 MG (50000 UT) CAPS capsule Take 1 capsule (50,000 Units total) by mouth every 7 (seven) days. Next dose 06/05/18 06/05/18   Orson Eva, MD     Physical Exam: Vitals:   06/04/19 2300 06/05/19 0145 06/05/19 0315 06/05/19 0330  BP: (!) 133/97   133/80  Pulse: 96  (!) 111   Resp: 17 17    Temp:      SpO2: 100%  100%   Weight:      Height:        Constitutional: NAD, calm, comfortable Eyes: PERRL, lids and conjunctivae normal ENMT: Mucous membranes are moist. Posterior pharynx clear of any exudate or lesions. Neck: normal, supple, no masses, no thyromegaly Respiratory: clear to auscultation bilaterally, no wheezing, no crackles. Normal respiratory effort. No accessory muscle use.  Cardiovascular: Tachycardic at 103 bpm, no murmurs / rubs / gallops.  3+ lower extremities pitting edema. 2+ pedal pulses. No carotid bruits.  Abdomen: Distended due to ascites.  BS positive.  Soft, left flank tenderness, no guarding or  rebound, no masses palpated. No hepatosplenomegaly. Bowel sounds positive.  Musculoskeletal: Generalized weakness.  No clubbing / cyanosis. Good ROM, no contractures. Normal muscle tone.  Skin: Areas of hyperpigmentation and small ecchymosis on extremities. Neurologic: CN 2-12 grossly intact. Sensation intact, DTR normal. Strength 5/5 in all 4.  Psychiatric: Normal judgment and insight. Alert and oriented x 3.  Mildly anxious mood.   Labs on Admission: I have personally reviewed following labs and imaging studies  CBC: Recent Labs  Lab 06/04/19 2005  WBC 9.7  NEUTROABS 7.9*  HGB 7.6*  HCT 23.1*  MCV 93.9  PLT 540    Basic Metabolic Panel: Recent Labs  Lab 06/04/19 2005  NA 139  K 2.4*  CL 114*  CO2 15*  GLUCOSE 124*  BUN 29*  CREATININE 1.38*  CALCIUM 6.4*    GFR: Estimated Creatinine Clearance: 59.5 mL/min (A) (by C-G formula based on SCr of 1.38 mg/dL (H)).  Liver Function Tests: Recent Labs  Lab 06/04/19 2005  AST 43*  ALT 32  ALKPHOS 115  BILITOT 1.0  PROT 6.0*  ALBUMIN 2.6*    Urine analysis:    Component Value Date/Time   COLORURINE YELLOW 06/05/2019 0031   APPEARANCEUR  CLEAR 06/05/2019 0031   LABSPEC 1.017 06/05/2019 0031   PHURINE 5.0 06/05/2019 0031   GLUCOSEU NEGATIVE 06/05/2019 0031   HGBUR NEGATIVE 06/05/2019 0031   BILIRUBINUR NEGATIVE 06/05/2019 0031   KETONESUR NEGATIVE 06/05/2019 0031   PROTEINUR NEGATIVE 06/05/2019 0031   UROBILINOGEN 0.2 06/14/2008 1930   NITRITE NEGATIVE 06/05/2019 0031   LEUKOCYTESUR NEGATIVE 06/05/2019 0031    Radiological Exams on Admission: CT Abdomen Pelvis W Contrast  Result Date: 06/04/2019 CLINICAL DATA:  69 year old male with abdominal distension and diarrhea. Concern for acute diverticulitis. EXAM: CT ABDOMEN AND PELVIS WITH CONTRAST TECHNIQUE: Multidetector CT imaging of the abdomen and pelvis was performed using the standard protocol following bolus administration of intravenous contrast. CONTRAST:  16mL OMNIPAQUE IOHEXOL 300 MG/ML  SOLN COMPARISON:  None. FINDINGS: Lower chest: The visualized lung bases are clear. No intra-abdominal free air or free fluid. Hepatobiliary: Severe fatty infiltration of the liver. No intrahepatic biliary ductal dilatation. The gallbladder is unremarkable. Pancreas: Atrophic pancreas. No Accu to inflammatory changes. No dilatation of the main pancreatic duct. Spleen: Normal in size without focal abnormality. Adrenals/Urinary Tract: The adrenal glands are unremarkable. There is mild bilateral hydronephrosis, right greater left. There is enhancement of the right renal urothelium concerning for pyelonephritis. Correlation with urinalysis recommended. There is slight delayed enhancement of the right renal parenchyma. Multiple right renal cysts measure up to 4 cm in the interpolar aspect of the right kidney. Additional subcentimeter right renal hypodense lesions are too small to characterize. The urinary bladder is distended. Stomach/Bowel: There is narrowing of a segment of the rectosigmoid, likely related to mass effect caused by distended urinary bladder. There is mild thickened appearance of the  segment of colon with surrounding stranding. The perisigmoid stranding may be related to UTI but concerning for stercoral colitis. Clinical correlation is recommended. There is probable mild associate luminal narrowing of the sigmoid colon. No evidence of small-bowel obstruction. The appendix is not visualized with certainty. No inflammatory changes identified in the right lower quadrant. Vascular/Lymphatic: The abdominal aorta and IVC unremarkable. No portal venous gas. There is no adenopathy. Reproductive: The prostate and seminal vesicles are grossly unremarkable. Partially visualized fluid within the left inguinal canal likely related to hydrocele. Other: Mild diffuse subcutaneous edema. Musculoskeletal: Degenerative changes of the spine. No  acute osseous pathology. IMPRESSION: 1. Distended urinary bladder with mild bilateral hydronephrosis and findings of right-sided pyelonephritis. Correlation with urinalysis recommended. 2. Thickened appearance of the rectosigmoid with surrounding stranding which may be related to UTI but concerning for stercoral colitis. Clinical correlation is recommended. 3. Severe fatty liver. Electronically Signed   By: Anner Crete M.D.   On: 06/04/2019 23:41   DG Chest Port 1 View  Result Date: 06/04/2019 CLINICAL DATA:  Tachycardia, bilateral lower extremity edema EXAM: PORTABLE CHEST 1 VIEW COMPARISON:  05/13/2018 FINDINGS: The heart size and mediastinal contours are within normal limits. Both lungs are clear. The visualized skeletal structures are unremarkable. IMPRESSION: No active disease. Electronically Signed   By: Randa Ngo M.D.   On: 06/04/2019 20:32    EKG: Independently reviewed. Vent. rate 120 BPM PR interval * ms QRS duration 90 ms QT/QTc 325/438 ms P-R-T axes * -20 * Atrial fibrillation Paired ventricular premature complexes Borderline left axis deviation Low voltage, precordial leads Borderline T wave abnormalities  Assessment/Plan Principal  Problem:   Pyelonephritis of right kidney Admit to stepdown/inpatient. Continue ceftriaxone 1 g IVPB daily. Culture and sensitivity. Follow-up urine culture and sensitivity. Will continue metronidazole for stercoral colitis coverage.  Active Problems:   Alcohol abuse CIWA protocol with lorazepam detox. Supplement magnesium, folate, MVI and thiamine. Consult transitional care team.    Hypertension Off medications. Normotensive at this time. Monitor blood pressure.    Normocytic anemia Anemia panel pending. Monitor H&H.    DM2 (diabetes mellitus, type 2) (HCC) Carbohydrate modified diet. CBG monitoring with RI SS.    Hypokalemia Replacing. Supplemental magnesium for Follow potassium level.    Hypocalcemia Corrected to hypoalbuminemia is 7.2 mg/dL. Chronic hypomagnesemia might be causing PTH resistance. Correct magnesium and follow-up calcium level.    Tobacco use Tobacco cessation advised. Nicotine replacement therapy ordered. Staff to provide tobacco cessation information   DVT prophylaxis: SCDs. Code Status:   Full code. Family Communication: Disposition Plan:   Patient is from:  Home.  Anticipated DC to:  Home.  Anticipated DC date:  06/06/2019.  Anticipated DC barriers: Clinical improvement. Consults called: Admission status:  Observation/stepdown.    Severity of Illness: Medium to high.  Reubin Milan MD [facility]  How to contact the [facility] Attending or Consulting provider [place] or covering provider during after hours [place], for this patient?   1. Check the care team in [facility] and look for a) attending/consulting [facility] provider listed and b) the [facility] team listed 2. Log into [email or web address] and use [place]'s universal password to access. If you do not have the password, please contact the hospital operator. 3. Locate the [facility] provider you are looking for under [facility] and page to a number that you can be directly reached. 4. If you  still have difficulty reaching the provider, please page the [facility] (Director on Call) for the Hospitalists listed on amion for assistance.  06/05/2019, 4:00 AM   This document was prepared using Dragon voice recognition software and may contain some unintended transcription errors.

## 2019-06-06 LAB — COMPREHENSIVE METABOLIC PANEL
ALT: 27 U/L (ref 0–44)
AST: 33 U/L (ref 15–41)
Albumin: 2.2 g/dL — ABNORMAL LOW (ref 3.5–5.0)
Alkaline Phosphatase: 89 U/L (ref 38–126)
Anion gap: 7 (ref 5–15)
BUN: 22 mg/dL (ref 8–23)
CO2: 18 mmol/L — ABNORMAL LOW (ref 22–32)
Calcium: 6.9 mg/dL — ABNORMAL LOW (ref 8.9–10.3)
Chloride: 120 mmol/L — ABNORMAL HIGH (ref 98–111)
Creatinine, Ser: 0.99 mg/dL (ref 0.61–1.24)
GFR calc Af Amer: >60 mL/min (ref >60)
GFR calc non Af Amer: >60 mL/min (ref >60)
Glucose, Bld: 108 mg/dL — ABNORMAL HIGH (ref 70–99)
Potassium: 2.7 mmol/L — CL (ref 3.5–5.1)
Sodium: 145 mmol/L (ref 135–145)
Total Bilirubin: 0.7 mg/dL (ref 0.3–1.2)
Total Protein: 5.1 g/dL — ABNORMAL LOW (ref 6.5–8.1)

## 2019-06-06 LAB — CBC
HCT: 20.3 % — ABNORMAL LOW (ref 39.0–52.0)
Hemoglobin: 6.5 g/dL — CL (ref 13.0–17.0)
MCH: 30.4 pg (ref 26.0–34.0)
MCHC: 32 g/dL (ref 30.0–36.0)
MCV: 94.9 fL (ref 80.0–100.0)
Platelets: 310 10*3/uL (ref 150–400)
RBC: 2.14 MIL/uL — ABNORMAL LOW (ref 4.22–5.81)
RDW: 19 % — ABNORMAL HIGH (ref 11.5–15.5)
WBC: 9.4 10*3/uL (ref 4.0–10.5)
nRBC: 0 % (ref 0.0–0.2)

## 2019-06-06 LAB — MAGNESIUM: Magnesium: 1.9 mg/dL (ref 1.7–2.4)

## 2019-06-06 LAB — RESPIRATORY PANEL BY RT PCR (FLU A&B, COVID)
Influenza A by PCR: NEGATIVE
Influenza B by PCR: NEGATIVE
SARS Coronavirus 2 by RT PCR: NEGATIVE

## 2019-06-06 LAB — PREPARE RBC (CROSSMATCH)

## 2019-06-06 LAB — OCCULT BLOOD X 1 CARD TO LAB, STOOL: Fecal Occult Bld: POSITIVE — AB

## 2019-06-06 MED ORDER — HYDROCORTISONE ACETATE 25 MG RE SUPP
25.0000 mg | Freq: Two times a day (BID) | RECTAL | Status: DC
  Administered 2019-06-06 – 2019-06-07 (×2): 25 mg via RECTAL
  Filled 2019-06-06 (×2): qty 1

## 2019-06-06 MED ORDER — POTASSIUM CHLORIDE CRYS ER 20 MEQ PO TBCR
40.0000 meq | EXTENDED_RELEASE_TABLET | ORAL | Status: AC
  Administered 2019-06-06 (×2): 40 meq via ORAL
  Filled 2019-06-06 (×2): qty 2

## 2019-06-06 MED ORDER — SODIUM BICARBONATE 8.4 % IV SOLN
INTRAVENOUS | Status: AC
  Filled 2019-06-06: qty 100

## 2019-06-06 MED ORDER — SODIUM BICARBONATE 8.4 % IV SOLN
INTRAVENOUS | Status: DC
  Filled 2019-06-06 (×7): qty 1000

## 2019-06-06 MED ORDER — FUROSEMIDE 10 MG/ML IJ SOLN
20.0000 mg | Freq: Once | INTRAMUSCULAR | Status: AC
  Administered 2019-06-06: 20 mg via INTRAVENOUS
  Filled 2019-06-06: qty 2

## 2019-06-06 MED ORDER — LACTULOSE 10 GM/15ML PO SOLN
30.0000 g | Freq: Once | ORAL | Status: AC
  Administered 2019-06-06: 30 g via ORAL
  Filled 2019-06-06: qty 60

## 2019-06-06 MED ORDER — AMOXICILLIN-POT CLAVULANATE 875-125 MG PO TABS
1.0000 | ORAL_TABLET | Freq: Two times a day (BID) | ORAL | Status: AC
  Administered 2019-06-06 – 2019-06-12 (×13): 1 via ORAL
  Filled 2019-06-06 (×14): qty 1

## 2019-06-06 MED ORDER — SODIUM CHLORIDE 0.9% IV SOLUTION: Freq: Once | INTRAVENOUS | Status: AC

## 2019-06-06 MED ORDER — KCL IN DEXTROSE-NACL 40-5-0.45 MEQ/L-%-% IV SOLN
INTRAVENOUS | Status: AC
  Filled 2019-06-06: qty 1000

## 2019-06-06 NOTE — Progress Notes (Signed)
CRITICAL VALUE ALERT  Critical Value:  Hgb 6.5, K 2.7  Date & Time Notied:  06/06/2019 0745  Provider Notified: Maurene Capes, MD  Orders Received/Actions taken: awaiting further instructions

## 2019-06-06 NOTE — Progress Notes (Addendum)
Patient Demographics:    Darren Howard, is a 69 y.o. male, DOB - Sep 29, 1950, IYM:[id number]  Admit date - 06/04/2019   Admitting Physician Kathie Dike, MD  Outpatient Primary MD for the patient is [name], MD  LOS - 1   Chief Complaint  Patient presents with  . Leg Swelling        Subjective:    Darren Howard today has no fevers, no emesis,  No chest pain, remains intermittently confused -Son visited at bedside, questions answered -Hgb dropped to 6.5, no obvious bleeding noted  Assessment  & Plan :    Principal Problem:   Pyelonephritis of right kidney Active Problems:   Hypertension   Normocytic anemia   DM2 (diabetes mellitus, type 2) (HCC)   Hypokalemia   Hypocalcemia   Alcohol abuse   Tobacco use   Pyelonephritis   Brief Summary:- 69 y.o. male with medical history significant of alcohol abuse, chronic normocytic anemia, medication and treatment noncompliance, history of duodenal reduction, erosive gastropathy, GI bleed, glucose intolerance/type 2 diabetes, hyperlipidemia, hypertension admitted on 06/05/2019 with possible right-sided pyelonephritis, as well as stercoral colitis and found to have alcohol withdrawal symptoms  A/p  1) stercoral colitis--- patient previously received vancomycin, Rocephin and cefepime as well as Flagyl ----we will avoid Flagyl in this alcoholic male, treat empirically with Augmentin -Lactulose as ordered, avoid constipation  2) alcohol abuse with DTs--- patient with episodes of confusion, disorientation and restlessness consistent with alcohol withdrawal ----- continue folic acid, thiamine/multivitamin and lorazepam per CIWA protocol  3)DM2--last A1c 6.9 reflecting excellent DM control PTA -Metformin on hold Use Novolog/Humalog Sliding scale insulin with Accu-Cheks/Fingersticks as ordered   4) hypocalcemia/hypokalemia/hypomagnesemia and  hypoalbuminemia----partly due to poor nutritional intake in an alcoholic male----replace and recheck  5)HTN--decrease Coreg to 12.5 mg twice daily, hold amlodipine  6) suspected right-sided pyelonephritis--- urine and blood cultures negative, antibiotics as above #1  7) acute on chronic symptomatic anemia----Hgb down to 6.5, patient tachycardic, transfused 1 unit of PRBC -Stool occult blood is positive -We will get GI input  Disposition/Need for in-Hospital Stay- patient unable to be discharged at this time due to ---alcohol withdrawal symptoms requiring IV lorazepam and IV hydration pending improvement in mental status to allow for adequate oral intake -Patient From: home  D/C Place: home  Barriers: Not Clinically Stable- --- DTs  Code Status : full  Family Communication:   -Discussed with son Darren Howard who is visiting  Consults  :  na  DVT Prophylaxis  :   - SCDs   Lab Results  Component Value Date   PLT 310 06/06/2019    Inpatient Medications  Scheduled Meds: . sodium chloride   Intravenous Once  . amoxicillin-clavulanate  1 tablet Oral Q12H  . carvedilol  12.5 mg Oral BID  . Chlorhexidine Gluconate Cloth  6 each Topical Daily  . folic acid  1 mg Oral Daily  . furosemide  20 mg Intravenous Once  . hydrocortisone  25 mg Rectal BID  . lactulose  30 g Oral Once  . multivitamin with minerals  1 tablet Oral Daily  . pantoprazole  40 mg Oral Daily  . potassium chloride  40 mEq Oral Q3H  . tamsulosin  0.4 mg Oral QPC supper  .  thiamine  100 mg Oral Daily   Or  . thiamine  100 mg Intravenous Daily   Continuous Infusions: . dextrose 5 % 1,000 mL with potassium chloride 20 mEq, sodium bicarbonate 50 mEq infusion     PRN Meds:.LORazepam **OR** LORazepam, polyethylene glycol    Anti-infectives (From admission, onward)   Start     Dose/Rate Route Frequency Ordered Stop   06/06/19 2200  amoxicillin-clavulanate (AUGMENTIN) 875-125 MG per tablet 1 tablet     1 tablet Oral  Every 12 hours 06/06/19 1822     06/05/19 0645  metroNIDAZOLE (FLAGYL) IVPB 500 mg     500 mg 100 mL/hr over 60 Minutes Intravenous  Once 06/05/19 0640 06/05/19 0954   06/05/19 0600  ceFEPIme (MAXIPIME) 2 g in sodium chloride 0.9 % 100 mL IVPB  Status:  Discontinued     2 g 200 mL/hr over 30 Minutes Intravenous Every 8 hours 06/05/19 0217 06/05/19 0357   06/05/19 0400  cefTRIAXone (ROCEPHIN) 1 g in sodium chloride 0.9 % 100 mL IVPB  Status:  Discontinued     1 g 200 mL/hr over 30 Minutes Intravenous Every 24 hours 06/05/19 0357 06/06/19 1822   06/04/19 2130  vancomycin (VANCOREADY) IVPB 2000 mg/400 mL     2,000 mg 200 mL/hr over 120 Minutes Intravenous  Once 06/04/19 2130 06/05/19 0216   06/04/19 2115  ceFEPIme (MAXIPIME) 2 g in sodium chloride 0.9 % 100 mL IVPB     2 g 200 mL/hr over 30 Minutes Intravenous  Once 06/04/19 2105 06/04/19 2226   06/04/19 2115  metroNIDAZOLE (FLAGYL) IVPB 500 mg     500 mg 100 mL/hr over 60 Minutes Intravenous  Once 06/04/19 2105 06/05/19 0028   06/04/19 2115  vancomycin (VANCOCIN) IVPB 1000 mg/200 mL premix  Status:  Discontinued     1,000 mg 200 mL/hr over 60 Minutes Intravenous  Once 06/04/19 2105 06/04/19 2130        Objective:   Vitals:   06/06/19 1400 06/06/19 1500 06/06/19 1604 06/06/19 1700  BP: (!) 106/31 128/72  (!) 126/94  Pulse: 81 80 85 85  Resp: 15 18 (!) 22 19  Temp:   98.7 F (37.1 C)   TempSrc:   Oral   SpO2: 100% 100% 100% 100%  Weight:      Height:        Wt Readings from Last 3 Encounters:  06/06/19 97.8 kg  05/31/18 100 kg  05/17/18 93.5 kg     Intake/Output Summary (Last 24 hours) at 06/06/2019 1824 Last data filed at 06/06/2019 1822 Gross per 24 hour  Intake 2984.57 ml  Output 800 ml  Net 2184.57 ml     Physical Exam  Gen:-Intermittently confused and disoriented  HEENT:- [place].AT, No sclera icterus Neck-Supple Neck,No JVD,.  Lungs-  CTAB , fair symmetrical air movement CV- S1, S2 normal, regular ,  Abd-   +ve B.Sounds, Abd Soft, No tenderness, no CVA area tenderness Extremity/Skin:- No  edema, pedal pulses present  Psych-confused, disoriented neuro-generalized weakness, no new focal deficits, patient does have tremors consistent with alcohol withdrawal   Data Review:   Micro Results Recent Results (from the past 240 hour(s))  Culture, blood (routine x 2)     Status: None (Preliminary result)   Collection Time: 06/04/19  8:05 PM   Specimen: Left Antecubital; Blood  Result Value Ref Range Status   Specimen Description LEFT ANTECUBITAL  Final   Special Requests   Final    BOTTLES DRAWN  AEROBIC AND ANAEROBIC Blood Culture adequate volume   Culture   Final    NO GROWTH 2 DAYS Performed at [facility], [address], [place], [place] [address]    Report Status PENDING  Incomplete  Culture, blood (routine x 2)     Status: None (Preliminary result)   Collection Time: 06/04/19  8:49 PM   Specimen: Right Antecubital; Blood  Result Value Ref Range Status   Specimen Description RIGHT ANTECUBITAL  Final   Special Requests   Final    BOTTLES DRAWN AEROBIC AND ANAEROBIC Blood Culture adequate volume   Culture   Final    NO GROWTH 2 DAYS Performed at [facility], [address] [place], [place] [address]    Report Status PENDING  Incomplete  MRSA PCR Screening     Status: None   Collection Time: 06/05/19  6:46 PM   Specimen: Nasopharyngeal  Result Value Ref Range Status   MRSA by PCR NEGATIVE NEGATIVE Final    Comment:        The GeneXpert MRSA Assay (FDA approved for NASAL specimens only), is one component of a comprehensive MRSA colonization surveillance program. It is not intended to diagnose MRSA infection nor to guide or monitor treatment for MRSA infections. Performed at [facility], [address], [place],  [address]   Respiratory Panel by RT PCR (Flu A&B, Covid) - Nasopharyngeal Swab     Status: None   Collection Time: 06/06/19  2:51 AM   Specimen: Nasopharyngeal  Swab  Result Value Ref Range Status   SARS Coronavirus 2 by RT PCR NEGATIVE NEGATIVE Final    Comment: (NOTE) SARS-CoV-2 target nucleic acids are NOT DETECTED. The SARS-CoV-2 RNA is generally detectable in upper respiratoy specimens during the acute phase of infection. The lowest concentration of SARS-CoV-2 viral copies this assay can detect is 131 copies/mL. A negative result does not preclude SARS-Cov-2 infection and should not be used as the sole basis for treatment or other patient management decisions. A negative result may occur with  improper specimen collection/handling, submission of specimen other than nasopharyngeal swab, presence of viral mutation(s) within the areas targeted by this assay, and inadequate number of viral copies (<131 copies/mL). A negative result must be combined with clinical observations, patient history, and epidemiological information. The expected result is Negative. Fact Sheet for Patients:  PinkCheek.be Fact Sheet for Healthcare Providers:  GravelBags.it This test is not yet ap proved or cleared by the [place] FDA and  has been authorized for detection and/or diagnosis of SARS-CoV-2 by FDA under an Emergency Use Authorization (EUA). This EUA will remain  in effect (meaning this test can be used) for the duration of the COVID-19 declaration under Section 564(b)(1) of the Act, 21 U.S.C. section 360bbb-3(b)(1), unless the authorization is terminated or revoked sooner.    Influenza A by PCR NEGATIVE NEGATIVE Final   Influenza B by PCR NEGATIVE NEGATIVE Final    Comment: (NOTE) The Xpert Xpress SARS-CoV-2/FLU/RSV assay is intended as an aid in  the diagnosis of influenza from Nasopharyngeal swab specimens and  should not be used as a sole basis for treatment. Nasal washings and  aspirates are unacceptable for Xpert Xpress SARS-CoV-2/FLU/RSV  testing. Fact Sheet for  Patients: PinkCheek.be Fact Sheet for Healthcare Providers: GravelBags.it This test is not yet approved or cleared by the [place] FDA and  has been authorized for detection and/or diagnosis of SARS-CoV-2 by  FDA under an Emergency Use Authorization (EUA). This EUA will remain  in effect (meaning this test can be used) for the duration of the  Covid-19 declaration under Section 564(b)(1) of the Act, 21  U.S.C. section 360bbb-3(b)(1), unless the authorization is  terminated or revoked. Performed at [facility], [address] [place], [place] [address]     Radiology Reports CT Abdomen Pelvis W Contrast  Result Date: 06/04/2019 CLINICAL DATA:  69 year old male with abdominal distension and diarrhea. Concern for acute diverticulitis. EXAM: CT ABDOMEN AND PELVIS WITH CONTRAST TECHNIQUE: Multidetector CT imaging of the abdomen and pelvis was performed using the standard protocol following bolus administration of intravenous contrast. CONTRAST:  177mL OMNIPAQUE IOHEXOL 300 MG/ML  SOLN COMPARISON:  None. FINDINGS: Lower chest: The visualized lung bases are clear. No intra-abdominal free air or free fluid. Hepatobiliary: Severe fatty infiltration of the liver. No intrahepatic biliary ductal dilatation. The gallbladder is unremarkable. Pancreas: Atrophic pancreas. No Accu to inflammatory changes. No dilatation of the main pancreatic duct. Spleen: Normal in size without focal abnormality. Adrenals/Urinary Tract: The adrenal glands are unremarkable. There is mild bilateral hydronephrosis, right greater left. There is enhancement of the right renal urothelium concerning for pyelonephritis. Correlation with urinalysis recommended. There is slight delayed enhancement of the right renal parenchyma. Multiple right renal cysts measure up to 4 cm in the interpolar aspect of the right kidney. Additional subcentimeter right renal hypodense lesions are too  small to characterize. The urinary bladder is distended. Stomach/Bowel: There is narrowing of a segment of the rectosigmoid, likely related to mass effect caused by distended urinary bladder. There is mild thickened appearance of the segment of colon with surrounding stranding. The perisigmoid stranding may be related to UTI but concerning for stercoral colitis. Clinical correlation is recommended. There is probable mild associate luminal narrowing of the sigmoid colon. No evidence of small-bowel obstruction. The appendix is not visualized with certainty. No inflammatory changes identified in the right lower quadrant. Vascular/Lymphatic: The abdominal aorta and IVC unremarkable. No portal venous gas. There is no adenopathy. Reproductive: The prostate and seminal vesicles are grossly unremarkable. Partially visualized fluid within the left inguinal canal likely related to hydrocele. Other: Mild diffuse subcutaneous edema. Musculoskeletal: Degenerative changes of the spine. No acute osseous pathology. IMPRESSION: 1. Distended urinary bladder with mild bilateral hydronephrosis and findings of right-sided pyelonephritis. Correlation with urinalysis recommended. 2. Thickened appearance of the rectosigmoid with surrounding stranding which may be related to UTI but concerning for stercoral colitis. Clinical correlation is recommended. 3. Severe fatty liver. Electronically Signed   By: Anner Crete M.D.   On: 06/04/2019 23:41   US Venous Img Lower Bilateral (DVT)  Result Date: 06/05/2019 CLINICAL DATA:  Bilateral lower extremity pain and edema for 1 month. EXAM: BILATERAL LOWER EXTREMITY VENOUS DOPPLER ULTRASOUND TECHNIQUE: Gray-scale sonography with compression, as well as color and duplex ultrasound, were performed to evaluate the deep venous system(s) from the level of the common femoral vein through the popliteal and proximal calf veins. COMPARISON:  None. FINDINGS: VENOUS Normal compressibility of the common  femoral, superficial femoral, and popliteal veins, as well as the visualized calf veins. Visualized portions of profunda femoral vein and great saphenous vein unremarkable. No filling defects to suggest DVT on grayscale or color Doppler imaging. Doppler waveforms show normal direction of venous flow, normal respiratory phasicity and response to augmentation. OTHER None. Limitations: none IMPRESSION: No femoropopliteal DVT nor evidence of DVT within the visualized calf veins. If clinical symptoms are inconsistent or if there are persistent or worsening symptoms, further imaging (possibly involving the iliac  veins) may be warranted. Electronically Signed   By: Abigail Miyamoto M.D.   On: 06/05/2019 11:44   DG Chest Port 1 View  Result Date: 06/04/2019 CLINICAL DATA:  Tachycardia, bilateral lower extremity edema EXAM: PORTABLE CHEST 1 VIEW COMPARISON:  05/13/2018 FINDINGS: The heart size and mediastinal contours are within normal limits. Both lungs are clear. The visualized skeletal structures are unremarkable. IMPRESSION: No active disease. Electronically Signed   By: Randa Ngo M.D.   On: 06/04/2019 20:32   ECHOCARDIOGRAM COMPLETE  Result Date: 06/05/2019    ECHOCARDIOGRAM REPORT   Patient Name:   GREYSON PEAVY Date of Exam: 06/05/2019 Medical Rec #:  [id number]     Height:       73.0 in Accession #:    [id number]    Weight:       205.0 lb Date of Birth:  1950/03/11    BSA:          2.174 m Patient Age:    61 years      BP:           152/86 mmHg Patient Gender: M             HR:           90 bpm. Exam Location:  [facility] Procedure: 2D Echo, Cardiac Doppler and Color Doppler Indications:    Lower extremity edema [218969]                 Alcohol abuse [294765]  History:        Patient has no prior history of Echocardiogram examinations.                 Risk Factors:Hypertension, Diabetes, Current Smoker and                 Dyslipidemia.  Sonographer:    Alvino Chapel RCS Referring Phys: [place]  1. Left ventricular ejection fraction, by estimation, is 60 to 65%. The left ventricle has normal function. The left ventricle has no regional wall motion abnormalities. There is moderate left ventricular hypertrophy. Left ventricular diastolic parameters were normal.  2. Right ventricular systolic function is normal. The right ventricular size is normal.  3. Left atrial size was mildly dilated.  4. The mitral valve is normal in structure. No evidence of mitral valve regurgitation.  5. The aortic valve is tricuspid. Aortic valve regurgitation is not visualized. No aortic stenosis is present. FINDINGS  Left Ventricle: Left ventricular ejection fraction, by estimation, is 60 to 65%. The left ventricle has normal function. The left ventricle has no regional wall motion abnormalities. The left ventricular internal cavity size was normal in size. There is  moderate left ventricular hypertrophy. Left ventricular diastolic parameters were normal. Right Ventricle: The right ventricular size is normal. Right vetricular wall thickness was not assessed. Right ventricular systolic function is normal. There is mildly elevated pulmonary artery systolic pressure. The tricuspid regurgitant velocity is 2.55 m/s, and with an assumed right atrial pressure of 8 mmHg, the estimated right ventricular systolic pressure is 46.5 mmHg. Left Atrium: Left atrial size was mildly dilated. Right Atrium: Right atrial size was normal in size. Pericardium: Trivial pericardial effusion is present. Presence of pericardial fat pad. Mitral Valve: The mitral valve is normal in structure. No evidence of mitral valve regurgitation. Tricuspid Valve: The tricuspid valve is normal in structure. Tricuspid valve regurgitation is trivial. Aortic Valve: The aortic valve is tricuspid. Aortic valve regurgitation is not visualized.  No aortic stenosis is present. Pulmonic Valve: The pulmonic valve was not well visualized. Pulmonic valve regurgitation is  not visualized. Aorta: The aortic root is normal in size and structure. IAS/Shunts: The interatrial septum was not well visualized.  LEFT VENTRICLE PLAX 2D LVIDd:         4.85 cm  Diastology LVIDs:         3.13 cm  LV e' lateral:   10.80 cm/s LV PW:         1.35 cm  LV E/e' lateral: 9.4 LV IVS:        1.41 cm  LV e' medial:    9.03 cm/s LVOT diam:     2.00 cm  LV E/e' medial:  11.2 LV SV:         76 LV SV Index:   35 LVOT Area:     3.14 cm  RIGHT VENTRICLE RV Mid diam:    3.04 cm RV S prime:     19.60 cm/s TAPSE (M-mode): 2.1 cm LEFT ATRIUM             Index LA diam:        4.10 cm 1.89 cm/m LA Vol (A2C):   95.3 ml 43.83 ml/m LA Vol (A4C):   66.2 ml 30.45 ml/m LA Biplane Vol: 80.8 ml 37.16 ml/m  AORTIC VALVE LVOT Vmax:   112.00 cm/s LVOT Vmean:  79.100 cm/s LVOT VTI:    0.241 m  AORTA Ao Root diam: 3.10 cm MITRAL VALVE                TRICUSPID VALVE MV Area (PHT): 3.07 cm     TR Peak grad:   26.0 mmHg MV Decel Time: 247 msec     TR Vmax:        255.00 cm/s MV E velocity: 101.00 cm/s MV A velocity: 95.10 cm/s   SHUNTS MV E/A ratio:  1.06         Systemic VTI:  0.24 m                             Systemic Diam: 2.00 cm Oswaldo Milian MD Electronically signed by Oswaldo Milian MD Signature Date/Time: 06/05/2019/10:46:22 PM    Final      CBC Recent Labs  Lab 06/04/19 2005 06/05/19 0421 06/06/19 0420  WBC 9.7 11.0* 9.4  HGB 7.6* 7.5* 6.5*  HCT 23.1* 23.2* 20.3*  PLT 387 362 310  MCV 93.9 95.5 94.9  MCH 30.9 30.9 30.4  MCHC 32.9 32.3 32.0  RDW 18.6* 18.9* 19.0*  LYMPHSABS 1.2  --   --   MONOABS 0.5  --   --   EOSABS 0.0  --   --   BASOSABS 0.0  --   --     Chemistries  Recent Labs  Lab 06/04/19 2005 06/05/19 0421 06/05/19 1540 06/06/19 0420  NA 139 141 143 145  K 2.4* 2.7* 2.8* 2.7*  CL 114* 116* 120* 120*  CO2 15* 15* 16* 18*  GLUCOSE 124* 123* 127* 108*  BUN 29* 26* 24* 22  CREATININE 1.38* 1.28* 1.16 0.99  CALCIUM 6.4* 6.5* 6.5* 6.9*  MG  --  1.3*  --  1.9  AST 43*  47*  --  33  ALT 32 32  --  27  ALKPHOS 115 114  --  89  BILITOT 1.0 1.0  --  0.7   ------------------------------------------------------------------------------------------------------------------ No results for input(s): CHOL,  HDL, LDLCALC, TRIG, CHOLHDL, LDLDIRECT in the last 72 hours.  Lab Results  Component Value Date   HGBA1C 6.9 (H) 05/13/2018   ------------------------------------------------------------------------------------------------------------------ No results for input(s): TSH, T4TOTAL, T3FREE, THYROIDAB in the last 72 hours.  Invalid input(s): FREET3 ------------------------------------------------------------------------------------------------------------------ Recent Labs    06/05/19 0420 06/05/19 0421  VITAMINB12 455  --   FOLATE  --  6.9  FERRITIN 379*  --   TIBC 127*  --   IRON 17*  --   RETICCTPCT 1.9  --     Coagulation profile Recent Labs  Lab 06/04/19 2005  INR 1.2    No results for input(s): DDIMER in the last 72 hours.  Cardiac Enzymes No results for input(s): CKMB, TROPONINI, MYOGLOBIN in the last 168 hours.  Invalid input(s): CK ------------------------------------------------------------------------------------------------------------------    Component Value Date/Time   BNP 238.0 (H) 06/05/2019 1448     Roxan Hockey M.D on 06/06/2019 at 6:24 PM  Go to [email or web address] - for contact info  [facility] - Office  [phone]

## 2019-06-07 ENCOUNTER — Encounter (HOSPITAL_COMMUNITY): Payer: Self-pay | Admitting: Internal Medicine

## 2019-06-07 DIAGNOSIS — D649 Anemia, unspecified: Secondary | ICD-10-CM

## 2019-06-07 DIAGNOSIS — N12 Tubulo-interstitial nephritis, not specified as acute or chronic: Secondary | ICD-10-CM

## 2019-06-07 DIAGNOSIS — F1010 Alcohol abuse, uncomplicated: Secondary | ICD-10-CM

## 2019-06-07 LAB — CBC
HCT: 23.5 % — ABNORMAL LOW (ref 39.0–52.0)
Hemoglobin: 7.6 g/dL — ABNORMAL LOW (ref 13.0–17.0)
MCH: 30 pg (ref 26.0–34.0)
MCHC: 32.3 g/dL (ref 30.0–36.0)
MCV: 92.9 fL (ref 80.0–100.0)
Platelets: 317 10*3/uL (ref 150–400)
RBC: 2.53 MIL/uL — ABNORMAL LOW (ref 4.22–5.81)
RDW: 18.3 % — ABNORMAL HIGH (ref 11.5–15.5)
WBC: 8.4 10*3/uL (ref 4.0–10.5)
nRBC: 0 % (ref 0.0–0.2)

## 2019-06-07 LAB — BPAM RBC
Blood Product Expiration Date: 202105092359
ISSUE DATE / TIME: 202104192040
Unit Type and Rh: 1700

## 2019-06-07 LAB — URINE CULTURE: Culture: NO GROWTH

## 2019-06-07 LAB — TYPE AND SCREEN
ABO/RH(D): B POS
Antibody Screen: NEGATIVE
Unit division: 0

## 2019-06-07 LAB — BASIC METABOLIC PANEL
Anion gap: 10 (ref 5–15)
BUN: 17 mg/dL (ref 8–23)
CO2: 19 mmol/L — ABNORMAL LOW (ref 22–32)
Calcium: 7.1 mg/dL — ABNORMAL LOW (ref 8.9–10.3)
Chloride: 119 mmol/L — ABNORMAL HIGH (ref 98–111)
Creatinine, Ser: 1.07 mg/dL (ref 0.61–1.24)
GFR calc Af Amer: >60 mL/min (ref >60)
GFR calc non Af Amer: >60 mL/min (ref >60)
Glucose, Bld: 114 mg/dL — ABNORMAL HIGH (ref 70–99)
Potassium: 3.1 mmol/L — ABNORMAL LOW (ref 3.5–5.1)
Sodium: 148 mmol/L — ABNORMAL HIGH (ref 135–145)

## 2019-06-07 LAB — AMMONIA: Ammonia: 45 umol/L — ABNORMAL HIGH (ref 9–35)

## 2019-06-07 MED ORDER — TRAMADOL HCL 50 MG PO TABS
50.0000 mg | ORAL_TABLET | Freq: Two times a day (BID) | ORAL | Status: DC | PRN
  Administered 2019-06-14: 50 mg via ORAL
  Filled 2019-06-07: qty 1

## 2019-06-07 MED ORDER — HYDROCORTISONE ACETATE 25 MG RE SUPP
25.0000 mg | Freq: Once | RECTAL | Status: AC
  Administered 2019-06-07: 25 mg via RECTAL
  Filled 2019-06-07: qty 1

## 2019-06-07 MED ORDER — POTASSIUM CHLORIDE CRYS ER 20 MEQ PO TBCR
40.0000 meq | EXTENDED_RELEASE_TABLET | ORAL | Status: AC
  Administered 2019-06-07 (×2): 40 meq via ORAL
  Filled 2019-06-07 (×2): qty 2

## 2019-06-07 MED ORDER — PANTOPRAZOLE SODIUM 40 MG PO TBEC
40.0000 mg | DELAYED_RELEASE_TABLET | Freq: Two times a day (BID) | ORAL | Status: DC
  Administered 2019-06-07 – 2019-06-13 (×12): 40 mg via ORAL
  Filled 2019-06-07 (×12): qty 1

## 2019-06-07 MED ORDER — HYDROCORT-PRAMOXINE (PERIANAL) 1-1 % EX FOAM
1.0000 | Freq: Two times a day (BID) | CUTANEOUS | Status: DC
  Administered 2019-06-08 – 2019-06-12 (×7): 1 via RECTAL
  Filled 2019-06-07: qty 10

## 2019-06-07 MED ORDER — RIFAXIMIN 550 MG PO TABS
550.0000 mg | ORAL_TABLET | Freq: Two times a day (BID) | ORAL | Status: DC
  Administered 2019-06-07 – 2019-06-13 (×12): 550 mg via ORAL
  Filled 2019-06-07 (×13): qty 1

## 2019-06-07 MED ORDER — HYDROCORTISONE (PERIANAL) 2.5 % EX CREA
TOPICAL_CREAM | Freq: Four times a day (QID) | CUTANEOUS | Status: DC
  Administered 2019-06-15: 1 via RECTAL
  Filled 2019-06-07 (×2): qty 28.35

## 2019-06-07 MED ORDER — HEPARIN SODIUM (PORCINE) 5000 UNIT/ML IJ SOLN: 5000.0000 [IU] | Freq: Three times a day (TID) | INTRAMUSCULAR | Status: DC

## 2019-06-07 NOTE — Consult Note (Signed)
Referring Provider: Dr. Roxan Hockey Primary Care Physician:  [name], MD Primary Gastroenterologist:  Dr. Gala Romney   Date of Admission: 06/04/2019 Date of Consultation: 06/07/2019  Reason for Consultation:  Anemia and heme positive stool   HPI:  Darren Howard is a 69 y.o. year old male with history of chronic anemia, prior transfusions during hospitalization both in March 2020 and April 2020, now with acute on chronic anemia and heme positive stool. Prior GI evaluation while previously hospitalized includes EGD March 2020  with erosive gastropathy and duodenal erosions, felt to be NSAID effect. Colonoscopy April 2020 during subsequent hospitalization with inadequate colon prep, two 4-6 mm tubular adenomas and one large 18 mm polyp at IC valve (tubular adenoma) s/p piecemeal removal. He did not return for early interval surveillance colonoscopy, recommended at 6 months. He presented to the ED with reports of diarrhea, admitted with possible right-sided pyelonephritis and findings of possible stercoral colitis on CT abd/pelvis with contrast. Multiple electrolyte abnormalities. Admitting Hgb 7.6, similar to one year prior. Drop in Hgb to 6.5 this admission without overt GI bleeding, s/p 1 unit PRBCs with improvement to 7.6 near his baseline. Historically 7/8 range. GI consulted due to acute on chronic anemia with heme positive stool.   He has developed symptoms of alcohol withdrawal during this admission, with intermittent confusion. He is not a valid historian at time of consultation. Unable to obtain HPI from patient. However, he is no distress and tries to answer questions but without appropriate answers. Believes the year is 1700. He knows he is in [place] but unsure what facility or that he is in a hospital. He is able to follow simple commands such as squeezing my hands. Denies any abdominal pain. Nursing staff state he is tolerating a heart healthy diet at this point. No vomiting. No  overt signs of GI bleeding. While in room, he had a soft, runny BM that was brown.   Nursing staff state he had received lactulose one dose yesterday evening, with multiple soft/runny stools since that time. No watery stool.    Past Medical History:  Diagnosis Date  . Alcohol abuse   . Anemia   . Diabetes mellitus without complication ([facility])   . Duodenal erosion   . Erosive gastropathy   . GI bleed 04/2018  . Glucose intolerance (impaired glucose tolerance)   . Hyperlipemia   . Hypertension   . Noncompliance     Past Surgical History:  Procedure Laterality Date  . BIOPSY  05/17/2018   Procedure: BIOPSY;  Surgeon: Daneil Dolin, MD;  Location: [facility];  Service: Endoscopy;;  . CARDIAC CATHETERIZATION    . COLONOSCOPY WITH PROPOFOL N/A 06/01/2018   inadequate colon prep, hemorrhoids on perianal exam, two 4-6 mm polyps at hepatic flexure, one 18 mm polyp at IC valve s/p piecemeal removal (tubular adenomas). was to have surveillance colonoscopy in 6 months but did not return to the office  . ESOPHAGOGASTRODUODENOSCOPY (EGD) WITH PROPOFOL N/A 05/17/2018   Dr. Gala Romney: normal esophagus, erosive gastropathy s/p biopsy, duodenal erosions, felt to be NSAID effect  . HERNIA REPAIR    . POLYPECTOMY  06/01/2018   Procedure: POLYPECTOMY;  Surgeon: Daneil Dolin, MD;  Location: [facility];  Service: Endoscopy;;  colon    Prior to Admission medications   Medication Sig Start Date End Date Taking? Authorizing Provider  magnesium oxide (MAG-OX) 400 (241.3 Mg) MG tablet Take 1 tablet (400 mg total) by mouth 2 (two) times daily.  06/01/18  Yes Tat, Shanon Brow, MD  amLODipine (NORVASC) 2.5 MG tablet Take 4 tablets (10 mg total) by mouth daily. 05/30/18   Orson Eva, MD  atorvastatin (LIPITOR) 10 MG tablet Take 10 mg by mouth at bedtime.    [provider]  calcium carbonate (TUMS - DOSED IN MG ELEMENTAL CALCIUM) 500 MG chewable tablet Chew 2 tablets (400 mg of elemental calcium total) by  mouth 2 (two) times daily with a meal. 05/30/18   Tat, Shanon Brow, MD  carvedilol (COREG) 25 MG tablet Take 25 mg by mouth 2 (two) times daily.    [provider]  ferrous sulfate 325 (65 FE) MG tablet Take 1 tablet (325 mg total) by mouth daily with breakfast. Patient not taking: Reported on 06/05/2019 05/31/18   Orson Eva, MD  gabapentin (NEURONTIN) 300 MG capsule Take 300 mg by mouth 3 (three) times daily. 05/20/19   [provider]  hydrocortisone (ANUSOL-HC) 2.5 % rectal cream Place rectally 3 (three) times daily. Apply to anorectum every 6 hours x 10 days Patient not taking: Reported on 06/05/2019 06/01/18   Orson Eva, MD  metFORMIN (GLUCOPHAGE-XR) 500 MG 24 hr tablet Take 500 mg by mouth 2 (two) times daily.     [provider]  pantoprazole (PROTONIX) 40 MG tablet Take 1 tablet (40 mg total) by mouth daily for 30 days. 05/19/18 06/18/18  Manuella Ghazi, Pratik D, DO  polyethylene glycol (MIRALAX / GLYCOLAX) 17 g packet Take 17 g by mouth daily.    [provider]  polyethylene glycol (MIRALAX / GLYCOLAX) 17 g packet Take 17 g by mouth daily. Patient not taking: Reported on 06/05/2019 06/01/18   Orson Eva, MD  senna (SENOKOT) 8.6 MG TABS tablet Take 1 tablet by mouth 2 (two) times daily.    [provider]  senna (SENOKOT) 8.6 MG TABS tablet Take 2 tablets (17.2 mg total) by mouth daily. Patient not taking: Reported on 06/05/2019 06/01/18   Orson Eva, MD  Vitamin D, Ergocalciferol, (DRISDOL) 1.25 MG (50000 UT) CAPS capsule Take 1 capsule (50,000 Units total) by mouth every 7 (seven) days. Next dose 06/05/18 Patient not taking: Reported on 06/05/2019 06/05/18   Orson Eva, MD    Current Facility-Administered Medications  Medication Dose Route Frequency Provider Last Rate Last Admin  . amoxicillin-clavulanate (AUGMENTIN) 875-125 MG per tablet 1 tablet  1 tablet Oral Q12H Roxan Hockey, MD   1 tablet at 06/07/19 0929  . carvedilol (COREG) tablet 12.5 mg  12.5 mg Oral BID  Reubin Milan, MD   12.5 mg at 06/07/19 0102  . Chlorhexidine Gluconate Cloth 2 % PADS 6 each  6 each Topical Daily Kathie Dike, MD   6 each at 06/06/19 0818  . dextrose 5 % 1,000 mL with potassium chloride 20 mEq, sodium bicarbonate 50 mEq infusion   Intravenous Continuous Denton Brick, Courage, MD 75 mL/hr at 06/07/19 1444 Rate Verify at 06/07/19 1444  . folic acid (FOLVITE) tablet 1 mg  1 mg Oral Daily Reubin Milan, MD   1 mg at 06/07/19 0929  . hydrocortisone (ANUSOL-HC) suppository 25 mg  25 mg Rectal BID Roxan Hockey, MD   25 mg at 06/07/19 0930  . LORazepam (ATIVAN) tablet 1-4 mg  1-4 mg Oral Q1H PRN Reubin Milan, MD       Or  . LORazepam (ATIVAN) injection 1-4 mg  1-4 mg Intravenous Q1H PRN Reubin Milan, MD   2 mg at 06/07/19 0416  . multivitamin with minerals  tablet 1 tablet  1 tablet Oral Daily Reubin Milan, MD   1 tablet at 06/07/19 [phone]  . pantoprazole (PROTONIX) EC tablet 40 mg  40 mg Oral BID AC Emokpae, Courage, MD      . polyethylene glycol (MIRALAX / GLYCOLAX) packet 17 g  17 g Oral Daily PRN Reubin Milan, MD      . tamsulosin [facility]) capsule 0.4 mg  0.4 mg Oral QPC supper Kathie Dike, MD   0.4 mg at 06/06/19 1742  . thiamine tablet 100 mg  100 mg Oral Daily Reubin Milan, MD   100 mg at 06/07/19 8676   Or  . thiamine (B-1) injection 100 mg  100 mg Intravenous Daily Reubin Milan, MD        Allergies as of 06/04/2019  . (No Known Allergies)    Family History  Problem Relation Age of Onset  . Congestive Heart Failure Mother   . Congestive Heart Failure Father   . Cancer Brother        unknown kind   . Colon cancer Neg Hx   . Colon polyps Neg Hx     Social History   Socioeconomic History  . Marital status: Widowed    Spouse name: Not on file  . Number of children: Not on file  . Years of education: Not on file  . Highest education level: Not on file  Occupational History  . Occupation: retired     Comment: [employer]  Tobacco Use  . Smoking status: Current Every Day Smoker    Packs/day: 0.50    Years: 30.00    Pack years: 15.00    Types: Cigarettes  . Smokeless tobacco: Never Used  Substance and Sexual Activity  . Alcohol use: Yes    Alcohol/week: 2.0 standard drinks    Types: 2 Shots of liquor per week  . Drug use: No  . Sexual activity: Not Currently  Other Topics Concern  . Not on file  Social History Narrative  . Not on file   Social Determinants of Health   Financial Resource Strain:   . Difficulty of Paying Living Expenses:   Food Insecurity:   . Worried About Charity fundraiser in the Last Year:   . Arboriculturist in the Last Year:   Transportation Needs:   . Film/video editor (Medical):   Marland Kitchen Lack of Transportation (Non-Medical):   Physical Activity:   . Days of Exercise per Week:   . Minutes of Exercise per Session:   Stress:   . Feeling of Stress :   Social Connections:   . Frequency of Communication with Friends and Family:   . Frequency of Social Gatherings with Friends and Family:   . Attends Religious Services:   . Active Member of Clubs or Organizations:   . Attends Archivist Meetings:   Marland Kitchen Marital Status:   Intimate Partner Violence:   . Fear of Current or Ex-Partner:   . Emotionally Abused:   Marland Kitchen Physically Abused:   . Sexually Abused:     Review of Systems: Unable to obtain due to cognitive status.   Physical Exam: Vital signs in last 24 hours: Temp:  [98.2 F (36.8 C)-99.4 F (37.4 C)] 99.4 F (37.4 C) (04/20 1116) Pulse Rate:  [73-90] 75 (04/20 1300) Resp:  [13-33] 16 (04/20 1300) BP: (100-179)/(63-162) 155/75 (04/20 1300) SpO2:  [100 %] 100 % (04/20 1300) Weight:  [98.3 kg] 98.3 kg (04/20 0455) Last  BM Date: 06/07/19 General:   Resting in bed with eyes closed, awakens to verbal stimuli, attempts to answer questions but not appropriately. Oriented to self only. Drowsy.  Head:  Normocephalic and atraumatic. Eyes:   Sclera clear, no icterus.    Ears:  Normal auditory acuity. Nose:  No deformity, discharge,  or lesions. Lungs:  Clear throughout to auscultation.    Heart:  S1 S2 present without murmur  Abdomen:  Round, distended, no TTP, somewhat tympanic bowel sounds Rectal:  External hemorrhoids and prolapsing Grade 3 hemorrhoids, unable to complete DRE to assess for disimpaction due to discomfort.  Msk:  Symmetrical without gross deformities.  Extremities:  With pedal edema  Neurologic:  Drowsy but awakens to verbal stimuli. Oriented to person only.  Psych:  Alert and cooperative. Normal mood and affect.  Intake/Output from previous day: 04/19 0701 - 04/20 0700 In: 2896.9 [P.O.:720; I.V.:1688.5; Blood:388; IV Piggyback:100.4] Out: 1300 [Urine:1300] Intake/Output this shift: Total I/O In: 654.9 [I.V.:654.9] Out: -   Lab Results: Recent Labs    06/05/19 0421 06/06/19 0420 06/07/19 0425  WBC 11.0* 9.4 8.4  HGB 7.5* 6.5* 7.6*  HCT 23.2* 20.3* 23.5*  PLT 362 310 317   BMET Recent Labs    06/05/19 1540 06/06/19 0420 06/07/19 0425  NA 143 145 148*  K 2.8* 2.7* 3.1*  CL 120* 120* 119*  CO2 16* 18* 19*  GLUCOSE 127* 108* 114*  BUN 24* 22 17  CREATININE 1.16 0.99 1.07  CALCIUM 6.5* 6.9* 7.1*   LFT Recent Labs    06/04/19 2005 06/05/19 0421 06/06/19 0420  PROT 6.0* 6.1* 5.1*  ALBUMIN 2.6* 2.6* 2.2*  AST 43* 47* 33  ALT 32 32 27  ALKPHOS 115 114 89  BILITOT 1.0 1.0 0.7   PT/INR Recent Labs    06/04/19 2005  LABPROT 15.3*  INR 1.2      Impression: 69 year old male with history of alcohol abuse, chronic anemia with blood transfusions on several prior occasions during hospitalization last year, admitted with right-sided pyelonephritis and findings of possible stercoral colitis on CT abd/pelvis with contrast. Multiple electrolyte abnormalities. Acute drop in Hgb from baseline to 6.5, receiving 1 unit PRBCs with improvement to 7.6. historically 7/8 range. No overt GI  bleeding but has documented heme positive stool. Now with alcohol withdrawal and pleasantly confused. Difficult historian, with majority of information obtained from chart and nursing staff.   Acute on chronic anemia: stool brown today. EGD recent from last year with erosive gastropathy, duodenal erosions, likely NSAID effect. Colonoscopy with inadequate prep April 2020 and large polyp piecemeal removed, overdue for early interval surveillance. Would benefit from colonoscopy with adequate prep prior to discharge if possible with history of known polyps and inadequate visualization last year. Doubt occult UGI source in this setting.   Stercoral colitis: unable to complete DRE due to prolapsing Grade 3 hemorrhoids with discomfort. However, he has had multiple soft stools with consistency and doubt dealing with overflow at this time. Would benefit from dedicated bowel regimen. Reassess tomorrow, as he has had multiple stools today following lactulose yesterday evening.   Abdominal distension: no N/V, no tenderness on exam. CT on admission. Check KUB if worsening, unable to rule out evolving ileus. Unknown baseline.   Plan:  Serial H/H  Continue with Anusol per rectum BID  Will need dedicated bowel regimen going forward: reassess tomorrow and consider Linzess or Amitiza  Would benefit from colonoscopy +/- EGD prior to discharge once clinically improved  Protonix BID  ETOH cessation  Will reassess tomorrow   Annitta Needs, PhD, ANP-BC [facility]        LOS: 2 days    06/07/2019, 2:55 PM

## 2019-06-07 NOTE — Progress Notes (Signed)
Patient Demographics:    Markos Theil, is a 69 y.o. male, DOB - [birth year], WYO:[id number]  Admit date - 06/04/2019   Admitting Physician Kathie Dike, MD  Outpatient Primary MD for the patient is [name], MD  LOS - 2   Chief Complaint  Patient presents with  . Leg Swelling        Subjective:    [name] today has no fevers, no emesis,  No chest pain,  --Was confused, agitated last night requiring as needed lorazepam and some mittens/soft restraints -Had multiple BMs with lactulose--no obvious blood  Assessment  & Plan :    Principal Problem:   Pyelonephritis of right kidney Active Problems:   Hypertension   Normocytic anemia   DM2 (diabetes mellitus, type 2) (HCC)   Hypokalemia   Hypocalcemia   Alcohol abuse   Tobacco use   Pyelonephritis  Brief Summary:- 69 y.o. male with medical history significant of alcohol abuse, chronic normocytic anemia, medication and treatment noncompliance, history of duodenal reduction, erosive gastropathy, GI bleed, glucose intolerance/type 2 diabetes, hyperlipidemia, hypertension admitted on 06/05/2019 with possible right-sided pyelonephritis, as well as stercoral colitis and found to have alcohol withdrawal symptoms  A/p 1)Stercoral Colitis--- patient previously received vancomycin, Rocephin and cefepime as well as Flagyl ----we will avoid Flagyl in this alcoholic male,  --continue Augmentin -Lactulose as ordered, avoid constipation  2) alcohol abuse with DTs--- patient with episodes of confusion, disorientation and restlessness consistent with alcohol withdrawal -----continues to require as needed IV lorazepam continue folic acid, thiamine/multivitamin and lorazepam per CIWA protocol  3)DM2--last A1c 6.9 reflecting excellent DM control PTA -Metformin on hold Use Novolog/Humalog Sliding scale insulin with Accu-Cheks/Fingersticks as ordered     4) hypocalcemia/hypokalemia/hypomagnesemia and hypoalbuminemia----partly due to poor nutritional intake in an alcoholic male----replace and recheck -Continue IV fluids   5)HTN--stable, continue Coreg at reduced dose of 12.5 mg twice daily, hold amlodipine  6) suspected right-sided pyelonephritis--- urine and blood cultures negative, antibiotics as above #1 for stercoral colitis rather than pyelonephritis  7) acute on chronic symptomatic anemia----Hgb up to 7.6 from 6.5 after 1 unit of blood was transfused on 06/06/2019, -no significant tachycardia at this time most likely due to Coreg use --Stool occult blood is positive -We will get GI input  Disposition/Need for in-Hospital Stay- patient unable to be discharged at this time due to ---alcohol withdrawal symptoms requiring IV lorazepam and IV hydration pending improvement in mental status to allow for adequate oral intake -Patient From: home  D/C Place: home  Barriers: Not Clinically Stable- --- DTs persist --  Code Status : full  Family Communication:   -Discussed with son [name] who is visiting  Consults  : GI input requested  DVT Prophylaxis  :   - SCDs   Lab Results  Component Value Date   PLT 317 06/07/2019    Inpatient Medications  Scheduled Meds: . amoxicillin-clavulanate  1 tablet Oral Q12H  . carvedilol  12.5 mg Oral BID  . Chlorhexidine Gluconate Cloth  6 each Topical Daily  . folic acid  1 mg Oral Daily  . hydrocortisone  25 mg Rectal BID  . multivitamin with minerals  1 tablet Oral Daily  . pantoprazole  40 mg Oral BID AC  .  tamsulosin  0.4 mg Oral QPC supper  . thiamine  100 mg Oral Daily   Or  . thiamine  100 mg Intravenous Daily   Continuous Infusions: . dextrose 5 % 1,000 mL with potassium chloride 20 mEq, sodium bicarbonate 50 mEq infusion 75 mL/hr at 06/07/19 1224   PRN Meds:.LORazepam **OR** LORazepam, polyethylene glycol    Anti-infectives (From admission, onward)   Start     Dose/Rate Route  Frequency Ordered Stop   06/06/19 2200  amoxicillin-clavulanate (AUGMENTIN) 875-125 MG per tablet 1 tablet     1 tablet Oral Every 12 hours 06/06/19 1822     06/05/19 0645  metroNIDAZOLE (FLAGYL) IVPB 500 mg     500 mg 100 mL/hr over 60 Minutes Intravenous  Once 06/05/19 0640 06/05/19 0954   06/05/19 0600  ceFEPIme (MAXIPIME) 2 g in sodium chloride 0.9 % 100 mL IVPB  Status:  Discontinued     2 g 200 mL/hr over 30 Minutes Intravenous Every 8 hours 06/05/19 0217 06/05/19 0357   06/05/19 0400  cefTRIAXone (ROCEPHIN) 1 g in sodium chloride 0.9 % 100 mL IVPB  Status:  Discontinued     1 g 200 mL/hr over 30 Minutes Intravenous Every 24 hours 06/05/19 0357 06/06/19 1822   06/04/19 2130  vancomycin (VANCOREADY) IVPB 2000 mg/400 mL     2,000 mg 200 mL/hr over 120 Minutes Intravenous  Once 06/04/19 2130 06/05/19 0216   06/04/19 2115  ceFEPIme (MAXIPIME) 2 g in sodium chloride 0.9 % 100 mL IVPB     2 g 200 mL/hr over 30 Minutes Intravenous  Once 06/04/19 2105 06/04/19 2226   06/04/19 2115  metroNIDAZOLE (FLAGYL) IVPB 500 mg     500 mg 100 mL/hr over 60 Minutes Intravenous  Once 06/04/19 2105 06/05/19 0028   06/04/19 2115  vancomycin (VANCOCIN) IVPB 1000 mg/200 mL premix  Status:  Discontinued     1,000 mg 200 mL/hr over 60 Minutes Intravenous  Once 06/04/19 2105 06/04/19 2130        Objective:   Vitals:   06/07/19 0707 06/07/19 0800 06/07/19 1116 06/07/19 1224  BP:  140/80  123/87  Pulse: 80 79 78 77  Resp: (!) 22   20  Temp: 98.2 F (36.8 C)  99.4 F (37.4 C)   TempSrc: Oral  Oral   SpO2: 100% 100% 100% 100%  Weight:      Height:        Wt Readings from Last 3 Encounters:  06/07/19 98.3 kg  05/31/18 100 kg  05/17/18 93.5 kg     Intake/Output Summary (Last 24 hours) at 06/07/2019 1235 Last data filed at 06/07/2019 1224 Gross per 24 hour  Intake 3000.92 ml  Output 1300 ml  Net 1700.92 ml     Physical Exam  Gen:-Intermittently confused and disoriented  HEENT:- [place].AT,  No sclera icterus Neck-Supple Neck,No JVD,.  Lungs-  CTAB , fair symmetrical air movement CV- S1, S2 normal, regular ,  Abd-  +ve B.Sounds, Abd Soft, No tenderness, no CVA area tenderness Extremity/Skin:- No  edema, pedal pulses present  Psych-confused, disoriented neuro-generalized weakness, no new focal deficits, patient does have tremors consistent with alcohol withdrawal   Data Review:   Micro Results Recent Results (from the past 240 hour(s))  Culture, blood (routine x 2)     Status: None (Preliminary result)   Collection Time: 06/04/19  8:05 PM   Specimen: Left Antecubital; Blood  Result Value Ref Range Status   Specimen Description LEFT ANTECUBITAL  Final   Special Requests   Final    BOTTLES DRAWN AEROBIC AND ANAEROBIC Blood Culture adequate volume   Culture   Final    NO GROWTH 3 DAYS Performed at [facility], [address], [place], [place] [address]    Report Status PENDING  Incomplete  Urine culture     Status: None   Collection Time: 06/04/19  8:12 PM   Specimen: Urine, Catheterized  Result Value Ref Range Status   Specimen Description   Final    URINE, CATHETERIZED Performed at [facility], [address], [place], [place] [address]    Special Requests   Final    NONE Performed at [facility], [address], [place], [place] [address]    Culture   Final    NO GROWTH Performed at [facility], [place] [address] [place], [place] [address]    Report Status 06/07/2019 FINAL  Final  Culture, blood (routine x 2)     Status: None (Preliminary result)   Collection Time: 06/04/19  8:49 PM   Specimen: Right Antecubital; Blood  Result Value Ref Range Status   Specimen Description RIGHT ANTECUBITAL  Final   Special Requests   Final    BOTTLES DRAWN AEROBIC AND ANAEROBIC Blood Culture adequate volume   Culture   Final    NO GROWTH 3 DAYS Performed at [facility], [address] [place], [place] [address]    Report Status PENDING  Incomplete  MRSA PCR  Screening     Status: None   Collection Time: 06/05/19  6:46 PM   Specimen: Nasopharyngeal  Result Value Ref Range Status   MRSA by PCR NEGATIVE NEGATIVE Final    Comment:        The GeneXpert MRSA Assay (FDA approved for NASAL specimens only), is one component of a comprehensive MRSA colonization surveillance program. It is not intended to diagnose MRSA infection nor to guide or monitor treatment for MRSA infections. Performed at [facility], [address], [place], [place] [address]   Respiratory Panel by RT PCR (Flu A&B, Covid) - Nasopharyngeal Swab     Status: None   Collection Time: 06/06/19  2:51 AM   Specimen: Nasopharyngeal Swab  Result Value Ref Range Status   SARS Coronavirus 2 by RT PCR NEGATIVE NEGATIVE Final    Comment: (NOTE) SARS-CoV-2 target nucleic acids are NOT DETECTED. The SARS-CoV-2 RNA is generally detectable in upper respiratoy specimens during the acute phase of infection. The lowest concentration of SARS-CoV-2 viral copies this assay can detect is 131 copies/mL. A negative result does not preclude SARS-Cov-2 infection and should not be used as the sole basis for treatment or other patient management decisions. A negative result may occur with  improper specimen collection/handling, submission of specimen other than nasopharyngeal swab, presence of viral mutation(s) within the areas targeted by this assay, and inadequate number of viral copies (<131 copies/mL). A negative result must be combined with clinical observations, patient history, and epidemiological information. The expected result is Negative. Fact Sheet for Patients:  PinkCheek.be Fact Sheet for Healthcare Providers:  GravelBags.it This test is not yet ap proved or cleared by the [place] FDA and  has been authorized for detection and/or diagnosis of SARS-CoV-2 by FDA under an Emergency Use Authorization (EUA). This EUA will  remain  in effect (meaning this test can be used) for the duration of the COVID-19 declaration under Section 564(b)(1) of the Act, 21 U.S.C. section 360bbb-3(b)(1), unless the authorization is  terminated or revoked sooner.    Influenza A by PCR NEGATIVE NEGATIVE Final   Influenza B by PCR NEGATIVE NEGATIVE Final    Comment: (NOTE) The Xpert Xpress SARS-CoV-2/FLU/RSV assay is intended as an aid in  the diagnosis of influenza from Nasopharyngeal swab specimens and  should not be used as a sole basis for treatment. Nasal washings and  aspirates are unacceptable for Xpert Xpress SARS-CoV-2/FLU/RSV  testing. Fact Sheet for Patients: PinkCheek.be Fact Sheet for Healthcare Providers: GravelBags.it This test is not yet approved or cleared by the [place] FDA and  has been authorized for detection and/or diagnosis of SARS-CoV-2 by  FDA under an Emergency Use Authorization (EUA). This EUA will remain  in effect (meaning this test can be used) for the duration of the  Covid-19 declaration under Section 564(b)(1) of the Act, 21  U.S.C. section 360bbb-3(b)(1), unless the authorization is  terminated or revoked. Performed at [facility], [address] [place], [place] [address]     Radiology Reports CT Abdomen Pelvis W Contrast  Result Date: 06/04/2019 CLINICAL DATA:  69 year old male with abdominal distension and diarrhea. Concern for acute diverticulitis. EXAM: CT ABDOMEN AND PELVIS WITH CONTRAST TECHNIQUE: Multidetector CT imaging of the abdomen and pelvis was performed using the standard protocol following bolus administration of intravenous contrast. CONTRAST:  133mL OMNIPAQUE IOHEXOL 300 MG/ML  SOLN COMPARISON:  None. FINDINGS: Lower chest: The visualized lung bases are clear. No intra-abdominal free air or free fluid. Hepatobiliary: Severe fatty infiltration of the liver. No intrahepatic biliary ductal dilatation. The  gallbladder is unremarkable. Pancreas: Atrophic pancreas. No Accu to inflammatory changes. No dilatation of the main pancreatic duct. Spleen: Normal in size without focal abnormality. Adrenals/Urinary Tract: The adrenal glands are unremarkable. There is mild bilateral hydronephrosis, right greater left. There is enhancement of the right renal urothelium concerning for pyelonephritis. Correlation with urinalysis recommended. There is slight delayed enhancement of the right renal parenchyma. Multiple right renal cysts measure up to 4 cm in the interpolar aspect of the right kidney. Additional subcentimeter right renal hypodense lesions are too small to characterize. The urinary bladder is distended. Stomach/Bowel: There is narrowing of a segment of the rectosigmoid, likely related to mass effect caused by distended urinary bladder. There is mild thickened appearance of the segment of colon with surrounding stranding. The perisigmoid stranding may be related to UTI but concerning for stercoral colitis. Clinical correlation is recommended. There is probable mild associate luminal narrowing of the sigmoid colon. No evidence of small-bowel obstruction. The appendix is not visualized with certainty. No inflammatory changes identified in the right lower quadrant. Vascular/Lymphatic: The abdominal aorta and IVC unremarkable. No portal venous gas. There is no adenopathy. Reproductive: The prostate and seminal vesicles are grossly unremarkable. Partially visualized fluid within the left inguinal canal likely related to hydrocele. Other: Mild diffuse subcutaneous edema. Musculoskeletal: Degenerative changes of the spine. No acute osseous pathology. IMPRESSION: 1. Distended urinary bladder with mild bilateral hydronephrosis and findings of right-sided pyelonephritis. Correlation with urinalysis recommended. 2. Thickened appearance of the rectosigmoid with surrounding stranding which may be related to UTI but concerning for  stercoral colitis. Clinical correlation is recommended. 3. Severe fatty liver. Electronically Signed   By: Anner Crete M.D.   On: 06/04/2019 23:41   US Venous Img Lower Bilateral (DVT)  Result Date: 06/05/2019 CLINICAL DATA:  Bilateral lower extremity pain and edema for 1 month. EXAM: BILATERAL LOWER EXTREMITY VENOUS DOPPLER ULTRASOUND TECHNIQUE: Gray-scale sonography with compression, as well as color and  duplex ultrasound, were performed to evaluate the deep venous system(s) from the level of the common femoral vein through the popliteal and proximal calf veins. COMPARISON:  None. FINDINGS: VENOUS Normal compressibility of the common femoral, superficial femoral, and popliteal veins, as well as the visualized calf veins. Visualized portions of profunda femoral vein and great saphenous vein unremarkable. No filling defects to suggest DVT on grayscale or color Doppler imaging. Doppler waveforms show normal direction of venous flow, normal respiratory phasicity and response to augmentation. OTHER None. Limitations: none IMPRESSION: No femoropopliteal DVT nor evidence of DVT within the visualized calf veins. If clinical symptoms are inconsistent or if there are persistent or worsening symptoms, further imaging (possibly involving the iliac veins) may be warranted. Electronically Signed   By: Abigail Miyamoto M.D.   On: 06/05/2019 11:44   DG Chest Port 1 View  Result Date: 06/04/2019 CLINICAL DATA:  Tachycardia, bilateral lower extremity edema EXAM: PORTABLE CHEST 1 VIEW COMPARISON:  05/13/2018 FINDINGS: The heart size and mediastinal contours are within normal limits. Both lungs are clear. The visualized skeletal structures are unremarkable. IMPRESSION: No active disease. Electronically Signed   By: Randa Ngo M.D.   On: 06/04/2019 20:32   ECHOCARDIOGRAM COMPLETE  Result Date: 06/05/2019    ECHOCARDIOGRAM REPORT   Patient Name:   MENELIK MCFARREN Date of Exam: 06/05/2019 Medical Rec #:  [id number]      Height:       73.0 in Accession #:    [id number]    Weight:       205.0 lb Date of Birth:  1950-10-29    BSA:          2.174 m Patient Age:    65 years      BP:           152/86 mmHg Patient Gender: M             HR:           90 bpm. Exam Location:  [facility] Procedure: 2D Echo, Cardiac Doppler and Color Doppler Indications:    Lower extremity edema [218969]                 Alcohol abuse [[id number]]  History:        Patient has no prior history of Echocardiogram examinations.                 Risk Factors:Hypertension, Diabetes, Current Smoker and                 Dyslipidemia.  Sonographer:    Alvino Chapel RCS Referring Phys: [place]  1. Left ventricular ejection fraction, by estimation, is 60 to 65%. The left ventricle has normal function. The left ventricle has no regional wall motion abnormalities. There is moderate left ventricular hypertrophy. Left ventricular diastolic parameters were normal.  2. Right ventricular systolic function is normal. The right ventricular size is normal.  3. Left atrial size was mildly dilated.  4. The mitral valve is normal in structure. No evidence of mitral valve regurgitation.  5. The aortic valve is tricuspid. Aortic valve regurgitation is not visualized. No aortic stenosis is present. FINDINGS  Left Ventricle: Left ventricular ejection fraction, by estimation, is 60 to 65%. The left ventricle has normal function. The left ventricle has no regional wall motion abnormalities. The left ventricular internal cavity size was normal in size. There is  moderate left ventricular hypertrophy. Left ventricular diastolic parameters were normal. Right Ventricle: The  right ventricular size is normal. Right vetricular wall thickness was not assessed. Right ventricular systolic function is normal. There is mildly elevated pulmonary artery systolic pressure. The tricuspid regurgitant velocity is 2.55 m/s, and with an assumed right atrial pressure of 8 mmHg, the estimated  right ventricular systolic pressure is 62.9 mmHg. Left Atrium: Left atrial size was mildly dilated. Right Atrium: Right atrial size was normal in size. Pericardium: Trivial pericardial effusion is present. Presence of pericardial fat pad. Mitral Valve: The mitral valve is normal in structure. No evidence of mitral valve regurgitation. Tricuspid Valve: The tricuspid valve is normal in structure. Tricuspid valve regurgitation is trivial. Aortic Valve: The aortic valve is tricuspid. Aortic valve regurgitation is not visualized. No aortic stenosis is present. Pulmonic Valve: The pulmonic valve was not well visualized. Pulmonic valve regurgitation is not visualized. Aorta: The aortic root is normal in size and structure. IAS/Shunts: The interatrial septum was not well visualized.  LEFT VENTRICLE PLAX 2D LVIDd:         4.85 cm  Diastology LVIDs:         3.13 cm  LV e' lateral:   10.80 cm/s LV PW:         1.35 cm  LV E/e' lateral: 9.4 LV IVS:        1.41 cm  LV e' medial:    9.03 cm/s LVOT diam:     2.00 cm  LV E/e' medial:  11.2 LV SV:         76 LV SV Index:   35 LVOT Area:     3.14 cm  RIGHT VENTRICLE RV Mid diam:    3.04 cm RV S prime:     19.60 cm/s TAPSE (M-mode): 2.1 cm LEFT ATRIUM             Index LA diam:        4.10 cm 1.89 cm/m LA Vol (A2C):   95.3 ml 43.83 ml/m LA Vol (A4C):   66.2 ml 30.45 ml/m LA Biplane Vol: 80.8 ml 37.16 ml/m  AORTIC VALVE LVOT Vmax:   112.00 cm/s LVOT Vmean:  79.100 cm/s LVOT VTI:    0.241 m  AORTA Ao Root diam: 3.10 cm MITRAL VALVE                TRICUSPID VALVE MV Area (PHT): 3.07 cm     TR Peak grad:   26.0 mmHg MV Decel Time: 247 msec     TR Vmax:        255.00 cm/s MV E velocity: 101.00 cm/s MV A velocity: 95.10 cm/s   SHUNTS MV E/A ratio:  1.06         Systemic VTI:  0.24 m                             Systemic Diam: 2.00 cm Oswaldo Milian MD Electronically signed by Oswaldo Milian MD Signature Date/Time: 06/05/2019/10:46:22 PM    Final      CBC Recent Labs  Lab  06/04/19 2005 06/05/19 0421 06/06/19 0420 06/07/19 0425  WBC 9.7 11.0* 9.4 8.4  HGB 7.6* 7.5* 6.5* 7.6*  HCT 23.1* 23.2* 20.3* 23.5*  PLT 387 362 310 317  MCV 93.9 95.5 94.9 92.9  MCH 30.9 30.9 30.4 30.0  MCHC 32.9 32.3 32.0 32.3  RDW 18.6* 18.9* 19.0* 18.3*  LYMPHSABS 1.2  --   --   --   MONOABS 0.5  --   --   --  EOSABS 0.0  --   --   --   BASOSABS 0.0  --   --   --     Chemistries  Recent Labs  Lab 06/04/19 2005 06/05/19 0421 06/05/19 1540 06/06/19 0420 06/07/19 0425  NA 139 141 143 145 148*  K 2.4* 2.7* 2.8* 2.7* 3.1*  CL 114* 116* 120* 120* 119*  CO2 15* 15* 16* 18* 19*  GLUCOSE 124* 123* 127* 108* 114*  BUN 29* 26* 24* 22 17  CREATININE 1.38* 1.28* 1.16 0.99 1.07  CALCIUM 6.4* 6.5* 6.5* 6.9* 7.1*  MG  --  1.3*  --  1.9  --   AST 43* 47*  --  33  --   ALT 32 32  --  27  --   ALKPHOS 115 114  --  89  --   BILITOT 1.0 1.0  --  0.7  --    ------------------------------------------------------------------------------------------------------------------ No results for input(s): CHOL, HDL, LDLCALC, TRIG, CHOLHDL, LDLDIRECT in the last 72 hours.  Lab Results  Component Value Date   HGBA1C 6.9 (H) 05/13/2018   ------------------------------------------------------------------------------------------------------------------ No results for input(s): TSH, T4TOTAL, T3FREE, THYROIDAB in the last 72 hours.  Invalid input(s): FREET3 ------------------------------------------------------------------------------------------------------------------ Recent Labs    06/05/19 0420 06/05/19 0421  VITAMINB12 455  --   FOLATE  --  6.9  FERRITIN 379*  --   TIBC 127*  --   IRON 17*  --   RETICCTPCT 1.9  --     Coagulation profile Recent Labs  Lab 06/04/19 2005  INR 1.2    No results for input(s): DDIMER in the last 72 hours.  Cardiac Enzymes No results for input(s): CKMB, TROPONINI, MYOGLOBIN in the last 168 hours.  Invalid input(s):  CK ------------------------------------------------------------------------------------------------------------------    Component Value Date/Time   BNP 238.0 (H) 06/05/2019 3428     Roxan Hockey M.D on 06/07/2019 at 12:35 PM  Go to [email or web address] - for contact info  [facility] - Office  [phone]

## 2019-06-08 DIAGNOSIS — D649 Anemia, unspecified: Secondary | ICD-10-CM | POA: Diagnosis not present

## 2019-06-08 DIAGNOSIS — F1010 Alcohol abuse, uncomplicated: Secondary | ICD-10-CM | POA: Diagnosis not present

## 2019-06-08 DIAGNOSIS — R933 Abnormal findings on diagnostic imaging of other parts of digestive tract: Secondary | ICD-10-CM | POA: Diagnosis present

## 2019-06-08 LAB — CBC
HCT: 22.2 % — ABNORMAL LOW (ref 39.0–52.0)
Hemoglobin: 7.2 g/dL — ABNORMAL LOW (ref 13.0–17.0)
MCH: 30.4 pg (ref 26.0–34.0)
MCHC: 32.4 g/dL (ref 30.0–36.0)
MCV: 93.7 fL (ref 80.0–100.0)
Platelets: 287 10*3/uL (ref 150–400)
RBC: 2.37 MIL/uL — ABNORMAL LOW (ref 4.22–5.81)
RDW: 18.2 % — ABNORMAL HIGH (ref 11.5–15.5)
WBC: 7.3 10*3/uL (ref 4.0–10.5)
nRBC: 0 % (ref 0.0–0.2)

## 2019-06-08 LAB — COMPREHENSIVE METABOLIC PANEL
ALT: 23 U/L (ref 0–44)
AST: 37 U/L (ref 15–41)
Albumin: 2 g/dL — ABNORMAL LOW (ref 3.5–5.0)
Alkaline Phosphatase: 90 U/L (ref 38–126)
Anion gap: 7 (ref 5–15)
BUN: 14 mg/dL (ref 8–23)
CO2: 20 mmol/L — ABNORMAL LOW (ref 22–32)
Calcium: 7 mg/dL — ABNORMAL LOW (ref 8.9–10.3)
Chloride: 118 mmol/L — ABNORMAL HIGH (ref 98–111)
Creatinine, Ser: 0.96 mg/dL (ref 0.61–1.24)
GFR calc Af Amer: >60 mL/min (ref >60)
GFR calc non Af Amer: >60 mL/min (ref >60)
Glucose, Bld: 115 mg/dL — ABNORMAL HIGH (ref 70–99)
Potassium: 2.7 mmol/L — CL (ref 3.5–5.1)
Sodium: 145 mmol/L (ref 135–145)
Total Bilirubin: 0.5 mg/dL (ref 0.3–1.2)
Total Protein: 4.8 g/dL — ABNORMAL LOW (ref 6.5–8.1)

## 2019-06-08 LAB — MAGNESIUM: Magnesium: 1.7 mg/dL (ref 1.7–2.4)

## 2019-06-08 MED ORDER — MAGNESIUM SULFATE 2 GM/50ML IV SOLN
2.0000 g | Freq: Once | INTRAVENOUS | Status: AC
  Administered 2019-06-08: 2 g via INTRAVENOUS
  Filled 2019-06-08: qty 50

## 2019-06-08 MED ORDER — MAGNESIUM SULFATE 4 GM/100ML IV SOLN: 4.0000 g | Freq: Once | INTRAVENOUS | Status: DC

## 2019-06-08 MED ORDER — POTASSIUM CHLORIDE CRYS ER 20 MEQ PO TBCR
40.0000 meq | EXTENDED_RELEASE_TABLET | ORAL | Status: AC
  Administered 2019-06-08 (×2): 40 meq via ORAL
  Filled 2019-06-08 (×2): qty 2

## 2019-06-08 MED ORDER — POTASSIUM CHLORIDE CRYS ER 20 MEQ PO TBCR
40.0000 meq | EXTENDED_RELEASE_TABLET | Freq: Once | ORAL | Status: AC
  Administered 2019-06-08: 40 meq via ORAL
  Filled 2019-06-08: qty 2

## 2019-06-08 NOTE — Progress Notes (Signed)
Potassium 2.7 called by lab. Value reported to MD via text page.

## 2019-06-08 NOTE — Progress Notes (Signed)
Patient Demographics:    Darren Howard, is a 69 y.o. male, DOB - 1951-02-04, VVZ:[id number]  Admit date - 06/04/2019   Admitting Physician Kathie Dike, MD  Outpatient Primary MD for the patient is [name], MD  LOS - 3   Chief Complaint  Patient presents with  . Leg Swelling        Subjective:    Darren Howard today has no fevers, no emesis,  No chest pain,  Was restless and agitated overnight requiring IV lorazepam at least 3 times overnight -This morning more coherent more cooperative -Continues to have mushy stools without blood or mucus  Assessment  & Plan :    Principal Problem:   Pyelonephritis of right kidney Active Problems:   Hypertension   Normocytic anemia   DM2 (diabetes mellitus, type 2) (HCC)   Hypokalemia   Hypocalcemia   Alcohol abuse   Tobacco use   Pyelonephritis   Abnormal CT scan, colon  Brief Summary:- 69 y.o. male with medical history significant of alcohol abuse, chronic normocytic anemia, medication and treatment noncompliance, history of duodenal reduction, erosive gastropathy, GI bleed, glucose intolerance/type 2 diabetes, hyperlipidemia, hypertension admitted on 06/05/2019 with possible right-sided pyelonephritis, as well as stercoral colitis and found to have alcohol withdrawal symptoms  A/p 1)Stercoral Colitis--- patient previously received Vancomycin, Rocephin and Cefepime as well as Flagyl ----we will avoid Flagyl in this alcoholic male,  --continue Augmentin -Patient having mushy stools without blood or mucus at this time -GI input/consult appreciated -Continue Anusol AC for hemorrhoids  2)Alcohol Abuse with DTs--- patient with episodes of confusion, disorientation and restlessness consistent with alcohol withdrawal -----continues to require as needed IV lorazepam continue folic acid, thiamine/multivitamin and lorazepam per CIWA protocol -Slowly  improving, becoming more coherent  3)DM2--last A1c 6.9 reflecting excellent DM control PTA -Metformin on hold Use Novolog/Humalog Sliding scale insulin with Accu-Cheks/Fingersticks as ordered   4) hypocalcemia/hypokalemia/hypomagnesemia and hypoalbuminemia----partly due to poor nutritional intake in an alcoholic male----replace and recheck -Patient is less confused now, able to drink more - okay to stop IV fluids after current bag  5)HTN--stable, continue Coreg at reduced dose of 12.5 mg twice daily, hold amlodipine  6) suspected right-sided pyelonephritis--- urine and blood cultures negative, antibiotics as above #1 for stercoral colitis rather than pyelonephritis  7) acute on chronic symptomatic anemia----Hgb up to 7.2 from 6.5 after 1 unit of blood was transfused on 06/06/2019, -no significant tachycardia at this time most likely due to Coreg use --Stool occult blood is positive, no overt GI bleed at this time -GI consult appreciated recommends possible outpatient colonoscopy given poor prep with last colonoscopy and polyp removal at the time  Disposition/Need for in-Hospital Stay- patient unable to be discharged at this time due to ---alcohol withdrawal symptoms requiring IV lorazepam and IV hydration pending improvement in mental status to allow for adequate oral intake -Patient From: home  D/C Place: home  Barriers: Not Clinically Stable- --- DTs persist --  Code Status : full  Family Communication:   -Discussed with son [name] who is visiting  Consults  : GI   DVT Prophylaxis  :   - SCDs   Lab Results  Component Value Date   PLT 287 06/08/2019    Inpatient Medications  Scheduled Meds: . amoxicillin-clavulanate  1 tablet Oral Q12H  . carvedilol  12.5 mg Oral BID  . Chlorhexidine Gluconate Cloth  6 each Topical Daily  . folic acid  1 mg Oral Daily  . hydrocortisone   Rectal QID  . hydrocortisone-pramoxine  1 applicator Rectal BID  . multivitamin with minerals  1  tablet Oral Daily  . pantoprazole  40 mg Oral BID AC  . potassium chloride  40 mEq Oral Q3H  . rifaximin  550 mg Oral BID  . tamsulosin  0.4 mg Oral QPC supper  . thiamine  100 mg Oral Daily   Or  . thiamine  100 mg Intravenous Daily   Continuous Infusions:  PRN Meds:.polyethylene glycol, traMADol    Anti-infectives (From admission, onward)   Start     Dose/Rate Route Frequency Ordered Stop   06/07/19 2130  rifaximin (XIFAXAN) tablet 550 mg     550 mg Oral 2 times daily 06/07/19 2119     06/06/19 2200  amoxicillin-clavulanate (AUGMENTIN) 875-125 MG per tablet 1 tablet     1 tablet Oral Every 12 hours 06/06/19 1822     06/05/19 0645  metroNIDAZOLE (FLAGYL) IVPB 500 mg     500 mg 100 mL/hr over 60 Minutes Intravenous  Once 06/05/19 0640 06/05/19 0954   06/05/19 0600  ceFEPIme (MAXIPIME) 2 g in sodium chloride 0.9 % 100 mL IVPB  Status:  Discontinued     2 g 200 mL/hr over 30 Minutes Intravenous Every 8 hours 06/05/19 0217 06/05/19 0357   06/05/19 0400  cefTRIAXone (ROCEPHIN) 1 g in sodium chloride 0.9 % 100 mL IVPB  Status:  Discontinued     1 g 200 mL/hr over 30 Minutes Intravenous Every 24 hours 06/05/19 0357 06/06/19 1822   06/04/19 2130  vancomycin (VANCOREADY) IVPB 2000 mg/400 mL     2,000 mg 200 mL/hr over 120 Minutes Intravenous  Once 06/04/19 2130 06/05/19 0216   06/04/19 2115  ceFEPIme (MAXIPIME) 2 g in sodium chloride 0.9 % 100 mL IVPB     2 g 200 mL/hr over 30 Minutes Intravenous  Once 06/04/19 2105 06/04/19 2226   06/04/19 2115  metroNIDAZOLE (FLAGYL) IVPB 500 mg     500 mg 100 mL/hr over 60 Minutes Intravenous  Once 06/04/19 2105 06/05/19 0028   06/04/19 2115  vancomycin (VANCOCIN) IVPB 1000 mg/200 mL premix  Status:  Discontinued     1,000 mg 200 mL/hr over 60 Minutes Intravenous  Once 06/04/19 2105 06/04/19 2130        Objective:   Vitals:   06/08/19 0700 06/08/19 0800 06/08/19 0900 06/08/19 1121  BP: 139/65 114/62 126/63   Pulse: 68 70 82   Resp: 14  (!) 23 17   Temp: 98.1 F (36.7 C)   98.9 F (37.2 C)  TempSrc: Oral   Oral  SpO2: 93% 100% 100%   Weight:      Height:        Wt Readings from Last 3 Encounters:  06/08/19 98.7 kg  05/31/18 100 kg  05/17/18 93.5 kg     Intake/Output Summary (Last 24 hours) at 06/08/2019 1126 Last data filed at 06/08/2019 0900 Gross per 24 hour  Intake 2331.63 ml  Output 750 ml  Net 1581.63 ml     Physical Exam  Gen:-Less confused, less agitated  HEENT:- [place].AT, No sclera icterus Neck-Supple Neck,No JVD,.  Lungs-  CTAB , fair symmetrical air movement CV- S1, S2 normal, regular ,  Abd-  +ve  B.Sounds, Abd Soft, No tenderness, no CVA area tenderness Extremity/Skin:- No  edema, pedal pulses present  Psych-confused, disoriented neuro-generalized weakness, no new focal deficits, patient does have tremors consistent with alcohol withdrawal   Data Review:   Micro Results Recent Results (from the past 240 hour(s))  Culture, blood (routine x 2)     Status: None (Preliminary result)   Collection Time: 06/04/19  8:05 PM   Specimen: Left Antecubital; Blood  Result Value Ref Range Status   Specimen Description LEFT ANTECUBITAL  Final   Special Requests   Final    BOTTLES DRAWN AEROBIC AND ANAEROBIC Blood Culture adequate volume   Culture   Final    NO GROWTH 4 DAYS Performed at [facility], [address], [place], [place] [address]    Report Status PENDING  Incomplete  Urine culture     Status: None   Collection Time: 06/04/19  8:12 PM   Specimen: Urine, Catheterized  Result Value Ref Range Status   Specimen Description   Final    URINE, CATHETERIZED Performed at [facility], [address] [place], [place] [address]    Special Requests   Final    NONE Performed at [facility], [address], [place], [place] [address]    Culture   Final    NO GROWTH Performed at [facility], [place] [address] [place], [place] [address]    Report Status 06/07/2019 FINAL  Final  Culture,  blood (routine x 2)     Status: None (Preliminary result)   Collection Time: 06/04/19  8:49 PM   Specimen: Right Antecubital; Blood  Result Value Ref Range Status   Specimen Description RIGHT ANTECUBITAL  Final   Special Requests   Final    BOTTLES DRAWN AEROBIC AND ANAEROBIC Blood Culture adequate volume   Culture   Final    NO GROWTH 4 DAYS Performed at [facility], [address] [place], [place] [address]    Report Status PENDING  Incomplete  MRSA PCR Screening     Status: None   Collection Time: 06/05/19  6:46 PM   Specimen: Nasopharyngeal  Result Value Ref Range Status   MRSA by PCR NEGATIVE NEGATIVE Final    Comment:        The GeneXpert MRSA Assay (FDA approved for NASAL specimens only), is one component of a comprehensive MRSA colonization surveillance program. It is not intended to diagnose MRSA infection nor to guide or monitor treatment for MRSA infections. Performed at [facility], [address], [place],  [address]   Respiratory Panel by RT PCR (Flu A&B, Covid) - Nasopharyngeal Swab     Status: None   Collection Time: 06/06/19  2:51 AM   Specimen: Nasopharyngeal Swab  Result Value Ref Range Status   SARS Coronavirus 2 by RT PCR NEGATIVE NEGATIVE Final    Comment: (NOTE) SARS-CoV-2 target nucleic acids are NOT DETECTED. The SARS-CoV-2 RNA is generally detectable in upper respiratoy specimens during the acute phase of infection. The lowest concentration of SARS-CoV-2 viral copies this assay can detect is 131 copies/mL. A negative result does not preclude SARS-Cov-2 infection and should not be used as the sole basis for treatment or other patient management decisions. A negative result may occur with  improper specimen collection/handling, submission of specimen other than nasopharyngeal swab, presence of viral mutation(s) within the areas targeted by this assay, and inadequate number of viral copies (<131 copies/mL). A negative result must be combined  with clinical observations, patient  history, and epidemiological information. The expected result is Negative. Fact Sheet for Patients:  PinkCheek.be Fact Sheet for Healthcare Providers:  GravelBags.it This test is not yet ap proved or cleared by the [place] FDA and  has been authorized for detection and/or diagnosis of SARS-CoV-2 by FDA under an Emergency Use Authorization (EUA). This EUA will remain  in effect (meaning this test can be used) for the duration of the COVID-19 declaration under Section 564(b)(1) of the Act, 21 U.S.C. section 360bbb-3(b)(1), unless the authorization is terminated or revoked sooner.    Influenza A by PCR NEGATIVE NEGATIVE Final   Influenza B by PCR NEGATIVE NEGATIVE Final    Comment: (NOTE) The Xpert Xpress SARS-CoV-2/FLU/RSV assay is intended as an aid in  the diagnosis of influenza from Nasopharyngeal swab specimens and  should not be used as a sole basis for treatment. Nasal washings and  aspirates are unacceptable for Xpert Xpress SARS-CoV-2/FLU/RSV  testing. Fact Sheet for Patients: PinkCheek.be Fact Sheet for Healthcare Providers: GravelBags.it This test is not yet approved or cleared by the [place] FDA and  has been authorized for detection and/or diagnosis of SARS-CoV-2 by  FDA under an Emergency Use Authorization (EUA). This EUA will remain  in effect (meaning this test can be used) for the duration of the  Covid-19 declaration under Section 564(b)(1) of the Act, 21  U.S.C. section 360bbb-3(b)(1), unless the authorization is  terminated or revoked. Performed at [facility], [address], [place], [place] [address]     Radiology Reports CT Abdomen Pelvis W Contrast  Result Date: 06/04/2019 CLINICAL DATA:  69 year old male with abdominal distension and diarrhea. Concern for acute diverticulitis. EXAM: CT ABDOMEN  AND PELVIS WITH CONTRAST TECHNIQUE: Multidetector CT imaging of the abdomen and pelvis was performed using the standard protocol following bolus administration of intravenous contrast. CONTRAST:  133mL OMNIPAQUE IOHEXOL 300 MG/ML  SOLN COMPARISON:  None. FINDINGS: Lower chest: The visualized lung bases are clear. No intra-abdominal free air or free fluid. Hepatobiliary: Severe fatty infiltration of the liver. No intrahepatic biliary ductal dilatation. The gallbladder is unremarkable. Pancreas: Atrophic pancreas. No Accu to inflammatory changes. No dilatation of the main pancreatic duct. Spleen: Normal in size without focal abnormality. Adrenals/Urinary Tract: The adrenal glands are unremarkable. There is mild bilateral hydronephrosis, right greater left. There is enhancement of the right renal urothelium concerning for pyelonephritis. Correlation with urinalysis recommended. There is slight delayed enhancement of the right renal parenchyma. Multiple right renal cysts measure up to 4 cm in the interpolar aspect of the right kidney. Additional subcentimeter right renal hypodense lesions are too small to characterize. The urinary bladder is distended. Stomach/Bowel: There is narrowing of a segment of the rectosigmoid, likely related to mass effect caused by distended urinary bladder. There is mild thickened appearance of the segment of colon with surrounding stranding. The perisigmoid stranding may be related to UTI but concerning for stercoral colitis. Clinical correlation is recommended. There is probable mild associate luminal narrowing of the sigmoid colon. No evidence of small-bowel obstruction. The appendix is not visualized with certainty. No inflammatory changes identified in the right lower quadrant. Vascular/Lymphatic: The abdominal aorta and IVC unremarkable. No portal venous gas. There is no adenopathy. Reproductive: The prostate and seminal vesicles are grossly unremarkable. Partially visualized fluid  within the left inguinal canal likely related to hydrocele. Other: Mild diffuse subcutaneous edema. Musculoskeletal: Degenerative changes of the spine. No acute osseous pathology. IMPRESSION: 1. Distended urinary bladder with mild bilateral hydronephrosis and findings of  right-sided pyelonephritis. Correlation with urinalysis recommended. 2. Thickened appearance of the rectosigmoid with surrounding stranding which may be related to UTI but concerning for stercoral colitis. Clinical correlation is recommended. 3. Severe fatty liver. Electronically Signed   By: Anner Crete M.D.   On: 06/04/2019 23:41   US Venous Img Lower Bilateral (DVT)  Result Date: 06/05/2019 CLINICAL DATA:  Bilateral lower extremity pain and edema for 1 month. EXAM: BILATERAL LOWER EXTREMITY VENOUS DOPPLER ULTRASOUND TECHNIQUE: Gray-scale sonography with compression, as well as color and duplex ultrasound, were performed to evaluate the deep venous system(s) from the level of the common femoral vein through the popliteal and proximal calf veins. COMPARISON:  None. FINDINGS: VENOUS Normal compressibility of the common femoral, superficial femoral, and popliteal veins, as well as the visualized calf veins. Visualized portions of profunda femoral vein and great saphenous vein unremarkable. No filling defects to suggest DVT on grayscale or color Doppler imaging. Doppler waveforms show normal direction of venous flow, normal respiratory phasicity and response to augmentation. OTHER None. Limitations: none IMPRESSION: No femoropopliteal DVT nor evidence of DVT within the visualized calf veins. If clinical symptoms are inconsistent or if there are persistent or worsening symptoms, further imaging (possibly involving the iliac veins) may be warranted. Electronically Signed   By: Abigail Miyamoto M.D.   On: 06/05/2019 11:44   DG Chest Port 1 View  Result Date: 06/04/2019 CLINICAL DATA:  Tachycardia, bilateral lower extremity edema EXAM: PORTABLE  CHEST 1 VIEW COMPARISON:  05/13/2018 FINDINGS: The heart size and mediastinal contours are within normal limits. Both lungs are clear. The visualized skeletal structures are unremarkable. IMPRESSION: No active disease. Electronically Signed   By: Randa Ngo M.D.   On: 06/04/2019 20:32   ECHOCARDIOGRAM COMPLETE  Result Date: 06/05/2019    ECHOCARDIOGRAM REPORT   Patient Name:   GEORGES VICTORIO Date of Exam: 06/05/2019 Medical Rec #:  [id number]     Height:       73.0 in Accession #:    [id number]    Weight:       205.0 lb Date of Birth:  May 03, 1950    BSA:          2.174 m Patient Age:    4 years      BP:           152/86 mmHg Patient Gender: M             HR:           90 bpm. Exam Location:  [facility] Procedure: 2D Echo, Cardiac Doppler and Color Doppler Indications:    Lower extremity edema [218969]                 Alcohol abuse [614431]  History:        Patient has no prior history of Echocardiogram examinations.                 Risk Factors:Hypertension, Diabetes, Current Smoker and                 Dyslipidemia.  Sonographer:    Alvino Chapel RCS Referring Phys: Das[place]  1. Left ventricular ejection fraction, by estimation, is 60 to 65%. The left ventricle has normal function. The left ventricle has no regional wall motion abnormalities. There is moderate left ventricular hypertrophy. Left ventricular diastolic parameters were normal.  2. Right ventricular systolic function is normal. The right ventricular size is normal.  3. Left atrial size was mildly  dilated.  4. The mitral valve is normal in structure. No evidence of mitral valve regurgitation.  5. The aortic valve is tricuspid. Aortic valve regurgitation is not visualized. No aortic stenosis is present. FINDINGS  Left Ventricle: Left ventricular ejection fraction, by estimation, is 60 to 65%. The left ventricle has normal function. The left ventricle has no regional wall motion abnormalities. The left ventricular internal  cavity size was normal in size. There is  moderate left ventricular hypertrophy. Left ventricular diastolic parameters were normal. Right Ventricle: The right ventricular size is normal. Right vetricular wall thickness was not assessed. Right ventricular systolic function is normal. There is mildly elevated pulmonary artery systolic pressure. The tricuspid regurgitant velocity is 2.55 m/s, and with an assumed right atrial pressure of 8 mmHg, the estimated right ventricular systolic pressure is 63.8 mmHg. Left Atrium: Left atrial size was mildly dilated. Right Atrium: Right atrial size was normal in size. Pericardium: Trivial pericardial effusion is present. Presence of pericardial fat pad. Mitral Valve: The mitral valve is normal in structure. No evidence of mitral valve regurgitation. Tricuspid Valve: The tricuspid valve is normal in structure. Tricuspid valve regurgitation is trivial. Aortic Valve: The aortic valve is tricuspid. Aortic valve regurgitation is not visualized. No aortic stenosis is present. Pulmonic Valve: The pulmonic valve was not well visualized. Pulmonic valve regurgitation is not visualized. Aorta: The aortic root is normal in size and structure. IAS/Shunts: The interatrial septum was not well visualized.  LEFT VENTRICLE PLAX 2D LVIDd:         4.85 cm  Diastology LVIDs:         3.13 cm  LV e' lateral:   10.80 cm/s LV PW:         1.35 cm  LV E/e' lateral: 9.4 LV IVS:        1.41 cm  LV e' medial:    9.03 cm/s LVOT diam:     2.00 cm  LV E/e' medial:  11.2 LV SV:         76 LV SV Index:   35 LVOT Area:     3.14 cm  RIGHT VENTRICLE RV Mid diam:    3.04 cm RV S prime:     19.60 cm/s TAPSE (M-mode): 2.1 cm LEFT ATRIUM             Index LA diam:        4.10 cm 1.89 cm/m LA Vol (A2C):   95.3 ml 43.83 ml/m LA Vol (A4C):   66.2 ml 30.45 ml/m LA Biplane Vol: 80.8 ml 37.16 ml/m  AORTIC VALVE LVOT Vmax:   112.00 cm/s LVOT Vmean:  79.100 cm/s LVOT VTI:    0.241 m  AORTA Ao Root diam: 3.10 cm MITRAL VALVE                 TRICUSPID VALVE MV Area (PHT): 3.07 cm     TR Peak grad:   26.0 mmHg MV Decel Time: 247 msec     TR Vmax:        255.00 cm/s MV E velocity: 101.00 cm/s MV A velocity: 95.10 cm/s   SHUNTS MV E/A ratio:  1.06         Systemic VTI:  0.24 m                             Systemic Diam: 2.00 cm Oswaldo Milian MD Electronically signed by Oswaldo Milian MD Signature Date/Time: 06/05/2019/10:46:22 PM  Final      CBC Recent Labs  Lab 06/04/19 2005 06/05/19 0421 06/06/19 0420 06/07/19 0425 06/08/19 0354  WBC 9.7 11.0* 9.4 8.4 7.3  HGB 7.6* 7.5* 6.5* 7.6* 7.2*  HCT 23.1* 23.2* 20.3* 23.5* 22.2*  PLT 387 362 310 317 287  MCV 93.9 95.5 94.9 92.9 93.7  MCH 30.9 30.9 30.4 30.0 30.4  MCHC 32.9 32.3 32.0 32.3 32.4  RDW 18.6* 18.9* 19.0* 18.3* 18.2*  LYMPHSABS 1.2  --   --   --   --   MONOABS 0.5  --   --   --   --   EOSABS 0.0  --   --   --   --   BASOSABS 0.0  --   --   --   --     Chemistries  Recent Labs  Lab 06/04/19 2005 06/04/19 2005 06/05/19 0421 06/05/19 1540 06/06/19 0420 06/07/19 0425 06/08/19 0354  NA 139   < > 141 143 145 148* 145  K 2.4*   < > 2.7* 2.8* 2.7* 3.1* 2.7*  CL 114*   < > 116* 120* 120* 119* 118*  CO2 15*   < > 15* 16* 18* 19* 20*  GLUCOSE 124*   < > 123* 127* 108* 114* 115*  BUN 29*   < > 26* 24* 22 17 14   CREATININE 1.38*   < > 1.28* 1.16 0.99 1.07 0.96  CALCIUM 6.4*   < > 6.5* 6.5* 6.9* 7.1* 7.0*  MG  --   --  1.3*  --  1.9  --  1.7  AST 43*  --  47*  --  33  --  37  ALT 32  --  32  --  27  --  23  ALKPHOS 115  --  114  --  89  --  90  BILITOT 1.0  --  1.0  --  0.7  --  0.5   < > = values in this interval not displayed.   ------------------------------------------------------------------------------------------------------------------ No results for input(s): CHOL, HDL, LDLCALC, TRIG, CHOLHDL, LDLDIRECT in the last 72 hours.  Lab Results  Component Value Date   HGBA1C 6.9 (H) 05/13/2018    ------------------------------------------------------------------------------------------------------------------ No results for input(s): TSH, T4TOTAL, T3FREE, THYROIDAB in the last 72 hours.  Invalid input(s): FREET3 ------------------------------------------------------------------------------------------------------------------ No results for input(s): VITAMINB12, FOLATE, FERRITIN, TIBC, IRON, RETICCTPCT in the last 72 hours.  Coagulation profile Recent Labs  Lab 06/04/19 2005  INR 1.2    No results for input(s): DDIMER in the last 72 hours.  Cardiac Enzymes No results for input(s): CKMB, TROPONINI, MYOGLOBIN in the last 168 hours.  Invalid input(s): CK ------------------------------------------------------------------------------------------------------------------    Component Value Date/Time   BNP 238.0 (H) 06/05/2019 5277     Roxan Hockey M.D on 06/08/2019 at 11:26 AM  Go to [email or web address] - for contact info  [facility] - Office  [phone]

## 2019-06-08 NOTE — Care Management Important Message (Signed)
Important Message  Patient Details  Name: Darren Howard MRN: [id number] Date of Birth: 03/28/1950   Medicare Important Message Given:  Yes     [name] 06/08/2019, 4:40 PM

## 2019-06-08 NOTE — Progress Notes (Addendum)
Subjective:  Feeling better. More alert. Knows where he is and the president. Not clear on the date. Denies abdominal pain. Nursing staff report he had several mushy BMs over night. No melena, brbpr. Five stools reported over the last 24 hours. Two since 7am this morning. Per nursing small to medium amount at a time, soft but no runny.  Objective: Vital signs in last 24 hours: Temp:  [98 F (36.7 C)-99.4 F (37.4 C)] 98 F (36.7 C) (04/21 0400) Pulse Rate:  [65-85] 78 (04/21 0600) Resp:  [0-33] 17 (04/21 0600) BP: (118-171)/(66-102) 143/94 (04/21 0600) SpO2:  [42 %-100 %] 94 % (04/21 0600) Weight:  [98.7 kg] 98.7 kg (04/21 0500) Last BM Date: 06/07/19 General:   Alert,  Oriented to person, place. Eyes:  Sclera clear, no icterus.  Abdomen:  Soft, nontender and nondistended. Normal bowel sounds, without guarding, and without rebound.   Extremities:  Without clubbing, deformity. Pedal edema bilaterally, 2+. Neurologic:  Alert and  oriented X2;  grossly normal neurologically. Skin:  Intact without significant lesions or rashes. Psych:  Alert and cooperative. Normal mood and affect.  Intake/Output from previous day: 04/20 0701 - 04/21 0700 In: 899.9 [I.V.:899.9] Out: 200 [Urine:200] Intake/Output this shift: No intake/output data recorded.  Lab Results: CBC Recent Labs    06/06/19 0420 06/07/19 0425 06/08/19 0354  WBC 9.4 8.4 7.3  HGB 6.5* 7.6* 7.2*  HCT 20.3* 23.5* 22.2*  MCV 94.9 92.9 93.7  PLT 310 317 287   BMET Recent Labs    06/06/19 0420 06/07/19 0425 06/08/19 0354  NA 145 148* 145  K 2.7* 3.1* 2.7*  CL 120* 119* 118*  CO2 18* 19* 20*  GLUCOSE 108* 114* 115*  BUN 22 17 14   CREATININE 0.99 1.07 0.96  CALCIUM 6.9* 7.1* 7.0*   LFTs Recent Labs    06/06/19 0420 06/08/19 0354  BILITOT 0.7 0.5  ALKPHOS 89 90  AST 33 37  ALT 27 23  PROT 5.1* 4.8*  ALBUMIN 2.2* 2.0*   No results for input(s): LIPASE in the last 72 hours. PT/INR No results for input(s):  LABPROT, INR in the last 72 hours.    Imaging Studies: CT Abdomen Pelvis W Contrast  Result Date: 06/04/2019 CLINICAL DATA:  69 year old male with abdominal distension and diarrhea. Concern for acute diverticulitis. EXAM: CT ABDOMEN AND PELVIS WITH CONTRAST TECHNIQUE: Multidetector CT imaging of the abdomen and pelvis was performed using the standard protocol following bolus administration of intravenous contrast. CONTRAST:  166mL OMNIPAQUE IOHEXOL 300 MG/ML  SOLN COMPARISON:  None. FINDINGS: Lower chest: The visualized lung bases are clear. No intra-abdominal free air or free fluid. Hepatobiliary: Severe fatty infiltration of the liver. No intrahepatic biliary ductal dilatation. The gallbladder is unremarkable. Pancreas: Atrophic pancreas. No Accu to inflammatory changes. No dilatation of the main pancreatic duct. Spleen: Normal in size without focal abnormality. Adrenals/Urinary Tract: The adrenal glands are unremarkable. There is mild bilateral hydronephrosis, right greater left. There is enhancement of the right renal urothelium concerning for pyelonephritis. Correlation with urinalysis recommended. There is slight delayed enhancement of the right renal parenchyma. Multiple right renal cysts measure up to 4 cm in the interpolar aspect of the right kidney. Additional subcentimeter right renal hypodense lesions are too small to characterize. The urinary bladder is distended. Stomach/Bowel: There is narrowing of a segment of the rectosigmoid, likely related to mass effect caused by distended urinary bladder. There is mild thickened appearance of the segment of colon with surrounding stranding. The perisigmoid stranding  may be related to UTI but concerning for stercoral colitis. Clinical correlation is recommended. There is probable mild associate luminal narrowing of the sigmoid colon. No evidence of small-bowel obstruction. The appendix is not visualized with certainty. No inflammatory changes identified in  the right lower quadrant. Vascular/Lymphatic: The abdominal aorta and IVC unremarkable. No portal venous gas. There is no adenopathy. Reproductive: The prostate and seminal vesicles are grossly unremarkable. Partially visualized fluid within the left inguinal canal likely related to hydrocele. Other: Mild diffuse subcutaneous edema. Musculoskeletal: Degenerative changes of the spine. No acute osseous pathology. IMPRESSION: 1. Distended urinary bladder with mild bilateral hydronephrosis and findings of right-sided pyelonephritis. Correlation with urinalysis recommended. 2. Thickened appearance of the rectosigmoid with surrounding stranding which may be related to UTI but concerning for stercoral colitis. Clinical correlation is recommended. 3. Severe fatty liver. Electronically Signed   By: Anner Crete M.D.   On: 06/04/2019 23:41   US Venous Img Lower Bilateral (DVT)  Result Date: 06/05/2019 CLINICAL DATA:  Bilateral lower extremity pain and edema for 1 month. EXAM: BILATERAL LOWER EXTREMITY VENOUS DOPPLER ULTRASOUND TECHNIQUE: Gray-scale sonography with compression, as well as color and duplex ultrasound, were performed to evaluate the deep venous system(s) from the level of the common femoral vein through the popliteal and proximal calf veins. COMPARISON:  None. FINDINGS: VENOUS Normal compressibility of the common femoral, superficial femoral, and popliteal veins, as well as the visualized calf veins. Visualized portions of profunda femoral vein and great saphenous vein unremarkable. No filling defects to suggest DVT on grayscale or color Doppler imaging. Doppler waveforms show normal direction of venous flow, normal respiratory phasicity and response to augmentation. OTHER None. Limitations: none IMPRESSION: No femoropopliteal DVT nor evidence of DVT within the visualized calf veins. If clinical symptoms are inconsistent or if there are persistent or worsening symptoms, further imaging (possibly  involving the iliac veins) may be warranted. Electronically Signed   By: Abigail Miyamoto M.D.   On: 06/05/2019 11:44   DG Chest Port 1 View  Result Date: 06/04/2019 CLINICAL DATA:  Tachycardia, bilateral lower extremity edema EXAM: PORTABLE CHEST 1 VIEW COMPARISON:  05/13/2018 FINDINGS: The heart size and mediastinal contours are within normal limits. Both lungs are clear. The visualized skeletal structures are unremarkable. IMPRESSION: No active disease. Electronically Signed   By: Randa Ngo M.D.   On: 06/04/2019 20:32   ECHOCARDIOGRAM COMPLETE  Result Date: 06/05/2019    ECHOCARDIOGRAM REPORT   Patient Name:   Darren Howard Date of Exam: 06/05/2019 Medical Rec #:  [id number]     Height:       73.0 in Accession #:    [id number]    Weight:       205.0 lb Date of Birth:  May 20, 1950    BSA:          2.174 m Patient Age:    36 years      BP:           152/86 mmHg Patient Gender: M             HR:           90 bpm. Exam Location:  [facility] Procedure: 2D Echo, Cardiac Doppler and Color Doppler Indications:    Lower extremity edema [218969]                 Alcohol abuse [818299]  History:        Patient has no prior history of Echocardiogram examinations.  Risk Factors:Hypertension, Diabetes, Current Smoker and                 Dyslipidemia.  Sonographer:    Alvino Chapel RCS Referring Phys: [place]  1. Left ventricular ejection fraction, by estimation, is 60 to 65%. The left ventricle has normal function. The left ventricle has no regional wall motion abnormalities. There is moderate left ventricular hypertrophy. Left ventricular diastolic parameters were normal.  2. Right ventricular systolic function is normal. The right ventricular size is normal.  3. Left atrial size was mildly dilated.  4. The mitral valve is normal in structure. No evidence of mitral valve regurgitation.  5. The aortic valve is tricuspid. Aortic valve regurgitation is not visualized. No aortic  stenosis is present. FINDINGS  Left Ventricle: Left ventricular ejection fraction, by estimation, is 60 to 65%. The left ventricle has normal function. The left ventricle has no regional wall motion abnormalities. The left ventricular internal cavity size was normal in size. There is  moderate left ventricular hypertrophy. Left ventricular diastolic parameters were normal. Right Ventricle: The right ventricular size is normal. Right vetricular wall thickness was not assessed. Right ventricular systolic function is normal. There is mildly elevated pulmonary artery systolic pressure. The tricuspid regurgitant velocity is 2.55 m/s, and with an assumed right atrial pressure of 8 mmHg, the estimated right ventricular systolic pressure is 72.0 mmHg. Left Atrium: Left atrial size was mildly dilated. Right Atrium: Right atrial size was normal in size. Pericardium: Trivial pericardial effusion is present. Presence of pericardial fat pad. Mitral Valve: The mitral valve is normal in structure. No evidence of mitral valve regurgitation. Tricuspid Valve: The tricuspid valve is normal in structure. Tricuspid valve regurgitation is trivial. Aortic Valve: The aortic valve is tricuspid. Aortic valve regurgitation is not visualized. No aortic stenosis is present. Pulmonic Valve: The pulmonic valve was not well visualized. Pulmonic valve regurgitation is not visualized. Aorta: The aortic root is normal in size and structure. IAS/Shunts: The interatrial septum was not well visualized.  LEFT VENTRICLE PLAX 2D LVIDd:         4.85 cm  Diastology LVIDs:         3.13 cm  LV e' lateral:   10.80 cm/s LV PW:         1.35 cm  LV E/e' lateral: 9.4 LV IVS:        1.41 cm  LV e' medial:    9.03 cm/s LVOT diam:     2.00 cm  LV E/e' medial:  11.2 LV SV:         76 LV SV Index:   35 LVOT Area:     3.14 cm  RIGHT VENTRICLE RV Mid diam:    3.04 cm RV S prime:     19.60 cm/s TAPSE (M-mode): 2.1 cm LEFT ATRIUM             Index LA diam:        4.10 cm  1.89 cm/m LA Vol (A2C):   95.3 ml 43.83 ml/m LA Vol (A4C):   66.2 ml 30.45 ml/m LA Biplane Vol: 80.8 ml 37.16 ml/m  AORTIC VALVE LVOT Vmax:   112.00 cm/s LVOT Vmean:  79.100 cm/s LVOT VTI:    0.241 m  AORTA Ao Root diam: 3.10 cm MITRAL VALVE                TRICUSPID VALVE MV Area (PHT): 3.07 cm     TR Peak grad:   26.0  mmHg MV Decel Time: 247 msec     TR Vmax:        255.00 cm/s MV E velocity: 101.00 cm/s MV A velocity: 95.10 cm/s   SHUNTS MV E/A ratio:  1.06         Systemic VTI:  0.24 m                             Systemic Diam: 2.00 cm Oswaldo Milian MD Electronically signed by Oswaldo Milian MD Signature Date/Time: 06/05/2019/10:46:22 PM    Final   [2 weeks]   Assessment: 69 y/o male with h/o etoh abuse, chronic anemia with blood transfusions on several prior occasions during hospitalization last year, admitted with right-sided pyeloneophritis and possible stercoral colitis on CT. Acute drop in Hgb from baseline to 6.5, receiving 1 unit of prbcs with improvement of Hgb to 7.6. His baseline has been in the 7/8 range. No overt GI bleeding but he is heme positive. Now with etoh withdrawal.   Acute on chronic anemia: stool brown. Heme +. EGD last year with erosive gastropathy, duodenal erosions, likely due to NSAIDs. Colonoscopy with inadequate prep 05/2018 and large polyp piecemeal removed, overdue for early interval surveillance. Would benefit from colonoscopy with adequate prep prior to discharge in possible.   ?Stercoral colitis:CT showed narrowing of segment of rectosigmoid, likely related to mass effect caused by distended urinary bladder. There is mild thickened appearance of the rectosigmoid with surrounding stranding may be related to UTI but concerning for stercoral colitis. Possible associated luminal narrowing of sigmoid colon (mild). Incomplete DRE due to pain from prolapsing grade 3 hemorrhoids. Multiple soft/loose stools this admission. I reviewed CT imaging personally with the  radiologist today. Stool noted throughout colon/rectum but did not have appearance of impaction and likely had some retention of stool due to compression by large urinary bladder. No masses seen. Patient now has foley catheter.   Abdominal distention: no N/V. CT on admission.no significant distention today.  Plan:  1. Continue to monitor stool output. Having multiple mushy stools per day. Nonbloody. Consider bowel regimen at time of discharge but hold off right now given stool frequency. Consider Cdiff testing if stools more loose (previous attempt rejected due to stool consistency) 2. Complete antibiotic course. If patient without improvement, could consider colonoscopy this admission prior to discharge. 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   3. Continue to monitor H/H 4. Continue anusol.  Laureen Ochs. Bernarda Caffey [facility] [phone] 4/21/20219:17 AM    LOS: 3 days

## 2019-06-09 DIAGNOSIS — R933 Abnormal findings on diagnostic imaging of other parts of digestive tract: Secondary | ICD-10-CM | POA: Diagnosis not present

## 2019-06-09 DIAGNOSIS — F1010 Alcohol abuse, uncomplicated: Secondary | ICD-10-CM | POA: Diagnosis not present

## 2019-06-09 DIAGNOSIS — R197 Diarrhea, unspecified: Secondary | ICD-10-CM | POA: Diagnosis not present

## 2019-06-09 LAB — COMPREHENSIVE METABOLIC PANEL
ALT: 24 U/L (ref 0–44)
AST: 42 U/L — ABNORMAL HIGH (ref 15–41)
Albumin: 1.9 g/dL — ABNORMAL LOW (ref 3.5–5.0)
Alkaline Phosphatase: 92 U/L (ref 38–126)
Anion gap: 8 (ref 5–15)
BUN: 12 mg/dL (ref 8–23)
CO2: 18 mmol/L — ABNORMAL LOW (ref 22–32)
Calcium: 7.1 mg/dL — ABNORMAL LOW (ref 8.9–10.3)
Chloride: 118 mmol/L — ABNORMAL HIGH (ref 98–111)
Creatinine, Ser: 0.93 mg/dL (ref 0.61–1.24)
GFR calc Af Amer: >60 mL/min (ref >60)
GFR calc non Af Amer: >60 mL/min (ref >60)
Glucose, Bld: 104 mg/dL — ABNORMAL HIGH (ref 70–99)
Potassium: 2.6 mmol/L — CL (ref 3.5–5.1)
Sodium: 144 mmol/L (ref 135–145)
Total Bilirubin: 0.5 mg/dL (ref 0.3–1.2)
Total Protein: 4.6 g/dL — ABNORMAL LOW (ref 6.5–8.1)

## 2019-06-09 LAB — CULTURE, BLOOD (ROUTINE X 2)
Culture: NO GROWTH
Culture: NO GROWTH
Special Requests: ADEQUATE
Special Requests: ADEQUATE

## 2019-06-09 LAB — CBC
HCT: 22.1 % — ABNORMAL LOW (ref 39.0–52.0)
Hemoglobin: 7.1 g/dL — ABNORMAL LOW (ref 13.0–17.0)
MCH: 30.2 pg (ref 26.0–34.0)
MCHC: 32.1 g/dL (ref 30.0–36.0)
MCV: 94 fL (ref 80.0–100.0)
Platelets: 299 10*3/uL (ref 150–400)
RBC: 2.35 MIL/uL — ABNORMAL LOW (ref 4.22–5.81)
RDW: 17.9 % — ABNORMAL HIGH (ref 11.5–15.5)
WBC: 7.1 10*3/uL (ref 4.0–10.5)
nRBC: 0 % (ref 0.0–0.2)

## 2019-06-09 LAB — C DIFFICILE (CDIFF) QUICK SCRN (NO PCR REFLEX)
C Diff antigen: NEGATIVE
C Diff interpretation: NOT DETECTED
C Diff toxin: NEGATIVE

## 2019-06-09 LAB — MAGNESIUM: Magnesium: 1.8 mg/dL (ref 1.7–2.4)

## 2019-06-09 MED ORDER — POTASSIUM CHLORIDE 20 MEQ PO PACK
40.0000 meq | PACK | ORAL | Status: DC
  Administered 2019-06-09 (×3): 40 meq via ORAL
  Filled 2019-06-09 (×3): qty 2

## 2019-06-09 MED ORDER — TAMSULOSIN HCL 0.4 MG PO CAPS
0.4000 mg | ORAL_CAPSULE | Freq: Two times a day (BID) | ORAL | Status: DC
  Administered 2019-06-09 – 2019-06-20 (×22): 0.4 mg via ORAL
  Filled 2019-06-09 (×23): qty 1

## 2019-06-09 MED ORDER — METRONIDAZOLE 500 MG PO TABS
500.0000 mg | ORAL_TABLET | Freq: Three times a day (TID) | ORAL | Status: AC
  Administered 2019-06-09 – 2019-06-12 (×11): 500 mg via ORAL
  Filled 2019-06-09 (×12): qty 1

## 2019-06-09 MED ORDER — KCL IN DEXTROSE-NACL 20-5-0.45 MEQ/L-%-% IV SOLN: INTRAVENOUS | Status: DC

## 2019-06-09 NOTE — Progress Notes (Signed)
Patient Demographics:    Darren Howard, is a 69 y.o. male, DOB - [birth year], QQI:[id number]  Admit date - 06/04/2019   Admitting Physician Kathie Dike, MD  Outpatient Primary MD for the patient is [name], MD  LOS - 4   Chief Complaint  Patient presents with  . Leg Swelling        Subjective:    Darren Howard today has no fevers, no emesis,  No chest pain,  -Patient starting to have mucousy stool--more than 9 stools in the last 24 hours C. difficile negative GI pathogen pending -Unable to void after removal of Foley catheter --Bladder scan over 500 Foley reinserted with 525 mL removed  Assessment  & Plan :    Principal Problem:   Pyelonephritis of right kidney Active Problems:   Hypertension   Normocytic anemia   DM2 (diabetes mellitus, type 2) (HCC)   Hypokalemia   Hypocalcemia   Alcohol abuse   Tobacco use   Pyelonephritis   Abnormal CT scan, colon   Diarrhea  Brief Summary:- 69 y.o. male with medical history significant of alcohol abuse, chronic normocytic anemia, medication and treatment noncompliance, history of duodenal reduction, erosive gastropathy, GI bleed, glucose intolerance/type 2 diabetes, hyperlipidemia, hypertension admitted on 06/05/2019 with possible right-sided pyelonephritis, as well as stercoral colitis and found to have alcohol withdrawal symptoms  A/p 1)Stercoral Colitis--- patient previously received Vancomycin, Rocephin and Cefepime  --continue Augmentin -Patient starting to have mucousy stool, C. difficile negative GI pathogen pending---empirically treat with Flagyl pending GI pathogen results -More than 9 stools in the last 24 hours risk for dehydration start IV fluids -GI input/consult appreciated -Continue Anusol AC for hemorrhoids  2)Alcohol Abuse with DTs--- pDT symptoms appears to be mostly resolved,  --continue folic acid, thiamine/multivitamin  and lorazepam per CIWA protocol =--more coherent  3)DM2--last A1c 6.9 reflecting excellent DM control PTA -Metformin on hold Use Novolog/Humalog Sliding scale insulin with Accu-Cheks/Fingersticks as ordered   4) hypocalcemia/hypokalemia/hypomagnesemia and hypoalbuminemia----partly due to poor nutritional intake in an alcoholic male----replace and recheck -Patient is less confused now, oral intake is much better  5)HTN--stable, continue Coreg at reduced dose of 12.5 mg twice daily, hold amlodipine  6) suspected right-sided pyelonephritis--- urine and blood cultures negative, antibiotics as above #1 for stercoral colitis rather than pyelonephritis  7) acute on chronic symptomatic anemia----Hgb up to 7.1 from 6.5 after 1 unit of blood was transfused on 06/06/2019, -no significant tachycardia at this time most likely due to Coreg use --Stool occult blood is positive, no overt GI bleed at this time -GI consult appreciated recommends possible outpatient colonoscopy given poor prep with last colonoscopy and polyp removal at the time  8)Urinary retention-- -Unable to void after removal of Foley catheter --Bladder scan over 500 Foley reinserted with 525 mL removed -Flomax as ordered  Disposition/Need for in-Hospital Stay- patient unable to be discharged at this time due to ---alcohol withdrawal symptoms requiring IV lorazepam and IV hydration pending improvement in mental status to allow for adequate oral intake -Patient From: home  D/C Place: home  Barriers: Not Clinically Stable- --- persistent diarrhea urinary retention within GI pathogen results--- high risk for dehydration given frequency of stools  Code Status : full  Family Communication:   -Discussed with  son [name] who is visiting  Consults  : GI   DVT Prophylaxis  :   - SCDs   Lab Results  Component Value Date   PLT 299 06/09/2019    Inpatient Medications  Scheduled Meds: . amoxicillin-clavulanate  1 tablet Oral Q12H  .  carvedilol  12.5 mg Oral BID  . Chlorhexidine Gluconate Cloth  6 each Topical Daily  . folic acid  1 mg Oral Daily  . hydrocortisone   Rectal QID  . hydrocortisone-pramoxine  1 applicator Rectal BID  . metroNIDAZOLE  500 mg Oral TID  . multivitamin with minerals  1 tablet Oral Daily  . pantoprazole  40 mg Oral BID AC  . potassium chloride  40 mEq Oral Q4H  . rifaximin  550 mg Oral BID  . tamsulosin  0.4 mg Oral QPC supper  . thiamine  100 mg Oral Daily   Or  . thiamine  100 mg Intravenous Daily   Continuous Infusions:  PRN Meds:.polyethylene glycol, traMADol    Anti-infectives (From admission, onward)   Start     Dose/Rate Route Frequency Ordered Stop   06/09/19 1800  metroNIDAZOLE (FLAGYL) tablet 500 mg     500 mg Oral 3 times daily 06/09/19 1745     06/07/19 2130  rifaximin (XIFAXAN) tablet 550 mg     550 mg Oral 2 times daily 06/07/19 2119     06/06/19 2200  amoxicillin-clavulanate (AUGMENTIN) 875-125 MG per tablet 1 tablet     1 tablet Oral Every 12 hours 06/06/19 1822     06/05/19 0645  metroNIDAZOLE (FLAGYL) IVPB 500 mg     500 mg 100 mL/hr over 60 Minutes Intravenous  Once 06/05/19 0640 06/05/19 0954   06/05/19 0600  ceFEPIme (MAXIPIME) 2 g in sodium chloride 0.9 % 100 mL IVPB  Status:  Discontinued     2 g 200 mL/hr over 30 Minutes Intravenous Every 8 hours 06/05/19 0217 06/05/19 0357   06/05/19 0400  cefTRIAXone (ROCEPHIN) 1 g in sodium chloride 0.9 % 100 mL IVPB  Status:  Discontinued     1 g 200 mL/hr over 30 Minutes Intravenous Every 24 hours 06/05/19 0357 06/06/19 1822   06/04/19 2130  vancomycin (VANCOREADY) IVPB 2000 mg/400 mL     2,000 mg 200 mL/hr over 120 Minutes Intravenous  Once 06/04/19 2130 06/05/19 0216   06/04/19 2115  ceFEPIme (MAXIPIME) 2 g in sodium chloride 0.9 % 100 mL IVPB     2 g 200 mL/hr over 30 Minutes Intravenous  Once 06/04/19 2105 06/04/19 2226   06/04/19 2115  metroNIDAZOLE (FLAGYL) IVPB 500 mg     500 mg 100 mL/hr over 60 Minutes  Intravenous  Once 06/04/19 2105 06/05/19 0028   06/04/19 2115  vancomycin (VANCOCIN) IVPB 1000 mg/200 mL premix  Status:  Discontinued     1,000 mg 200 mL/hr over 60 Minutes Intravenous  Once 06/04/19 2105 06/04/19 2130        Objective:   Vitals:   06/08/19 2150 06/09/19 0530 06/09/19 0927 06/09/19 1437  BP: (!) 141/76 (!) 143/63  118/79  Pulse: 80 77  74  Resp: 20 20  16   Temp: 98.5 F (36.9 C) 99.5 F (37.5 C)  99.4 F (37.4 C)  TempSrc: Oral Oral  Oral  SpO2: 100% 100% 99% 100%  Weight:      Height:        Wt Readings from Last 3 Encounters:  06/08/19 98.7 kg  05/31/18 100 kg  05/17/18  93.5 kg     Intake/Output Summary (Last 24 hours) at 06/09/2019 1856 Last data filed at 06/09/2019 1528 Gross per 24 hour  Intake --  Output 525 ml  Net -525 ml     Physical Exam  Gen:-More coherent, in no acute distress HEENT:- [place].AT, No sclera icterus Neck-Supple Neck,No JVD,.  Lungs-  CTAB , fair symmetrical air movement CV- S1, S2 normal, regular ,  Abd-  +ve B.Sounds, Abd Soft, No tenderness, no CVA area tenderness Extremity/Skin:- No  edema, pedal pulses present  Psych-confused, disoriented neuro-generalized weakness, no new focal deficits,  GU-Foley in situ   Data Review:   Micro Results Recent Results (from the past 240 hour(s))  Culture, blood (routine x 2)     Status: None   Collection Time: 06/04/19  8:05 PM   Specimen: Left Antecubital; Blood  Result Value Ref Range Status   Specimen Description LEFT ANTECUBITAL  Final   Special Requests   Final    BOTTLES DRAWN AEROBIC AND ANAEROBIC Blood Culture adequate volume   Culture   Final    NO GROWTH 5 DAYS Performed at [facility], [address] [place], [place] [address]    Report Status 06/09/2019 FINAL  Final  Urine culture     Status: None   Collection Time: 06/04/19  8:12 PM   Specimen: Urine, Catheterized  Result Value Ref Range Status   Specimen Description   Final    URINE,  CATHETERIZED Performed at [facility], [address], [place], [place] [address]    Special Requests   Final    NONE Performed at [facility], [address], [place], [place] [address]    Culture   Final    NO GROWTH Performed at [facility], [place] [address], [place], [place] [address]    Report Status 06/07/2019 FINAL  Final  Culture, blood (routine x 2)     Status: None   Collection Time: 06/04/19  8:49 PM   Specimen: Right Antecubital; Blood  Result Value Ref Range Status   Specimen Description RIGHT ANTECUBITAL  Final   Special Requests   Final    BOTTLES DRAWN AEROBIC AND ANAEROBIC Blood Culture adequate volume   Culture   Final    NO GROWTH 5 DAYS Performed at [facility], [address] [place], [place] [address]    Report Status 06/09/2019 FINAL  Final  MRSA PCR Screening     Status: None   Collection Time: 06/05/19  6:46 PM   Specimen: Nasopharyngeal  Result Value Ref Range Status   MRSA by PCR NEGATIVE NEGATIVE Final    Comment:        The GeneXpert MRSA Assay (FDA approved for NASAL specimens only), is one component of a comprehensive MRSA colonization surveillance program. It is not intended to diagnose MRSA infection nor to guide or monitor treatment for MRSA infections. Performed at [facility], [address] [place], [place] [address]   Respiratory Panel by RT PCR (Flu A&B, Covid) - Nasopharyngeal Swab     Status: None   Collection Time: 06/06/19  2:51 AM   Specimen: Nasopharyngeal Swab  Result Value Ref Range Status   SARS Coronavirus 2 by RT PCR NEGATIVE NEGATIVE Final    Comment: (NOTE) SARS-CoV-2 target nucleic acids are NOT DETECTED. The SARS-CoV-2 RNA is generally detectable in upper respiratoy specimens during the acute phase of infection. The lowest concentration of SARS-CoV-2 viral copies this assay can detect is 131 copies/mL. A negative  result does not preclude SARS-Cov-2 infection and should not be used as the sole basis for  treatment or other patient management decisions. A negative result may occur with  improper specimen collection/handling, submission of specimen other than nasopharyngeal swab, presence of viral mutation(s) within the areas targeted by this assay, and inadequate number of viral copies (<131 copies/mL). A negative result must be combined with clinical observations, patient history, and epidemiological information. The expected result is Negative. Fact Sheet for Patients:  PinkCheek.be Fact Sheet for Healthcare Providers:  GravelBags.it This test is not yet ap proved or cleared by the [place] FDA and  has been authorized for detection and/or diagnosis of SARS-CoV-2 by FDA under an Emergency Use Authorization (EUA). This EUA will remain  in effect (meaning this test can be used) for the duration of the COVID-19 declaration under Section 564(b)(1) of the Act, 21 U.S.C. section 360bbb-3(b)(1), unless the authorization is terminated or revoked sooner.    Influenza A by PCR NEGATIVE NEGATIVE Final   Influenza B by PCR NEGATIVE NEGATIVE Final    Comment: (NOTE) The Xpert Xpress SARS-CoV-2/FLU/RSV assay is intended as an aid in  the diagnosis of influenza from Nasopharyngeal swab specimens and  should not be used as a sole basis for treatment. Nasal washings and  aspirates are unacceptable for Xpert Xpress SARS-CoV-2/FLU/RSV  testing. Fact Sheet for Patients: PinkCheek.be Fact Sheet for Healthcare Providers: GravelBags.it This test is not yet approved or cleared by the [place] FDA and  has been authorized for detection and/or diagnosis of SARS-CoV-2 by  FDA under an Emergency Use Authorization (EUA). This EUA will remain  in effect (meaning this test can be used) for the duration of the  Covid-19 declaration under Section 564(b)(1) of the Act, 21  U.S.C. section  360bbb-3(b)(1), unless the authorization is  terminated or revoked. Performed at [facility], [address] [place], [place] [address]   C Difficile Quick Screen (NO PCR Reflex)     Status: None   Collection Time: 06/09/19  2:13 PM   Specimen: Stool  Result Value Ref Range Status   C Diff antigen NEGATIVE NEGATIVE Final   C Diff toxin NEGATIVE NEGATIVE Final   C Diff interpretation No C. difficile detected.  Final    Comment: Performed at [facility], [address], [place], [place] [address]    Radiology Reports CT Abdomen Pelvis W Contrast  Result Date: 06/04/2019 CLINICAL DATA:  69 year old male with abdominal distension and diarrhea. Concern for acute diverticulitis. EXAM: CT ABDOMEN AND PELVIS WITH CONTRAST TECHNIQUE: Multidetector CT imaging of the abdomen and pelvis was performed using the standard protocol following bolus administration of intravenous contrast. CONTRAST:  135mL OMNIPAQUE IOHEXOL 300 MG/ML  SOLN COMPARISON:  None. FINDINGS: Lower chest: The visualized lung bases are clear. No intra-abdominal free air or free fluid. Hepatobiliary: Severe fatty infiltration of the liver. No intrahepatic biliary ductal dilatation. The gallbladder is unremarkable. Pancreas: Atrophic pancreas. No Accu to inflammatory changes. No dilatation of the main pancreatic duct. Spleen: Normal in size without focal abnormality. Adrenals/Urinary Tract: The adrenal glands are unremarkable. There is mild bilateral hydronephrosis, right greater left. There is enhancement of the right renal urothelium concerning for pyelonephritis. Correlation with urinalysis recommended. There is slight delayed enhancement of the right renal parenchyma. Multiple right renal cysts measure up to 4 cm in the interpolar aspect of the right kidney. Additional subcentimeter right renal hypodense lesions are too small to characterize. The urinary bladder is distended. Stomach/Bowel: There is  narrowing of a segment of the  rectosigmoid, likely related to mass effect caused by distended urinary bladder. There is mild thickened appearance of the segment of colon with surrounding stranding. The perisigmoid stranding may be related to UTI but concerning for stercoral colitis. Clinical correlation is recommended. There is probable mild associate luminal narrowing of the sigmoid colon. No evidence of small-bowel obstruction. The appendix is not visualized with certainty. No inflammatory changes identified in the right lower quadrant. Vascular/Lymphatic: The abdominal aorta and IVC unremarkable. No portal venous gas. There is no adenopathy. Reproductive: The prostate and seminal vesicles are grossly unremarkable. Partially visualized fluid within the left inguinal canal likely related to hydrocele. Other: Mild diffuse subcutaneous edema. Musculoskeletal: Degenerative changes of the spine. No acute osseous pathology. IMPRESSION: 1. Distended urinary bladder with mild bilateral hydronephrosis and findings of right-sided pyelonephritis. Correlation with urinalysis recommended. 2. Thickened appearance of the rectosigmoid with surrounding stranding which may be related to UTI but concerning for stercoral colitis. Clinical correlation is recommended. 3. Severe fatty liver. Electronically Signed   By: Anner Crete M.D.   On: 06/04/2019 23:41   US Venous Img Lower Bilateral (DVT)  Result Date: 06/05/2019 CLINICAL DATA:  Bilateral lower extremity pain and edema for 1 month. EXAM: BILATERAL LOWER EXTREMITY VENOUS DOPPLER ULTRASOUND TECHNIQUE: Gray-scale sonography with compression, as well as color and duplex ultrasound, were performed to evaluate the deep venous system(s) from the level of the common femoral vein through the popliteal and proximal calf veins. COMPARISON:  None. FINDINGS: VENOUS Normal compressibility of the common femoral, superficial femoral, and popliteal veins, as well as the visualized calf veins. Visualized portions of  profunda femoral vein and great saphenous vein unremarkable. No filling defects to suggest DVT on grayscale or color Doppler imaging. Doppler waveforms show normal direction of venous flow, normal respiratory phasicity and response to augmentation. OTHER None. Limitations: none IMPRESSION: No femoropopliteal DVT nor evidence of DVT within the visualized calf veins. If clinical symptoms are inconsistent or if there are persistent or worsening symptoms, further imaging (possibly involving the iliac veins) may be warranted. Electronically Signed   By: Abigail Miyamoto M.D.   On: 06/05/2019 11:44   DG Chest Port 1 View  Result Date: 06/04/2019 CLINICAL DATA:  Tachycardia, bilateral lower extremity edema EXAM: PORTABLE CHEST 1 VIEW COMPARISON:  05/13/2018 FINDINGS: The heart size and mediastinal contours are within normal limits. Both lungs are clear. The visualized skeletal structures are unremarkable. IMPRESSION: No active disease. Electronically Signed   By: Randa Ngo M.D.   On: 06/04/2019 20:32   ECHOCARDIOGRAM COMPLETE  Result Date: 06/05/2019    ECHOCARDIOGRAM REPORT   Patient Name:   JYRON TURMAN Date of Exam: 06/05/2019 Medical Rec #:  [id number]     Height:       73.0 in Accession #:    [id number]    Weight:       205.0 lb Date of Birth:  [birth year]    BSA:          2.174 m Patient Age:    16 years      BP:           152/86 mmHg Patient Gender: M             HR:           90 bpm. Exam Location:  [facility] Procedure: 2D Echo, Cardiac Doppler and Color Doppler Indications:    Lower extremity edema [218969]  Alcohol abuse [161096]  History:        Patient has no prior history of Echocardiogram examinations.                 Risk Factors:Hypertension, Diabetes, Current Smoker and                 Dyslipidemia.  Sonographer:    Alvino Chapel RCS Referring Phys: [place]  1. Left ventricular ejection fraction, by estimation, is 60 to 65%. The left ventricle has normal  function. The left ventricle has no regional wall motion abnormalities. There is moderate left ventricular hypertrophy. Left ventricular diastolic parameters were normal.  2. Right ventricular systolic function is normal. The right ventricular size is normal.  3. Left atrial size was mildly dilated.  4. The mitral valve is normal in structure. No evidence of mitral valve regurgitation.  5. The aortic valve is tricuspid. Aortic valve regurgitation is not visualized. No aortic stenosis is present. FINDINGS  Left Ventricle: Left ventricular ejection fraction, by estimation, is 60 to 65%. The left ventricle has normal function. The left ventricle has no regional wall motion abnormalities. The left ventricular internal cavity size was normal in size. There is  moderate left ventricular hypertrophy. Left ventricular diastolic parameters were normal. Right Ventricle: The right ventricular size is normal. Right vetricular wall thickness was not assessed. Right ventricular systolic function is normal. There is mildly elevated pulmonary artery systolic pressure. The tricuspid regurgitant velocity is 2.55 m/s, and with an assumed right atrial pressure of 8 mmHg, the estimated right ventricular systolic pressure is 04.5 mmHg. Left Atrium: Left atrial size was mildly dilated. Right Atrium: Right atrial size was normal in size. Pericardium: Trivial pericardial effusion is present. Presence of pericardial fat pad. Mitral Valve: The mitral valve is normal in structure. No evidence of mitral valve regurgitation. Tricuspid Valve: The tricuspid valve is normal in structure. Tricuspid valve regurgitation is trivial. Aortic Valve: The aortic valve is tricuspid. Aortic valve regurgitation is not visualized. No aortic stenosis is present. Pulmonic Valve: The pulmonic valve was not well visualized. Pulmonic valve regurgitation is not visualized. Aorta: The aortic root is normal in size and structure. IAS/Shunts: The interatrial septum was  not well visualized.  LEFT VENTRICLE PLAX 2D LVIDd:         4.85 cm  Diastology LVIDs:         3.13 cm  LV e' lateral:   10.80 cm/s LV PW:         1.35 cm  LV E/e' lateral: 9.4 LV IVS:        1.41 cm  LV e' medial:    9.03 cm/s LVOT diam:     2.00 cm  LV E/e' medial:  11.2 LV SV:         76 LV SV Index:   35 LVOT Area:     3.14 cm  RIGHT VENTRICLE RV Mid diam:    3.04 cm RV S prime:     19.60 cm/s TAPSE (M-mode): 2.1 cm LEFT ATRIUM             Index LA diam:        4.10 cm 1.89 cm/m LA Vol (A2C):   95.3 ml 43.83 ml/m LA Vol (A4C):   66.2 ml 30.45 ml/m LA Biplane Vol: 80.8 ml 37.16 ml/m  AORTIC VALVE LVOT Vmax:   112.00 cm/s LVOT Vmean:  79.100 cm/s LVOT VTI:    0.241 m  AORTA Ao Root diam:  3.10 cm MITRAL VALVE                TRICUSPID VALVE MV Area (PHT): 3.07 cm     TR Peak grad:   26.0 mmHg MV Decel Time: 247 msec     TR Vmax:        255.00 cm/s MV E velocity: 101.00 cm/s MV A velocity: 95.10 cm/s   SHUNTS MV E/A ratio:  1.06         Systemic VTI:  0.24 m                             Systemic Diam: 2.00 cm Oswaldo Milian MD Electronically signed by Oswaldo Milian MD Signature Date/Time: 06/05/2019/10:46:22 PM    Final      CBC Recent Labs  Lab 06/04/19 2005 06/04/19 2005 06/05/19 0421 06/06/19 0420 06/07/19 0425 06/08/19 0354 06/09/19 0518  WBC 9.7   < > 11.0* 9.4 8.4 7.3 7.1  HGB 7.6*   < > 7.5* 6.5* 7.6* 7.2* 7.1*  HCT 23.1*   < > 23.2* 20.3* 23.5* 22.2* 22.1*  PLT 387   < > 362 310 317 287 299  MCV 93.9   < > 95.5 94.9 92.9 93.7 94.0  MCH 30.9   < > 30.9 30.4 30.0 30.4 30.2  MCHC 32.9   < > 32.3 32.0 32.3 32.4 32.1  RDW 18.6*   < > 18.9* 19.0* 18.3* 18.2* 17.9*  LYMPHSABS 1.2  --   --   --   --   --   --   MONOABS 0.5  --   --   --   --   --   --   EOSABS 0.0  --   --   --   --   --   --   BASOSABS 0.0  --   --   --   --   --   --    < > = values in this interval not displayed.    Chemistries  Recent Labs  Lab 06/04/19 2005 06/04/19 2005 06/05/19 0421  06/05/19 0421 06/05/19 1540 06/06/19 0420 06/07/19 0425 06/08/19 0354 06/09/19 0518  NA 139   < > 141   < > 143 145 148* 145 144  K 2.4*   < > 2.7*   < > 2.8* 2.7* 3.1* 2.7* 2.6*  CL 114*   < > 116*   < > 120* 120* 119* 118* 118*  CO2 15*   < > 15*   < > 16* 18* 19* 20* 18*  GLUCOSE 124*   < > 123*   < > 127* 108* 114* 115* 104*  BUN 29*   < > 26*   < > 24* 22 17 14 12   CREATININE 1.38*   < > 1.28*   < > 1.16 0.99 1.07 0.96 0.93  CALCIUM 6.4*   < > 6.5*   < > 6.5* 6.9* 7.1* 7.0* 7.1*  MG  --   --  1.3*  --   --  1.9  --  1.7 1.8  AST 43*  --  47*  --   --  33  --  37 42*  ALT 32  --  32  --   --  27  --  23 24  ALKPHOS 115  --  114  --   --  89  --  90 92  BILITOT 1.0  --  1.0  --   --  0.7  --  0.5 0.5   < > = values in this interval not displayed.   ------------------------------------------------------------------------------------------------------------------ No results for input(s): CHOL, HDL, LDLCALC, TRIG, CHOLHDL, LDLDIRECT in the last 72 hours.  Lab Results  Component Value Date   HGBA1C 6.9 (H) 05/13/2018   ------------------------------------------------------------------------------------------------------------------ No results for input(s): TSH, T4TOTAL, T3FREE, THYROIDAB in the last 72 hours.  Invalid input(s): FREET3 ------------------------------------------------------------------------------------------------------------------ No results for input(s): VITAMINB12, FOLATE, FERRITIN, TIBC, IRON, RETICCTPCT in the last 72 hours.  Coagulation profile Recent Labs  Lab 06/04/19 2005  INR 1.2    No results for input(s): DDIMER in the last 72 hours.  Cardiac Enzymes No results for input(s): CKMB, TROPONINI, MYOGLOBIN in the last 168 hours.  Invalid input(s): CK ------------------------------------------------------------------------------------------------------------------    Component Value Date/Time   BNP 238.0 (H) 06/05/2019 4643     Roxan Hockey M.D on 06/09/2019 at 6:56 PM  Go to [email or web address] - for contact info  [facility] - Office  [phone]

## 2019-06-09 NOTE — Plan of Care (Signed)
  Problem: Acute Rehab PT Goals(only PT should resolve) Goal: Pt Will Go Supine/Side To Sit Outcome: Progressing Flowsheets (Taken 06/09/2019 0938) Pt will go Supine/Side to Sit:  with modified independence  with supervision Goal: Patient Will Transfer Sit To/From Stand Outcome: Progressing Flowsheets (Taken 06/09/2019 [phone]) Patient will transfer sit to/from stand: with supervision Goal: Pt Will Transfer Bed To Chair/Chair To Bed Outcome: Progressing Flowsheets (Taken 06/09/2019 0938) Pt will Transfer Bed to Chair/Chair to Bed: with supervision Goal: Pt Will Ambulate Outcome: Progressing Flowsheets (Taken 06/09/2019 0938) Pt will Ambulate:  100 feet  with supervision  with rolling walker   9:38 AM, 06/09/19 Lonell Grandchild, MPT Physical Therapist with [facility] 336 [phone] office [phone] mobile phone

## 2019-06-09 NOTE — Progress Notes (Signed)
Subjective:  Patient having several BMs, bristol 7. Plans to check stool studies. No n/v. No abd pain. Feels like emptying his bladder okay but notes difficulty with stream. Will dribble urine when he lays down and relaxes. No longer has foley catheter.   Objective: Vital signs in last 24 hours: Temp:  [97.8 F (36.6 C)-99.5 F (37.5 C)] 99.5 F (37.5 C) (04/22 0530) Pulse Rate:  [69-80] 77 (04/22 0530) Resp:  [13-24] 20 (04/22 0530) BP: (107-149)/(60-90) 143/63 (04/22 0530) SpO2:  [91 %-100 %] 99 % (04/22 0927) Last BM Date: 06/08/19 General:   Alert,  Well-developed, well-nourished, pleasant and cooperative in NAD Head:  Normocephalic and atraumatic. Eyes:  Sclera clear, no icterus.  Abdomen:  Soft, nontender, mild distention. Bowel sounds somewhat tympanic today.    Extremities:  Without clubbing, deformity. 2+ pedal edema bilaterally. Neurologic:  Alert and  oriented x4;  grossly normal neurologically. Skin:  Intact without significant lesions or rashes. Psych:  Alert and cooperative. Normal mood and affect.  Intake/Output from previous day: 04/21 0701 - 04/22 0700 In: 2164.1 [P.O.:720; I.V.:1417.1; IV Piggyback:27] Out: 300 [Urine:300] Intake/Output this shift: No intake/output data recorded.  Lab Results: CBC Recent Labs    06/07/19 0425 06/08/19 0354 06/09/19 0518  WBC 8.4 7.3 7.1  HGB 7.6* 7.2* 7.1*  HCT 23.5* 22.2* 22.1*  MCV 92.9 93.7 94.0  PLT 317 287 299   BMET Recent Labs    06/07/19 0425 06/08/19 0354 06/09/19 0518  NA 148* 145 144  K 3.1* 2.7* 2.6*  CL 119* 118* 118*  CO2 19* 20* 18*  GLUCOSE 114* 115* 104*  BUN 17 14 12   CREATININE 1.07 0.96 0.93  CALCIUM 7.1* 7.0* 7.1*   LFTs Recent Labs    06/08/19 0354 06/09/19 0518  BILITOT 0.5 0.5  ALKPHOS 90 92  AST 37 42*  ALT 23 24  PROT 4.8* 4.6*  ALBUMIN 2.0* 1.9*   No results for input(s): LIPASE in the last 72 hours. PT/INR No results for input(s): LABPROT, INR in the last 72  hours.    Imaging Studies: CT Abdomen Pelvis W Contrast  Result Date: 06/04/2019 CLINICAL DATA:  69 year old male with abdominal distension and diarrhea. Concern for acute diverticulitis. EXAM: CT ABDOMEN AND PELVIS WITH CONTRAST TECHNIQUE: Multidetector CT imaging of the abdomen and pelvis was performed using the standard protocol following bolus administration of intravenous contrast. CONTRAST:  150mL OMNIPAQUE IOHEXOL 300 MG/ML  SOLN COMPARISON:  None. FINDINGS: Lower chest: The visualized lung bases are clear. No intra-abdominal free air or free fluid. Hepatobiliary: Severe fatty infiltration of the liver. No intrahepatic biliary ductal dilatation. The gallbladder is unremarkable. Pancreas: Atrophic pancreas. No Accu to inflammatory changes. No dilatation of the main pancreatic duct. Spleen: Normal in size without focal abnormality. Adrenals/Urinary Tract: The adrenal glands are unremarkable. There is mild bilateral hydronephrosis, right greater left. There is enhancement of the right renal urothelium concerning for pyelonephritis. Correlation with urinalysis recommended. There is slight delayed enhancement of the right renal parenchyma. Multiple right renal cysts measure up to 4 cm in the interpolar aspect of the right kidney. Additional subcentimeter right renal hypodense lesions are too small to characterize. The urinary bladder is distended. Stomach/Bowel: There is narrowing of a segment of the rectosigmoid, likely related to mass effect caused by distended urinary bladder. There is mild thickened appearance of the segment of colon with surrounding stranding. The perisigmoid stranding may be related to UTI but concerning for stercoral colitis. Clinical correlation is recommended. There  is probable mild associate luminal narrowing of the sigmoid colon. No evidence of small-bowel obstruction. The appendix is not visualized with certainty. No inflammatory changes identified in the right lower quadrant.  Vascular/Lymphatic: The abdominal aorta and IVC unremarkable. No portal venous gas. There is no adenopathy. Reproductive: The prostate and seminal vesicles are grossly unremarkable. Partially visualized fluid within the left inguinal canal likely related to hydrocele. Other: Mild diffuse subcutaneous edema. Musculoskeletal: Degenerative changes of the spine. No acute osseous pathology. IMPRESSION: 1. Distended urinary bladder with mild bilateral hydronephrosis and findings of right-sided pyelonephritis. Correlation with urinalysis recommended. 2. Thickened appearance of the rectosigmoid with surrounding stranding which may be related to UTI but concerning for stercoral colitis. Clinical correlation is recommended. 3. Severe fatty liver. Electronically Signed   By: Anner Crete M.D.   On: 06/04/2019 23:41   US Venous Img Lower Bilateral (DVT)  Result Date: 06/05/2019 CLINICAL DATA:  Bilateral lower extremity pain and edema for 1 month. EXAM: BILATERAL LOWER EXTREMITY VENOUS DOPPLER ULTRASOUND TECHNIQUE: Gray-scale sonography with compression, as well as color and duplex ultrasound, were performed to evaluate the deep venous system(s) from the level of the common femoral vein through the popliteal and proximal calf veins. COMPARISON:  None. FINDINGS: VENOUS Normal compressibility of the common femoral, superficial femoral, and popliteal veins, as well as the visualized calf veins. Visualized portions of profunda femoral vein and great saphenous vein unremarkable. No filling defects to suggest DVT on grayscale or color Doppler imaging. Doppler waveforms show normal direction of venous flow, normal respiratory phasicity and response to augmentation. OTHER None. Limitations: none IMPRESSION: No femoropopliteal DVT nor evidence of DVT within the visualized calf veins. If clinical symptoms are inconsistent or if there are persistent or worsening symptoms, further imaging (possibly involving the iliac veins) may be  warranted. Electronically Signed   By: Abigail Miyamoto M.D.   On: 06/05/2019 11:44   DG Chest Port 1 View  Result Date: 06/04/2019 CLINICAL DATA:  Tachycardia, bilateral lower extremity edema EXAM: PORTABLE CHEST 1 VIEW COMPARISON:  05/13/2018 FINDINGS: The heart size and mediastinal contours are within normal limits. Both lungs are clear. The visualized skeletal structures are unremarkable. IMPRESSION: No active disease. Electronically Signed   By: Randa Ngo M.D.   On: 06/04/2019 20:32   ECHOCARDIOGRAM COMPLETE  Result Date: 06/05/2019    ECHOCARDIOGRAM REPORT   Patient Name:   Darren Howard Date of Exam: 06/05/2019 Medical Rec #:  [id number]     Height:       73.0 in Accession #:    [id number]    Weight:       205.0 lb Date of Birth:  25-Oct-1950    BSA:          2.174 m Patient Age:    69 years      BP:           152/86 mmHg Patient Gender: M             HR:           90 bpm. Exam Location:  [facility] Procedure: 2D Echo, Cardiac Doppler and Color Doppler Indications:    Lower extremity edema [218969]                 Alcohol abuse [283662]  History:        Patient has no prior history of Echocardiogram examinations.                 Risk Factors:Hypertension,  Diabetes, Current Smoker and                 Dyslipidemia.  Sonographer:    Alvino Chapel RCS Referring Phys: [place]  1. Left ventricular ejection fraction, by estimation, is 60 to 65%. The left ventricle has normal function. The left ventricle has no regional wall motion abnormalities. There is moderate left ventricular hypertrophy. Left ventricular diastolic parameters were normal.  2. Right ventricular systolic function is normal. The right ventricular size is normal.  3. Left atrial size was mildly dilated.  4. The mitral valve is normal in structure. No evidence of mitral valve regurgitation.  5. The aortic valve is tricuspid. Aortic valve regurgitation is not visualized. No aortic stenosis is present. FINDINGS  Left  Ventricle: Left ventricular ejection fraction, by estimation, is 60 to 65%. The left ventricle has normal function. The left ventricle has no regional wall motion abnormalities. The left ventricular internal cavity size was normal in size. There is  moderate left ventricular hypertrophy. Left ventricular diastolic parameters were normal. Right Ventricle: The right ventricular size is normal. Right vetricular wall thickness was not assessed. Right ventricular systolic function is normal. There is mildly elevated pulmonary artery systolic pressure. The tricuspid regurgitant velocity is 2.55 m/s, and with an assumed right atrial pressure of 8 mmHg, the estimated right ventricular systolic pressure is 89.3 mmHg. Left Atrium: Left atrial size was mildly dilated. Right Atrium: Right atrial size was normal in size. Pericardium: Trivial pericardial effusion is present. Presence of pericardial fat pad. Mitral Valve: The mitral valve is normal in structure. No evidence of mitral valve regurgitation. Tricuspid Valve: The tricuspid valve is normal in structure. Tricuspid valve regurgitation is trivial. Aortic Valve: The aortic valve is tricuspid. Aortic valve regurgitation is not visualized. No aortic stenosis is present. Pulmonic Valve: The pulmonic valve was not well visualized. Pulmonic valve regurgitation is not visualized. Aorta: The aortic root is normal in size and structure. IAS/Shunts: The interatrial septum was not well visualized.  LEFT VENTRICLE PLAX 2D LVIDd:         4.85 cm  Diastology LVIDs:         3.13 cm  LV e' lateral:   10.80 cm/s LV PW:         1.35 cm  LV E/e' lateral: 9.4 LV IVS:        1.41 cm  LV e' medial:    9.03 cm/s LVOT diam:     2.00 cm  LV E/e' medial:  11.2 LV SV:         76 LV SV Index:   35 LVOT Area:     3.14 cm  RIGHT VENTRICLE RV Mid diam:    3.04 cm RV S prime:     19.60 cm/s TAPSE (M-mode): 2.1 cm LEFT ATRIUM             Index LA diam:        4.10 cm 1.89 cm/m LA Vol (A2C):   95.3 ml  43.83 ml/m LA Vol (A4C):   66.2 ml 30.45 ml/m LA Biplane Vol: 80.8 ml 37.16 ml/m  AORTIC VALVE LVOT Vmax:   112.00 cm/s LVOT Vmean:  79.100 cm/s LVOT VTI:    0.241 m  AORTA Ao Root diam: 3.10 cm MITRAL VALVE                TRICUSPID VALVE MV Area (PHT): 3.07 cm     TR Peak grad:   26.0 mmHg MV  Decel Time: 247 msec     TR Vmax:        255.00 cm/s MV E velocity: 101.00 cm/s MV A velocity: 95.10 cm/s   SHUNTS MV E/A ratio:  1.06         Systemic VTI:  0.24 m                             Systemic Diam: 2.00 cm Oswaldo Milian MD Electronically signed by Oswaldo Milian MD Signature Date/Time: 06/05/2019/10:46:22 PM    Final   [2 weeks]   Assessment: 69 year old male with history of alcohol abuse, chronic anemia with blood transfusions on several prior occasions during hospitalization last year, admitted with right-sided pyelonephritis and possible stercoral colitis on CT.  Acute drop in hemoglobin from baseline to 6.5, receiving 1 unit of packed red blood cells with improvement of hemoglobin to 7.6.  His baseline has been in the 7/8 range.  No overt GI bleeding but he is heme positive.  He has had some alcohol withdrawals this admission.  Acute on chronic anemia: Stool brown, heme positive. Hemoglobin stable. EGD last year with erosive gastropathy, duodenal erosions, likely due to NSAIDs. Colonoscopy with inadequate prep 05/2018 and large polyp piecemeal removed, overdue for early interval surveillance.  Plans for outpatient colonoscopy.  ?Stercoral colitis:CT showed narrowing of segment of rectosigmoid, likely related to mass effect caused by distended urinary bladder. There is mild thickened appearance of the rectosigmoid with surrounding stranding may be related to UTI but concerning for stercoral colitis. Possible associated luminal narrowing of sigmoid colon (mild). Incomplete DRE due to pain from prolapsing grade 3 hemorrhoids. Multiple soft/loose stools this admission. I reviewed CT imaging  personally with the radiologist today. Stool noted throughout colon/rectum but did not have appearance of impaction and likely had some retention of stool due to compression by large urinary bladder. No masses seen. Patient now has foley catheter.   Urinary retention: patient noted to have significantly distended urinary bladder on admission by CT. Noted only 300cc of urine in the last 24 hours. Bladder scan >500cc this morning. Per documentation by nursing staff, attending to order Flomax. Patient has been on flomax since admission.   Plan: 1. Check stool studies.  2. Colonoscopy planned as outpatient. This will be follow up of CT findings and due to large polyp piecemeal removed 05/2018 and inadequate bowel prep at that time.  3. Management of urinary retention per attending. Consider urology consult.   Laureen Ochs. Bernarda Caffey [facility] [phone] 4/22/202112:17 PM     LOS: 4 days

## 2019-06-09 NOTE — Progress Notes (Signed)
Received critical value of Potassium 2.6, MD notified.

## 2019-06-09 NOTE — Patient Outreach (Signed)
Received a referral from Dr. Delanna Ahmadi office.  Patient has [facility]. Following their workflow I have sent this referral to the [facility] group through email [email or web address] for Care Management.   Thank you

## 2019-06-09 NOTE — Evaluation (Signed)
Physical Therapy Evaluation Patient Details Name: Darren Howard MRN: [id number] DOB: 06-18-1950 Today's Date: 06/09/2019   History of Present Illness  Darren Howard is a 69 y.o. male with medical history significant of alcohol abuse, chronic normocytic anemia, medication and treatment noncompliance, history of duodenal reduction, erosive gastropathy, GI bleed, glucose intolerance/type 2 diabetes, hyperlipidemia, hypertension who is coming to the emergency department due to bilateral lower extremities edema for several weeks, diarrhea with left lower quadrant tenderness since last week and dysuria, frequency and urgency for the past 3 days.  He mentioned initially to the staff that he has not taking his medications because he is "too lazy".  He also stated that he drinks a couple of shots every day.  He denies fever, chills, night sweats, sore throat, wheezing or hemoptysis.  No chest pain, palpitations, diaphoresis, PND, but complains of orthopnea and lower extremity edema.  He denies polyuria, polydipsia, polyphagia or blurred vision.    Clinical Impression  Patient limited for functional mobility as stated below secondary to BLE weakness with soreness in feet, fatigue and fair/poor standing balance.  Patient tends to lean on nearby objects for support during transfers and taking steps, required use of RW for safety and demonstrated good return for using during ambulation in room/hallway without loss of balance.  Patient tolerated sitting up in chair to eat breakfast after therapy - RN aware. Patient will benefit from continued physical therapy in hospital and recommended venue below to increase strength, balance, endurance for safe ADLs and gait.     Follow Up Recommendations Home health PT;Supervision for mobility/OOB;Supervision - Intermittent    Equipment Recommendations  Rolling walker with 5" wheels    Recommendations for Other Services       Precautions / Restrictions  Precautions Precautions: Fall Restrictions Weight Bearing Restrictions: No      Mobility  Bed Mobility Overal bed mobility: Needs Assistance Bed Mobility: Supine to Sit     Supine to sit: Supervision     General bed mobility comments: increased time, labored movement  Transfers Overall transfer level: Needs assistance Equipment used: Rolling walker (2 wheeled);None Transfers: Sit to/from [facility] Sit to Stand: Supervision Stand pivot transfers: Supervision       General transfer comment: unsteady with tendency to lean on nearby objects without use of AD, safer using RW  Ambulation/Gait Ambulation/Gait assistance: Supervision;Min guard Gait Distance (Feet): 70 Feet Assistive device: Rolling walker (2 wheeled);None Gait Pattern/deviations: Decreased step length - right;Decreased step length - left;Decreased stride length Gait velocity: decreased   General Gait Details: unsteady on feet with tendency to lean on nearby objects for support without AD, safer using RW demonstrating slightly labored slow cadence without loss of balance, limited secondary to fatigue and soreness in feet  Stairs            Wheelchair Mobility    Modified Rankin (Stroke Patients Only)       Balance Overall balance assessment: Needs assistance Sitting-balance support: Feet supported;No upper extremity supported Sitting balance-Leahy Scale: Good Sitting balance - Comments: seated at EOB   Standing balance support: During functional activity;No upper extremity supported Standing balance-Leahy Scale: Poor Standing balance comment: fair/poor without AD, fair using RW                             Pertinent Vitals/Pain Pain Assessment: Faces Faces Pain Scale: Hurts a little bit Pain Location: bilateral feet Pain Descriptors / Indicators: Sore Pain Intervention(s):  Limited activity within patient's tolerance;Monitored during session    Darren Howard expects to be discharged to:: Private residence Living Arrangements: Alone Available Help at Discharge: Family;Available PRN/intermittently Type of Home: House Home Access: Stairs to enter Entrance Stairs-Rails: None Entrance Stairs-Number of Steps: 3 Home Layout: One level Home Equipment: Bedside commode      Prior Function Level of Independence: Independent         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, drives     Hand Dominance   Dominant Hand: Right    Extremity/Trunk Assessment   Upper Extremity Assessment Upper Extremity Assessment: Generalized weakness    Lower Extremity Assessment Lower Extremity Assessment: Generalized weakness    Cervical / Trunk Assessment Cervical / Trunk Assessment: Normal  Communication   Communication: No difficulties  Cognition Arousal/Alertness: Awake/alert Behavior During Therapy: WFL for tasks assessed/performed Overall Cognitive Status: Within Functional Limits for tasks assessed                                        General Comments      Exercises     Assessment/Plan    PT Assessment Patient needs continued PT services  PT Problem List Decreased strength;Decreased activity tolerance;Decreased balance;Decreased mobility       PT Treatment Interventions Balance training;Gait training;Stair training;Functional mobility training;Therapeutic activities;Therapeutic exercise;Patient/family education    PT Goals (Current goals can be found in the Care Plan section)  Acute Rehab PT Goals Patient Stated Goal: return home with family to assist PT Goal Formulation: With patient Time For Goal Achievement: 06/16/19 Potential to Achieve Goals: Good    Frequency Min 3X/week   Barriers to discharge        Co-evaluation               AM-PAC PT "6 Clicks" Mobility  Outcome Measure Help needed turning from your back to your side while in a flat bed without using bedrails?: None Help needed  moving from lying on your back to sitting on the side of a flat bed without using bedrails?: A Little Help needed moving to and from a bed to a chair (including a wheelchair)?: A Little Help needed standing up from a chair using your arms (e.g., wheelchair or bedside chair)?: A Little Help needed to walk in hospital room?: A Little Help needed climbing 3-5 steps with a railing? : A Little 6 Click Score: 19    End of Session   Activity Tolerance: Patient tolerated treatment well;Patient limited by fatigue Patient left: in chair;with call bell/phone within reach;with chair alarm set Nurse Communication: Mobility status PT Visit Diagnosis: Unsteadiness on feet (R26.81);Other abnormalities of gait and mobility (R26.89);Muscle weakness (generalized) (M62.81)    Time: 1505-6979 PT Time Calculation (min) (ACUTE ONLY): 26 min   Charges:   PT Evaluation $PT Eval Moderate Complexity: 1 Mod PT Treatments $Therapeutic Activity: 23-37 mins        9:37 AM, 06/09/19 Lonell Grandchild, MPT Physical Therapist with [facility] [phone] office [phone] mobile phone

## 2019-06-09 NOTE — Progress Notes (Addendum)
Patient was bladder scanned because he hasn't been voiding.  Bladder scan volume was >500 mL.  Dr. Denton Brick was notified.  He stated that he would order Flomax.    Patient was bladder scanned again @ 1450.  Bladder scan volume was >500 mL.  44F Foley inserted.  See LDAs

## 2019-06-09 NOTE — Clinical Social Work Note (Signed)
Patient from home. Recommended for HHPT and RW. After multiple referrals to [facility] agencies, [place] with [facility] accepted referral for HHPT. RW ordered through [employer].     Davidmichael Zarazua, Clydene Pugh, LCSW

## 2019-06-10 DIAGNOSIS — R933 Abnormal findings on diagnostic imaging of other parts of digestive tract: Secondary | ICD-10-CM

## 2019-06-10 DIAGNOSIS — R339 Retention of urine, unspecified: Secondary | ICD-10-CM

## 2019-06-10 DIAGNOSIS — R197 Diarrhea, unspecified: Secondary | ICD-10-CM

## 2019-06-10 DIAGNOSIS — N12 Tubulo-interstitial nephritis, not specified as acute or chronic: Secondary | ICD-10-CM | POA: Diagnosis not present

## 2019-06-10 DIAGNOSIS — F1010 Alcohol abuse, uncomplicated: Secondary | ICD-10-CM | POA: Diagnosis not present

## 2019-06-10 LAB — CBC
HCT: 21.4 % — ABNORMAL LOW (ref 39.0–52.0)
Hemoglobin: 6.9 g/dL — CL (ref 13.0–17.0)
MCH: 30.5 pg (ref 26.0–34.0)
MCHC: 32.2 g/dL (ref 30.0–36.0)
MCV: 94.7 fL (ref 80.0–100.0)
Platelets: 282 10*3/uL (ref 150–400)
RBC: 2.26 MIL/uL — ABNORMAL LOW (ref 4.22–5.81)
RDW: 17.9 % — ABNORMAL HIGH (ref 11.5–15.5)
WBC: 7.8 10*3/uL (ref 4.0–10.5)
nRBC: 0 % (ref 0.0–0.2)

## 2019-06-10 LAB — GASTROINTESTINAL PANEL BY PCR, STOOL (REPLACES STOOL CULTURE)

## 2019-06-10 LAB — BASIC METABOLIC PANEL
Anion gap: 5 (ref 5–15)
BUN: 12 mg/dL (ref 8–23)
CO2: 18 mmol/L — ABNORMAL LOW (ref 22–32)
Calcium: 7 mg/dL — ABNORMAL LOW (ref 8.9–10.3)
Chloride: 119 mmol/L — ABNORMAL HIGH (ref 98–111)
Creatinine, Ser: 1.05 mg/dL (ref 0.61–1.24)
GFR calc Af Amer: >60 mL/min (ref >60)
GFR calc non Af Amer: >60 mL/min (ref >60)
Glucose, Bld: 111 mg/dL — ABNORMAL HIGH (ref 70–99)
Potassium: 2.5 mmol/L — CL (ref 3.5–5.1)
Sodium: 142 mmol/L (ref 135–145)

## 2019-06-10 LAB — MAGNESIUM: Magnesium: 1.6 mg/dL — ABNORMAL LOW (ref 1.7–2.4)

## 2019-06-10 LAB — PREPARE RBC (CROSSMATCH)

## 2019-06-10 MED ORDER — LOPERAMIDE HCL 2 MG PO CAPS
2.0000 mg | ORAL_CAPSULE | Freq: Once | ORAL | Status: AC
  Administered 2019-06-10: 2 mg via ORAL
  Filled 2019-06-10: qty 1

## 2019-06-10 MED ORDER — MAGNESIUM SULFATE 2 GM/50ML IV SOLN
2.0000 g | INTRAVENOUS | Status: AC
  Administered 2019-06-10 (×2): 2 g via INTRAVENOUS
  Filled 2019-06-10 (×2): qty 50

## 2019-06-10 MED ORDER — POTASSIUM CHLORIDE CRYS ER 20 MEQ PO TBCR
40.0000 meq | EXTENDED_RELEASE_TABLET | ORAL | Status: AC
  Administered 2019-06-10 (×2): 40 meq via ORAL
  Filled 2019-06-10 (×2): qty 2

## 2019-06-10 MED ORDER — SODIUM CHLORIDE 0.9% IV SOLUTION
Freq: Once | INTRAVENOUS | Status: AC
  Administered 2019-06-10: 500 mL via INTRAVENOUS

## 2019-06-10 NOTE — Progress Notes (Signed)
Physical Therapy Treatment Patient Details Name: Carolyn Maniscalco MRN: [id number] DOB: [birth year] Today's Date: 06/10/2019    History of Present Illness Draydon Clairmont is a 69 y.o. male with medical history significant of alcohol abuse, chronic normocytic anemia, medication and treatment noncompliance, history of duodenal reduction, erosive gastropathy, GI bleed, glucose intolerance/type 2 diabetes, hyperlipidemia, hypertension who is coming to the emergency department due to bilateral lower extremities edema for several weeks, diarrhea with left lower quadrant tenderness since last week and dysuria, frequency and urgency for the past 3 days.  He mentioned initially to the staff that he has not taking his medications because he is "too lazy".  He also stated that he drinks a couple of shots every day.  He denies fever, chills, night sweats, sore throat, wheezing or hemoptysis.  No chest pain, palpitations, diaphoresis, PND, but complains of orthopnea and lower extremity edema.  He denies polyuria, polydipsia, polyphagia or blurred vision.    PT Comments    Patient demonstrates improvement for sitting up at bedside, slightly unsteady on feet during transfer to [facility], stood for approximately 8-10 minutes using RW while being wiped after bowel movement,  increased endurance/distance for ambulation without loss of balance and tolerated sitting up in chair after therapy.  Patient will benefit from continued physical therapy in hospital and recommended venue below to increase strength, balance, endurance for safe ADLs and gait.    Follow Up Recommendations  Home health PT;Supervision for mobility/OOB;Supervision - Intermittent     Equipment Recommendations  Rolling walker with 5" wheels    Recommendations for Other Services       Precautions / Restrictions Precautions Precautions: Fall Restrictions Weight Bearing Restrictions: No    Mobility  Bed Mobility Overal bed mobility: Needs  Assistance Bed Mobility: Supine to Sit     Supine to sit: Supervision     General bed mobility comments: slightly increased time with labored movement  Transfers Overall transfer level: Needs assistance Equipment used: Rolling walker (2 wheeled) Transfers: Sit to/from [facility] Sit to Stand: Supervision Stand pivot transfers: Min guard       General transfer comment: increased time, labored movement  Ambulation/Gait Ambulation/Gait assistance: Supervision Gait Distance (Feet): 75 Feet Assistive device: Rolling walker (2 wheeled) Gait Pattern/deviations: Decreased step length - right;Decreased step length - left;Decreased stride length Gait velocity: decreased   General Gait Details: slightly increased endurance/distance for ambulation with slightly labored cadence without loss of balance, limited secondary to c/o fatigue   Stairs             Wheelchair Mobility    Modified Rankin (Stroke Patients Only)       Balance Overall balance assessment: Needs assistance Sitting-balance support: Feet supported;No upper extremity supported Sitting balance-Leahy Scale: Good Sitting balance - Comments: seated at EOB   Standing balance support: During functional activity;Bilateral upper extremity supported Standing balance-Leahy Scale: Fair Standing balance comment: using RW                            Cognition Arousal/Alertness: Awake/alert Behavior During Therapy: WFL for tasks assessed/performed Overall Cognitive Status: Within Functional Limits for tasks assessed                                        Exercises General Exercises - Lower Extremity Long Arc Quad: Seated;AROM;Strengthening;Both;15 reps Hip Flexion/Marching: Seated;AROM;Strengthening;Both;15 reps Toe  Raises: Seated;AROM;Strengthening;Both;15 reps Heel Raises: Seated;AROM;Strengthening;Both;15 reps    General Comments        Pertinent Vitals/Pain  Pain Assessment: Faces Faces Pain Scale: Hurts little more Pain Location: bilateral feet Pain Descriptors / Indicators: Sore;Discomfort Pain Intervention(s): Limited activity within patient's tolerance;Monitored during session;Repositioned    Home Living                      Prior Function            PT Goals (current goals can now be found in the care plan section) Acute Rehab PT Goals Patient Stated Goal: return home with family to assist PT Goal Formulation: With patient Time For Goal Achievement: 06/16/19 Potential to Achieve Goals: Good Progress towards PT goals: Progressing toward goals    Frequency    Min 3X/week      PT Plan Current plan remains appropriate    Co-evaluation              AM-PAC PT "6 Clicks" Mobility   Outcome Measure  Help needed turning from your back to your side while in a flat bed without using bedrails?: None Help needed moving from lying on your back to sitting on the side of a flat bed without using bedrails?: A Little Help needed moving to and from a bed to a chair (including a wheelchair)?: A Little Help needed standing up from a chair using your arms (e.g., wheelchair or bedside chair)?: A Little Help needed to walk in hospital room?: A Little Help needed climbing 3-5 steps with a railing? : A Little 6 Click Score: 19    End of Session   Activity Tolerance: Patient tolerated treatment well;Patient limited by fatigue Patient left: in chair;with call bell/phone within reach;with chair alarm set Nurse Communication: Mobility status PT Visit Diagnosis: Unsteadiness on feet (R26.81);Other abnormalities of gait and mobility (R26.89);Muscle weakness (generalized) (M62.81)     Time: 6546-5035 PT Time Calculation (min) (ACUTE ONLY): 36 min  Charges:  $Gait Training: 8-22 mins $Therapeutic Exercise: 8-22 mins                     12:38 PM, 06/10/19 Lonell Grandchild, MPT Physical Therapist with [facility] [phone] office [phone] mobile phone

## 2019-06-10 NOTE — Progress Notes (Addendum)
CRITICAL VALUE ALERT  Critical Value:  Hgb 6.9, K+ 2.5  Date & Time Notied:  06/10/19  0750  Provider Notified: Denton Brick  Orders Received/Actions taken: Awaiting orders

## 2019-06-10 NOTE — Progress Notes (Signed)
Patient Demographics:    Darren Howard, is a 69 y.o. male, DOB - [birth year], RDE:[id number]  Admit date - 06/04/2019   Admitting Physician Kathie Dike, MD  Outpatient Primary MD for the patient is [name], MD  LOS - 5  Chief Complaint  Patient presents with  . Leg Swelling        Subjective:    Darren Howard today has no fevers, no emesis,  No chest pain,  -Continues to have mucousy stool -At least 6 BMs today, over 18 BMs in the last 36 hours--- Potassium and magnesium mildly low due to frequent BMs/GI losses -Denies abdominal pain  Assessment  & Plan :    Principal Problem:   Pyelonephritis of right kidney Active Problems:   Hypertension   Normocytic anemia   DM2 (diabetes mellitus, type 2) (HCC)   Hypokalemia   Hypocalcemia   Alcohol abuse   Tobacco use   Pyelonephritis   Abnormal CT scan, colon   Diarrhea   Urinary retention  Brief Summary:- 69 y.o. male with medical history significant of alcohol abuse, chronic normocytic anemia, medication and treatment noncompliance, history of duodenal reduction, erosive gastropathy, GI bleed, glucose intolerance/type 2 diabetes, hyperlipidemia, hypertension admitted on 06/05/2019 with possible right-sided pyelonephritis, as well as stercoral colitis and found to have alcohol withdrawal symptoms -Now with frequent mucousy stools  A/p 1)Stercoral Colitis--- patient previously received Vancomycin, Rocephin and Cefepime  --continue Augmentin --Continues to have mucousy stool -At least 6 BMs today, over 18 BMs in the last 36 hours--- Potassium and magnesium mildly low due to frequent BMs/GI losses -- C. difficile negative GI pathogen pending---empirically treat with Flagyl pending GI pathogen results At  risk for dehydration-IV fluids -GI input/consult appreciated -Continue Anusol AC for hemorrhoids  2)Alcohol Abuse with DTs--- DT  symptoms appears to be  resolved,  --continue folic acid, thiamine/multivitamin and lorazepam per CIWA protocol Patient is coherent  3)DM2--last A1c 6.9 reflecting excellent DM control PTA -Metformin on hold Use Novolog/Humalog Sliding scale insulin with Accu-Cheks/Fingersticks as ordered   4) hypocalcemia/hypokalemia/hypomagnesemia and hypoalbuminemia-----due to GI losses the patient with frequent stools -Replace and recheck  5)HTN--stable, continue Coreg at reduced dose of 12.5 mg twice daily, continue to hold amlodipine  6) suspected right-sided pyelonephritis--- urine and blood cultures negative, antibiotics as above #1 for stercoral colitis rather than pyelonephritis  7) acute on chronic symptomatic anemia----admission hemoglobin was  6.5 , initially received 1 unit of blood on 06/06/2019, -no significant tachycardia at this time most likely due to Coreg use -Hemoglobin back down to 6.9 transfuse 1 unit of packed cells on 06/10/2019 --Stool occult blood is positive, no overt GI bleed at this time -GI consult appreciated recommends possible outpatient colonoscopy given poor prep with last colonoscopy and polyp removal at the time  8)Urinary retention-- -Unable to void after removal of Foley catheter --Bladder scan over 500 Foley reinserted on 06/09/2019 -Flomax as ordered  Disposition/Need for in-Hospital Stay- patient unable to be discharged at this time due to --- frequent stools leading to severe electrolyte abnormalities and risk for dehydration requiring IV fluids and IV replacements of magnesium and potassium -Patient From: home  D/C Place: home  Barriers: Not Clinically Stable- --- persistent diarrhea urinary retention --- high risk for  dehydration and electrolyte abnormalities given frequency of stools  Code Status : full  Family Communication:   -Discussed with son [name]   Consults  : GI   DVT Prophylaxis  :   - SCDs   Lab Results  Component Value Date   PLT 282  06/10/2019    Inpatient Medications  Scheduled Meds: . amoxicillin-clavulanate  1 tablet Oral Q12H  . carvedilol  12.5 mg Oral BID  . Chlorhexidine Gluconate Cloth  6 each Topical Daily  . folic acid  1 mg Oral Daily  . hydrocortisone   Rectal QID  . hydrocortisone-pramoxine  1 applicator Rectal BID  . metroNIDAZOLE  500 mg Oral TID  . multivitamin with minerals  1 tablet Oral Daily  . pantoprazole  40 mg Oral BID AC  . rifaximin  550 mg Oral BID  . tamsulosin  0.4 mg Oral BID  . thiamine  100 mg Oral Daily   Or  . thiamine  100 mg Intravenous Daily   Continuous Infusions: . dextrose 5 % and 0.45 % NaCl with KCl 20 mEq/L 75 mL/hr at 06/10/19 1003   PRN Meds:.polyethylene glycol, traMADol    Anti-infectives (From admission, onward)   Start     Dose/Rate Route Frequency Ordered Stop   06/09/19 1800  metroNIDAZOLE (FLAGYL) tablet 500 mg     500 mg Oral 3 times daily 06/09/19 1745     06/07/19 2130  rifaximin (XIFAXAN) tablet 550 mg     550 mg Oral 2 times daily 06/07/19 2119     06/06/19 2200  amoxicillin-clavulanate (AUGMENTIN) 875-125 MG per tablet 1 tablet     1 tablet Oral Every 12 hours 06/06/19 1822     06/05/19 0645  metroNIDAZOLE (FLAGYL) IVPB 500 mg     500 mg 100 mL/hr over 60 Minutes Intravenous  Once 06/05/19 0640 06/05/19 0954   06/05/19 0600  ceFEPIme (MAXIPIME) 2 g in sodium chloride 0.9 % 100 mL IVPB  Status:  Discontinued     2 g 200 mL/hr over 30 Minutes Intravenous Every 8 hours 06/05/19 0217 06/05/19 0357   06/05/19 0400  cefTRIAXone (ROCEPHIN) 1 g in sodium chloride 0.9 % 100 mL IVPB  Status:  Discontinued     1 g 200 mL/hr over 30 Minutes Intravenous Every 24 hours 06/05/19 0357 06/06/19 1822   06/04/19 2130  vancomycin (VANCOREADY) IVPB 2000 mg/400 mL     2,000 mg 200 mL/hr over 120 Minutes Intravenous  Once 06/04/19 2130 06/05/19 0216   06/04/19 2115  ceFEPIme (MAXIPIME) 2 g in sodium chloride 0.9 % 100 mL IVPB     2 g 200 mL/hr over 30 Minutes  Intravenous  Once 06/04/19 2105 06/04/19 2226   06/04/19 2115  metroNIDAZOLE (FLAGYL) IVPB 500 mg     500 mg 100 mL/hr over 60 Minutes Intravenous  Once 06/04/19 2105 06/05/19 0028   06/04/19 2115  vancomycin (VANCOCIN) IVPB 1000 mg/200 mL premix  Status:  Discontinued     1,000 mg 200 mL/hr over 60 Minutes Intravenous  Once 06/04/19 2105 06/04/19 2130        Objective:   Vitals:   06/10/19 0748 06/10/19 1437 06/10/19 1519 06/10/19 1744  BP:  130/61 130/65 (!) 145/71  Pulse:  72 73 70  Resp:  16 16 16   Temp:  99.5 F (37.5 C) 98.1 F (36.7 C) 98.5 F (36.9 C)  TempSrc:  Oral Oral Oral  SpO2: 99% 100% 100%   Weight:  Height:        Wt Readings from Last 3 Encounters:  06/08/19 98.7 kg  05/31/18 100 kg  05/17/18 93.5 kg     Intake/Output Summary (Last 24 hours) at 06/10/2019 1901 Last data filed at 06/10/2019 1745 Gross per 24 hour  Intake 844.91 ml  Output --  Net 844.91 ml     Physical Exam  Gen:-coherent, in no acute distress HEENT:- [place].AT, No sclera icterus Neck-Supple Neck,No JVD,.  Lungs-  CTAB , fair symmetrical air movement CV- S1, S2 normal, regular ,  Abd-  +ve B.Sounds, Abd Soft, No tenderness, no CVA area tenderness Extremity/Skin:- No  edema, pedal pulses present  Psych-alert and oriented mostly,  neuro-generalized weakness, no new focal deficits,  GU-Foley in situ   Data Review:   Micro Results Recent Results (from the past 240 hour(s))  Culture, blood (routine x 2)     Status: None   Collection Time: 06/04/19  8:05 PM   Specimen: Left Antecubital; Blood  Result Value Ref Range Status   Specimen Description LEFT ANTECUBITAL  Final   Special Requests   Final    BOTTLES DRAWN AEROBIC AND ANAEROBIC Blood Culture adequate volume   Culture   Final    NO GROWTH 5 DAYS Performed at [facility], [address], [place], [place] [address]    Report Status 06/09/2019 FINAL  Final  Urine culture     Status: None   Collection Time: 06/04/19   8:12 PM   Specimen: Urine, Catheterized  Result Value Ref Range Status   Specimen Description   Final    URINE, CATHETERIZED Performed at [facility], [address], [place], [place] [address]    Special Requests   Final    NONE Performed at [facility], [address], [place], [place] [address]    Culture   Final    NO GROWTH Performed at [facility], [place] [address], [place], [place] [address]    Report Status 06/07/2019 FINAL  Final  Culture, blood (routine x 2)     Status: None   Collection Time: 06/04/19  8:49 PM   Specimen: Right Antecubital; Blood  Result Value Ref Range Status   Specimen Description RIGHT ANTECUBITAL  Final   Special Requests   Final    BOTTLES DRAWN AEROBIC AND ANAEROBIC Blood Culture adequate volume   Culture   Final    NO GROWTH 5 DAYS Performed at [facility], [address] [place], [place] [address]    Report Status 06/09/2019 FINAL  Final  MRSA PCR Screening     Status: None   Collection Time: 06/05/19  6:46 PM   Specimen: Nasopharyngeal  Result Value Ref Range Status   MRSA by PCR NEGATIVE NEGATIVE Final    Comment:        The GeneXpert MRSA Assay (FDA approved for NASAL specimens only), is one component of a comprehensive MRSA colonization surveillance program. It is not intended to diagnose MRSA infection nor to guide or monitor treatment for MRSA infections. Performed at [facility], [address], [place], [place] [address]   Respiratory Panel by RT PCR (Flu A&B, Covid) - Nasopharyngeal Swab     Status: None   Collection Time: 06/06/19  2:51 AM   Specimen: Nasopharyngeal Swab  Result Value Ref Range Status   SARS Coronavirus 2 by RT PCR NEGATIVE NEGATIVE Final    Comment: (NOTE) SARS-CoV-2 target nucleic acids are NOT DETECTED. The SARS-CoV-2 RNA is generally detectable  in upper respiratoy specimens during the acute phase of infection. The lowest concentration of SARS-CoV-2 viral copies this assay can detect is 131  copies/mL. A negative result does not preclude SARS-Cov-2 infection and should not be used as the sole basis for treatment or other patient management decisions. A negative result may occur with  improper specimen collection/handling, submission of specimen other than nasopharyngeal swab, presence of viral mutation(s) within the areas targeted by this assay, and inadequate number of viral copies (<131 copies/mL). A negative result must be combined with clinical observations, patient history, and epidemiological information. The expected result is Negative. Fact Sheet for Patients:  PinkCheek.be Fact Sheet for Healthcare Providers:  GravelBags.it This test is not yet ap proved or cleared by the [place] FDA and  has been authorized for detection and/or diagnosis of SARS-CoV-2 by FDA under an Emergency Use Authorization (EUA). This EUA will remain  in effect (meaning this test can be used) for the duration of the COVID-19 declaration under Section 564(b)(1) of the Act, 21 U.S.C. section 360bbb-3(b)(1), unless the authorization is terminated or revoked sooner.    Influenza A by PCR NEGATIVE NEGATIVE Final   Influenza B by PCR NEGATIVE NEGATIVE Final    Comment: (NOTE) The Xpert Xpress SARS-CoV-2/FLU/RSV assay is intended as an aid in  the diagnosis of influenza from Nasopharyngeal swab specimens and  should not be used as a sole basis for treatment. Nasal washings and  aspirates are unacceptable for Xpert Xpress SARS-CoV-2/FLU/RSV  testing. Fact Sheet for Patients: PinkCheek.be Fact Sheet for Healthcare Providers: GravelBags.it This test is not yet approved or cleared by the [place] FDA and  has been authorized for detection and/or diagnosis of SARS-CoV-2 by  FDA under an Emergency Use Authorization (EUA). This EUA will remain  in effect (meaning this test can  be used) for the duration of the  Covid-19 declaration under Section 564(b)(1) of the Act, 21  U.S.C. section 360bbb-3(b)(1), unless the authorization is  terminated or revoked. Performed at [facility], [address] [place], [place] [address]   C Difficile Quick Screen (NO PCR Reflex)     Status: None   Collection Time: 06/09/19  2:13 PM   Specimen: Stool  Result Value Ref Range Status   C Diff antigen NEGATIVE NEGATIVE Final   C Diff toxin NEGATIVE NEGATIVE Final   C Diff interpretation No C. difficile detected.  Final    Comment: Performed at [facility], [address] [place], [place] [address]    Radiology Reports CT Abdomen Pelvis W Contrast  Result Date: 06/04/2019 CLINICAL DATA:  69 year old male with abdominal distension and diarrhea. Concern for acute diverticulitis. EXAM: CT ABDOMEN AND PELVIS WITH CONTRAST TECHNIQUE: Multidetector CT imaging of the abdomen and pelvis was performed using the standard protocol following bolus administration of intravenous contrast. CONTRAST:  173mL OMNIPAQUE IOHEXOL 300 MG/ML  SOLN COMPARISON:  None. FINDINGS: Lower chest: The visualized lung bases are clear. No intra-abdominal free air or free fluid. Hepatobiliary: Severe fatty infiltration of the liver. No intrahepatic biliary ductal dilatation. The gallbladder is unremarkable. Pancreas: Atrophic pancreas. No Accu to inflammatory changes. No dilatation of the main pancreatic duct. Spleen: Normal in size without focal abnormality. Adrenals/Urinary Tract: The adrenal glands are unremarkable. There is mild bilateral hydronephrosis, right greater left. There is enhancement of the right renal urothelium concerning for pyelonephritis. Correlation with urinalysis recommended. There is slight delayed enhancement of the right renal parenchyma. Multiple right renal cysts measure up to 4 cm in  the interpolar aspect of the right kidney. Additional subcentimeter right renal hypodense lesions are too small to  characterize. The urinary bladder is distended. Stomach/Bowel: There is narrowing of a segment of the rectosigmoid, likely related to mass effect caused by distended urinary bladder. There is mild thickened appearance of the segment of colon with surrounding stranding. The perisigmoid stranding may be related to UTI but concerning for stercoral colitis. Clinical correlation is recommended. There is probable mild associate luminal narrowing of the sigmoid colon. No evidence of small-bowel obstruction. The appendix is not visualized with certainty. No inflammatory changes identified in the right lower quadrant. Vascular/Lymphatic: The abdominal aorta and IVC unremarkable. No portal venous gas. There is no adenopathy. Reproductive: The prostate and seminal vesicles are grossly unremarkable. Partially visualized fluid within the left inguinal canal likely related to hydrocele. Other: Mild diffuse subcutaneous edema. Musculoskeletal: Degenerative changes of the spine. No acute osseous pathology. IMPRESSION: 1. Distended urinary bladder with mild bilateral hydronephrosis and findings of right-sided pyelonephritis. Correlation with urinalysis recommended. 2. Thickened appearance of the rectosigmoid with surrounding stranding which may be related to UTI but concerning for stercoral colitis. Clinical correlation is recommended. 3. Severe fatty liver. Electronically Signed   By: Anner Crete M.D.   On: 06/04/2019 23:41   US Venous Img Lower Bilateral (DVT)  Result Date: 06/05/2019 CLINICAL DATA:  Bilateral lower extremity pain and edema for 1 month. EXAM: BILATERAL LOWER EXTREMITY VENOUS DOPPLER ULTRASOUND TECHNIQUE: Gray-scale sonography with compression, as well as color and duplex ultrasound, were performed to evaluate the deep venous system(s) from the level of the common femoral vein through the popliteal and proximal calf veins. COMPARISON:  None. FINDINGS: VENOUS Normal compressibility of the common femoral,  superficial femoral, and popliteal veins, as well as the visualized calf veins. Visualized portions of profunda femoral vein and great saphenous vein unremarkable. No filling defects to suggest DVT on grayscale or color Doppler imaging. Doppler waveforms show normal direction of venous flow, normal respiratory phasicity and response to augmentation. OTHER None. Limitations: none IMPRESSION: No femoropopliteal DVT nor evidence of DVT within the visualized calf veins. If clinical symptoms are inconsistent or if there are persistent or worsening symptoms, further imaging (possibly involving the iliac veins) may be warranted. Electronically Signed   By: Abigail Miyamoto M.D.   On: 06/05/2019 11:44   DG Chest Port 1 View  Result Date: 06/04/2019 CLINICAL DATA:  Tachycardia, bilateral lower extremity edema EXAM: PORTABLE CHEST 1 VIEW COMPARISON:  05/13/2018 FINDINGS: The heart size and mediastinal contours are within normal limits. Both lungs are clear. The visualized skeletal structures are unremarkable. IMPRESSION: No active disease. Electronically Signed   By: Randa Ngo M.D.   On: 06/04/2019 20:32   ECHOCARDIOGRAM COMPLETE  Result Date: 06/05/2019    ECHOCARDIOGRAM REPORT   Patient Name:   KIMSEY DEMAREE Date of Exam: 06/05/2019 Medical Rec #:  [id number]     Height:       73.0 in Accession #:    [id number]    Weight:       205.0 lb Date of Birth:  1950-10-05    BSA:          2.174 m Patient Age:    75 years      BP:           152/86 mmHg Patient Gender: M             HR:           90 bpm. Exam  Location:  [facility] Procedure: 2D Echo, Cardiac Doppler and Color Doppler Indications:    Lower extremity edema [218969]                 Alcohol abuse [193790]  History:        Patient has no prior history of Echocardiogram examinations.                 Risk Factors:Hypertension, Diabetes, Current Smoker and                 Dyslipidemia.  Sonographer:    Alvino Chapel RCS Referring Phys: [place]   1. Left ventricular ejection fraction, by estimation, is 60 to 65%. The left ventricle has normal function. The left ventricle has no regional wall motion abnormalities. There is moderate left ventricular hypertrophy. Left ventricular diastolic parameters were normal.  2. Right ventricular systolic function is normal. The right ventricular size is normal.  3. Left atrial size was mildly dilated.  4. The mitral valve is normal in structure. No evidence of mitral valve regurgitation.  5. The aortic valve is tricuspid. Aortic valve regurgitation is not visualized. No aortic stenosis is present. FINDINGS  Left Ventricle: Left ventricular ejection fraction, by estimation, is 60 to 65%. The left ventricle has normal function. The left ventricle has no regional wall motion abnormalities. The left ventricular internal cavity size was normal in size. There is  moderate left ventricular hypertrophy. Left ventricular diastolic parameters were normal. Right Ventricle: The right ventricular size is normal. Right vetricular wall thickness was not assessed. Right ventricular systolic function is normal. There is mildly elevated pulmonary artery systolic pressure. The tricuspid regurgitant velocity is 2.55 m/s, and with an assumed right atrial pressure of 8 mmHg, the estimated right ventricular systolic pressure is 24.0 mmHg. Left Atrium: Left atrial size was mildly dilated. Right Atrium: Right atrial size was normal in size. Pericardium: Trivial pericardial effusion is present. Presence of pericardial fat pad. Mitral Valve: The mitral valve is normal in structure. No evidence of mitral valve regurgitation. Tricuspid Valve: The tricuspid valve is normal in structure. Tricuspid valve regurgitation is trivial. Aortic Valve: The aortic valve is tricuspid. Aortic valve regurgitation is not visualized. No aortic stenosis is present. Pulmonic Valve: The pulmonic valve was not well visualized. Pulmonic valve regurgitation is not  visualized. Aorta: The aortic root is normal in size and structure. IAS/Shunts: The interatrial septum was not well visualized.  LEFT VENTRICLE PLAX 2D LVIDd:         4.85 cm  Diastology LVIDs:         3.13 cm  LV e' lateral:   10.80 cm/s LV PW:         1.35 cm  LV E/e' lateral: 9.4 LV IVS:        1.41 cm  LV e' medial:    9.03 cm/s LVOT diam:     2.00 cm  LV E/e' medial:  11.2 LV SV:         76 LV SV Index:   35 LVOT Area:     3.14 cm  RIGHT VENTRICLE RV Mid diam:    3.04 cm RV S prime:     19.60 cm/s TAPSE (M-mode): 2.1 cm LEFT ATRIUM             Index LA diam:        4.10 cm 1.89 cm/m LA Vol (A2C):   95.3 ml 43.83 ml/m LA Vol (A4C):   66.2  ml 30.45 ml/m LA Biplane Vol: 80.8 ml 37.16 ml/m  AORTIC VALVE LVOT Vmax:   112.00 cm/s LVOT Vmean:  79.100 cm/s LVOT VTI:    0.241 m  AORTA Ao Root diam: 3.10 cm MITRAL VALVE                TRICUSPID VALVE MV Area (PHT): 3.07 cm     TR Peak grad:   26.0 mmHg MV Decel Time: 247 msec     TR Vmax:        255.00 cm/s MV E velocity: 101.00 cm/s MV A velocity: 95.10 cm/s   SHUNTS MV E/A ratio:  1.06         Systemic VTI:  0.24 m                             Systemic Diam: 2.00 cm Oswaldo Milian MD Electronically signed by Oswaldo Milian MD Signature Date/Time: 06/05/2019/10:46:22 PM    Final      CBC Recent Labs  Lab 06/04/19 2005 06/05/19 0421 06/06/19 0420 06/07/19 0425 06/08/19 0354 06/09/19 0518 06/10/19 0606  WBC 9.7   < > 9.4 8.4 7.3 7.1 7.8  HGB 7.6*   < > 6.5* 7.6* 7.2* 7.1* 6.9*  HCT 23.1*   < > 20.3* 23.5* 22.2* 22.1* 21.4*  PLT 387   < > 310 317 287 299 282  MCV 93.9   < > 94.9 92.9 93.7 94.0 94.7  MCH 30.9   < > 30.4 30.0 30.4 30.2 30.5  MCHC 32.9   < > 32.0 32.3 32.4 32.1 32.2  RDW 18.6*   < > 19.0* 18.3* 18.2* 17.9* 17.9*  LYMPHSABS 1.2  --   --   --   --   --   --   MONOABS 0.5  --   --   --   --   --   --   EOSABS 0.0  --   --   --   --   --   --   BASOSABS 0.0  --   --   --   --   --   --    < > = values in this interval not  displayed.    Chemistries  Recent Labs  Lab 06/04/19 2005 06/04/19 2005 06/05/19 0421 06/05/19 1540 06/06/19 0420 06/07/19 0425 06/08/19 0354 06/09/19 0518 06/10/19 0606  NA 139   < > 141   < > 145 148* 145 144 142  K 2.4*   < > 2.7*   < > 2.7* 3.1* 2.7* 2.6* 2.5*  CL 114*   < > 116*   < > 120* 119* 118* 118* 119*  CO2 15*   < > 15*   < > 18* 19* 20* 18* 18*  GLUCOSE 124*   < > 123*   < > 108* 114* 115* 104* 111*  BUN 29*   < > 26*   < > 22 17 14 12 12   CREATININE 1.38*   < > 1.28*   < > 0.99 1.07 0.96 0.93 1.05  CALCIUM 6.4*   < > 6.5*   < > 6.9* 7.1* 7.0* 7.1* 7.0*  MG  --   --  1.3*  --  1.9  --  1.7 1.8 1.6*  AST 43*  --  47*  --  33  --  37 42*  --   ALT 32  --  32  --  27  --  23 24  --   ALKPHOS 115  --  114  --  89  --  90 92  --   BILITOT 1.0  --  1.0  --  0.7  --  0.5 0.5  --    < > = values in this interval not displayed.   ------------------------------------------------------------------------------------------------------------------ No results for input(s): CHOL, HDL, LDLCALC, TRIG, CHOLHDL, LDLDIRECT in the last 72 hours.  Lab Results  Component Value Date   HGBA1C 6.9 (H) 05/13/2018   ------------------------------------------------------------------------------------------------------------------ No results for input(s): TSH, T4TOTAL, T3FREE, THYROIDAB in the last 72 hours.  Invalid input(s): FREET3 ------------------------------------------------------------------------------------------------------------------ No results for input(s): VITAMINB12, FOLATE, FERRITIN, TIBC, IRON, RETICCTPCT in the last 72 hours.  Coagulation profile Recent Labs  Lab 06/04/19 2005  INR 1.2    No results for input(s): DDIMER in the last 72 hours.  Cardiac Enzymes No results for input(s): CKMB, TROPONINI, MYOGLOBIN in the last 168 hours.  Invalid input(s):  CK ------------------------------------------------------------------------------------------------------------------    Component Value Date/Time   BNP 238.0 (H) 06/05/2019 1470     Roxan Hockey M.D on 06/10/2019 at 7:01 PM  Go to [email or web address] - for contact info  [facility] - Office  [phone]

## 2019-06-10 NOTE — Progress Notes (Addendum)
Subjective: Doing better, feels pretty good. Fewer stools today (only 2). Denies abdominal pain, N/V. States his stomach "just occasionally feels 'not right' but not hurting." Denies worsening weakness, fatigue, dyspnea. No hematochezia or melena. No other GI complaints. Bladder soreness improved.  Objective: Vital signs in last 24 hours: Temp:  [99.3 F (37.4 C)-99.4 F (37.4 C)] 99.3 F (37.4 C) (04/23 0510) Pulse Rate:  [74-77] 77 (04/23 0510) Resp:  [16] 16 (04/23 0510) BP: (118-138)/(65-79) 138/65 (04/23 0510) SpO2:  [99 %-100 %] 99 % (04/23 0748) Last BM Date: 06/09/19 General:   Alert and oriented, pleasant Head:  Normocephalic and atraumatic. Eyes:  No icterus, sclera clear. Conjuctiva pink.  Heart:  S1, S2 present, no murmurs noted.  Lungs: Clear to auscultation bilaterally, without wheezing, rales, or rhonchi.  Abdomen:  Bowel sounds present, soft, non-tender, non-distended. No HSM or hernias noted. No rebound or guarding. No masses appreciated  Msk:  Symmetrical without gross deformities. Extremities:  Without clubbing or edema. Neurologic:  Alert and  oriented x4;  grossly normal neurologically. Skin:  Warm and dry, intact without significant lesions.  Psych:  Alert and cooperative. Normal mood and affect.  Intake/Output from previous day: 04/22 0701 - 04/23 0700 In: 526.9 [I.V.:526.9] Out: 525 [Urine:525] Intake/Output this shift: No intake/output data recorded.  Lab Results: Recent Labs    06/08/19 0354 06/09/19 0518 06/10/19 0606  WBC 7.3 7.1 7.8  HGB 7.2* 7.1* 6.9*  HCT 22.2* 22.1* 21.4*  PLT 287 299 282   BMET Recent Labs    06/08/19 0354 06/09/19 0518 06/10/19 0606  NA 145 144 142  K 2.7* 2.6* 2.5*  CL 118* 118* 119*  CO2 20* 18* 18*  GLUCOSE 115* 104* 111*  BUN 14 12 12   CREATININE 0.96 0.93 1.05  CALCIUM 7.0* 7.1* 7.0*   LFT Recent Labs    06/08/19 0354 06/09/19 0518  PROT 4.8* 4.6*  ALBUMIN 2.0* 1.9*  AST 37 42*  ALT 23 24   ALKPHOS 90 92  BILITOT 0.5 0.5   PT/INR No results for input(s): LABPROT, INR in the last 72 hours. Hepatitis Panel No results for input(s): HEPBSAG, HCVAB, HEPAIGM, HEPBIGM in the last 72 hours.   Studies/Results: No results found.  Assessment: 69 year old male with history of alcohol abuse, chronic anemia with blood transfusions on several prior occasions during hospitalization last year, admitted with right-sided pyelonephritis and possible stercoral colitis on CT.  Acute drop in hemoglobin from baseline to 6.5, receiving 1 unit of packed red blood cells with improvement of hemoglobin to 7.6.  His baseline has been in the 7/8 range.  No overt GI bleeding but he is heme positive.  He has had some alcohol withdrawals this admission.  Acute on chronic anemia- Stool brown, heme positive. Hemoglobin stable but somewhat downward drift (with minimal reserve) from 7.6 -> 7.2 -> 7.1 -> 6.9 today. EGD last year with erosive gastropathy, duodenal erosions, likely due to NSAIDs. Colonoscopy with inadequate prep 05/2018 and large polyp piecemeal removed, overdue for early interval surveillance.  Plans for outpatient colonoscopy.  ?Stercoral colitis- CT showed narrowing of segment of rectosigmoid, likely related to mass effect caused by distended urinary bladder. There is mild thickened appearance of the rectosigmoid with surrounding stranding may be related to UTI but concerning for stercoral colitis. Possible associated luminal narrowing of sigmoid colon (mild). Incomplete DRE due to pain from prolapsing grade 3 hemorrhoids. Multiple soft/loose stools this admission.GI PA personally reviewed CT imaging with the radiologist yesterday.  Stool noted throughout colon/rectum but did not have appearance of impaction and likely had some retention of stool due to compression by large urinary bladder. No masses seen. Patient now has foley catheter.  Urinary retention- patient noted to have significantly  distended urinary bladder on admission by CT. Noted only 300cc of urine in the last 24 hours. Bladder scan >500cc yesterday. Per documentation by nursing staff, attending to order Flomax. Patient has been on flomax since admission. Recommended consider urology consult.  Diarrhea- yesterday with multiple loose/[place] 7 stools. CDiff quick scan negative. GI Path panel pending. Fewer stools today. Feeling better. Thinks he was just really backed up and finally emptied.  Plan: 1. Follow GI path panel 2. Monitor hgb 3. Monitor for acute GI bleed 4. Transfuse as necessary 5. Consider IP vs OP urology consult 6. Supportive measures   Thank you for allowing [place] to participate in the care of Darren Howard, [place], AGNP-C Adult & Gerontological Nurse Practitioner [facility]  Attending note: Late entry.  GIP panel negative   LOS: 5 days    06/10/2019, 10:52 AM

## 2019-06-10 NOTE — Progress Notes (Signed)
Has had at least 5 loose stools today. Yellow diarrhea.  Given one time dose just now of immodium.  Blood infusing with no adverse reactions.

## 2019-06-11 ENCOUNTER — Inpatient Hospital Stay (HOSPITAL_COMMUNITY): Payer: PPO

## 2019-06-11 LAB — CBC
HCT: 25.4 % — ABNORMAL LOW (ref 39.0–52.0)
Hemoglobin: 8.2 g/dL — ABNORMAL LOW (ref 13.0–17.0)
MCH: 29.5 pg (ref 26.0–34.0)
MCHC: 32.3 g/dL (ref 30.0–36.0)
MCV: 91.4 fL (ref 80.0–100.0)
Platelets: 305 10*3/uL (ref 150–400)
RBC: 2.78 MIL/uL — ABNORMAL LOW (ref 4.22–5.81)
RDW: 17.6 % — ABNORMAL HIGH (ref 11.5–15.5)
WBC: 11.2 10*3/uL — ABNORMAL HIGH (ref 4.0–10.5)
nRBC: 0 % (ref 0.0–0.2)

## 2019-06-11 LAB — RENAL FUNCTION PANEL
Albumin: 2 g/dL — ABNORMAL LOW (ref 3.5–5.0)
Anion gap: 7 (ref 5–15)
BUN: 9 mg/dL (ref 8–23)
CO2: 17 mmol/L — ABNORMAL LOW (ref 22–32)
Calcium: 7.3 mg/dL — ABNORMAL LOW (ref 8.9–10.3)
Chloride: 118 mmol/L — ABNORMAL HIGH (ref 98–111)
Creatinine, Ser: 1.04 mg/dL (ref 0.61–1.24)
GFR calc Af Amer: >60 mL/min (ref >60)
GFR calc non Af Amer: >60 mL/min (ref >60)
Glucose, Bld: 146 mg/dL — ABNORMAL HIGH (ref 70–99)
Phosphorus: 2.3 mg/dL — ABNORMAL LOW (ref 2.5–4.6)
Potassium: 2.3 mmol/L — CL (ref 3.5–5.1)
Sodium: 142 mmol/L (ref 135–145)

## 2019-06-11 LAB — BPAM RBC
Blood Product Expiration Date: 202105042359
ISSUE DATE / TIME: 202104231458
Unit Type and Rh: 1700

## 2019-06-11 LAB — TYPE AND SCREEN
ABO/RH(D): B POS
Antibody Screen: NEGATIVE
Unit division: 0

## 2019-06-11 MED ORDER — POTASSIUM CHLORIDE 10 MEQ/100ML IV SOLN
10.0000 meq | INTRAVENOUS | Status: AC
  Administered 2019-06-11 (×4): 10 meq via INTRAVENOUS
  Filled 2019-06-11 (×4): qty 100

## 2019-06-11 MED ORDER — LINACLOTIDE 145 MCG PO CAPS
145.0000 ug | ORAL_CAPSULE | Freq: Every day | ORAL | Status: DC
  Administered 2019-06-11 – 2019-06-13 (×3): 145 ug via ORAL
  Filled 2019-06-11 (×3): qty 1

## 2019-06-11 MED ORDER — POTASSIUM CHLORIDE CRYS ER 20 MEQ PO TBCR
40.0000 meq | EXTENDED_RELEASE_TABLET | ORAL | Status: AC
  Administered 2019-06-11 (×2): 40 meq via ORAL
  Filled 2019-06-11 (×2): qty 2

## 2019-06-11 MED ORDER — DIPHENOXYLATE-ATROPINE 2.5-0.025 MG PO TABS
2.0000 | ORAL_TABLET | Freq: Once | ORAL | Status: AC
  Administered 2019-06-11: 2 via ORAL
  Filled 2019-06-11: qty 2

## 2019-06-11 MED ORDER — LOPERAMIDE HCL 2 MG PO CAPS
2.0000 mg | ORAL_CAPSULE | Freq: Once | ORAL | Status: AC
  Administered 2019-06-11: 2 mg via ORAL
  Filled 2019-06-11: qty 1

## 2019-06-11 NOTE — Progress Notes (Signed)
Critical lab **  K+ 2.3 Provider,  Dr. Joesph Fillers notified.  Elodia Florence RN 06/11/2019 @ [phone]

## 2019-06-11 NOTE — Progress Notes (Addendum)
Patient Demographics:    Darren Howard, is a 69 y.o. male, DOB - 12/05/1950, BSW:[id number]  Admit date - 06/04/2019   Admitting Physician Kathie Dike, MD  Outpatient Primary MD for the patient is [name], MD  LOS - 6  Chief Complaint  Patient presents with  . Leg Swelling        Subjective:    Darren Howard today has no fevers, no emesis,  No chest pain,  -Continues to have numerous/frequent mucousy stool -Hypokalemia noted-down to 2.3 despite replacement due to GI losses  Assessment  & Plan :    Principal Problem:   Pyelonephritis of right kidney Active Problems:   Hypertension   Normocytic anemia   DM2 (diabetes mellitus, type 2) (HCC)   Hypokalemia   Hypocalcemia   Alcohol abuse   Tobacco use   Pyelonephritis   Abnormal CT scan, colon   Diarrhea   Urinary retention  Brief Summary:- 69 y.o. male with medical history significant of alcohol abuse, chronic normocytic anemia, medication and treatment noncompliance, history of duodenal reduction, erosive gastropathy, GI bleed, glucose intolerance/type 2 diabetes, hyperlipidemia, hypertension admitted on 06/05/2019 with possible right-sided pyelonephritis, as well as stercoral colitis and found to have alcohol withdrawal symptoms -Now with frequent mucousy stools recurrent hypokalemia and hypomagnesemia  A/p 1)Stercoral Colitis--- patient previously received Vancomycin, Rocephin and Cefepime  -Okay to stop Augmentin on 06/12/2019 --Continues to have frequent mucousy stool--C. difficile test and GI pathogen negative, okay to use Imodium as needed -Abdominal x-rays with nonspecific ileus type findings Potassium and magnesium mildly low due to frequent BMs/GI losses---replacing electrolytes --At  risk for dehydration an severe electrolyte abnormalities given frequent stools- requires IV dextrose fluids -GI input/consult  appreciated -Continue Anusol AC for hemorrhoids  2)Alcohol Abuse with DTs--- DT  resolved,  --continue folic acid, thiamine/multivitamin  -Patient is coherent  3)DM2--last A1c 6.9 reflecting excellent DM control PTA -Metformin on hold Use Novolog/Humalog Sliding scale insulin with Accu-Cheks/Fingersticks as ordered   4) hypocalcemia/hypokalemia/hypomagnesemia and hypoalbuminemia-----due to GI losses the patient with frequent stools ---At  risk for dehydration and severe electrolyte abnormalities given frequent stools- requires IV dextrose fluids  5)HTN--stable, continue Coreg at reduced dose of 12.5 mg twice daily, continue to hold amlodipine  6) suspected right-sided pyelonephritis--- urine and blood cultures negative, antibiotics as above #1 for stercoral colitis rather than pyelonephritis  7)Acute symptomatic blood loss anemia superimposed on chronic blood loss anemia ----admission hemoglobin was  6.5 , initially received 1 unit of blood on 06/06/2019, -no significant tachycardia at this time most likely due to Coreg use Hemoglobin up to 8.2 after another 1 unit of packed cells on 06/10/2019 --Stool occult blood is positive, no overt GI bleed at this time -GI consult appreciated recommends possible outpatient colonoscopy given poor prep with last colonoscopy and polyp removal at the time  8)Urinary retention-- -Unable to void after removal of Foley catheter --Bladder scan over 500 Foley reinserted on 06/09/2019 -Flomax as ordered  Disposition/Need for in-Hospital Stay- patient unable to be discharged at this time due to --- frequent stools leading to severe electrolyte abnormalities and risk for dehydration requiring IV fluids and IV replacements of magnesium and potassium -Patient From: home  D/C Place: home  Barriers: Not Clinically Stable- ---  persistent diarrhea urinary retention --- high risk for dehydration and electrolyte abnormalities given frequency of stools--- potassium is  down to 2.3 despite IV and oral replacement  Code Status : full  Family Communication:   -Discussed with son [name]   Consults  : GI   DVT Prophylaxis  :   - SCDs   Lab Results  Component Value Date   PLT 305 06/11/2019    Inpatient Medications  Scheduled Meds: . amoxicillin-clavulanate  1 tablet Oral Q12H  . carvedilol  12.5 mg Oral BID  . Chlorhexidine Gluconate Cloth  6 each Topical Daily  . folic acid  1 mg Oral Daily  . hydrocortisone   Rectal QID  . hydrocortisone-pramoxine  1 applicator Rectal BID  . linaclotide  145 mcg Oral QAC breakfast  . metroNIDAZOLE  500 mg Oral TID  . multivitamin with minerals  1 tablet Oral Daily  . pantoprazole  40 mg Oral BID AC  . rifaximin  550 mg Oral BID  . tamsulosin  0.4 mg Oral BID  . thiamine  100 mg Oral Daily   Or  . thiamine  100 mg Intravenous Daily   Continuous Infusions: . dextrose 5 % and 0.45 % NaCl with KCl 20 mEq/L 75 mL/hr at 06/11/19 0425  . potassium chloride 10 mEq (06/11/19 1627)   PRN Meds:.traMADol    Anti-infectives (From admission, onward)   Start     Dose/Rate Route Frequency Ordered Stop   06/09/19 1800  metroNIDAZOLE (FLAGYL) tablet 500 mg     500 mg Oral 3 times daily 06/09/19 1745     06/07/19 2130  rifaximin (XIFAXAN) tablet 550 mg     550 mg Oral 2 times daily 06/07/19 2119     06/06/19 2200  amoxicillin-clavulanate (AUGMENTIN) 875-125 MG per tablet 1 tablet     1 tablet Oral Every 12 hours 06/06/19 1822     06/05/19 0645  metroNIDAZOLE (FLAGYL) IVPB 500 mg     500 mg 100 mL/hr over 60 Minutes Intravenous  Once 06/05/19 0640 06/05/19 0954   06/05/19 0600  ceFEPIme (MAXIPIME) 2 g in sodium chloride 0.9 % 100 mL IVPB  Status:  Discontinued     2 g 200 mL/hr over 30 Minutes Intravenous Every 8 hours 06/05/19 0217 06/05/19 0357   06/05/19 0400  cefTRIAXone (ROCEPHIN) 1 g in sodium chloride 0.9 % 100 mL IVPB  Status:  Discontinued     1 g 200 mL/hr over 30 Minutes Intravenous Every 24 hours  06/05/19 0357 06/06/19 1822   06/04/19 2130  vancomycin (VANCOREADY) IVPB 2000 mg/400 mL     2,000 mg 200 mL/hr over 120 Minutes Intravenous  Once 06/04/19 2130 06/05/19 0216   06/04/19 2115  ceFEPIme (MAXIPIME) 2 g in sodium chloride 0.9 % 100 mL IVPB     2 g 200 mL/hr over 30 Minutes Intravenous  Once 06/04/19 2105 06/04/19 2226   06/04/19 2115  metroNIDAZOLE (FLAGYL) IVPB 500 mg     500 mg 100 mL/hr over 60 Minutes Intravenous  Once 06/04/19 2105 06/05/19 0028   06/04/19 2115  vancomycin (VANCOCIN) IVPB 1000 mg/200 mL premix  Status:  Discontinued     1,000 mg 200 mL/hr over 60 Minutes Intravenous  Once 06/04/19 2105 06/04/19 2130        Objective:   Vitals:   06/10/19 1519 06/10/19 1744 06/10/19 2106 06/11/19 0548  BP: 130/65 (!) 145/71 (!) 149/71 (!) 159/69  Pulse: 73 70 73 85  Resp: 16 16  16 16  Temp: 98.1 F (36.7 C) 98.5 F (36.9 C) 98 F (36.7 C) 98.9 F (37.2 C)  TempSrc: Oral Oral Oral Oral  SpO2: 100%  100% 100%  Weight:      Height:        Wt Readings from Last 3 Encounters:  06/08/19 98.7 kg  05/31/18 100 kg  05/17/18 93.5 kg     Intake/Output Summary (Last 24 hours) at 06/11/2019 1651 Last data filed at 06/11/2019 1243 Gross per 24 hour  Intake 2383.72 ml  Output 1700 ml  Net 683.72 ml   Physical Exam  Gen:-coherent, in no acute distress HEENT:- [place].AT, No sclera icterus Neck-Supple Neck,No JVD,.  Lungs-  CTAB , fair symmetrical air movement CV- S1, S2 normal, regular ,  Abd-  +ve B.Sounds, Abd Soft, No tenderness, no CVA area tenderness Extremity/Skin:- No  edema, pedal pulses present  Psych-alert and oriented mostly,  neuro-generalized weakness, no new focal deficits,  GU-Foley in situ   Data Review:   Micro Results Recent Results (from the past 240 hour(s))  Culture, blood (routine x 2)     Status: None   Collection Time: 06/04/19  8:05 PM   Specimen: Left Antecubital; Blood  Result Value Ref Range Status   Specimen Description LEFT  ANTECUBITAL  Final   Special Requests   Final    BOTTLES DRAWN AEROBIC AND ANAEROBIC Blood Culture adequate volume   Culture   Final    NO GROWTH 5 DAYS Performed at [facility], [address] [place], [place] [address]    Report Status 06/09/2019 FINAL  Final  Urine culture     Status: None   Collection Time: 06/04/19  8:12 PM   Specimen: Urine, Catheterized  Result Value Ref Range Status   Specimen Description   Final    URINE, CATHETERIZED Performed at [facility], [address], [place], [place] [address]    Special Requests   Final    NONE Performed at [facility], [address], [place], [place] [address]    Culture   Final    NO GROWTH Performed at [facility], [place] [address], [place], [place] [address]    Report Status 06/07/2019 FINAL  Final  Culture, blood (routine x 2)     Status: None   Collection Time: 06/04/19  8:49 PM   Specimen: Right Antecubital; Blood  Result Value Ref Range Status   Specimen Description RIGHT ANTECUBITAL  Final   Special Requests   Final    BOTTLES DRAWN AEROBIC AND ANAEROBIC Blood Culture adequate volume   Culture   Final    NO GROWTH 5 DAYS Performed at [facility], [address] [place], [place] [address]    Report Status 06/09/2019 FINAL  Final  MRSA PCR Screening     Status: None   Collection Time: 06/05/19  6:46 PM   Specimen: Nasopharyngeal  Result Value Ref Range Status   MRSA by PCR NEGATIVE NEGATIVE Final    Comment:        The GeneXpert MRSA Assay (FDA approved for NASAL specimens only), is one component of a comprehensive MRSA colonization surveillance program. It is not intended to diagnose MRSA infection nor to guide or monitor treatment for MRSA infections. Performed at [facility], [address] [place], [place] [address]   Respiratory Panel by RT PCR (Flu A&B, Covid) - Nasopharyngeal Swab     Status: None   Collection Time: 06/06/19  2:51 AM  Specimen: Nasopharyngeal Swab  Result Value Ref  Range Status   SARS Coronavirus 2 by RT PCR NEGATIVE NEGATIVE Final    Comment: (NOTE) SARS-CoV-2 target nucleic acids are NOT DETECTED. The SARS-CoV-2 RNA is generally detectable in upper respiratoy specimens during the acute phase of infection. The lowest concentration of SARS-CoV-2 viral copies this assay can detect is 131 copies/mL. A negative result does not preclude SARS-Cov-2 infection and should not be used as the sole basis for treatment or other patient management decisions. A negative result may occur with  improper specimen collection/handling, submission of specimen other than nasopharyngeal swab, presence of viral mutation(s) within the areas targeted by this assay, and inadequate number of viral copies (<131 copies/mL). A negative result must be combined with clinical observations, patient history, and epidemiological information. The expected result is Negative. Fact Sheet for Patients:  PinkCheek.be Fact Sheet for Healthcare Providers:  GravelBags.it This test is not yet ap proved or cleared by the [place] FDA and  has been authorized for detection and/or diagnosis of SARS-CoV-2 by FDA under an Emergency Use Authorization (EUA). This EUA will remain  in effect (meaning this test can be used) for the duration of the COVID-19 declaration under Section 564(b)(1) of the Act, 21 U.S.C. section 360bbb-3(b)(1), unless the authorization is terminated or revoked sooner.    Influenza A by PCR NEGATIVE NEGATIVE Final   Influenza B by PCR NEGATIVE NEGATIVE Final    Comment: (NOTE) The Xpert Xpress SARS-CoV-2/FLU/RSV assay is intended as an aid in  the diagnosis of influenza from Nasopharyngeal swab specimens and  should not be used as a sole basis for treatment. Nasal washings and  aspirates are unacceptable for Xpert Xpress SARS-CoV-2/FLU/RSV  testing. Fact Sheet for  Patients: PinkCheek.be Fact Sheet for Healthcare Providers: GravelBags.it This test is not yet approved or cleared by the [place] FDA and  has been authorized for detection and/or diagnosis of SARS-CoV-2 by  FDA under an Emergency Use Authorization (EUA). This EUA will remain  in effect (meaning this test can be used) for the duration of the  Covid-19 declaration under Section 564(b)(1) of the Act, 21  U.S.C. section 360bbb-3(b)(1), unless the authorization is  terminated or revoked. Performed at [facility], [address] [place], [place] [address]   Gastrointestinal Panel by PCR , Stool     Status: None   Collection Time: 06/09/19  2:12 PM   Specimen: Stool  Result Value Ref Range Status   Campylobacter species NOT DETECTED NOT DETECTED Final   Plesimonas shigelloides NOT DETECTED NOT DETECTED Final   Salmonella species NOT DETECTED NOT DETECTED Final   Yersinia enterocolitica NOT DETECTED NOT DETECTED Final   Vibrio species NOT DETECTED NOT DETECTED Final   Vibrio cholerae NOT DETECTED NOT DETECTED Final   Enteroaggregative E coli (EAEC) NOT DETECTED NOT DETECTED Final   Enteropathogenic E coli (EPEC) NOT DETECTED NOT DETECTED Final   Enterotoxigenic E coli (ETEC) NOT DETECTED NOT DETECTED Final   Shiga like toxin producing E coli (STEC) NOT DETECTED NOT DETECTED Final   Shigella/Enteroinvasive E coli (EIEC) NOT DETECTED NOT DETECTED Final   Cryptosporidium NOT DETECTED NOT DETECTED Final   Cyclospora cayetanensis NOT DETECTED NOT DETECTED Final   Entamoeba histolytica NOT DETECTED NOT DETECTED Final   Giardia lamblia NOT DETECTED NOT DETECTED Final   Adenovirus F40/41 NOT DETECTED NOT DETECTED Final   Astrovirus NOT DETECTED NOT DETECTED Final   Norovirus GI/GII NOT DETECTED NOT DETECTED Final   Rotavirus A  NOT DETECTED NOT DETECTED Final   Sapovirus (I, II, IV, and V) NOT DETECTED NOT DETECTED Final    Comment:  Performed at [facility], [place], [place] [address]  C Difficile Quick Screen (NO PCR Reflex)     Status: None   Collection Time: 06/09/19  2:13 PM   Specimen: Stool  Result Value Ref Range Status   C Diff antigen NEGATIVE NEGATIVE Final   C Diff toxin NEGATIVE NEGATIVE Final   C Diff interpretation No C. difficile detected.  Final    Comment: Performed at [facility], [address] [place], [place] [address]    Radiology Reports CT Abdomen Pelvis W Contrast  Result Date: 06/04/2019 CLINICAL DATA:  69 year old male with abdominal distension and diarrhea. Concern for acute diverticulitis. EXAM: CT ABDOMEN AND PELVIS WITH CONTRAST TECHNIQUE: Multidetector CT imaging of the abdomen and pelvis was performed using the standard protocol following bolus administration of intravenous contrast. CONTRAST:  194mL OMNIPAQUE IOHEXOL 300 MG/ML  SOLN COMPARISON:  None. FINDINGS: Lower chest: The visualized lung bases are clear. No intra-abdominal free air or free fluid. Hepatobiliary: Severe fatty infiltration of the liver. No intrahepatic biliary ductal dilatation. The gallbladder is unremarkable. Pancreas: Atrophic pancreas. No Accu to inflammatory changes. No dilatation of the main pancreatic duct. Spleen: Normal in size without focal abnormality. Adrenals/Urinary Tract: The adrenal glands are unremarkable. There is mild bilateral hydronephrosis, right greater left. There is enhancement of the right renal urothelium concerning for pyelonephritis. Correlation with urinalysis recommended. There is slight delayed enhancement of the right renal parenchyma. Multiple right renal cysts measure up to 4 cm in the interpolar aspect of the right kidney. Additional subcentimeter right renal hypodense lesions are too small to characterize. The urinary bladder is distended. Stomach/Bowel: There is narrowing of a segment of the rectosigmoid, likely related to mass effect caused by distended urinary  bladder. There is mild thickened appearance of the segment of colon with surrounding stranding. The perisigmoid stranding may be related to UTI but concerning for stercoral colitis. Clinical correlation is recommended. There is probable mild associate luminal narrowing of the sigmoid colon. No evidence of small-bowel obstruction. The appendix is not visualized with certainty. No inflammatory changes identified in the right lower quadrant. Vascular/Lymphatic: The abdominal aorta and IVC unremarkable. No portal venous gas. There is no adenopathy. Reproductive: The prostate and seminal vesicles are grossly unremarkable. Partially visualized fluid within the left inguinal canal likely related to hydrocele. Other: Mild diffuse subcutaneous edema. Musculoskeletal: Degenerative changes of the spine. No acute osseous pathology. IMPRESSION: 1. Distended urinary bladder with mild bilateral hydronephrosis and findings of right-sided pyelonephritis. Correlation with urinalysis recommended. 2. Thickened appearance of the rectosigmoid with surrounding stranding which may be related to UTI but concerning for stercoral colitis. Clinical correlation is recommended. 3. Severe fatty liver. Electronically Signed   By: Anner Crete M.D.   On: 06/04/2019 23:41   US Venous Img Lower Bilateral (DVT)  Result Date: 06/05/2019 CLINICAL DATA:  Bilateral lower extremity pain and edema for 1 month. EXAM: BILATERAL LOWER EXTREMITY VENOUS DOPPLER ULTRASOUND TECHNIQUE: Gray-scale sonography with compression, as well as color and duplex ultrasound, were performed to evaluate the deep venous system(s) from the level of the common femoral vein through the popliteal and proximal calf veins. COMPARISON:  None. FINDINGS: VENOUS Normal compressibility of the common femoral, superficial femoral, and popliteal veins, as well as the visualized calf veins. Visualized portions of profunda femoral vein and great saphenous vein unremarkable. No filling  defects to suggest DVT on grayscale or color Doppler imaging. Doppler waveforms show normal direction of venous flow, normal respiratory phasicity and response to augmentation. OTHER None. Limitations: none IMPRESSION: No femoropopliteal DVT nor evidence of DVT within the visualized calf veins. If clinical symptoms are inconsistent or if there are persistent or worsening symptoms, further imaging (possibly involving the iliac veins) may be warranted. Electronically Signed   By: Abigail Miyamoto M.D.   On: 06/05/2019 11:44   DG Chest Port 1 View  Result Date: 06/04/2019 CLINICAL DATA:  Tachycardia, bilateral lower extremity edema EXAM: PORTABLE CHEST 1 VIEW COMPARISON:  05/13/2018 FINDINGS: The heart size and mediastinal contours are within normal limits. Both lungs are clear. The visualized skeletal structures are unremarkable. IMPRESSION: No active disease. Electronically Signed   By: Randa Ngo M.D.   On: 06/04/2019 20:32   DG Abd 2 Views  Result Date: 06/11/2019 CLINICAL DATA:  Abdominal pain. EXAM: ABDOMEN - 2 VIEW COMPARISON:  None. FINDINGS: No free air, portal venous gas, or pneumatosis. Numerous air-filled mildly prominent but nondilated loops of small bowel. No gas in the region of the rectum. No other acute abnormalities. IMPRESSION: 1. Numerous mildly prominent loops of small bowel without significant dilatation. Developing ileus favored. Very early small bowel obstruction not definitely excluded despite the lack of definitive dilatation as no definitive colonic gas is noted. Recommend clinical correlation and attention on follow-up. Electronically Signed   By: Dorise Bullion III M.D   On: 06/11/2019 13:29   ECHOCARDIOGRAM COMPLETE  Result Date: 06/05/2019    ECHOCARDIOGRAM REPORT   Patient Name:   DAMEN WINDSOR Date of Exam: 06/05/2019 Medical Rec #:  [id number]     Height:       73.0 in Accession #:    [id number]    Weight:       205.0 lb Date of Birth:  1950/10/15    BSA:          2.174 m  Patient Age:    54 years      BP:           152/86 mmHg Patient Gender: M             HR:           90 bpm. Exam Location:  [facility] Procedure: 2D Echo, Cardiac Doppler and Color Doppler Indications:    Lower extremity edema [218969]                 Alcohol abuse [250539]  History:        Patient has no prior history of Echocardiogram examinations.                 Risk Factors:Hypertension, Diabetes, Current Smoker and                 Dyslipidemia.  Sonographer:    Alvino Chapel RCS Referring Phys: [place]  1. Left ventricular ejection fraction, by estimation, is 60 to 65%. The left ventricle has normal function. The left ventricle has no regional wall motion abnormalities. There is moderate left ventricular hypertrophy. Left ventricular diastolic parameters were normal.  2. Right ventricular systolic function is normal. The right ventricular size is normal.  3. Left atrial size was mildly dilated.  4. The mitral valve is normal in structure. No evidence of mitral valve regurgitation.  5. The aortic valve is tricuspid. Aortic valve regurgitation is not visualized. No aortic stenosis is present. FINDINGS  Left Ventricle:  Left ventricular ejection fraction, by estimation, is 60 to 65%. The left ventricle has normal function. The left ventricle has no regional wall motion abnormalities. The left ventricular internal cavity size was normal in size. There is  moderate left ventricular hypertrophy. Left ventricular diastolic parameters were normal. Right Ventricle: The right ventricular size is normal. Right vetricular wall thickness was not assessed. Right ventricular systolic function is normal. There is mildly elevated pulmonary artery systolic pressure. The tricuspid regurgitant velocity is 2.55 m/s, and with an assumed right atrial pressure of 8 mmHg, the estimated right ventricular systolic pressure is 19.5 mmHg. Left Atrium: Left atrial size was mildly dilated. Right Atrium: Right atrial  size was normal in size. Pericardium: Trivial pericardial effusion is present. Presence of pericardial fat pad. Mitral Valve: The mitral valve is normal in structure. No evidence of mitral valve regurgitation. Tricuspid Valve: The tricuspid valve is normal in structure. Tricuspid valve regurgitation is trivial. Aortic Valve: The aortic valve is tricuspid. Aortic valve regurgitation is not visualized. No aortic stenosis is present. Pulmonic Valve: The pulmonic valve was not well visualized. Pulmonic valve regurgitation is not visualized. Aorta: The aortic root is normal in size and structure. IAS/Shunts: The interatrial septum was not well visualized.  LEFT VENTRICLE PLAX 2D LVIDd:         4.85 cm  Diastology LVIDs:         3.13 cm  LV e' lateral:   10.80 cm/s LV PW:         1.35 cm  LV E/e' lateral: 9.4 LV IVS:        1.41 cm  LV e' medial:    9.03 cm/s LVOT diam:     2.00 cm  LV E/e' medial:  11.2 LV SV:         76 LV SV Index:   35 LVOT Area:     3.14 cm  RIGHT VENTRICLE RV Mid diam:    3.04 cm RV S prime:     19.60 cm/s TAPSE (M-mode): 2.1 cm LEFT ATRIUM             Index LA diam:        4.10 cm 1.89 cm/m LA Vol (A2C):   95.3 ml 43.83 ml/m LA Vol (A4C):   66.2 ml 30.45 ml/m LA Biplane Vol: 80.8 ml 37.16 ml/m  AORTIC VALVE LVOT Vmax:   112.00 cm/s LVOT Vmean:  79.100 cm/s LVOT VTI:    0.241 m  AORTA Ao Root diam: 3.10 cm MITRAL VALVE                TRICUSPID VALVE MV Area (PHT): 3.07 cm     TR Peak grad:   26.0 mmHg MV Decel Time: 247 msec     TR Vmax:        255.00 cm/s MV E velocity: 101.00 cm/s MV A velocity: 95.10 cm/s   SHUNTS MV E/A ratio:  1.06         Systemic VTI:  0.24 m                             Systemic Diam: 2.00 cm Oswaldo Milian MD Electronically signed by Oswaldo Milian MD Signature Date/Time: 06/05/2019/10:46:22 PM    Final      CBC Recent Labs  Lab 06/04/19 2005 06/05/19 0421 06/07/19 0425 06/08/19 0354 06/09/19 0518 06/10/19 0606 06/11/19 0604  WBC 9.7   < > 8.4  7.3 7.1  7.8 11.2*  HGB 7.6*   < > 7.6* 7.2* 7.1* 6.9* 8.2*  HCT 23.1*   < > 23.5* 22.2* 22.1* 21.4* 25.4*  PLT 387   < > 317 287 299 282 305  MCV 93.9   < > 92.9 93.7 94.0 94.7 91.4  MCH 30.9   < > 30.0 30.4 30.2 30.5 29.5  MCHC 32.9   < > 32.3 32.4 32.1 32.2 32.3  RDW 18.6*   < > 18.3* 18.2* 17.9* 17.9* 17.6*  LYMPHSABS 1.2  --   --   --   --   --   --   MONOABS 0.5  --   --   --   --   --   --   EOSABS 0.0  --   --   --   --   --   --   BASOSABS 0.0  --   --   --   --   --   --    < > = values in this interval not displayed.    Chemistries  Recent Labs  Lab 06/04/19 2005 06/04/19 2005 06/05/19 0421 06/05/19 1540 06/06/19 0420 06/06/19 0420 06/07/19 0425 06/08/19 0354 06/09/19 0518 06/10/19 0606 06/11/19 0604  NA 139   < > 141   < > 145   < > 148* 145 144 142 142  K 2.4*   < > 2.7*   < > 2.7*   < > 3.1* 2.7* 2.6* 2.5* 2.3*  CL 114*   < > 116*   < > 120*   < > 119* 118* 118* 119* 118*  CO2 15*   < > 15*   < > 18*   < > 19* 20* 18* 18* 17*  GLUCOSE 124*   < > 123*   < > 108*   < > 114* 115* 104* 111* 146*  BUN 29*   < > 26*   < > 22   < > 17 14 12 12 9   CREATININE 1.38*   < > 1.28*   < > 0.99   < > 1.07 0.96 0.93 1.05 1.04  CALCIUM 6.4*   < > 6.5*   < > 6.9*   < > 7.1* 7.0* 7.1* 7.0* 7.3*  MG  --   --  1.3*  --  1.9  --   --  1.7 1.8 1.6*  --   AST 43*  --  47*  --  33  --   --  37 42*  --   --   ALT 32  --  32  --  27  --   --  23 24  --   --   ALKPHOS 115  --  114  --  89  --   --  90 92  --   --   BILITOT 1.0  --  1.0  --  0.7  --   --  0.5 0.5  --   --    < > = values in this interval not displayed.   ------------------------------------------------------------------------------------------------------------------ No results for input(s): CHOL, HDL, LDLCALC, TRIG, CHOLHDL, LDLDIRECT in the last 72 hours.  Lab Results  Component Value Date   HGBA1C 6.9 (H) 05/13/2018    ------------------------------------------------------------------------------------------------------------------ No results for input(s): TSH, T4TOTAL, T3FREE, THYROIDAB in the last 72 hours.  Invalid input(s): FREET3 ------------------------------------------------------------------------------------------------------------------ No results for input(s): VITAMINB12, FOLATE, FERRITIN, TIBC, IRON, RETICCTPCT in the last 72 hours.  Coagulation profile Recent Labs  Lab 06/04/19  2005  INR 1.2    No results for input(s): DDIMER in the last 72 hours.  Cardiac Enzymes No results for input(s): CKMB, TROPONINI, MYOGLOBIN in the last 168 hours.  Invalid input(s): CK ------------------------------------------------------------------------------------------------------------------    Component Value Date/Time   BNP 238.0 (H) 06/05/2019 1901     Roxan Hockey M.D on 06/11/2019 at 4:51 PM  Go to [email or web address] - for contact info  [facility] - Office  [phone]

## 2019-06-11 NOTE — Progress Notes (Signed)
Patient had five liquid BM's this shift. Patient in agreement to placing rectal tube. Upon insertion, patient unable to relax and did not tolerate well. Patient demanding for rectal tube to be removed. Rectal tube removed by nursing staff.

## 2019-06-12 LAB — BASIC METABOLIC PANEL
Anion gap: 7 (ref 5–15)
BUN: 7 mg/dL — ABNORMAL LOW (ref 8–23)
CO2: 17 mmol/L — ABNORMAL LOW (ref 22–32)
Calcium: 7.1 mg/dL — ABNORMAL LOW (ref 8.9–10.3)
Chloride: 119 mmol/L — ABNORMAL HIGH (ref 98–111)
Creatinine, Ser: 1.04 mg/dL (ref 0.61–1.24)
GFR calc Af Amer: >60 mL/min (ref >60)
GFR calc non Af Amer: >60 mL/min (ref >60)
Glucose, Bld: 96 mg/dL (ref 70–99)
Potassium: 2.3 mmol/L — CL (ref 3.5–5.1)
Sodium: 143 mmol/L (ref 135–145)

## 2019-06-12 LAB — MAGNESIUM: Magnesium: 1.6 mg/dL — ABNORMAL LOW (ref 1.7–2.4)

## 2019-06-12 MED ORDER — POTASSIUM CHLORIDE 10 MEQ/100ML IV SOLN
10.0000 meq | INTRAVENOUS | Status: AC
  Administered 2019-06-12 (×4): 10 meq via INTRAVENOUS
  Filled 2019-06-12 (×4): qty 100

## 2019-06-12 MED ORDER — POTASSIUM CHLORIDE CRYS ER 20 MEQ PO TBCR
40.0000 meq | EXTENDED_RELEASE_TABLET | Freq: Once | ORAL | Status: AC
  Administered 2019-06-12: 40 meq via ORAL
  Filled 2019-06-12: qty 2

## 2019-06-12 MED ORDER — MAGNESIUM SULFATE 4 GM/100ML IV SOLN
4.0000 g | Freq: Once | INTRAVENOUS | Status: AC
  Administered 2019-06-12: 4 g via INTRAVENOUS
  Filled 2019-06-12: qty 100

## 2019-06-12 MED ORDER — POTASSIUM CHLORIDE CRYS ER 20 MEQ PO TBCR
40.0000 meq | EXTENDED_RELEASE_TABLET | ORAL | Status: AC
  Administered 2019-06-12 (×3): 40 meq via ORAL
  Filled 2019-06-12 (×3): qty 2

## 2019-06-12 NOTE — Progress Notes (Signed)
CRITICAL VALUE ALERT  Critical Value:  Potassium 2.3  Date & Time Notied:  06/12/2019 9201  Provider Notified: Dr. Joesph Fillers  Orders Received/Actions taken: no new orders at this time

## 2019-06-12 NOTE — Progress Notes (Addendum)
Patient Demographics:    Darren Howard, is a 69 y.o. male, DOB - May 04, 1950, JKK:[id number]  Admit date - 06/04/2019   Admitting Physician Kathie Dike, MD  Outpatient Primary MD for the patient is [name], MD  LOS - 7  Chief Complaint  Patient presents with  . Leg Swelling       Subjective:    Darren Howard today has no fevers, no emesis,  No chest pain,  -Continues to have numerous/frequent greenish mucousy stool---almost jelly appearing stools--- -Hypokalemia noted-down to 2.3 despite replacement due to GI losses, magnesium is low again at 1.6 despite replacement -- ---Awaiting further input from GI service regarding persistent diarrhea leading to severe and persistent electrolyte abnormalities  Assessment  & Plan :    Principal Problem:   Pyelonephritis of right kidney Active Problems:   Hypertension   Normocytic anemia   DM2 (diabetes mellitus, type 2) (HCC)   Hypokalemia   Hypocalcemia   Alcohol abuse   Tobacco use   Pyelonephritis   Abnormal CT scan, colon   Diarrhea   Urinary retention  Brief Summary:- 69 y.o. male with medical history significant of alcohol abuse, chronic normocytic anemia, medication and treatment noncompliance, history of duodenal reduction, erosive gastropathy, GI bleed, glucose intolerance/type 2 diabetes, hyperlipidemia, hypertension admitted on 06/05/2019 with possible right-sided pyelonephritis, as well as stercoral colitis and found to have alcohol withdrawal symptoms -Now with frequent mucousy stools recurrent significant hypokalemia and hypomagnesemia  A/p 1)Stercoral Colitis--- patient previously received Vancomycin, Rocephin and Cefepime  -Okay to stop Augmentin on 06/12/2019 --Continues to have numerous/frequent greenish mucousy stool---almost jelly appearing stools--- --l--C. difficile test and GI pathogen negative, okay to use Imodium as  needed -Abdominal x-rays with nonspecific ileus type findings -We will get CT abdomen and pelvis Potassium and magnesium mildly low due to frequent BMs/GI losses---replacing electrolytes --At  risk for dehydration and severe electrolyte abnormalities given frequent stools- requires IV dextrose fluids -Despite aggressive IV and oral replacement of electrolytes repeat potassium remains 2.3 and repeat magnesium remains 1.6 requiring further IV and oral replacements -GI input/consult appreciated -Continue Anusol AC for hemorrhoids -Awaiting further input from GI service regarding persistent diarrhea leading to severe and persistent electrolyte abnormalities -Abdominal x-rays from 06/11/2019 suggest possible ileus versus early obstruction x-ray reviewed and discussed with general surgeon Dr. Arnoldo Morale   2)Alcohol Abuse with DTs--- DT  resolved,  --continue folic acid, thiamine/multivitamin  -Patient is coherent  3)DM2--last A1c 6.9 reflecting excellent DM control PTA -Metformin on hold Use Novolog/Humalog Sliding scale insulin with Accu-Cheks/Fingersticks as ordered   4) hypocalcemia/hypokalemia/hypomagnesemia and hypoalbuminemia-----due to GI losses the patient with frequent stools ---At  risk for dehydration and severe electrolyte abnormalities given frequent stools- requires IV dextrose fluids  5)HTN--stable, continue Coreg at reduced dose of 12.5 mg twice daily, continue to hold amlodipine  6) suspected right-sided pyelonephritis--- urine and blood cultures negative, antibiotics as above #1 for stercoral colitis rather than pyelonephritis  7)Acute symptomatic blood loss anemia superimposed on chronic blood loss anemia ----admission hemoglobin was  6.5 , initially received 1 unit of blood on 06/06/2019, -no significant tachycardia at this time most likely due to Coreg use Hemoglobin up to 8.2 after another 1 unit of packed cells on 06/10/2019 --Stool occult blood  is positive, no overt GI bleed  at this time -GI consult appreciated recommends possible outpatient colonoscopy given poor prep with last colonoscopy and polyp removal at the time  8)Urinary retention-- -Unable to void after removal of Foley catheter --Bladder scan over 500 Foley reinserted on 06/09/2019 -Flomax as ordered  Disposition/Need for in-Hospital Stay- patient unable to be discharged at this time due to --- frequent stools leading to severe electrolyte abnormalities and risk for dehydration requiring IV fluids and IV replacements of magnesium and potassium -Patient From: home  D/C Place: home  Barriers: Not Clinically Stable- --- persistent diarrhea --- high risk for dehydration and severe electrolyte abnormalities given frequency of stools--- potassium is down to 2.3 and magnesium down to 1.6 despite IV and oral replacement  Code Status : full  Family Communication:   -Discussed with son [name]   Consults  : GI   DVT Prophylaxis  :   - SCDs   Lab Results  Component Value Date   PLT 305 06/11/2019   Inpatient Medications  Scheduled Meds: . amoxicillin-clavulanate  1 tablet Oral Q12H  . carvedilol  12.5 mg Oral BID  . Chlorhexidine Gluconate Cloth  6 each Topical Daily  . folic acid  1 mg Oral Daily  . hydrocortisone   Rectal QID  . hydrocortisone-pramoxine  1 applicator Rectal BID  . linaclotide  145 mcg Oral QAC breakfast  . metroNIDAZOLE  500 mg Oral TID  . multivitamin with minerals  1 tablet Oral Daily  . pantoprazole  40 mg Oral BID AC  . rifaximin  550 mg Oral BID  . tamsulosin  0.4 mg Oral BID  . thiamine  100 mg Oral Daily   Or  . thiamine  100 mg Intravenous Daily   Continuous Infusions: . dextrose 5 % and 0.45 % NaCl with KCl 20 mEq/L 30 mL/hr at 06/11/19 1817  . magnesium sulfate bolus IVPB     PRN Meds:.traMADol  Anti-infectives (From admission, onward)   Start     Dose/Rate Route Frequency Ordered Stop   06/09/19 1800  metroNIDAZOLE (FLAGYL) tablet 500 mg     500 mg Oral 3  times daily 06/09/19 1745 06/12/19 2359   06/07/19 2130  rifaximin (XIFAXAN) tablet 550 mg     550 mg Oral 2 times daily 06/07/19 2119     06/06/19 2200  amoxicillin-clavulanate (AUGMENTIN) 875-125 MG per tablet 1 tablet     1 tablet Oral Every 12 hours 06/06/19 1822 06/12/19 2359   06/05/19 0645  metroNIDAZOLE (FLAGYL) IVPB 500 mg     500 mg 100 mL/hr over 60 Minutes Intravenous  Once 06/05/19 0640 06/05/19 0954   06/05/19 0600  ceFEPIme (MAXIPIME) 2 g in sodium chloride 0.9 % 100 mL IVPB  Status:  Discontinued     2 g 200 mL/hr over 30 Minutes Intravenous Every 8 hours 06/05/19 0217 06/05/19 0357   06/05/19 0400  cefTRIAXone (ROCEPHIN) 1 g in sodium chloride 0.9 % 100 mL IVPB  Status:  Discontinued     1 g 200 mL/hr over 30 Minutes Intravenous Every 24 hours 06/05/19 0357 06/06/19 1822   06/04/19 2130  vancomycin (VANCOREADY) IVPB 2000 mg/400 mL     2,000 mg 200 mL/hr over 120 Minutes Intravenous  Once 06/04/19 2130 06/05/19 0216   06/04/19 2115  ceFEPIme (MAXIPIME) 2 g in sodium chloride 0.9 % 100 mL IVPB     2 g 200 mL/hr over 30 Minutes Intravenous  Once 06/04/19 2105 06/04/19 2226  06/04/19 2115  metroNIDAZOLE (FLAGYL) IVPB 500 mg     500 mg 100 mL/hr over 60 Minutes Intravenous  Once 06/04/19 2105 06/05/19 0028   06/04/19 2115  vancomycin (VANCOCIN) IVPB 1000 mg/200 mL premix  Status:  Discontinued     1,000 mg 200 mL/hr over 60 Minutes Intravenous  Once 06/04/19 2105 06/04/19 2130        Objective:   Vitals:   06/11/19 2152 06/11/19 2200 06/12/19 0518 06/12/19 1334  BP: (!) 162/69  (!) 166/75 137/81  Pulse: 76  88 79  Resp: 18  18 20   Temp: (!) 100.9 F (38.3 C) 99.2 F (37.3 C) 99.1 F (37.3 C) 99.5 F (37.5 C)  TempSrc: Oral Oral Oral Oral  SpO2: 100%  100% 100%  Weight:      Height:        Wt Readings from Last 3 Encounters:  06/08/19 98.7 kg  05/31/18 100 kg  05/17/18 93.5 kg    Intake/Output Summary (Last 24 hours) at 06/12/2019 1757 Last data filed  at 06/12/2019 1701 Gross per 24 hour  Intake 830.38 ml  Output 2425 ml  Net -1594.62 ml   Physical Exam  Gen:-coherent, in no acute distress HEENT:- [place].AT, No sclera icterus Neck-Supple Neck,No JVD,.  Lungs-  CTAB , fair symmetrical air movement CV- S1, S2 normal, regular ,  Abd-  +ve B.Sounds, Abd Soft, No tenderness, no CVA area tenderness Extremity/Skin:- No  edema, pedal pulses present  Psych-alert and oriented mostly,  neuro-generalized weakness, no new focal deficits,  GU-Foley in situ   Data Review:   Micro Results Recent Results (from the past 240 hour(s))  Culture, blood (routine x 2)     Status: None   Collection Time: 06/04/19  8:05 PM   Specimen: Left Antecubital; Blood  Result Value Ref Range Status   Specimen Description LEFT ANTECUBITAL  Final   Special Requests   Final    BOTTLES DRAWN AEROBIC AND ANAEROBIC Blood Culture adequate volume   Culture   Final    NO GROWTH 5 DAYS Performed at [facility], [address], [place], [place] [address]    Report Status 06/09/2019 FINAL  Final  Urine culture     Status: None   Collection Time: 06/04/19  8:12 PM   Specimen: Urine, Catheterized  Result Value Ref Range Status   Specimen Description   Final    URINE, CATHETERIZED Performed at [facility], [address], [place], [place] [address]    Special Requests   Final    NONE Performed at [facility], [address], [place], [place] [address]    Culture   Final    NO GROWTH Performed at [facility], [place] [address] [place], [place] [address]    Report Status 06/07/2019 FINAL  Final  Culture, blood (routine x 2)     Status: None   Collection Time: 06/04/19  8:49 PM   Specimen: Right Antecubital; Blood  Result Value Ref Range Status   Specimen Description RIGHT ANTECUBITAL  Final   Special Requests   Final    BOTTLES DRAWN AEROBIC AND ANAEROBIC Blood Culture adequate volume   Culture   Final    NO GROWTH 5 DAYS Performed at [facility],  [address] [place],  [address]    Report Status 06/09/2019 FINAL  Final  MRSA PCR Screening     Status: None   Collection Time: 06/05/19  6:46 PM   Specimen: Nasopharyngeal  Result  Value Ref Range Status   MRSA by PCR NEGATIVE NEGATIVE Final    Comment:        The GeneXpert MRSA Assay (FDA approved for NASAL specimens only), is one component of a comprehensive MRSA colonization surveillance program. It is not intended to diagnose MRSA infection nor to guide or monitor treatment for MRSA infections. Performed at [facility], [address], [place], [place] [address]   Respiratory Panel by RT PCR (Flu A&B, Covid) - Nasopharyngeal Swab     Status: None   Collection Time: 06/06/19  2:51 AM   Specimen: Nasopharyngeal Swab  Result Value Ref Range Status   SARS Coronavirus 2 by RT PCR NEGATIVE NEGATIVE Final    Comment: (NOTE) SARS-CoV-2 target nucleic acids are NOT DETECTED. The SARS-CoV-2 RNA is generally detectable in upper respiratoy specimens during the acute phase of infection. The lowest concentration of SARS-CoV-2 viral copies this assay can detect is 131 copies/mL. A negative result does not preclude SARS-Cov-2 infection and should not be used as the sole basis for treatment or other patient management decisions. A negative result may occur with  improper specimen collection/handling, submission of specimen other than nasopharyngeal swab, presence of viral mutation(s) within the areas targeted by this assay, and inadequate number of viral copies (<131 copies/mL). A negative result must be combined with clinical observations, patient history, and epidemiological information. The expected result is Negative. Fact Sheet for Patients:  PinkCheek.be Fact Sheet for Healthcare Providers:  GravelBags.it This test is not yet ap proved or cleared by the [place] FDA and  has been authorized for detection and/or  diagnosis of SARS-CoV-2 by FDA under an Emergency Use Authorization (EUA). This EUA will remain  in effect (meaning this test can be used) for the duration of the COVID-19 declaration under Section 564(b)(1) of the Act, 21 U.S.C. section 360bbb-3(b)(1), unless the authorization is terminated or revoked sooner.    Influenza A by PCR NEGATIVE NEGATIVE Final   Influenza B by PCR NEGATIVE NEGATIVE Final    Comment: (NOTE) The Xpert Xpress SARS-CoV-2/FLU/RSV assay is intended as an aid in  the diagnosis of influenza from Nasopharyngeal swab specimens and  should not be used as a sole basis for treatment. Nasal washings and  aspirates are unacceptable for Xpert Xpress SARS-CoV-2/FLU/RSV  testing. Fact Sheet for Patients: PinkCheek.be Fact Sheet for Healthcare Providers: GravelBags.it This test is not yet approved or cleared by the [place] FDA and  has been authorized for detection and/or diagnosis of SARS-CoV-2 by  FDA under an Emergency Use Authorization (EUA). This EUA will remain  in effect (meaning this test can be used) for the duration of the  Covid-19 declaration under Section 564(b)(1) of the Act, 21  U.S.C. section 360bbb-3(b)(1), unless the authorization is  terminated or revoked. Performed at [facility], [address] [place], [place] [address]   Gastrointestinal Panel by PCR , Stool     Status: None   Collection Time: 06/09/19  2:12 PM   Specimen: Stool  Result Value Ref Range Status   Campylobacter species NOT DETECTED NOT DETECTED Final   Plesimonas shigelloides NOT DETECTED NOT DETECTED Final   Salmonella species NOT DETECTED NOT DETECTED Final   Yersinia enterocolitica NOT DETECTED NOT DETECTED Final   Vibrio species NOT DETECTED NOT DETECTED Final   Vibrio cholerae NOT DETECTED NOT DETECTED Final   Enteroaggregative E coli (EAEC) NOT DETECTED NOT DETECTED Final   Enteropathogenic E coli (EPEC) NOT  DETECTED NOT DETECTED Final  Enterotoxigenic E coli (ETEC) NOT DETECTED NOT DETECTED Final   Shiga like toxin producing E coli (STEC) NOT DETECTED NOT DETECTED Final   Shigella/Enteroinvasive E coli (EIEC) NOT DETECTED NOT DETECTED Final   Cryptosporidium NOT DETECTED NOT DETECTED Final   Cyclospora cayetanensis NOT DETECTED NOT DETECTED Final   Entamoeba histolytica NOT DETECTED NOT DETECTED Final   Giardia lamblia NOT DETECTED NOT DETECTED Final   Adenovirus F40/41 NOT DETECTED NOT DETECTED Final   Astrovirus NOT DETECTED NOT DETECTED Final   Norovirus GI/GII NOT DETECTED NOT DETECTED Final   Rotavirus A NOT DETECTED NOT DETECTED Final   Sapovirus (I, II, IV, and V) NOT DETECTED NOT DETECTED Final    Comment: Performed at [facility], [place] [place], [place] [address]  C Difficile Quick Screen (NO PCR Reflex)     Status: None   Collection Time: 06/09/19  2:13 PM   Specimen: Stool  Result Value Ref Range Status   C Diff antigen NEGATIVE NEGATIVE Final   C Diff toxin NEGATIVE NEGATIVE Final   C Diff interpretation No C. difficile detected.  Final    Comment: Performed at [facility], [address] [place], [place] [address]    Radiology Reports CT Abdomen Pelvis W Contrast  Result Date: 06/04/2019 CLINICAL DATA:  69 year old male with abdominal distension and diarrhea. Concern for acute diverticulitis. EXAM: CT ABDOMEN AND PELVIS WITH CONTRAST TECHNIQUE: Multidetector CT imaging of the abdomen and pelvis was performed using the standard protocol following bolus administration of intravenous contrast. CONTRAST:  176mL OMNIPAQUE IOHEXOL 300 MG/ML  SOLN COMPARISON:  None. FINDINGS: Lower chest: The visualized lung bases are clear. No intra-abdominal free air or free fluid. Hepatobiliary: Severe fatty infiltration of the liver. No intrahepatic biliary ductal dilatation. The gallbladder is unremarkable. Pancreas: Atrophic pancreas. No Accu to inflammatory changes. No  dilatation of the main pancreatic duct. Spleen: Normal in size without focal abnormality. Adrenals/Urinary Tract: The adrenal glands are unremarkable. There is mild bilateral hydronephrosis, right greater left. There is enhancement of the right renal urothelium concerning for pyelonephritis. Correlation with urinalysis recommended. There is slight delayed enhancement of the right renal parenchyma. Multiple right renal cysts measure up to 4 cm in the interpolar aspect of the right kidney. Additional subcentimeter right renal hypodense lesions are too small to characterize. The urinary bladder is distended. Stomach/Bowel: There is narrowing of a segment of the rectosigmoid, likely related to mass effect caused by distended urinary bladder. There is mild thickened appearance of the segment of colon with surrounding stranding. The perisigmoid stranding may be related to UTI but concerning for stercoral colitis. Clinical correlation is recommended. There is probable mild associate luminal narrowing of the sigmoid colon. No evidence of small-bowel obstruction. The appendix is not visualized with certainty. No inflammatory changes identified in the right lower quadrant. Vascular/Lymphatic: The abdominal aorta and IVC unremarkable. No portal venous gas. There is no adenopathy. Reproductive: The prostate and seminal vesicles are grossly unremarkable. Partially visualized fluid within the left inguinal canal likely related to hydrocele. Other: Mild diffuse subcutaneous edema. Musculoskeletal: Degenerative changes of the spine. No acute osseous pathology. IMPRESSION: 1. Distended urinary bladder with mild bilateral hydronephrosis and findings of right-sided pyelonephritis. Correlation with urinalysis recommended. 2. Thickened appearance of the rectosigmoid with surrounding stranding which may be related to UTI but concerning for stercoral colitis. Clinical correlation is recommended. 3. Severe fatty liver. Electronically  Signed   By: Anner Crete M.D.   On: 06/04/2019 23:41   US  Venous Img Lower Bilateral (DVT)  Result Date: 06/05/2019 CLINICAL DATA:  Bilateral lower extremity pain and edema for 1 month. EXAM: BILATERAL LOWER EXTREMITY VENOUS DOPPLER ULTRASOUND TECHNIQUE: Gray-scale sonography with compression, as well as color and duplex ultrasound, were performed to evaluate the deep venous system(s) from the level of the common femoral vein through the popliteal and proximal calf veins. COMPARISON:  None. FINDINGS: VENOUS Normal compressibility of the common femoral, superficial femoral, and popliteal veins, as well as the visualized calf veins. Visualized portions of profunda femoral vein and great saphenous vein unremarkable. No filling defects to suggest DVT on grayscale or color Doppler imaging. Doppler waveforms show normal direction of venous flow, normal respiratory phasicity and response to augmentation. OTHER None. Limitations: none IMPRESSION: No femoropopliteal DVT nor evidence of DVT within the visualized calf veins. If clinical symptoms are inconsistent or if there are persistent or worsening symptoms, further imaging (possibly involving the iliac veins) may be warranted. Electronically Signed   By: Abigail Miyamoto M.D.   On: 06/05/2019 11:44   DG Chest Port 1 View  Result Date: 06/04/2019 CLINICAL DATA:  Tachycardia, bilateral lower extremity edema EXAM: PORTABLE CHEST 1 VIEW COMPARISON:  05/13/2018 FINDINGS: The heart size and mediastinal contours are within normal limits. Both lungs are clear. The visualized skeletal structures are unremarkable. IMPRESSION: No active disease. Electronically Signed   By: Randa Ngo M.D.   On: 06/04/2019 20:32   DG Abd 2 Views  Result Date: 06/11/2019 CLINICAL DATA:  Abdominal pain. EXAM: ABDOMEN - 2 VIEW COMPARISON:  None. FINDINGS: No free air, portal venous gas, or pneumatosis. Numerous air-filled mildly prominent but nondilated loops of small bowel. No gas in  the region of the rectum. No other acute abnormalities. IMPRESSION: 1. Numerous mildly prominent loops of small bowel without significant dilatation. Developing ileus favored. Very early small bowel obstruction not definitely excluded despite the lack of definitive dilatation as no definitive colonic gas is noted. Recommend clinical correlation and attention on follow-up. Electronically Signed   By: Dorise Bullion III M.D   On: 06/11/2019 13:29   ECHOCARDIOGRAM COMPLETE  Result Date: 06/05/2019    ECHOCARDIOGRAM REPORT   Patient Name:   SAYVION VIGEN Date of Exam: 06/05/2019 Medical Rec #:  [id number]     Height:       73.0 in Accession #:    [id number]    Weight:       205.0 lb Date of Birth:  [birth year]    BSA:          2.174 m Patient Age:    39 years      BP:           152/86 mmHg Patient Gender: M             HR:           90 bpm. Exam Location:  [facility] Procedure: 2D Echo, Cardiac Doppler and Color Doppler Indications:    Lower extremity edema [218969]                 Alcohol abuse [[id number]]  History:        Patient has no prior history of Echocardiogram examinations.                 Risk Factors:Hypertension, Diabetes, Current Smoker and                 Dyslipidemia.  Sonographer:    Alvino Chapel RCS Referring Phys: [phone] [facility]  IMPRESSIONS  1. Left ventricular ejection fraction, by estimation, is 60 to 65%. The left ventricle has normal function. The left ventricle has no regional wall motion abnormalities. There is moderate left ventricular hypertrophy. Left ventricular diastolic parameters were normal.  2. Right ventricular systolic function is normal. The right ventricular size is normal.  3. Left atrial size was mildly dilated.  4. The mitral valve is normal in structure. No evidence of mitral valve regurgitation.  5. The aortic valve is tricuspid. Aortic valve regurgitation is not visualized. No aortic stenosis is present. FINDINGS  Left Ventricle: Left ventricular ejection fraction, by  estimation, is 60 to 65%. The left ventricle has normal function. The left ventricle has no regional wall motion abnormalities. The left ventricular internal cavity size was normal in size. There is  moderate left ventricular hypertrophy. Left ventricular diastolic parameters were normal. Right Ventricle: The right ventricular size is normal. Right vetricular wall thickness was not assessed. Right ventricular systolic function is normal. There is mildly elevated pulmonary artery systolic pressure. The tricuspid regurgitant velocity is 2.55 m/s, and with an assumed right atrial pressure of 8 mmHg, the estimated right ventricular systolic pressure is 53.6 mmHg. Left Atrium: Left atrial size was mildly dilated. Right Atrium: Right atrial size was normal in size. Pericardium: Trivial pericardial effusion is present. Presence of pericardial fat pad. Mitral Valve: The mitral valve is normal in structure. No evidence of mitral valve regurgitation. Tricuspid Valve: The tricuspid valve is normal in structure. Tricuspid valve regurgitation is trivial. Aortic Valve: The aortic valve is tricuspid. Aortic valve regurgitation is not visualized. No aortic stenosis is present. Pulmonic Valve: The pulmonic valve was not well visualized. Pulmonic valve regurgitation is not visualized. Aorta: The aortic root is normal in size and structure. IAS/Shunts: The interatrial septum was not well visualized.  LEFT VENTRICLE PLAX 2D LVIDd:         4.85 cm  Diastology LVIDs:         3.13 cm  LV e' lateral:   10.80 cm/s LV PW:         1.35 cm  LV E/e' lateral: 9.4 LV IVS:        1.41 cm  LV e' medial:    9.03 cm/s LVOT diam:     2.00 cm  LV E/e' medial:  11.2 LV SV:         76 LV SV Index:   35 LVOT Area:     3.14 cm  RIGHT VENTRICLE RV Mid diam:    3.04 cm RV S prime:     19.60 cm/s TAPSE (M-mode): 2.1 cm LEFT ATRIUM             Index LA diam:        4.10 cm 1.89 cm/m LA Vol (A2C):   95.3 ml 43.83 ml/m LA Vol (A4C):   66.2 ml 30.45 ml/m LA  Biplane Vol: 80.8 ml 37.16 ml/m  AORTIC VALVE LVOT Vmax:   112.00 cm/s LVOT Vmean:  79.100 cm/s LVOT VTI:    0.241 m  AORTA Ao Root diam: 3.10 cm MITRAL VALVE                TRICUSPID VALVE MV Area (PHT): 3.07 cm     TR Peak grad:   26.0 mmHg MV Decel Time: 247 msec     TR Vmax:        255.00 cm/s MV E velocity: 101.00 cm/s MV A velocity: 95.10 cm/s   SHUNTS MV E/A  ratio:  1.06         Systemic VTI:  0.24 m                             Systemic Diam: 2.00 cm Oswaldo Milian MD Electronically signed by Oswaldo Milian MD Signature Date/Time: 06/05/2019/10:46:22 PM    Final     CBC Recent Labs  Lab 06/07/19 0425 06/08/19 0354 06/09/19 0518 06/10/19 0606 06/11/19 0604  WBC 8.4 7.3 7.1 7.8 11.2*  HGB 7.6* 7.2* 7.1* 6.9* 8.2*  HCT 23.5* 22.2* 22.1* 21.4* 25.4*  PLT 317 287 299 282 305  MCV 92.9 93.7 94.0 94.7 91.4  MCH 30.0 30.4 30.2 30.5 29.5  MCHC 32.3 32.4 32.1 32.2 32.3  RDW 18.3* 18.2* 17.9* 17.9* 17.6*    Chemistries  Recent Labs  Lab 06/06/19 0420 06/07/19 0425 06/08/19 0354 06/09/19 0518 06/10/19 0606 06/11/19 0604 06/12/19 0640  NA 145   < > 145 144 142 142 143  K 2.7*   < > 2.7* 2.6* 2.5* 2.3* 2.3*  CL 120*   < > 118* 118* 119* 118* 119*  CO2 18*   < > 20* 18* 18* 17* 17*  GLUCOSE 108*   < > 115* 104* 111* 146* 96  BUN 22   < > 14 12 12 9  7*  CREATININE 0.99   < > 0.96 0.93 1.05 1.04 1.04  CALCIUM 6.9*   < > 7.0* 7.1* 7.0* 7.3* 7.1*  MG 1.9  --  1.7 1.8 1.6*  --  1.6*  AST 33  --  37 42*  --   --   --   ALT 27  --  23 24  --   --   --   ALKPHOS 89  --  90 92  --   --   --   BILITOT 0.7  --  0.5 0.5  --   --   --    < > = values in this interval not displayed.   ------------------------------------------------------------------------------------------------------------------ No results for input(s): CHOL, HDL, LDLCALC, TRIG, CHOLHDL, LDLDIRECT in the last 72 hours.  Lab Results  Component Value Date   HGBA1C 6.9 (H) 05/13/2018    ------------------------------------------------------------------------------------------------------------------ No results for input(s): TSH, T4TOTAL, T3FREE, THYROIDAB in the last 72 hours.  Invalid input(s): FREET3 ------------------------------------------------------------------------------------------------------------------ No results for input(s): VITAMINB12, FOLATE, FERRITIN, TIBC, IRON, RETICCTPCT in the last 72 hours.  Coagulation profile No results for input(s): INR, PROTIME in the last 168 hours.  No results for input(s): DDIMER in the last 72 hours.  Cardiac Enzymes No results for input(s): CKMB, TROPONINI, MYOGLOBIN in the last 168 hours.  Invalid input(s): CK ------------------------------------------------------------------------------------------------------------------    Component Value Date/Time   BNP 238.0 (H) 06/05/2019 5573   Roxan Hockey M.D on 06/12/2019 at 5:57 PM  Go to [email or web address] - for contact info  [facility] - Office  [phone]

## 2019-06-13 ENCOUNTER — Inpatient Hospital Stay (HOSPITAL_COMMUNITY): Payer: PPO

## 2019-06-13 DIAGNOSIS — K529 Noninfective gastroenteritis and colitis, unspecified: Secondary | ICD-10-CM | POA: Diagnosis present

## 2019-06-13 DIAGNOSIS — R933 Abnormal findings on diagnostic imaging of other parts of digestive tract: Secondary | ICD-10-CM | POA: Diagnosis not present

## 2019-06-13 DIAGNOSIS — R197 Diarrhea, unspecified: Secondary | ICD-10-CM | POA: Diagnosis not present

## 2019-06-13 LAB — CBC
HCT: 25.3 % — ABNORMAL LOW (ref 39.0–52.0)
Hemoglobin: 8.2 g/dL — ABNORMAL LOW (ref 13.0–17.0)
MCH: 29.2 pg (ref 26.0–34.0)
MCHC: 32.4 g/dL (ref 30.0–36.0)
MCV: 90 fL (ref 80.0–100.0)
Platelets: 293 10*3/uL (ref 150–400)
RBC: 2.81 MIL/uL — ABNORMAL LOW (ref 4.22–5.81)
RDW: 17.3 % — ABNORMAL HIGH (ref 11.5–15.5)
WBC: 10.9 10*3/uL — ABNORMAL HIGH (ref 4.0–10.5)
nRBC: 0 % (ref 0.0–0.2)

## 2019-06-13 LAB — BASIC METABOLIC PANEL
Anion gap: 7 (ref 5–15)
BUN: 6 mg/dL — ABNORMAL LOW (ref 8–23)
CO2: 17 mmol/L — ABNORMAL LOW (ref 22–32)
Calcium: 7.2 mg/dL — ABNORMAL LOW (ref 8.9–10.3)
Chloride: 118 mmol/L — ABNORMAL HIGH (ref 98–111)
Creatinine, Ser: 0.98 mg/dL (ref 0.61–1.24)
GFR calc Af Amer: >60 mL/min (ref >60)
GFR calc non Af Amer: >60 mL/min (ref >60)
Glucose, Bld: 96 mg/dL (ref 70–99)
Potassium: 2.4 mmol/L — CL (ref 3.5–5.1)
Sodium: 142 mmol/L (ref 135–145)

## 2019-06-13 LAB — C DIFFICILE QUICK SCREEN W PCR REFLEX
C Diff antigen: NEGATIVE
C Diff interpretation: NOT DETECTED
C Diff toxin: NEGATIVE

## 2019-06-13 LAB — MAGNESIUM: Magnesium: 1.9 mg/dL (ref 1.7–2.4)

## 2019-06-13 MED ORDER — POTASSIUM CHLORIDE 10 MEQ/100ML IV SOLN
10.0000 meq | INTRAVENOUS | Status: AC
  Administered 2019-06-13 (×4): 10 meq via INTRAVENOUS
  Filled 2019-06-13 (×3): qty 100

## 2019-06-13 MED ORDER — VANCOMYCIN 50 MG/ML ORAL SOLUTION
125.0000 mg | Freq: Three times a day (TID) | ORAL | Status: DC
  Administered 2019-06-13 – 2019-06-20 (×28): 125 mg via ORAL
  Filled 2019-06-13 (×38): qty 2.5

## 2019-06-13 MED ORDER — IOHEXOL 300 MG/ML  SOLN
100.0000 mL | Freq: Once | INTRAMUSCULAR | Status: AC | PRN
  Administered 2019-06-13: 100 mL via INTRAVENOUS

## 2019-06-13 MED ORDER — PANTOPRAZOLE SODIUM 40 MG PO TBEC
40.0000 mg | DELAYED_RELEASE_TABLET | Freq: Every day | ORAL | Status: DC
  Administered 2019-06-14 – 2019-06-20 (×7): 40 mg via ORAL
  Filled 2019-06-13 (×7): qty 1

## 2019-06-13 MED ORDER — IOHEXOL 9 MG/ML PO SOLN
500.0000 mL | ORAL | Status: AC
  Administered 2019-06-13 (×2): 500 mL via ORAL

## 2019-06-13 MED ORDER — POTASSIUM CHLORIDE CRYS ER 20 MEQ PO TBCR
40.0000 meq | EXTENDED_RELEASE_TABLET | ORAL | Status: AC
  Administered 2019-06-13 (×4): 40 meq via ORAL
  Filled 2019-06-13 (×4): qty 2

## 2019-06-13 MED ORDER — GERHARDT'S BUTT CREAM
TOPICAL_CREAM | Freq: Two times a day (BID) | CUTANEOUS | Status: DC
  Filled 2019-06-13: qty 1

## 2019-06-13 NOTE — Progress Notes (Signed)
Patient Demographics:    Darren Howard, is a 69 y.o. male, DOB - 23-Aug-1950, ZTI:[id number]  Admit date - 06/04/2019   Admitting Physician Kathie Dike, MD  Outpatient Primary MD for the patient is [name], MD  LOS - 8  Chief Complaint  Patient presents with  . Leg Swelling       Subjective:    Darren Howard today has no fevers, no emesis,  No chest pain,   -Liquid mucousy stool persist, -Severe electrolyte abnormalities persist despite replacement  Assessment  & Plan :    Principal Problem:   Colitis, acute---??? infectious pancolitis Active Problems:   Hypokalemia   Hypomagnesemia   Abnormal CT scan, colon   Hypertension   Normocytic anemia   DM2 (diabetes mellitus, type 2) (HCC)   Hypocalcemia   Alcohol abuse   Tobacco use   Pyelonephritis of right kidney   Pyelonephritis   Diarrhea   Urinary retention  Brief Summary:- 70 y.o. male with medical history significant of alcohol abuse, chronic normocytic anemia, medication and treatment noncompliance, history of duodenal reduction, erosive gastropathy, GI bleed, glucose intolerance/type 2 diabetes, hyperlipidemia, hypertension admitted on 06/05/2019 with possible right-sided pyelonephritis, as well as stercoral colitis and found to have alcohol withdrawal symptoms -Now with frequent mucousy stools recurrent significant hypokalemia and hypomagnesemia -CT abdomen and pelvis from 06/13/2019 with pancolitis  A/p 1)Pan- Colitis--- patient previously received iv Vancomycin, Rocephin and Cefepime , also treated with p.o. Augmentin and p.o. Flagyl during this admission --Continues to have numerous/frequent greenish mucousy stool---almost jelly appearing stools--- -initially C. difficile test and GI pathogen from 06/09/19 negative,  -CT abdomen and pelvis from 06/13/2019 reviewed with radiologist Dr. Thornton Papas, compared to CT from 06/04/2019  --patient now has moderate to severe colitis ranging from the proximal ascending colon, involving the transverse colon and extending into the descending, sigmoid and colon as well as the rectum (colonic wall thickening is a lot more pronounced) -Discussed with GI service recommends empiric treatment with oral Vanco for possible C. Difficile -Also recommends repeat GI pathogen and repeat stool sample for C. difficile testing -Continue Anusol AC for hemorrhoids  -WBC is around 11   2)Severe--- persistent electrolyte abnormalities including- hypocalcemia/hypokalemia/hypomagnesemia and hypoalbuminemia- hypokalemia and hypomagnesemia----due to frequent BMs/GI losses---replacing electrolytes -Potassium is down to 2.4, replace IV and orally again --At  risk for dehydration and severe electrolyte abnormalities given frequent stools- requires IV dextrose fluids  3)Alcohol Abuse with DTs--- DT  resolved,  --continue folic acid, thiamine/multivitamin  -Patient is coherent  4)DM2--last A1c 6.9 reflecting excellent DM control PTA -Metformin on hold Use Novolog/Humalog Sliding scale insulin with Accu-Cheks/Fingersticks as ordered   4)----due to GI losses the patient with frequent stools ---At  risk for dehydration and severe electrolyte abnormalities given frequent stools- requires IV dextrose fluids  5)HTN--stable, continue Coreg at reduced dose of 12.5 mg twice daily, continue to hold amlodipine  6) Urinary retention-- -Unable to void after removal of Foley catheter --Bladder scan over 500 Foley reinserted on 06/09/2019 -Flomax as ordered  7)Acute symptomatic blood loss anemia superimposed on chronic blood loss anemia ----admission hemoglobin was  6.5 , initially received 1 unit of blood on 06/06/2019, -no significant tachycardia at this time most likely due to Coreg use Hemoglobin up  to 8.2 after another 1 unit of packed cells on 06/10/2019 --Stool occult blood is positive, no overt GI bleed at  this time -GI consult appreciated recommends possible outpatient colonoscopy given poor prep with last colonoscopy and polyp removal at the time  Disposition/Need for in-Hospital Stay- patient unable to be discharged at this time due to --- frequent stools leading to severe electrolyte abnormalities and risk for dehydration requiring IV fluids and IV replacements of magnesium and potassium -Patient From: home  D/C Place: home  Barriers: Not Clinically Stable- --- persistent diarrhea --- high risk for dehydration and severe electrolyte abnormalities given frequency of stools--- potassium is down to 2.4  despite IV and oral replacement  Code Status : full  Family Communication:   -Discussed with son [name]   Consults  : GI   DVT Prophylaxis  :   - SCDs   Lab Results  Component Value Date   PLT 293 06/13/2019   Inpatient Medications  Scheduled Meds: . carvedilol  12.5 mg Oral BID  . Chlorhexidine Gluconate Cloth  6 each Topical Daily  . folic acid  1 mg Oral Daily  . Gerhardt's butt cream   Topical BID  . hydrocortisone   Rectal QID  . hydrocortisone-pramoxine  1 applicator Rectal BID  . multivitamin with minerals  1 tablet Oral Daily  . [START ON 06/14/2019] pantoprazole  40 mg Oral Daily  . tamsulosin  0.4 mg Oral BID  . thiamine  100 mg Oral Daily   Or  . thiamine  100 mg Intravenous Daily  . vancomycin  125 mg Oral TID WC & HS   Continuous Infusions: . dextrose 5 % and 0.45 % NaCl with KCl 20 mEq/L 30 mL/hr at 06/11/19 1817   PRN Meds:.traMADol  Anti-infectives (From admission, onward)   Start     Dose/Rate Route Frequency Ordered Stop   06/13/19 1515  vancomycin (VANCOCIN) 50 mg/mL oral solution 125 mg     125 mg Oral 3 times daily with meals & bedtime 06/13/19 1508     06/09/19 1800  metroNIDAZOLE (FLAGYL) tablet 500 mg     500 mg Oral 3 times daily 06/09/19 1745 06/12/19 2112   06/07/19 2130  rifaximin (XIFAXAN) tablet 550 mg  Status:  Discontinued     550 mg Oral  2 times daily 06/07/19 2119 06/13/19 1502   06/06/19 2200  amoxicillin-clavulanate (AUGMENTIN) 875-125 MG per tablet 1 tablet     1 tablet Oral Every 12 hours 06/06/19 1822 06/12/19 2112   06/05/19 0645  metroNIDAZOLE (FLAGYL) IVPB 500 mg     500 mg 100 mL/hr over 60 Minutes Intravenous  Once 06/05/19 0640 06/05/19 0954   06/05/19 0600  ceFEPIme (MAXIPIME) 2 g in sodium chloride 0.9 % 100 mL IVPB  Status:  Discontinued     2 g 200 mL/hr over 30 Minutes Intravenous Every 8 hours 06/05/19 0217 06/05/19 0357   06/05/19 0400  cefTRIAXone (ROCEPHIN) 1 g in sodium chloride 0.9 % 100 mL IVPB  Status:  Discontinued     1 g 200 mL/hr over 30 Minutes Intravenous Every 24 hours 06/05/19 0357 06/06/19 1822   06/04/19 2130  vancomycin (VANCOREADY) IVPB 2000 mg/400 mL     2,000 mg 200 mL/hr over 120 Minutes Intravenous  Once 06/04/19 2130 06/05/19 0216   06/04/19 2115  ceFEPIme (MAXIPIME) 2 g in sodium chloride 0.9 % 100 mL IVPB     2 g 200 mL/hr over 30 Minutes Intravenous  Once 06/04/19  2105 06/04/19 2226   06/04/19 2115  metroNIDAZOLE (FLAGYL) IVPB 500 mg     500 mg 100 mL/hr over 60 Minutes Intravenous  Once 06/04/19 2105 06/05/19 0028   06/04/19 2115  vancomycin (VANCOCIN) IVPB 1000 mg/200 mL premix  Status:  Discontinued     1,000 mg 200 mL/hr over 60 Minutes Intravenous  Once 06/04/19 2105 06/04/19 2130        Objective:   Vitals:   06/13/19 0529 06/13/19 0600 06/13/19 1312 06/13/19 1506  BP: (!) 148/76  (!) 144/71   Pulse: 73  76   Resp: 18  18   Temp: (!) 100.4 F (38 C) 98.9 F (37.2 C) 98.8 F (37.1 C)   TempSrc: Oral  Oral   SpO2: 100%  100% 97%  Weight:      Height:        Wt Readings from Last 3 Encounters:  06/08/19 98.7 kg  05/31/18 100 kg  05/17/18 93.5 kg    Intake/Output Summary (Last 24 hours) at 06/13/2019 1742 Last data filed at 06/13/2019 1300 Gross per 24 hour  Intake 480 ml  Output 1000 ml  Net -520 ml   Physical Exam  Gen:-coherent, in no acute  distress HEENT:- [place].AT, No sclera icterus Neck-Supple Neck,No JVD,.  Lungs-  CTAB , fair symmetrical air movement CV- S1, S2 normal, regular ,  Abd-  +ve B.Sounds, Abd Soft, No tenderness, no CVA area tenderness Extremity/Skin:- No  edema, pedal pulses present  Psych-alert and oriented mostly,  neuro-generalized weakness, no new focal deficits,  GU-Foley in situ   Data Review:   Micro Results Recent Results (from the past 240 hour(s))  Culture, blood (routine x 2)     Status: None   Collection Time: 06/04/19  8:05 PM   Specimen: Left Antecubital; Blood  Result Value Ref Range Status   Specimen Description LEFT ANTECUBITAL  Final   Special Requests   Final    BOTTLES DRAWN AEROBIC AND ANAEROBIC Blood Culture adequate volume   Culture   Final    NO GROWTH 5 DAYS Performed at [facility], [address] [place], [place] [address]    Report Status 06/09/2019 FINAL  Final  Urine culture     Status: None   Collection Time: 06/04/19  8:12 PM   Specimen: Urine, Catheterized  Result Value Ref Range Status   Specimen Description   Final    URINE, CATHETERIZED Performed at [facility], [address] [place], [place] [address]    Special Requests   Final    NONE Performed at [facility], [address], [place], [place] [address]    Culture   Final    NO GROWTH Performed at [facility], [place] [address], [place], [place] [address]    Report Status 06/07/2019 FINAL  Final  Culture, blood (routine x 2)     Status: None   Collection Time: 06/04/19  8:49 PM   Specimen: Right Antecubital; Blood  Result Value Ref Range Status   Specimen Description RIGHT ANTECUBITAL  Final   Special Requests   Final    BOTTLES DRAWN AEROBIC AND ANAEROBIC Blood Culture adequate volume   Culture   Final    NO GROWTH 5 DAYS Performed at [facility], [address], [place], [place] [address]    Report Status 06/09/2019 FINAL  Final  MRSA PCR Screening     Status: None   Collection Time:  06/05/19  6:46 PM   Specimen: Nasopharyngeal  Result Value Ref Range Status   MRSA by PCR NEGATIVE NEGATIVE Final    Comment:        The GeneXpert MRSA Assay (FDA approved for NASAL specimens only), is one component of a comprehensive MRSA colonization surveillance program. It is not intended to diagnose MRSA infection nor to guide or monitor treatment for MRSA infections. Performed at [facility], [address] [place], [place] [address]   Respiratory Panel by RT PCR (Flu A&B, Covid) - Nasopharyngeal Swab     Status: None   Collection Time: 06/06/19  2:51 AM   Specimen: Nasopharyngeal Swab  Result Value Ref Range Status   SARS Coronavirus 2 by RT PCR NEGATIVE NEGATIVE Final    Comment: (NOTE) SARS-CoV-2 target nucleic acids are NOT DETECTED. The SARS-CoV-2 RNA is generally detectable in upper respiratoy specimens during the acute phase of infection. The lowest concentration of SARS-CoV-2 viral copies this assay can detect is 131 copies/mL. A negative result does not preclude SARS-Cov-2 infection and should not be used as the sole basis for treatment or other patient management decisions. A negative result may occur with  improper specimen collection/handling, submission of specimen other than nasopharyngeal swab, presence of viral mutation(s) within the areas targeted by this assay, and inadequate number of viral copies (<131 copies/mL). A negative result must be combined with clinical observations, patient history, and epidemiological information. The expected result is Negative. Fact Sheet for Patients:  PinkCheek.be Fact Sheet for Healthcare Providers:  GravelBags.it This test is not yet ap proved or cleared by the [place] FDA and  has been authorized for detection and/or diagnosis of SARS-CoV-2 by FDA under an Emergency Use Authorization (EUA). This EUA will remain  in effect (meaning this test can be  used) for the duration of the COVID-19 declaration under Section 564(b)(1) of the Act, 21 U.S.C. section 360bbb-3(b)(1), unless the authorization is terminated or revoked sooner.    Influenza A by PCR NEGATIVE NEGATIVE Final   Influenza B by PCR NEGATIVE NEGATIVE Final    Comment: (NOTE) The Xpert Xpress SARS-CoV-2/FLU/RSV assay is intended as an aid in  the diagnosis of influenza from Nasopharyngeal swab specimens and  should not be used as a sole basis for treatment. Nasal washings and  aspirates are unacceptable for Xpert Xpress SARS-CoV-2/FLU/RSV  testing. Fact Sheet for Patients: PinkCheek.be Fact Sheet for Healthcare Providers: GravelBags.it This test is not yet approved or cleared by the [place] FDA and  has been authorized for detection and/or diagnosis of SARS-CoV-2 by  FDA under an Emergency Use Authorization (EUA). This EUA will remain  in effect (meaning this test can be used) for the duration of the  Covid-19 declaration under Section 564(b)(1) of the Act, 21  U.S.C. section 360bbb-3(b)(1), unless the authorization is  terminated or revoked. Performed at [facility], [address], [place], [place] [address]   Gastrointestinal Panel by PCR , Stool     Status: None   Collection Time: 06/09/19  2:12 PM   Specimen: Stool  Result Value Ref Range Status   Campylobacter species NOT DETECTED NOT DETECTED Final   Plesimonas shigelloides NOT DETECTED NOT DETECTED Final   Salmonella species NOT DETECTED NOT DETECTED Final   Yersinia enterocolitica NOT DETECTED NOT DETECTED Final   Vibrio species NOT DETECTED NOT DETECTED Final   Vibrio cholerae NOT DETECTED NOT DETECTED Final   Enteroaggregative E coli (EAEC) NOT DETECTED NOT DETECTED Final   Enteropathogenic E coli (EPEC) NOT DETECTED NOT DETECTED Final  Enterotoxigenic E coli (ETEC) NOT DETECTED NOT DETECTED Final   Shiga like toxin producing E coli (STEC)  NOT DETECTED NOT DETECTED Final   Shigella/Enteroinvasive E coli (EIEC) NOT DETECTED NOT DETECTED Final   Cryptosporidium NOT DETECTED NOT DETECTED Final   Cyclospora cayetanensis NOT DETECTED NOT DETECTED Final   Entamoeba histolytica NOT DETECTED NOT DETECTED Final   Giardia lamblia NOT DETECTED NOT DETECTED Final   Adenovirus F40/41 NOT DETECTED NOT DETECTED Final   Astrovirus NOT DETECTED NOT DETECTED Final   Norovirus GI/GII NOT DETECTED NOT DETECTED Final   Rotavirus A NOT DETECTED NOT DETECTED Final   Sapovirus (I, II, IV, and V) NOT DETECTED NOT DETECTED Final    Comment: Performed at [facility], [address] [place], [place] [address]  C Difficile Quick Screen (NO PCR Reflex)     Status: None   Collection Time: 06/09/19  2:13 PM   Specimen: Stool  Result Value Ref Range Status   C Diff antigen NEGATIVE NEGATIVE Final   C Diff toxin NEGATIVE NEGATIVE Final   C Diff interpretation No C. difficile detected.  Final    Comment: Performed at [facility], [address], [place], [place] [address]    Radiology Reports CT ABDOMEN PELVIS W CONTRAST  Result Date: 06/13/2019 CLINICAL DATA:  Diarrhea. EXAM: CT ABDOMEN AND PELVIS WITH CONTRAST TECHNIQUE: Multidetector CT imaging of the abdomen and pelvis was performed using the standard protocol following bolus administration of intravenous contrast. CONTRAST:  181mL OMNIPAQUE IOHEXOL 300 MG/ML  SOLN COMPARISON:  June 04, 2019. FINDINGS: Lower chest: Mild bilateral pleural effusions are noted with adjacent subsegmental atelectasis. Hepatobiliary: Hepatic steatosis is noted. No gallstones are noted. No biliary dilatation is noted. Pancreas: Unremarkable. No pancreatic ductal dilatation or surrounding inflammatory changes. Spleen: Normal in size without focal abnormality. Adrenals/Urinary Tract: Adrenal glands appear normal. Right renal cysts are noted. No renal or ureteral calculi are noted. Minimal left hydroureteronephrosis is  noted without obstructing calculus. Urinary bladder is decompressed secondary to Foley catheter. Stomach/Bowel: The stomach appears normal. The appendix is not visualized. Moderate to severe wall thickening is seen involving the transverse, descending and sigmoid colon and rectum concerning for inflammatory or infectious colitis. There is no evidence of bowel obstruction. Vascular/Lymphatic: No significant vascular findings are present. No enlarged abdominal or pelvic lymph nodes. Reproductive: Prostate is unremarkable. Other: No abdominal wall hernia or abnormality. No abdominopelvic ascites. Musculoskeletal: No acute or significant osseous findings. IMPRESSION: 1. Hepatic steatosis. 2. Mild bilateral pleural effusions are noted with adjacent subsegmental atelectasis. 3. Minimal left hydroureteronephrosis is noted without obstructing calculus. 4. Moderate to severe wall thickening is seen involving the transverse, descending and sigmoid colon and rectum concerning for inflammatory or infectious colitis. Electronically Signed   By: Marijo Conception M.D.   On: 06/13/2019 12:44   CT Abdomen Pelvis W Contrast  Result Date: 06/04/2019 CLINICAL DATA:  69 year old male with abdominal distension and diarrhea. Concern for acute diverticulitis. EXAM: CT ABDOMEN AND PELVIS WITH CONTRAST TECHNIQUE: Multidetector CT imaging of the abdomen and pelvis was performed using the standard protocol following bolus administration of intravenous contrast. CONTRAST:  156mL OMNIPAQUE IOHEXOL 300 MG/ML  SOLN COMPARISON:  None. FINDINGS: Lower chest: The visualized lung bases are clear. No intra-abdominal free air or free fluid. Hepatobiliary: Severe fatty infiltration of the liver. No intrahepatic biliary ductal dilatation. The gallbladder is unremarkable. Pancreas: Atrophic pancreas. No Accu to inflammatory changes. No dilatation of the main pancreatic duct. Spleen: Normal in size without  focal abnormality. Adrenals/Urinary Tract: The  adrenal glands are unremarkable. There is mild bilateral hydronephrosis, right greater left. There is enhancement of the right renal urothelium concerning for pyelonephritis. Correlation with urinalysis recommended. There is slight delayed enhancement of the right renal parenchyma. Multiple right renal cysts measure up to 4 cm in the interpolar aspect of the right kidney. Additional subcentimeter right renal hypodense lesions are too small to characterize. The urinary bladder is distended. Stomach/Bowel: There is narrowing of a segment of the rectosigmoid, likely related to mass effect caused by distended urinary bladder. There is mild thickened appearance of the segment of colon with surrounding stranding. The perisigmoid stranding may be related to UTI but concerning for stercoral colitis. Clinical correlation is recommended. There is probable mild associate luminal narrowing of the sigmoid colon. No evidence of small-bowel obstruction. The appendix is not visualized with certainty. No inflammatory changes identified in the right lower quadrant. Vascular/Lymphatic: The abdominal aorta and IVC unremarkable. No portal venous gas. There is no adenopathy. Reproductive: The prostate and seminal vesicles are grossly unremarkable. Partially visualized fluid within the left inguinal canal likely related to hydrocele. Other: Mild diffuse subcutaneous edema. Musculoskeletal: Degenerative changes of the spine. No acute osseous pathology. IMPRESSION: 1. Distended urinary bladder with mild bilateral hydronephrosis and findings of right-sided pyelonephritis. Correlation with urinalysis recommended. 2. Thickened appearance of the rectosigmoid with surrounding stranding which may be related to UTI but concerning for stercoral colitis. Clinical correlation is recommended. 3. Severe fatty liver. Electronically Signed   By: Anner Crete M.D.   On: 06/04/2019 23:41   US Venous Img Lower Bilateral (DVT)  Result Date:  06/05/2019 CLINICAL DATA:  Bilateral lower extremity pain and edema for 1 month. EXAM: BILATERAL LOWER EXTREMITY VENOUS DOPPLER ULTRASOUND TECHNIQUE: Gray-scale sonography with compression, as well as color and duplex ultrasound, were performed to evaluate the deep venous system(s) from the level of the common femoral vein through the popliteal and proximal calf veins. COMPARISON:  None. FINDINGS: VENOUS Normal compressibility of the common femoral, superficial femoral, and popliteal veins, as well as the visualized calf veins. Visualized portions of profunda femoral vein and great saphenous vein unremarkable. No filling defects to suggest DVT on grayscale or color Doppler imaging. Doppler waveforms show normal direction of venous flow, normal respiratory phasicity and response to augmentation. OTHER None. Limitations: none IMPRESSION: No femoropopliteal DVT nor evidence of DVT within the visualized calf veins. If clinical symptoms are inconsistent or if there are persistent or worsening symptoms, further imaging (possibly involving the iliac veins) may be warranted. Electronically Signed   By: Abigail Miyamoto M.D.   On: 06/05/2019 11:44   DG Chest Port 1 View  Result Date: 06/04/2019 CLINICAL DATA:  Tachycardia, bilateral lower extremity edema EXAM: PORTABLE CHEST 1 VIEW COMPARISON:  05/13/2018 FINDINGS: The heart size and mediastinal contours are within normal limits. Both lungs are clear. The visualized skeletal structures are unremarkable. IMPRESSION: No active disease. Electronically Signed   By: Randa Ngo M.D.   On: 06/04/2019 20:32   DG Abd 2 Views  Result Date: 06/11/2019 CLINICAL DATA:  Abdominal pain. EXAM: ABDOMEN - 2 VIEW COMPARISON:  None. FINDINGS: No free air, portal venous gas, or pneumatosis. Numerous air-filled mildly prominent but nondilated loops of small bowel. No gas in the region of the rectum. No other acute abnormalities. IMPRESSION: 1. Numerous mildly prominent loops of small  bowel without significant dilatation. Developing ileus favored. Very early small bowel obstruction not definitely excluded despite the lack of definitive  dilatation as no definitive colonic gas is noted. Recommend clinical correlation and attention on follow-up. Electronically Signed   By: Dorise Bullion III M.D   On: 06/11/2019 13:29   ECHOCARDIOGRAM COMPLETE  Result Date: 06/05/2019    ECHOCARDIOGRAM REPORT   Patient Name:   TEHRAN RABENOLD Date of Exam: 06/05/2019 Medical Rec #:  [id number]     Height:       73.0 in Accession #:    [id number]    Weight:       205.0 lb Date of Birth:  1950-11-18    BSA:          2.174 m Patient Age:    69 years      BP:           152/86 mmHg Patient Gender: M             HR:           90 bpm. Exam Location:  [facility] Procedure: 2D Echo, Cardiac Doppler and Color Doppler Indications:    Lower extremity edema [218969]                 Alcohol abuse [401027]  History:        Patient has no prior history of Echocardiogram examinations.                 Risk Factors:Hypertension, Diabetes, Current Smoker and                 Dyslipidemia.  Sonographer:    Alvino Chapel RCS Referring Phys: [place]  1. Left ventricular ejection fraction, by estimation, is 60 to 65%. The left ventricle has normal function. The left ventricle has no regional wall motion abnormalities. There is moderate left ventricular hypertrophy. Left ventricular diastolic parameters were normal.  2. Right ventricular systolic function is normal. The right ventricular size is normal.  3. Left atrial size was mildly dilated.  4. The mitral valve is normal in structure. No evidence of mitral valve regurgitation.  5. The aortic valve is tricuspid. Aortic valve regurgitation is not visualized. No aortic stenosis is present. FINDINGS  Left Ventricle: Left ventricular ejection fraction, by estimation, is 60 to 65%. The left ventricle has normal function. The left ventricle has no regional wall motion  abnormalities. The left ventricular internal cavity size was normal in size. There is  moderate left ventricular hypertrophy. Left ventricular diastolic parameters were normal. Right Ventricle: The right ventricular size is normal. Right vetricular wall thickness was not assessed. Right ventricular systolic function is normal. There is mildly elevated pulmonary artery systolic pressure. The tricuspid regurgitant velocity is 2.55 m/s, and with an assumed right atrial pressure of 8 mmHg, the estimated right ventricular systolic pressure is 25.3 mmHg. Left Atrium: Left atrial size was mildly dilated. Right Atrium: Right atrial size was normal in size. Pericardium: Trivial pericardial effusion is present. Presence of pericardial fat pad. Mitral Valve: The mitral valve is normal in structure. No evidence of mitral valve regurgitation. Tricuspid Valve: The tricuspid valve is normal in structure. Tricuspid valve regurgitation is trivial. Aortic Valve: The aortic valve is tricuspid. Aortic valve regurgitation is not visualized. No aortic stenosis is present. Pulmonic Valve: The pulmonic valve was not well visualized. Pulmonic valve regurgitation is not visualized. Aorta: The aortic root is normal in size and structure. IAS/Shunts: The interatrial septum was not well visualized.  LEFT VENTRICLE PLAX 2D LVIDd:         4.85 cm  Diastology LVIDs:         3.13 cm  LV e' lateral:   10.80 cm/s LV PW:         1.35 cm  LV E/e' lateral: 9.4 LV IVS:        1.41 cm  LV e' medial:    9.03 cm/s LVOT diam:     2.00 cm  LV E/e' medial:  11.2 LV SV:         76 LV SV Index:   35 LVOT Area:     3.14 cm  RIGHT VENTRICLE RV Mid diam:    3.04 cm RV S prime:     19.60 cm/s TAPSE (M-mode): 2.1 cm LEFT ATRIUM             Index LA diam:        4.10 cm 1.89 cm/m LA Vol (A2C):   95.3 ml 43.83 ml/m LA Vol (A4C):   66.2 ml 30.45 ml/m LA Biplane Vol: 80.8 ml 37.16 ml/m  AORTIC VALVE LVOT Vmax:   112.00 cm/s LVOT Vmean:  79.100 cm/s LVOT VTI:     0.241 m  AORTA Ao Root diam: 3.10 cm MITRAL VALVE                TRICUSPID VALVE MV Area (PHT): 3.07 cm     TR Peak grad:   26.0 mmHg MV Decel Time: 247 msec     TR Vmax:        255.00 cm/s MV E velocity: 101.00 cm/s MV A velocity: 95.10 cm/s   SHUNTS MV E/A ratio:  1.06         Systemic VTI:  0.24 m                             Systemic Diam: 2.00 cm Oswaldo Milian MD Electronically signed by Oswaldo Milian MD Signature Date/Time: 06/05/2019/10:46:22 PM    Final     CBC Recent Labs  Lab 06/08/19 0354 06/09/19 0518 06/10/19 0606 06/11/19 0604 06/13/19 0518  WBC 7.3 7.1 7.8 11.2* 10.9*  HGB 7.2* 7.1* 6.9* 8.2* 8.2*  HCT 22.2* 22.1* 21.4* 25.4* 25.3*  PLT 287 299 282 305 293  MCV 93.7 94.0 94.7 91.4 90.0  MCH 30.4 30.2 30.5 29.5 29.2  MCHC 32.4 32.1 32.2 32.3 32.4  RDW 18.2* 17.9* 17.9* 17.6* 17.3*    Chemistries  Recent Labs  Lab 06/08/19 0354 06/08/19 0354 06/09/19 0518 06/10/19 0606 06/11/19 0604 06/12/19 0640 06/13/19 0518  NA 145   < > 144 142 142 143 142  K 2.7*   < > 2.6* 2.5* 2.3* 2.3* 2.4*  CL 118*   < > 118* 119* 118* 119* 118*  CO2 20*   < > 18* 18* 17* 17* 17*  GLUCOSE 115*   < > 104* 111* 146* 96 96  BUN 14   < > 12 12 9  7* 6*  CREATININE 0.96   < > 0.93 1.05 1.04 1.04 0.98  CALCIUM 7.0*   < > 7.1* 7.0* 7.3* 7.1* 7.2*  MG 1.7  --  1.8 1.6*  --  1.6* 1.9  AST 37  --  42*  --   --   --   --   ALT 23  --  24  --   --   --   --   ALKPHOS 90  --  92  --   --   --   --  BILITOT 0.5  --  0.5  --   --   --   --    < > = values in this interval not displayed.   ------------------------------------------------------------------------------------------------------------------ No results for input(s): CHOL, HDL, LDLCALC, TRIG, CHOLHDL, LDLDIRECT in the last 72 hours.  Lab Results  Component Value Date   HGBA1C 6.9 (H) 05/13/2018   ------------------------------------------------------------------------------------------------------------------ No  results for input(s): TSH, T4TOTAL, T3FREE, THYROIDAB in the last 72 hours.  Invalid input(s): FREET3 ------------------------------------------------------------------------------------------------------------------ No results for input(s): VITAMINB12, FOLATE, FERRITIN, TIBC, IRON, RETICCTPCT in the last 72 hours.  Coagulation profile No results for input(s): INR, PROTIME in the last 168 hours.  No results for input(s): DDIMER in the last 72 hours.  Cardiac Enzymes No results for input(s): CKMB, TROPONINI, MYOGLOBIN in the last 168 hours.  Invalid input(s): CK ------------------------------------------------------------------------------------------------------------------    Component Value Date/Time   BNP 238.0 (H) 06/05/2019 3967   Roxan Hockey M.D on 06/13/2019 at 5:42 PM  Go to [email or web address] - for contact info  [facility] - Office  [phone]

## 2019-06-13 NOTE — Progress Notes (Signed)
Subjective: Reports 2-3 stools but nursing state he had numerous overnight. This morning so far 1 large liquid yellow stool with some pieces in it. Fecal soilage/seepage mucus-like. No bleeding. No abdominal pain. No N/V. No rectal pain. +flatus. Tolerating diet. Good appetite per patient. Feels like lower extremity edema improved and nearing baseline as outpatient.   Objective: Vital signs in last 24 hours: Temp:  [98.9 F (37.2 C)-100.4 F (38 C)] 98.9 F (37.2 C) (04/26 0600) Pulse Rate:  [73-79] 73 (04/26 0529) Resp:  [18-20] 18 (04/26 0529) BP: (137-148)/(73-81) 148/76 (04/26 0529) SpO2:  [100 %] 100 % (04/26 0529) Last BM Date: 06/12/19 General:   Alert and oriented, pleasant Head:  Normocephalic and atraumatic. Abdomen:  Bowel sounds present, distended but soft, no rebound or guarding.  Extremities:  Improved pedal edema bilaterally,  Neurologic:  Alert and  oriented x4 Psych:  Alert and cooperative. Normal mood and affect.  Intake/Output from previous day: 04/25 0701 - 04/26 0700 In: 400 [IV Piggyback:400] Out: 2125 [Urine:2125] Intake/Output this shift: No intake/output data recorded.  Lab Results: Recent Labs    06/11/19 0604 06/13/19 0518  WBC 11.2* 10.9*  HGB 8.2* 8.2*  HCT 25.4* 25.3*  PLT 305 293   BMET Recent Labs    06/11/19 0604 06/12/19 0640 06/13/19 0518  NA 142 143 142  K 2.3* 2.3* 2.4*  CL 118* 119* 118*  CO2 17* 17* 17*  GLUCOSE 146* 96 96  BUN 9 7* 6*  CREATININE 1.04 1.04 0.98  CALCIUM 7.3* 7.1* 7.2*   LFT Recent Labs    06/11/19 0604  ALBUMIN 2.0*    Studies/Results: DG Abd 2 Views  Result Date: 06/11/2019 CLINICAL DATA:  Abdominal pain. EXAM: ABDOMEN - 2 VIEW COMPARISON:  None. FINDINGS: No free air, portal venous gas, or pneumatosis. Numerous air-filled mildly prominent but nondilated loops of small bowel. No gas in the region of the rectum. No other acute abnormalities. IMPRESSION: 1. Numerous mildly prominent loops of  small bowel without significant dilatation. Developing ileus favored. Very early small bowel obstruction not definitely excluded despite the lack of definitive dilatation as no definitive colonic gas is noted. Recommend clinical correlation and attention on follow-up. Electronically Signed   By: Dorise Bullion III M.D   On: 06/11/2019 13:29    Assessment: 69 year old male with history of alcohol abuse,  chronic anemia with blood transfusions on several prior occasions during hospitalization last year, admitted with right-sided pyelonephritis and findings of possible stercoral colitis on CT abd/pelvis with contrast but more likely result of bladder distension with impingement on colon, persistent hypokalemia in setting of multiple watery stools.   Acute on chronic anemia: heme positive but without overt GI bleeding. Received 2 units PRBCs during stay, with Hgb stable at 8.2, baseline for patient. EGD last year with erosive gastropathy, duodenal erosions, likely NSAID effect. Colonoscopy with inadequate prep April 2020 and large polyp piecemeal removed, overdue for early interval surveillance.   Numerous stools and fecal soilage/seepage: Cdiff and GI pathogen panel negative. Linzess 145 mcg daily likely overshooting the mark. He has known Grade 3 hemorrhoids, contributing to fecal seepage. Received Linzess this morning. Will discontinue for now and monitor response.   Hypokalemia: severe and persistent in setting of diarrhea. Hold Linzess.     Plan:  Discontinue Linzess (last dose this morning)  Follow H/H  For now, outpatient colonoscopy unless diarrhea persists despite holding Linzess  Follow-up on pending CT  Continue with Anusol   PPI BID  before meals   Annitta Needs, PhD, ANP-BC [facility]      LOS: 8 days    06/13/2019, 9:03 AM

## 2019-06-13 NOTE — Care Management Important Message (Signed)
Important Message  Patient Details  Name: Darren Howard MRN: [id number] Date of Birth: 1950-09-01   Medicare Important Message Given:  Yes(Cicely, RN was asked to deliver letter due to precautions)     [name] 06/13/2019, 4:36 PM

## 2019-06-14 ENCOUNTER — Encounter (HOSPITAL_COMMUNITY): Payer: Self-pay | Admitting: Internal Medicine

## 2019-06-14 DIAGNOSIS — K529 Noninfective gastroenteritis and colitis, unspecified: Secondary | ICD-10-CM

## 2019-06-14 LAB — GASTROINTESTINAL PANEL BY PCR, STOOL (REPLACES STOOL CULTURE)

## 2019-06-14 LAB — COMPREHENSIVE METABOLIC PANEL
ALT: 15 U/L (ref 0–44)
AST: 26 U/L (ref 15–41)
Albumin: 2 g/dL — ABNORMAL LOW (ref 3.5–5.0)
Alkaline Phosphatase: 97 U/L (ref 38–126)
Anion gap: 6 (ref 5–15)
BUN: 5 mg/dL — ABNORMAL LOW (ref 8–23)
CO2: 16 mmol/L — ABNORMAL LOW (ref 22–32)
Calcium: 7.2 mg/dL — ABNORMAL LOW (ref 8.9–10.3)
Chloride: 118 mmol/L — ABNORMAL HIGH (ref 98–111)
Creatinine, Ser: 0.88 mg/dL (ref 0.61–1.24)
GFR calc Af Amer: >60 mL/min (ref >60)
GFR calc non Af Amer: >60 mL/min (ref >60)
Glucose, Bld: 104 mg/dL — ABNORMAL HIGH (ref 70–99)
Potassium: 2.6 mmol/L — CL (ref 3.5–5.1)
Sodium: 140 mmol/L (ref 135–145)
Total Bilirubin: 0.7 mg/dL (ref 0.3–1.2)
Total Protein: 5.2 g/dL — ABNORMAL LOW (ref 6.5–8.1)

## 2019-06-14 LAB — CBC
HCT: 25.7 % — ABNORMAL LOW (ref 39.0–52.0)
Hemoglobin: 8.5 g/dL — ABNORMAL LOW (ref 13.0–17.0)
MCH: 29.5 pg (ref 26.0–34.0)
MCHC: 33.1 g/dL (ref 30.0–36.0)
MCV: 89.2 fL (ref 80.0–100.0)
Platelets: 282 10*3/uL (ref 150–400)
RBC: 2.88 MIL/uL — ABNORMAL LOW (ref 4.22–5.81)
RDW: 17.2 % — ABNORMAL HIGH (ref 11.5–15.5)
WBC: 10 10*3/uL (ref 4.0–10.5)
nRBC: 0 % (ref 0.0–0.2)

## 2019-06-14 LAB — MAGNESIUM: Magnesium: 1.7 mg/dL (ref 1.7–2.4)

## 2019-06-14 LAB — GLUCOSE, CAPILLARY: Glucose-Capillary: 114 mg/dL — ABNORMAL HIGH (ref 70–99)

## 2019-06-14 MED ORDER — POTASSIUM CHLORIDE 10 MEQ/100ML IV SOLN
10.0000 meq | INTRAVENOUS | Status: AC
  Administered 2019-06-14 – 2019-06-15 (×6): 10 meq via INTRAVENOUS
  Filled 2019-06-14 (×6): qty 100

## 2019-06-14 MED ORDER — AMLODIPINE BESYLATE 5 MG PO TABS
5.0000 mg | ORAL_TABLET | Freq: Every day | ORAL | Status: DC
  Administered 2019-06-14 – 2019-06-20 (×7): 5 mg via ORAL
  Filled 2019-06-14 (×7): qty 1

## 2019-06-14 MED ORDER — MAGNESIUM SULFATE 4 GM/100ML IV SOLN
4.0000 g | Freq: Once | INTRAVENOUS | Status: AC
  Administered 2019-06-14: 4 g via INTRAVENOUS
  Filled 2019-06-14: qty 100

## 2019-06-14 MED ORDER — POTASSIUM CHLORIDE 10 MEQ/100ML IV SOLN
10.0000 meq | INTRAVENOUS | Status: AC
  Administered 2019-06-14 (×6): 10 meq via INTRAVENOUS
  Filled 2019-06-14 (×5): qty 100

## 2019-06-14 MED ORDER — POTASSIUM CHLORIDE 10 MEQ/100ML IV SOLN: 10.0000 meq | INTRAVENOUS | Status: DC

## 2019-06-14 MED ORDER — POTASSIUM CHLORIDE CRYS ER 20 MEQ PO TBCR
40.0000 meq | EXTENDED_RELEASE_TABLET | ORAL | Status: AC
  Administered 2019-06-14 (×4): 40 meq via ORAL
  Filled 2019-06-14 (×4): qty 2

## 2019-06-14 NOTE — Progress Notes (Signed)
Patient Demographics:    Darren Howard, is a 69 y.o. male, DOB - 03-19-1950, OEV:[id number]  Admit date - 06/04/2019   Admitting Physician Kathie Dike, MD  Outpatient Primary MD for the patient is [name], MD  LOS - 9  Chief Complaint  Patient presents with   Leg Swelling       Subjective:    Darren Howard today has no fevers, no emesis,  No chest pain,   Today since 7 am until 1430 patient has had at least 5 medium to large mucousy stool -Patient had 3 stools overnight as well for a total of 8 medium to large liquid stools in the last 15 hours ---Patient is averaging over 10 bowel movements every 24 hours for the last several days ---Severe electrolyte abnormalities persist despite replacement due to persistent diarrhea  Assessment  & Plan :    Principal Problem:   Colitis, acute---??? infectious pancolitis Active Problems:   Hypokalemia   Hypomagnesemia   Abnormal CT scan, colon   Hypertension   Normocytic anemia   DM2 (diabetes mellitus, type 2) (HCC)   Hypocalcemia   Alcohol abuse   Tobacco use   Pyelonephritis of right kidney   Pyelonephritis   Diarrhea   Urinary retention  Brief Summary:- 69 y.o. male with medical history significant of alcohol abuse, chronic normocytic anemia, medication and treatment noncompliance, history of duodenal reduction, erosive gastropathy, GI bleed, glucose intolerance/type 2 diabetes, hyperlipidemia, hypertension admitted on 06/05/2019 with possible right-sided pyelonephritis, as well as stercoral colitis and found to have alcohol withdrawal symptoms -Now with frequent mucousy stools recurrent significant hypokalemia and hypomagnesemia -CT abdomen and pelvis from 06/13/2019 with pancolitis ----Patient is averaging over 10 bowel movements every 24 hours for the last several days ---Severe electrolyte abnormalities persist despite replacement due  to persistent diarrhea -Plan is for diagnostic flex sig with biopsies on 06/15/2019  A/p 1)Pan- Colitis--- patient previously received iv Vancomycin, Rocephin and Cefepime , also treated with p.o. Augmentin and p.o. Flagyl during this admission -Currently on oral vancomycin per GI started on 06/13/2018 --Continues to have numerous/frequent greenish mucousy stool---almost jelly appearing stools--- -initially C. difficile test and GI pathogen from 06/09/19 negative, repeat C. difficile Neg on 06/13/19 -CT abdomen and pelvis from 06/13/2019 reviewed with radiologist Dr. Thornton Papas, compared to CT from 06/04/2019 --patient now has moderate to severe colitis ranging from the proximal ascending colon, involving the transverse colon and extending into the descending, sigmoid and colon as well as the rectum (colonic wall thickening is a lot more pronounced) -Discussed with infectious disease specialist Dr. Drucilla Schmidt on 06/14/2019---okay to continue oral vanco ordered by GI service, infectious disease recommends colonoscopy/flex sig proceed with biopsy for diagnostic purposes -Diagnostic flex sig with biopsy planned for 06/15/2019 -Continue Anusol AC for hemorrhoids  -WBC is around 11  2)Severe--- persistent electrolyte abnormalities including- hypocalcemia/hypokalemia/hypomagnesemia and hypoalbuminemia- --due to frequent BMs/GI losses---replacing electrolytes iv and orally -Potassium is down to 2.6, replace IV and orally again --At  risk for dehydration and severe electrolyte abnormalities given frequent stools- requires IV dextrose fluids  3)Acute symptomatic blood loss anemia superimposed on chronic blood loss anemia ----admission hemoglobin was  6.5 , initially received 1 unit of blood on 06/06/2019, -  Hemoglobin up to 8.5  after another 1 unit of packed cells on 06/10/2019 --Stool occult blood is positive, no overt GI bleed at this time --- Diagnostic flexible sigmoidoscopy planned for 06/15/2019  4)DM2--last A1c  6.9 reflecting excellent DM control PTA -Metformin on hold Use Novolog/Humalog Sliding scale insulin with Accu-Cheks/Fingersticks as ordered   5)- Urinary retention-- -Unable to void after removal of Foley catheter --Bladder scan over 500 Foley reinserted on 06/09/2019 after failing voiding trial -Flomax as ordered   6)HTN--stable, continue Coreg at reduced dose of 12.5 mg twice daily, restart amlodipine at 5 mg daily  7)Alcohol Abuse with DTs--- DT  resolved,  --continue folic acid, thiamine/multivitamin  -Patient is coherent  Disposition/Need for in-Hospital Stay- patient unable to be discharged at this time due to --- frequent stools leading to severe electrolyte abnormalities and risk for dehydration requiring IV fluids and IV replacements of magnesium and potassium -Patient From: home  D/C Place: home  Barriers: Not Clinically Stable- --- persistent diarrhea --- high risk for dehydration and severe electrolyte abnormalities given frequency of stools--- potassium is down to 2.6  despite IV and oral replacement  Code Status : full  Family Communication:   -Discussed with son [name] on 06/13/19 at bedside  Consults  : GI and phone consult with ID physician Dr. Drucilla Schmidt - Procedure:- -Diagnostic flex sig with biopsy planned for 06/15/2019  DVT Prophylaxis  :   - SCDs   Lab Results  Component Value Date   PLT 282 06/14/2019   Inpatient Medications  Scheduled Meds:  amLODipine  5 mg Oral Daily   carvedilol  12.5 mg Oral BID   Chlorhexidine Gluconate Cloth  6 each Topical Daily   folic acid  1 mg Oral Daily   Gerhardt's butt cream   Topical BID   hydrocortisone   Rectal QID   multivitamin with minerals  1 tablet Oral Daily   pantoprazole  40 mg Oral Daily   potassium chloride  40 mEq Oral Q3H   tamsulosin  0.4 mg Oral BID   thiamine  100 mg Oral Daily   Or   thiamine  100 mg Intravenous Daily   vancomycin  125 mg Oral TID WC & HS   Continuous Infusions:   dextrose 5 % and 0.45 % NaCl with KCl 20 mEq/L 30 mL/hr at 06/11/19 1817   magnesium sulfate bolus IVPB     potassium chloride     PRN Meds:.traMADol  Anti-infectives (From admission, onward)   Start     Dose/Rate Route Frequency Ordered Stop   06/13/19 1515  vancomycin (VANCOCIN) 50 mg/mL oral solution 125 mg     125 mg Oral 3 times daily with meals & bedtime 06/13/19 1508     06/09/19 1800  metroNIDAZOLE (FLAGYL) tablet 500 mg     500 mg Oral 3 times daily 06/09/19 1745 06/12/19 2112   06/07/19 2130  rifaximin (XIFAXAN) tablet 550 mg  Status:  Discontinued     550 mg Oral 2 times daily 06/07/19 2119 06/13/19 1502   06/06/19 2200  amoxicillin-clavulanate (AUGMENTIN) 875-125 MG per tablet 1 tablet     1 tablet Oral Every 12 hours 06/06/19 1822 06/12/19 2112   06/05/19 0645  metroNIDAZOLE (FLAGYL) IVPB 500 mg     500 mg 100 mL/hr over 60 Minutes Intravenous  Once 06/05/19 0640 06/05/19 0954   06/05/19 0600  ceFEPIme (MAXIPIME) 2 g in sodium chloride 0.9 % 100 mL IVPB  Status:  Discontinued     2 g 200 mL/hr over  30 Minutes Intravenous Every 8 hours 06/05/19 0217 06/05/19 0357   06/05/19 0400  cefTRIAXone (ROCEPHIN) 1 g in sodium chloride 0.9 % 100 mL IVPB  Status:  Discontinued     1 g 200 mL/hr over 30 Minutes Intravenous Every 24 hours 06/05/19 0357 06/06/19 1822   06/04/19 2130  vancomycin (VANCOREADY) IVPB 2000 mg/400 mL     2,000 mg 200 mL/hr over 120 Minutes Intravenous  Once 06/04/19 2130 06/05/19 0216   06/04/19 2115  ceFEPIme (MAXIPIME) 2 g in sodium chloride 0.9 % 100 mL IVPB     2 g 200 mL/hr over 30 Minutes Intravenous  Once 06/04/19 2105 06/04/19 2226   06/04/19 2115  metroNIDAZOLE (FLAGYL) IVPB 500 mg     500 mg 100 mL/hr over 60 Minutes Intravenous  Once 06/04/19 2105 06/05/19 0028   06/04/19 2115  vancomycin (VANCOCIN) IVPB 1000 mg/200 mL premix  Status:  Discontinued     1,000 mg 200 mL/hr over 60 Minutes Intravenous  Once 06/04/19 2105 06/04/19 2130         Objective:   Vitals:   06/13/19 1506 06/13/19 2038 06/14/19 0544 06/14/19 1311  BP:  (!) 162/81 (!) 155/72 (!) 148/78  Pulse:  71 70 73  Resp:  20 18 20   Temp:  100.1 F (37.8 C) 100 F (37.8 C) 99.5 F (37.5 C)  TempSrc:  Oral Oral Oral  SpO2: 97% 100% 100% 100%  Weight:      Height:        Wt Readings from Last 3 Encounters:  06/08/19 98.7 kg  05/31/18 100 kg  05/17/18 93.5 kg    Intake/Output Summary (Last 24 hours) at 06/14/2019 1528 Last data filed at 06/14/2019 1318 Gross per 24 hour  Intake --  Output 1600 ml  Net -1600 ml   Physical Exam  Gen:-coherent, in no acute distress HEENT:- [place].AT, No sclera icterus Neck-Supple Neck,No JVD,.  Lungs-  CTAB , fair symmetrical air movement CV- S1, S2 normal, regular ,  Abd-  +ve B.Sounds, Abd Soft, No tenderness, no CVA area tenderness Extremity/Skin:- No  edema, pedal pulses present  Psych-alert and oriented mostly,  neuro-generalized weakness, no new focal deficits,  GU-Foley in situ   Data Review:   Micro Results Recent Results (from the past 240 hour(s))  Culture, blood (routine x 2)     Status: None   Collection Time: 06/04/19  8:05 PM   Specimen: Left Antecubital; Blood  Result Value Ref Range Status   Specimen Description LEFT ANTECUBITAL  Final   Special Requests   Final    BOTTLES DRAWN AEROBIC AND ANAEROBIC Blood Culture adequate volume   Culture   Final    NO GROWTH 5 DAYS Performed at [facility], [address], [place], [place] [address]    Report Status 06/09/2019 FINAL  Final  Urine culture     Status: None   Collection Time: 06/04/19  8:12 PM   Specimen: Urine, Catheterized  Result Value Ref Range Status   Specimen Description   Final    URINE, CATHETERIZED Performed at [facility], [address] [place], [place] [address]    Special Requests   Final    NONE Performed at [facility], [address] [place], [place] [address]    Culture   Final    NO GROWTH Performed at [facility], [place] [address], [place],  [address]    Report Status 06/07/2019 FINAL  Final  Culture, blood (routine  x 2)     Status: None   Collection Time: 06/04/19  8:49 PM   Specimen: Right Antecubital; Blood  Result Value Ref Range Status   Specimen Description RIGHT ANTECUBITAL  Final   Special Requests   Final    BOTTLES DRAWN AEROBIC AND ANAEROBIC Blood Culture adequate volume   Culture   Final    NO GROWTH 5 DAYS Performed at [facility], [address], [place], [place] [address]    Report Status 06/09/2019 FINAL  Final  MRSA PCR Screening     Status: None   Collection Time: 06/05/19  6:46 PM   Specimen: Nasopharyngeal  Result Value Ref Range Status   MRSA by PCR NEGATIVE NEGATIVE Final    Comment:        The GeneXpert MRSA Assay (FDA approved for NASAL specimens only), is one component of a comprehensive MRSA colonization surveillance program. It is not intended to diagnose MRSA infection nor to guide or monitor treatment for MRSA infections. Performed at [facility], [address], [place], [place] [address]   Respiratory Panel by RT PCR (Flu A&B, Covid) - Nasopharyngeal Swab     Status: None   Collection Time: 06/06/19  2:51 AM   Specimen: Nasopharyngeal Swab  Result Value Ref Range Status   SARS Coronavirus 2 by RT PCR NEGATIVE NEGATIVE Final    Comment: (NOTE) SARS-CoV-2 target nucleic acids are NOT DETECTED. The SARS-CoV-2 RNA is generally detectable in upper respiratoy specimens during the acute phase of infection. The lowest concentration of SARS-CoV-2 viral copies this assay can detect is 131 copies/mL. A negative result does not preclude SARS-Cov-2 infection and should not be used as the sole basis for treatment or other patient management decisions. A negative result may occur with  improper specimen collection/handling, submission of specimen other than nasopharyngeal swab, presence of viral mutation(s) within the areas targeted by this assay,  and inadequate number of viral copies (<131 copies/mL). A negative result must be combined with clinical observations, patient history, and epidemiological information. The expected result is Negative. Fact Sheet for Patients:  PinkCheek.be Fact Sheet for Healthcare Providers:  GravelBags.it This test is not yet ap proved or cleared by the [place] FDA and  has been authorized for detection and/or diagnosis of SARS-CoV-2 by FDA under an Emergency Use Authorization (EUA). This EUA will remain  in effect (meaning this test can be used) for the duration of the COVID-19 declaration under Section 564(b)(1) of the Act, 21 U.S.C. section 360bbb-3(b)(1), unless the authorization is terminated or revoked sooner.    Influenza A by PCR NEGATIVE NEGATIVE Final   Influenza B by PCR NEGATIVE NEGATIVE Final    Comment: (NOTE) The Xpert Xpress SARS-CoV-2/FLU/RSV assay is intended as an aid in  the diagnosis of influenza from Nasopharyngeal swab specimens and  should not be used as a sole basis for treatment. Nasal washings and  aspirates are unacceptable for Xpert Xpress SARS-CoV-2/FLU/RSV  testing. Fact Sheet for Patients: PinkCheek.be Fact Sheet for Healthcare Providers: GravelBags.it This test is not yet approved or cleared by the [place] FDA and  has been authorized for detection and/or diagnosis of SARS-CoV-2 by  FDA under an Emergency Use Authorization (EUA). This EUA will remain  in effect (meaning this test can be used) for the duration of the  Covid-19 declaration under Section 564(b)(1) of the Act, 21  U.S.C. section 360bbb-3(b)(1), unless the authorization is  terminated or revoked. Performed at [facility], [address], [place],  [place] [address]   Gastrointestinal Panel by PCR , Stool     Status: None   Collection Time: 06/09/19  2:12 PM   Specimen: Stool   Result Value Ref Range Status   Campylobacter species NOT DETECTED NOT DETECTED Final   Plesimonas shigelloides NOT DETECTED NOT DETECTED Final   Salmonella species NOT DETECTED NOT DETECTED Final   Yersinia enterocolitica NOT DETECTED NOT DETECTED Final   Vibrio species NOT DETECTED NOT DETECTED Final   Vibrio cholerae NOT DETECTED NOT DETECTED Final   Enteroaggregative E coli (EAEC) NOT DETECTED NOT DETECTED Final   Enteropathogenic E coli (EPEC) NOT DETECTED NOT DETECTED Final   Enterotoxigenic E coli (ETEC) NOT DETECTED NOT DETECTED Final   Shiga like toxin producing E coli (STEC) NOT DETECTED NOT DETECTED Final   Shigella/Enteroinvasive E coli (EIEC) NOT DETECTED NOT DETECTED Final   Cryptosporidium NOT DETECTED NOT DETECTED Final   Cyclospora cayetanensis NOT DETECTED NOT DETECTED Final   Entamoeba histolytica NOT DETECTED NOT DETECTED Final   Giardia lamblia NOT DETECTED NOT DETECTED Final   Adenovirus F40/41 NOT DETECTED NOT DETECTED Final   Astrovirus NOT DETECTED NOT DETECTED Final   Norovirus GI/GII NOT DETECTED NOT DETECTED Final   Rotavirus A NOT DETECTED NOT DETECTED Final   Sapovirus (I, II, IV, and V) NOT DETECTED NOT DETECTED Final    Comment: Performed at [facility], [address] [place], [place] [address]  C Difficile Quick Screen (NO PCR Reflex)     Status: None   Collection Time: 06/09/19  2:13 PM   Specimen: Stool  Result Value Ref Range Status   C Diff antigen NEGATIVE NEGATIVE Final   C Diff toxin NEGATIVE NEGATIVE Final   C Diff interpretation No C. difficile detected.  Final    Comment: Performed at [facility], [address], [place], [place] [address]  Gastrointestinal Panel by PCR , Stool     Status: None   Collection Time: 06/13/19  3:35 PM   Specimen: Stool  Result Value Ref Range Status   Campylobacter species NOT DETECTED NOT DETECTED Final   Plesimonas shigelloides NOT DETECTED NOT DETECTED Final   Salmonella species NOT DETECTED  NOT DETECTED Final   Yersinia enterocolitica NOT DETECTED NOT DETECTED Final   Vibrio species NOT DETECTED NOT DETECTED Final   Vibrio cholerae NOT DETECTED NOT DETECTED Final   Enteroaggregative E coli (EAEC) NOT DETECTED NOT DETECTED Final   Enteropathogenic E coli (EPEC) NOT DETECTED NOT DETECTED Final   Enterotoxigenic E coli (ETEC) NOT DETECTED NOT DETECTED Final   Shiga like toxin producing E coli (STEC) NOT DETECTED NOT DETECTED Final   Shigella/Enteroinvasive E coli (EIEC) NOT DETECTED NOT DETECTED Final   Cryptosporidium NOT DETECTED NOT DETECTED Final   Cyclospora cayetanensis NOT DETECTED NOT DETECTED Final   Entamoeba histolytica NOT DETECTED NOT DETECTED Final   Giardia lamblia NOT DETECTED NOT DETECTED Final   Adenovirus F40/41 NOT DETECTED NOT DETECTED Final   Astrovirus NOT DETECTED NOT DETECTED Final   Norovirus GI/GII NOT DETECTED NOT DETECTED Final   Rotavirus A NOT DETECTED NOT DETECTED Final   Sapovirus (I, II, IV, and V) NOT DETECTED NOT DETECTED Final    Comment: Performed at [facility], [address] [place],  [address]  C Difficile Quick Screen w PCR reflex     Status: None   Collection Time: 06/13/19  3:35 PM  Result Value Ref Range Status   C Diff antigen NEGATIVE NEGATIVE Final  C Diff toxin NEGATIVE NEGATIVE Final   C Diff interpretation No C. difficile detected.  Final    Comment: Performed at [facility], [address] [place], [place] [address]    Radiology Reports CT ABDOMEN PELVIS W CONTRAST  Result Date: 06/13/2019 CLINICAL DATA:  Diarrhea. EXAM: CT ABDOMEN AND PELVIS WITH CONTRAST TECHNIQUE: Multidetector CT imaging of the abdomen and pelvis was performed using the standard protocol following bolus administration of intravenous contrast. CONTRAST:  138mL OMNIPAQUE IOHEXOL 300 MG/ML  SOLN COMPARISON:  June 04, 2019. FINDINGS: Lower chest: Mild bilateral pleural effusions are noted with adjacent subsegmental atelectasis.  Hepatobiliary: Hepatic steatosis is noted. No gallstones are noted. No biliary dilatation is noted. Pancreas: Unremarkable. No pancreatic ductal dilatation or surrounding inflammatory changes. Spleen: Normal in size without focal abnormality. Adrenals/Urinary Tract: Adrenal glands appear normal. Right renal cysts are noted. No renal or ureteral calculi are noted. Minimal left hydroureteronephrosis is noted without obstructing calculus. Urinary bladder is decompressed secondary to Foley catheter. Stomach/Bowel: The stomach appears normal. The appendix is not visualized. Moderate to severe wall thickening is seen involving the transverse, descending and sigmoid colon and rectum concerning for inflammatory or infectious colitis. There is no evidence of bowel obstruction. Vascular/Lymphatic: No significant vascular findings are present. No enlarged abdominal or pelvic lymph nodes. Reproductive: Prostate is unremarkable. Other: No abdominal wall hernia or abnormality. No abdominopelvic ascites. Musculoskeletal: No acute or significant osseous findings. IMPRESSION: 1. Hepatic steatosis. 2. Mild bilateral pleural effusions are noted with adjacent subsegmental atelectasis. 3. Minimal left hydroureteronephrosis is noted without obstructing calculus. 4. Moderate to severe wall thickening is seen involving the transverse, descending and sigmoid colon and rectum concerning for inflammatory or infectious colitis. Electronically Signed   By: Marijo Conception M.D.   On: 06/13/2019 12:44   CT Abdomen Pelvis W Contrast  Result Date: 06/04/2019 CLINICAL DATA:  69 year old male with abdominal distension and diarrhea. Concern for acute diverticulitis. EXAM: CT ABDOMEN AND PELVIS WITH CONTRAST TECHNIQUE: Multidetector CT imaging of the abdomen and pelvis was performed using the standard protocol following bolus administration of intravenous contrast. CONTRAST:  133mL OMNIPAQUE IOHEXOL 300 MG/ML  SOLN COMPARISON:  None. FINDINGS:  Lower chest: The visualized lung bases are clear. No intra-abdominal free air or free fluid. Hepatobiliary: Severe fatty infiltration of the liver. No intrahepatic biliary ductal dilatation. The gallbladder is unremarkable. Pancreas: Atrophic pancreas. No Accu to inflammatory changes. No dilatation of the main pancreatic duct. Spleen: Normal in size without focal abnormality. Adrenals/Urinary Tract: The adrenal glands are unremarkable. There is mild bilateral hydronephrosis, right greater left. There is enhancement of the right renal urothelium concerning for pyelonephritis. Correlation with urinalysis recommended. There is slight delayed enhancement of the right renal parenchyma. Multiple right renal cysts measure up to 4 cm in the interpolar aspect of the right kidney. Additional subcentimeter right renal hypodense lesions are too small to characterize. The urinary bladder is distended. Stomach/Bowel: There is narrowing of a segment of the rectosigmoid, likely related to mass effect caused by distended urinary bladder. There is mild thickened appearance of the segment of colon with surrounding stranding. The perisigmoid stranding may be related to UTI but concerning for stercoral colitis. Clinical correlation is recommended. There is probable mild associate luminal narrowing of the sigmoid colon. No evidence of small-bowel obstruction. The appendix is not visualized with certainty. No inflammatory changes identified in the right lower quadrant. Vascular/Lymphatic: The abdominal aorta and IVC unremarkable. No portal venous gas. There is no adenopathy. Reproductive: The prostate  and seminal vesicles are grossly unremarkable. Partially visualized fluid within the left inguinal canal likely related to hydrocele. Other: Mild diffuse subcutaneous edema. Musculoskeletal: Degenerative changes of the spine. No acute osseous pathology. IMPRESSION: 1. Distended urinary bladder with mild bilateral hydronephrosis and findings  of right-sided pyelonephritis. Correlation with urinalysis recommended. 2. Thickened appearance of the rectosigmoid with surrounding stranding which may be related to UTI but concerning for stercoral colitis. Clinical correlation is recommended. 3. Severe fatty liver. Electronically Signed   By: Anner Crete M.D.   On: 06/04/2019 23:41   US Venous Img Lower Bilateral (DVT)  Result Date: 06/05/2019 CLINICAL DATA:  Bilateral lower extremity pain and edema for 1 month. EXAM: BILATERAL LOWER EXTREMITY VENOUS DOPPLER ULTRASOUND TECHNIQUE: Gray-scale sonography with compression, as well as color and duplex ultrasound, were performed to evaluate the deep venous system(s) from the level of the common femoral vein through the popliteal and proximal calf veins. COMPARISON:  None. FINDINGS: VENOUS Normal compressibility of the common femoral, superficial femoral, and popliteal veins, as well as the visualized calf veins. Visualized portions of profunda femoral vein and great saphenous vein unremarkable. No filling defects to suggest DVT on grayscale or color Doppler imaging. Doppler waveforms show normal direction of venous flow, normal respiratory phasicity and response to augmentation. OTHER None. Limitations: none IMPRESSION: No femoropopliteal DVT nor evidence of DVT within the visualized calf veins. If clinical symptoms are inconsistent or if there are persistent or worsening symptoms, further imaging (possibly involving the iliac veins) may be warranted. Electronically Signed   By: Abigail Miyamoto M.D.   On: 06/05/2019 11:44   DG Chest Port 1 View  Result Date: 06/04/2019 CLINICAL DATA:  Tachycardia, bilateral lower extremity edema EXAM: PORTABLE CHEST 1 VIEW COMPARISON:  05/13/2018 FINDINGS: The heart size and mediastinal contours are within normal limits. Both lungs are clear. The visualized skeletal structures are unremarkable. IMPRESSION: No active disease. Electronically Signed   By: Randa Ngo M.D.    On: 06/04/2019 20:32   DG Abd 2 Views  Result Date: 06/11/2019 CLINICAL DATA:  Abdominal pain. EXAM: ABDOMEN - 2 VIEW COMPARISON:  None. FINDINGS: No free air, portal venous gas, or pneumatosis. Numerous air-filled mildly prominent but nondilated loops of small bowel. No gas in the region of the rectum. No other acute abnormalities. IMPRESSION: 1. Numerous mildly prominent loops of small bowel without significant dilatation. Developing ileus favored. Very early small bowel obstruction not definitely excluded despite the lack of definitive dilatation as no definitive colonic gas is noted. Recommend clinical correlation and attention on follow-up. Electronically Signed   By: Dorise Bullion III M.D   On: 06/11/2019 13:29   ECHOCARDIOGRAM COMPLETE  Result Date: 06/05/2019    ECHOCARDIOGRAM REPORT   Patient Name:   ASTOR GENTLE Date of Exam: 06/05/2019 Medical Rec #:  [id number]     Height:       73.0 in Accession #:    [id number]    Weight:       205.0 lb Date of Birth:  1950/09/21    BSA:          2.174 m Patient Age:    22 years      BP:           152/86 mmHg Patient Gender: M             HR:           90 bpm. Exam Location:  [facility] Procedure: 2D Echo, Cardiac Doppler and Color  Doppler Indications:    Lower extremity edema [218969]                 Alcohol abuse [798921]  History:        Patient has no prior history of Echocardiogram examinations.                 Risk Factors:Hypertension, Diabetes, Current Smoker and                 Dyslipidemia.  Sonographer:    Alvino Chapel RCS Referring Phys: [place]  1. Left ventricular ejection fraction, by estimation, is 60 to 65%. The left ventricle has normal function. The left ventricle has no regional wall motion abnormalities. There is moderate left ventricular hypertrophy. Left ventricular diastolic parameters were normal.  2. Right ventricular systolic function is normal. The right ventricular size is normal.  3. Left atrial size was  mildly dilated.  4. The mitral valve is normal in structure. No evidence of mitral valve regurgitation.  5. The aortic valve is tricuspid. Aortic valve regurgitation is not visualized. No aortic stenosis is present. FINDINGS  Left Ventricle: Left ventricular ejection fraction, by estimation, is 60 to 65%. The left ventricle has normal function. The left ventricle has no regional wall motion abnormalities. The left ventricular internal cavity size was normal in size. There is  moderate left ventricular hypertrophy. Left ventricular diastolic parameters were normal. Right Ventricle: The right ventricular size is normal. Right vetricular wall thickness was not assessed. Right ventricular systolic function is normal. There is mildly elevated pulmonary artery systolic pressure. The tricuspid regurgitant velocity is 2.55 m/s, and with an assumed right atrial pressure of 8 mmHg, the estimated right ventricular systolic pressure is 19.4 mmHg. Left Atrium: Left atrial size was mildly dilated. Right Atrium: Right atrial size was normal in size. Pericardium: Trivial pericardial effusion is present. Presence of pericardial fat pad. Mitral Valve: The mitral valve is normal in structure. No evidence of mitral valve regurgitation. Tricuspid Valve: The tricuspid valve is normal in structure. Tricuspid valve regurgitation is trivial. Aortic Valve: The aortic valve is tricuspid. Aortic valve regurgitation is not visualized. No aortic stenosis is present. Pulmonic Valve: The pulmonic valve was not well visualized. Pulmonic valve regurgitation is not visualized. Aorta: The aortic root is normal in size and structure. IAS/Shunts: The interatrial septum was not well visualized.  LEFT VENTRICLE PLAX 2D LVIDd:         4.85 cm  Diastology LVIDs:         3.13 cm  LV e' lateral:   10.80 cm/s LV PW:         1.35 cm  LV E/e' lateral: 9.4 LV IVS:        1.41 cm  LV e' medial:    9.03 cm/s LVOT diam:     2.00 cm  LV E/e' medial:  11.2 LV SV:          76 LV SV Index:   35 LVOT Area:     3.14 cm  RIGHT VENTRICLE RV Mid diam:    3.04 cm RV S prime:     19.60 cm/s TAPSE (M-mode): 2.1 cm LEFT ATRIUM             Index LA diam:        4.10 cm 1.89 cm/m LA Vol (A2C):   95.3 ml 43.83 ml/m LA Vol (A4C):   66.2 ml 30.45 ml/m LA Biplane Vol: 80.8 ml 37.16 ml/m  AORTIC VALVE LVOT Vmax:   112.00 cm/s LVOT Vmean:  79.100 cm/s LVOT VTI:    0.241 m  AORTA Ao Root diam: 3.10 cm MITRAL VALVE                TRICUSPID VALVE MV Area (PHT): 3.07 cm     TR Peak grad:   26.0 mmHg MV Decel Time: 247 msec     TR Vmax:        255.00 cm/s MV E velocity: 101.00 cm/s MV A velocity: 95.10 cm/s   SHUNTS MV E/A ratio:  1.06         Systemic VTI:  0.24 m                             Systemic Diam: 2.00 cm Oswaldo Milian MD Electronically signed by Oswaldo Milian MD Signature Date/Time: 06/05/2019/10:46:22 PM    Final     CBC Recent Labs  Lab 06/09/19 0518 06/10/19 0606 06/11/19 0604 06/13/19 0518 06/14/19 0616  WBC 7.1 7.8 11.2* 10.9* 10.0  HGB 7.1* 6.9* 8.2* 8.2* 8.5*  HCT 22.1* 21.4* 25.4* 25.3* 25.7*  PLT 299 282 305 293 282  MCV 94.0 94.7 91.4 90.0 89.2  MCH 30.2 30.5 29.5 29.2 29.5  MCHC 32.1 32.2 32.3 32.4 33.1  RDW 17.9* 17.9* 17.6* 17.3* 17.2*    Chemistries  Recent Labs  Lab 06/08/19 0354 06/08/19 0354 06/09/19 0518 06/09/19 0518 06/10/19 0606 06/11/19 0604 06/12/19 0640 06/13/19 0518 06/14/19 0616  NA 145   < > 144   < > 142 142 143 142 140  K 2.7*   < > 2.6*   < > 2.5* 2.3* 2.3* 2.4* 2.6*  CL 118*   < > 118*   < > 119* 118* 119* 118* 118*  CO2 20*   < > 18*   < > 18* 17* 17* 17* 16*  GLUCOSE 115*   < > 104*   < > 111* 146* 96 96 104*  BUN 14   < > 12   < > 12 9 7* 6* 5*  CREATININE 0.96   < > 0.93   < > 1.05 1.04 1.04 0.98 0.88  CALCIUM 7.0*   < > 7.1*   < > 7.0* 7.3* 7.1* 7.2* 7.2*  MG 1.7   < > 1.8  --  1.6*  --  1.6* 1.9 1.7  AST 37  --  42*  --   --   --   --   --  26  ALT 23  --  24  --   --   --   --   --  15  ALKPHOS  90  --  92  --   --   --   --   --  97  BILITOT 0.5  --  0.5  --   --   --   --   --  0.7   < > = values in this interval not displayed.   ------------------------------------------------------------------------------------------------------------------ No results for input(s): CHOL, HDL, LDLCALC, TRIG, CHOLHDL, LDLDIRECT in the last 72 hours.  Lab Results  Component Value Date   HGBA1C 6.9 (H) 05/13/2018   ------------------------------------------------------------------------------------------------------------------ No results for input(s): TSH, T4TOTAL, T3FREE, THYROIDAB in the last 72 hours.  Invalid input(s): FREET3 ------------------------------------------------------------------------------------------------------------------ No results for input(s): VITAMINB12, FOLATE, FERRITIN, TIBC, IRON, RETICCTPCT in the last 72 hours.  Coagulation profile No results for input(s): INR, PROTIME in the  last 168 hours.  No results for input(s): DDIMER in the last 72 hours.  Cardiac Enzymes No results for input(s): CKMB, TROPONINI, MYOGLOBIN in the last 168 hours.  Invalid input(s): CK ------------------------------------------------------------------------------------------------------------------    Component Value Date/Time   BNP 238.0 (H) 06/05/2019 7564   Roxan Hockey M.D on 06/14/2019 at 3:28 PM  Go to [email or web address] - for contact info  [facility] - Office  [phone]

## 2019-06-14 NOTE — H&P (View-Only) (Signed)
Subjective: Reports no BMs over night and 1 BM this morning. Spoke with nursing staff wo report 5-6 stools yesterday, 2-3 over night, and one so far this morning. Stools are green an mucousy with a few pieces. No brbpr or melena.  Denies abdominal pain, BRBPR, melena, rectal pain, nausea, or vomiting.  Tolerating diet.  Reports having a good appetite.  Ate all of his breakfast. Reports older brother having trouble with "ulcers". When asking about UC or Crohn's disease, patient is not sure if brother has either of these diagnoses.   Objective: Vital signs in last 24 hours: Temp:  [98.8 F (37.1 C)-100.1 F (37.8 C)] 100 F (37.8 C) (04/27 0544) Pulse Rate:  [70-76] 70 (04/27 0544) Resp:  [18-20] 18 (04/27 0544) BP: (144-162)/(71-81) 155/72 (04/27 0544) SpO2:  [97 %-100 %] 100 % (04/27 0544) Last BM Date: (06/13/19) General:   Alert and oriented, pleasant Head:  Normocephalic and atraumatic. Eyes:  No icterus, sclera clear. Conjuctiva pink.  Abdomen:  Bowel sounds present. Abdomen is distended but soft and without tenderness to palpation, rebound or guarding. No masses appreciated  Extremities:  Without edema. Neurologic:  Alert and  oriented x4;  grossly normal neurologically. Skin:  Warm and dry, intact without significant lesions.  Psych: Normal mood and affect.  Intake/Output from previous day: 04/26 0701 - 04/27 0700 In: 480 [P.O.:480] Out: 1050 [Urine:1050] Intake/Output this shift: No intake/output data recorded.  Lab Results: Recent Labs    06/13/19 0518 06/14/19 0616  WBC 10.9* 10.0  HGB 8.2* 8.5*  HCT 25.3* 25.7*  PLT 293 282   BMET Recent Labs    06/12/19 0640 06/13/19 0518 06/14/19 0616  NA 143 142 140  K 2.3* 2.4* 2.6*  CL 119* 118* 118*  CO2 17* 17* 16*  GLUCOSE 96 96 104*  BUN 7* 6* 5*  CREATININE 1.04 0.98 0.88  CALCIUM 7.1* 7.2* 7.2*   LFT Recent Labs    06/14/19 0616  PROT 5.2*  ALBUMIN 2.0*  AST 26  ALT 15  ALKPHOS 97  BILITOT 0.7     Studies/Results: CT ABDOMEN PELVIS W CONTRAST  Result Date: 06/13/2019 CLINICAL DATA:  Diarrhea. EXAM: CT ABDOMEN AND PELVIS WITH CONTRAST TECHNIQUE: Multidetector CT imaging of the abdomen and pelvis was performed using the standard protocol following bolus administration of intravenous contrast. CONTRAST:  132mL OMNIPAQUE IOHEXOL 300 MG/ML  SOLN COMPARISON:  June 04, 2019. FINDINGS: Lower chest: Mild bilateral pleural effusions are noted with adjacent subsegmental atelectasis. Hepatobiliary: Hepatic steatosis is noted. No gallstones are noted. No biliary dilatation is noted. Pancreas: Unremarkable. No pancreatic ductal dilatation or surrounding inflammatory changes. Spleen: Normal in size without focal abnormality. Adrenals/Urinary Tract: Adrenal glands appear normal. Right renal cysts are noted. No renal or ureteral calculi are noted. Minimal left hydroureteronephrosis is noted without obstructing calculus. Urinary bladder is decompressed secondary to Foley catheter. Stomach/Bowel: The stomach appears normal. The appendix is not visualized. Moderate to severe wall thickening is seen involving the transverse, descending and sigmoid colon and rectum concerning for inflammatory or infectious colitis. There is no evidence of bowel obstruction. Vascular/Lymphatic: No significant vascular findings are present. No enlarged abdominal or pelvic lymph nodes. Reproductive: Prostate is unremarkable. Other: No abdominal wall hernia or abnormality. No abdominopelvic ascites. Musculoskeletal: No acute or significant osseous findings. IMPRESSION: 1. Hepatic steatosis. 2. Mild bilateral pleural effusions are noted with adjacent subsegmental atelectasis. 3. Minimal left hydroureteronephrosis is noted without obstructing calculus. 4. Moderate to severe wall thickening is seen  involving the transverse, descending and sigmoid colon and rectum concerning for inflammatory or infectious colitis. Electronically Signed   By:  Marijo Conception M.D.   On: 06/13/2019 12:44    Assessment: 69 year old male with history of alcohol abuse,  chronic anemia with blood transfusions on several prior occasions during hospitalization last year, admitted with suspected right-sided pyelonephritis and findings of possible stercoral colitis on CT abd/pelvis with contrast but more likely result of bladder distension with impingement on colon, persistent hypokalemia in setting of multiple watery stools.   Acute on chronic anemia: Hemoglobin down to 6.5 on 06/06/2019.  Heme positive but without overt GI bleeding.  Ferritin 379 (H), iron 17 (L), saturation 13% (L).  Received 2 units PRBCs during stay, with Hgb stable at 8.5, baseline for patient. EGD last year with erosive gastropathy, duodenal erosions, likely NSAID effect. Colonoscopy with inadequate prep April 2020 and large polyp piecemeal removed, overdue for early interval surveillance.   Diarrhea now with Pancolitis: Cdiff and GI pathogen panel negative on 4/22.  Multiple antibiotics this admission including 1 dose of cefepime and IV vancomycin, IV Rocephin, p.o. Augmentin and Flagyl with last antibiotics dosed on 06/12/2019. Was also on Linzess 145 mcg daily discontinued on 06/13/19.  Repeat CT A/P with contrast 06/13/2019 with progression of colitis from proximal ascending colon to sigmoid colon. Patient is without abdominal pain, N/V. Repeat C. difficile and GI pathogen panel ordered yesterday and patient was empirically started on oral vancomycin. C. difficile again negative. GI pathogen panel pending. Per nursing staff, seems stool frequency has improved slightly with one loose-watery mucous stool today (by 10:30 am) and 2-3 overnight. No brbpr or melena.   Etiology of diarrhea/pancolitis is not clear. Diarrhea likely influenced by antibiotics this admission and Linzess; however this doesn't explain pancolitis. Concern for infectious process that we have no been able to identify. Can't rule  out IBD. Dount ischemic colitis. Await GI pathogen panel. Will continue vancomycin for now and discuss further with Dr. Oneida Alar including the role of possible colonoscopy with biopsy for further evaluation.   Hypokalemia: severe and persistent in setting of diarrhea. Continue replacement per hospitalitis.   Plan: Continue Vancomycin for now.  Discuss further with Dr. Oneida Alar regarding continuing antibiotics and possible colonoscopy with biopsy for further evaluation of diarrhea.  Continue Anusol. Follow H/H Monitor for overt GI bleeding.  Continue PPI  Addendum: Spoke with nursing staff at 2:45pm. Patient has had 5 medium to large green, loose stools with mucous since 7AM. Although it seemed diarrhea frequency had improved this morning, at this time, there doesn't seem to be much improvement. Received call from Dr. Denton Brick who reported speaking with ID. No other stool tests to help identify etiology. ID stated it was fine to continue Vanc but this may delay diagnosis. May need endoscopic evaluation with biopsy. Will let Dr. Oneida Alar make the call on possible flex sig with biopsies.     LOS: 9 days    06/14/2019, 10:26 AM   Aliene Altes, PA-C [facility]

## 2019-06-14 NOTE — Progress Notes (Addendum)
Subjective: Reports no BMs over night and 1 BM this morning. Spoke with nursing staff wo report 5-6 stools yesterday, 2-3 over night, and one so far this morning. Stools are green an mucousy with a few pieces. No brbpr or melena.  Denies abdominal pain, BRBPR, melena, rectal pain, nausea, or vomiting.  Tolerating diet.  Reports having a good appetite.  Ate all of his breakfast. Reports older brother having trouble with "ulcers". When asking about UC or Crohn's disease, patient is not sure if brother has either of these diagnoses.   Objective: Vital signs in last 24 hours: Temp:  [98.8 F (37.1 C)-100.1 F (37.8 C)] 100 F (37.8 C) (04/27 0544) Pulse Rate:  [70-76] 70 (04/27 0544) Resp:  [18-20] 18 (04/27 0544) BP: (144-162)/(71-81) 155/72 (04/27 0544) SpO2:  [97 %-100 %] 100 % (04/27 0544) Last BM Date: (06/13/19) General:   Alert and oriented, pleasant Head:  Normocephalic and atraumatic. Eyes:  No icterus, sclera clear. Conjuctiva pink.  Abdomen:  Bowel sounds present. Abdomen is distended but soft and without tenderness to palpation, rebound or guarding. No masses appreciated  Extremities:  Without edema. Neurologic:  Alert and  oriented x4;  grossly normal neurologically. Skin:  Warm and dry, intact without significant lesions.  Psych: Normal mood and affect.  Intake/Output from previous day: 04/26 0701 - 04/27 0700 In: 480 [P.O.:480] Out: 1050 [Urine:1050] Intake/Output this shift: No intake/output data recorded.  Lab Results: Recent Labs    06/13/19 0518 06/14/19 0616  WBC 10.9* 10.0  HGB 8.2* 8.5*  HCT 25.3* 25.7*  PLT 293 282   BMET Recent Labs    06/12/19 0640 06/13/19 0518 06/14/19 0616  NA 143 142 140  K 2.3* 2.4* 2.6*  CL 119* 118* 118*  CO2 17* 17* 16*  GLUCOSE 96 96 104*  BUN 7* 6* 5*  CREATININE 1.04 0.98 0.88  CALCIUM 7.1* 7.2* 7.2*   LFT Recent Labs    06/14/19 0616  PROT 5.2*  ALBUMIN 2.0*  AST 26  ALT 15  ALKPHOS 97  BILITOT 0.7     Studies/Results: CT ABDOMEN PELVIS W CONTRAST  Result Date: 06/13/2019 CLINICAL DATA:  Diarrhea. EXAM: CT ABDOMEN AND PELVIS WITH CONTRAST TECHNIQUE: Multidetector CT imaging of the abdomen and pelvis was performed using the standard protocol following bolus administration of intravenous contrast. CONTRAST:  158mL OMNIPAQUE IOHEXOL 300 MG/ML  SOLN COMPARISON:  June 04, 2019. FINDINGS: Lower chest: Mild bilateral pleural effusions are noted with adjacent subsegmental atelectasis. Hepatobiliary: Hepatic steatosis is noted. No gallstones are noted. No biliary dilatation is noted. Pancreas: Unremarkable. No pancreatic ductal dilatation or surrounding inflammatory changes. Spleen: Normal in size without focal abnormality. Adrenals/Urinary Tract: Adrenal glands appear normal. Right renal cysts are noted. No renal or ureteral calculi are noted. Minimal left hydroureteronephrosis is noted without obstructing calculus. Urinary bladder is decompressed secondary to Foley catheter. Stomach/Bowel: The stomach appears normal. The appendix is not visualized. Moderate to severe wall thickening is seen involving the transverse, descending and sigmoid colon and rectum concerning for inflammatory or infectious colitis. There is no evidence of bowel obstruction. Vascular/Lymphatic: No significant vascular findings are present. No enlarged abdominal or pelvic lymph nodes. Reproductive: Prostate is unremarkable. Other: No abdominal wall hernia or abnormality. No abdominopelvic ascites. Musculoskeletal: No acute or significant osseous findings. IMPRESSION: 1. Hepatic steatosis. 2. Mild bilateral pleural effusions are noted with adjacent subsegmental atelectasis. 3. Minimal left hydroureteronephrosis is noted without obstructing calculus. 4. Moderate to severe wall thickening is seen  involving the transverse, descending and sigmoid colon and rectum concerning for inflammatory or infectious colitis. Electronically Signed   By:  Marijo Conception M.D.   On: 06/13/2019 12:44    Assessment: 69 year old male with history of alcohol abuse,  chronic anemia with blood transfusions on several prior occasions during hospitalization last year, admitted with suspected right-sided pyelonephritis and findings of possible stercoral colitis on CT abd/pelvis with contrast but more likely result of bladder distension with impingement on colon, persistent hypokalemia in setting of multiple watery stools.   Acute on chronic anemia: Hemoglobin down to 6.5 on 06/06/2019.  Heme positive but without overt GI bleeding.  Ferritin 379 (H), iron 17 (L), saturation 13% (L).  Received 2 units PRBCs during stay, with Hgb stable at 8.5, baseline for patient. EGD last year with erosive gastropathy, duodenal erosions, likely NSAID effect. Colonoscopy with inadequate prep April 2020 and large polyp piecemeal removed, overdue for early interval surveillance.   Diarrhea now with Pancolitis: Cdiff and GI pathogen panel negative on 4/22.  Multiple antibiotics this admission including 1 dose of cefepime and IV vancomycin, IV Rocephin, p.o. Augmentin and Flagyl with last antibiotics dosed on 06/12/2019. Was also on Linzess 145 mcg daily discontinued on 06/13/19.  Repeat CT A/P with contrast 06/13/2019 with progression of colitis from proximal ascending colon to sigmoid colon. Patient is without abdominal pain, N/V. Repeat C. difficile and GI pathogen panel ordered yesterday and patient was empirically started on oral vancomycin. C. difficile again negative. GI pathogen panel pending. Per nursing staff, seems stool frequency has improved slightly with one loose-watery mucous stool today (by 10:30 am) and 2-3 overnight. No brbpr or melena.   Etiology of diarrhea/pancolitis is not clear. Diarrhea likely influenced by antibiotics this admission and Linzess; however this doesn't explain pancolitis. Concern for infectious process that we have no been able to identify. Can't rule  out IBD. Dount ischemic colitis. Await GI pathogen panel. Will continue vancomycin for now and discuss further with Dr. Oneida Alar including the role of possible colonoscopy with biopsy for further evaluation.   Hypokalemia: severe and persistent in setting of diarrhea. Continue replacement per hospitalitis.   Plan: Continue Vancomycin for now.  Discuss further with Dr. Oneida Alar regarding continuing antibiotics and possible colonoscopy with biopsy for further evaluation of diarrhea.  Continue Anusol. Follow H/H Monitor for overt GI bleeding.  Continue PPI  Addendum: Spoke with nursing staff at 2:45pm. Patient has had 5 medium to large green, loose stools with mucous since 7AM. Although it seemed diarrhea frequency had improved this morning, at this time, there doesn't seem to be much improvement. Received call from Dr. Denton Brick who reported speaking with ID. No other stool tests to help identify etiology. ID stated it was fine to continue Vanc but this may delay diagnosis. May need endoscopic evaluation with biopsy. Will let Dr. Oneida Alar make the call on possible flex sig with biopsies.     LOS: 9 days    06/14/2019, 10:26 AM   Aliene Altes, PA-C [facility]

## 2019-06-15 ENCOUNTER — Encounter (HOSPITAL_COMMUNITY)
Admission: EM | Disposition: A | Discharge status: Home Health | Payer: Self-pay | Source: Home / Self Care | Attending: Family Medicine

## 2019-06-15 HISTORY — PX: BIOPSY: SHX5522

## 2019-06-15 HISTORY — PX: COLONOSCOPY: SHX5424

## 2019-06-15 LAB — CBC
HCT: 25.3 % — ABNORMAL LOW (ref 39.0–52.0)
Hemoglobin: 8.2 g/dL — ABNORMAL LOW (ref 13.0–17.0)
MCH: 29.4 pg (ref 26.0–34.0)
MCHC: 32.4 g/dL (ref 30.0–36.0)
MCV: 90.7 fL (ref 80.0–100.0)
Platelets: 308 10*3/uL (ref 150–400)
RBC: 2.79 MIL/uL — ABNORMAL LOW (ref 4.22–5.81)
RDW: 17 % — ABNORMAL HIGH (ref 11.5–15.5)
WBC: 9 10*3/uL (ref 4.0–10.5)
nRBC: 0 % (ref 0.0–0.2)

## 2019-06-15 LAB — COMPREHENSIVE METABOLIC PANEL
ALT: 14 U/L (ref 0–44)
AST: 26 U/L (ref 15–41)
Albumin: 2 g/dL — ABNORMAL LOW (ref 3.5–5.0)
Alkaline Phosphatase: 98 U/L (ref 38–126)
Anion gap: 3 — ABNORMAL LOW (ref 5–15)
BUN: 5 mg/dL — ABNORMAL LOW (ref 8–23)
CO2: 16 mmol/L — ABNORMAL LOW (ref 22–32)
Calcium: 7.3 mg/dL — ABNORMAL LOW (ref 8.9–10.3)
Chloride: 120 mmol/L — ABNORMAL HIGH (ref 98–111)
Creatinine, Ser: 0.86 mg/dL (ref 0.61–1.24)
GFR calc Af Amer: >60 mL/min (ref >60)
GFR calc non Af Amer: >60 mL/min (ref >60)
Glucose, Bld: 118 mg/dL — ABNORMAL HIGH (ref 70–99)
Potassium: 4.3 mmol/L (ref 3.5–5.1)
Sodium: 139 mmol/L (ref 135–145)
Total Bilirubin: 0.5 mg/dL (ref 0.3–1.2)
Total Protein: 5.4 g/dL — ABNORMAL LOW (ref 6.5–8.1)

## 2019-06-15 LAB — GLUCOSE, CAPILLARY
Glucose-Capillary: 102 mg/dL — ABNORMAL HIGH (ref 70–99)
Glucose-Capillary: 87 mg/dL (ref 70–99)
Glucose-Capillary: 102 mg/dL — ABNORMAL HIGH (ref 70–99)

## 2019-06-15 SURGERY — COLONOSCOPY
Anesthesia: Moderate Sedation

## 2019-06-15 MED ORDER — MEPERIDINE HCL 50 MG/ML IJ SOLN
INTRAMUSCULAR | Status: AC
  Filled 2019-06-15: qty 1

## 2019-06-15 MED ORDER — MIDAZOLAM HCL 5 MG/5ML IJ SOLN
INTRAMUSCULAR | Status: DC | PRN
  Administered 2019-06-15: 1 mg via INTRAVENOUS
  Administered 2019-06-15: 2 mg via INTRAVENOUS

## 2019-06-15 MED ORDER — ONDANSETRON HCL 4 MG/2ML IJ SOLN
INTRAMUSCULAR | Status: DC | PRN
  Administered 2019-06-15: 4 mg via INTRAVENOUS

## 2019-06-15 MED ORDER — MEPERIDINE HCL 100 MG/ML IJ SOLN
INTRAMUSCULAR | Status: DC | PRN
  Administered 2019-06-15: 10 mg via INTRAVENOUS
  Administered 2019-06-15: 25 mg via INTRAVENOUS

## 2019-06-15 MED ORDER — ONDANSETRON HCL 4 MG/2ML IJ SOLN
INTRAMUSCULAR | Status: AC
  Filled 2019-06-15: qty 2

## 2019-06-15 MED ORDER — STERILE WATER FOR IRRIGATION IR SOLN
Status: DC | PRN
  Administered 2019-06-15: 1.5 mL

## 2019-06-15 MED ORDER — SODIUM CHLORIDE 0.9 % IV SOLN
INTRAVENOUS | Status: DC
  Administered 2019-06-15: 1000 mL via INTRAVENOUS

## 2019-06-15 MED ORDER — MIDAZOLAM HCL 5 MG/5ML IJ SOLN
INTRAMUSCULAR | Status: AC
  Filled 2019-06-15: qty 10

## 2019-06-15 NOTE — Interval H&P Note (Signed)
History and Physical Interval Note:  06/15/2019 12:43 PM  Darren Howard  has presented today for surgery, with the diagnosis of pancolitis, persistent diarrhea of unknown etiology.  The various methods of treatment have been discussed with the patient and family. After consideration of risks, benefits and other options for treatment, the patient has consented to  Procedure(s): COLONOSCOPY (N/A) as a surgical intervention.  The patient's history has been reviewed, patient examined, no change in status, stable for surgery.  I have reviewed the patient's chart and labs.  Questions were answered to the patient's satisfaction.     [name]  No change.  Hemoglobin 8.2; potassium 4.3 sigmoidoscopy versus limited colonoscopy today per plan. The risks, benefits, limitations, alternatives and imponderables have been reviewed with the patient. Questions have been answered. All parties are agreeable.

## 2019-06-15 NOTE — Progress Notes (Signed)
PT Cancellation Note  Patient Details Name: Danon Lograsso MRN: [id number] DOB: Aug 15, 1950   Cancelled Treatment:    Reason Eval/Treat Not Completed: Medical issues which prohibited therapy.  Patient transferred to a higher level of care and will need new PT consult resume therapy when patient is medically stable.  Thank you.   7:55 AM, 06/15/19 Lonell Grandchild, MPT Physical Therapist with [facility] 336 [phone] office [phone] mobile phone

## 2019-06-15 NOTE — Progress Notes (Signed)
Pt arrived to room 312 via wheelchair from ICU. Ambulated 2 steps to bed with 2 assistants, legs weak and pt unsteady on his feet. Oriented to room and safety measures, states understanding. Call bell in reach.

## 2019-06-15 NOTE — Op Note (Addendum)
[facility] Patient Name: Darren Howard Procedure Date: 06/15/2019 12:22 PM MRN: [id number] Date of Birth: February 13, 1951 Attending MD: Norvel Richards , MD CSN: [id number] Age: 69 Admit Type: Inpatient Procedure:                Colonoscopy Indications:              Clinically significant diarrhea of unexplained                            origin; Providers:                Norvel Richards, MD, Otis Peak B. Gwenlyn Perking RN, RN,                            Raphael Gibney, Technician Referring MD:             [facility]ospit[facility] Medicines:                Midazolam 3 mg IV, Meperidine 35 mg IV, Ondansetron                            4 mg IV Complications:            No immediate complications. Estimated Blood Loss:     Estimated blood loss was minimal. Procedure:                Pre-Anesthesia Assessment:                           - Prior to the procedure, a History and Physical                            was performed, and patient medications and                            allergies were reviewed. The patient's tolerance of                            previous anesthesia was also reviewed. The risks                            and benefits of the procedure and the sedation                            options and risks were discussed with the patient.                            All questions were answered, and informed consent                            was obtained. Prior Anticoagulants: The patient has                            taken no previous anticoagulant or antiplatelet  agents. ASA Grade Assessment: III - A patient with                            severe systemic disease. After reviewing the risks                            and benefits, the patient was deemed in                            satisfactory condition to undergo the procedure.                           After obtaining informed consent, the colonoscope                            was passed under direct  vision. Throughout the                            procedure, the patient's blood pressure, pulse, and                            oxygen saturations were monitored continuously. The                            CF-HQ190L ([id number]) scope was introduced through                            the anus and advanced to the the cecum, identified                            by appendiceal orifice and ileocecal valve. The                            colonoscopy was performed without difficulty. The                            patient tolerated the procedure well. The quality                            of the bowel preparation was inadequate. Scope In: 12:54:34 PM Scope Out: 1:10:14 PM Scope Withdrawal Time: 0 hours 10 minutes 46 seconds  Total Procedure Duration: 0 hours 15 minutes 40 seconds  Findings:      The perianal and digital rectal examinations were normal. Prep was poor       and inadequate for polyp or small lesion detection. Scope advanced to       the cecum. The mucosa of the colon appeared diffusely edematous. There       were no erosions, ulcerations or pseudomembranes.      In particular, the sigmoid colon appeared to be thickened with a       perpendicularly "ringed" appearance to the mucosa. The mucosa was       particularly pale and appeared to have a cr?pe paper/cobblestoned       appearance. Multiple biopsies taken. Impression:               -  Preparation of the colon was inadequate. Abnormal                            colon diffusely, particularly in the sigmoid                            setting. Status post biopsy. Examination inadequate                            for polyp/focal lesion detection. I suppose a                            lymphocytic or eosinophilic colitis would remain in                            the differential. Idiopathic myointimal                            hyperplasia, a rare entity, would also be a                            possibility. Would be unusual for  traditional                            microscopic colitis to present in such a fashion.                            No typical findings of Crohn's disease or                            ulcerative colitis.                           Follow-up on pathology. Clear liquid diet.                           - Moderate Sedation:      Moderate (conscious) sedation was administered by the endoscopy nurse       and supervised by the endoscopist. The following parameters were       monitored: oxygen saturation, heart rate, blood pressure, respiratory       rate, EKG, adequacy of pulmonary ventilation, and response to care.       Total physician intraservice time was 22 minutes. Recommendation:            Procedure Code(s):        --- Professional ---                           [phone], Colonoscopy, flexible; diagnostic, including                            collection of specimen(s) by brushing or washing,                            when performed (separate procedure)  [id number], Moderate sedation services provided by the                            same physician or other qualified health care                            professional performing a gastrointestinal                            endoscopic service that sedation supports,                            requiring the presence of an independent trained                            observer to assist in the monitoring of the                            patient's level of consciousness and physiological                            status; initial 15 minutes of intra-service time;                            patient age 52 years or older (additional time may                            be reported with [phone], as appropriate) Diagnosis Code(s):        --- Professional ---                           R19.7, Diarrhea, unspecified CPT copyright 2019 [employer]. All rights reserved. The codes documented in this report are  preliminary and upon coder review may  be revised to meet current compliance requirements. Cristopher Estimable. Ronelle Michie, MD Norvel Richards, MD 06/15/2019 1:30:19 PM This report has been signed electronically. Number of Addenda: 0

## 2019-06-15 NOTE — Progress Notes (Signed)
Patient Demographics:    Darren Howard, is a 69 y.o. male, DOB - [birth year], XAJ:[id number]  Admit date - 06/04/2019   Admitting Physician Kathie Dike, MD  Outpatient Primary MD for the patient is [name], MD  LOS - 10  Chief Complaint  Patient presents with  . Leg Swelling       Subjective:    [name] patient reports feeling much better; no nausea, no vomiting, no abdominal pain and is afebrile.  Since last night no further episode of diarrhea.  He is in good spirit and denies chest pain or shortness of breath.  Oriented x3 and no hallucinations.    Assessment  & Plan :    Principal Problem:   Colitis, acute---??? infectious pancolitis Active Problems:   Hypertension   Normocytic anemia   DM2 (diabetes mellitus, type 2) (HCC)   Hypokalemia   Hypomagnesemia   Hypocalcemia   Alcohol abuse   Tobacco use   Pyelonephritis of right kidney   Pyelonephritis   Abnormal CT scan, colon   Diarrhea   Urinary retention  Brief Summary:- 69 y.o. male with medical history significant of alcohol abuse, chronic normocytic anemia, medication and treatment noncompliance, history of duodenal reduction, erosive gastropathy, GI bleed, glucose intolerance/type 2 diabetes, hyperlipidemia, hypertension admitted on 06/05/2019 with possible right-sided pyelonephritis, as well as stercoral colitis and found to have alcohol withdrawal symptoms -Now with frequent mucousy stools recurrent significant hypokalemia and hypomagnesemia -CT abdomen and pelvis from 06/13/2019 with pancolitis ----Patient is averaging over 10 bowel movements every 24 hours for the last several days ---Severe electrolyte abnormalities persist despite replacement due to persistent diarrhea -Plan is for diagnostic flex sig with biopsies later today 06/15/2019  A/p 1)Pan- Colitis--- patient previously received iv Vancomycin, Rocephin  and Cefepime , also treated with p.o. Augmentin and p.o. Flagyl during this admission -Currently on oral vancomycin per GI started on 06/13/2018 --Continues to have numerous/frequent greenish mucousy stool---almost jelly appearing stools--- -CT abdomen and pelvis from 06/13/2019 reviewed with radiologist Dr. Thornton Papas, compared to CT from 06/04/2019 --patient now has moderate to severe colitis ranging from the proximal ascending colon, involving the transverse colon and extending into the descending, sigmoid and colon as well as the rectum (colonic wall thickening is a lot more pronounced) -Discussed with infectious disease specialist Dr. [name] on 06/14/2019---okay to continue oral vanco ordered by GI service, infectious disease recommends colonoscopy/flex sig proceed with biopsy for diagnostic purposes.  Procedure planned for later today 4/281/2021 -Continue Anusol AC for hemorrhoids  -WBC is around 11; patient afebrile and so far C. difficile by PCR and GI pathogen negative.  2)Severe--- persistent electrolyte abnormalities including- hypocalcemia/hypokalemia/hypomagnesemia and hypoalbuminemia- --due to frequent BMs/GI losses and alcohol use ---replacing electrolytes iv and orally -Potassium is down to 2.6; after repletion has stabilized in the 4.2 range -Will continue monitoring on telemetry overnight; continue current maintenance supplementation. -Follow electrolytes trend.  3)Acute symptomatic blood loss anemia superimposed on chronic blood loss anemia ----admission hemoglobin was  6.5 , initially received 1 unit of blood on 06/06/2019, and another unit on 06/10/2019 -Hemoglobin up to 8.5 after transfusion. -No further signs of ongoing bleeding appreciated. -Continue to follow hemoglobin trend.  - Diagnostic flexible sigmoidoscopy planned for later today 06/15/2019  4)DM2--last A1c 6.9 reflecting excellent DM control PTA -Continue to hold oral hypoglycemic agents. -Continue sliding scale  insulin. -Follow CBGs and nodules management while inpatient.   5)- Urinary retention-- -Unable to void after removal of Foley catheter --Bladder scan over 500 Foley reinserted on 06/09/2019 after failing voiding trial -Continue Flomax as ordered -Reassess voiding trial prior to discharge; if he fails again we will leave the catheter in place and arrange outpatient neurology follow-up.  6)HTN--stable, and well-controlled currently -Continue current dose of carvedilol and daily amlodipine. -Plan is to resume heart healthy diet when properly able to tolerate by mouth.  7)Alcohol Abuse with DTs--- DT  resolved,  --continue folic acid, thiamine/multivitamin  -Patient is coherent and able to follow commands appropriately. -Cessation counseling has been provided.  Disposition/Need for in-Hospital Stay- patient unable to be discharged at this time due to --- frequent stools leading to severe electrolyte abnormalities and risk for dehydration requiring IV fluids and IV replacements of magnesium and potassium supplementation.  -Patient From: home and anticipate discharge home with [facility] services once medically stable.   D/C Place: home   Barriers: Not Clinically Stable- --- persistent diarrhea on stool 06/14/2019 --- high risk for dehydration and severe electrolyte abnormalities given frequency of stools--- after potassium repletion and constant maintenance supplementation through his veins potassium is stabilized in the 4.2 range this morning.  Following further recommendations by gastroenterologist and also infectious disease service plan is for sigmoidoscopy with biopsy later today.  Code Status : full  Family Communication:   -Discussed with son [name] on 06/13/19 at bedside  Consults  : GI and phone consult with ID physician Dr. Drucilla Schmidt - Procedure:- -Diagnostic flex sig with biopsy planned for 06/15/2019  DVT Prophylaxis  :   - SCDs   Lab Results  Component Value Date   PLT 308 06/15/2019    Inpatient Medications  Scheduled Meds: . amLODipine  5 mg Oral Daily  . carvedilol  12.5 mg Oral BID  . Chlorhexidine Gluconate Cloth  6 each Topical Daily  . folic acid  1 mg Oral Daily  . Gerhardt's butt cream   Topical BID  . hydrocortisone   Rectal QID  . meperidine      . midazolam      . multivitamin with minerals  1 tablet Oral Daily  . ondansetron      . pantoprazole  40 mg Oral Daily  . tamsulosin  0.4 mg Oral BID  . thiamine  100 mg Oral Daily   Or  . thiamine  100 mg Intravenous Daily  . vancomycin  125 mg Oral TID WC & HS   Continuous Infusions: . dextrose 5 % and 0.45 % NaCl with KCl 20 mEq/L 30 mL/hr at 06/15/19 1829   PRN Meds:.traMADol  Anti-infectives (From admission, onward)   Start     Dose/Rate Route Frequency Ordered Stop   06/13/19 1515  vancomycin (VANCOCIN) 50 mg/mL oral solution 125 mg     125 mg Oral 3 times daily with meals & bedtime 06/13/19 1508     06/09/19 1800  metroNIDAZOLE (FLAGYL) tablet 500 mg     500 mg Oral 3 times daily 06/09/19 1745 06/12/19 2112   06/07/19 2130  rifaximin (XIFAXAN) tablet 550 mg  Status:  Discontinued     550 mg Oral 2 times daily 06/07/19 2119 06/13/19 1502   06/06/19 2200  amoxicillin-clavulanate (AUGMENTIN) 875-125 MG per tablet 1 tablet     1 tablet Oral Every 12 hours 06/06/19 1822  06/12/19 2112   06/05/19 0645  metroNIDAZOLE (FLAGYL) IVPB 500 mg     500 mg 100 mL/hr over 60 Minutes Intravenous  Once 06/05/19 0640 06/05/19 0954   06/05/19 0600  ceFEPIme (MAXIPIME) 2 g in sodium chloride 0.9 % 100 mL IVPB  Status:  Discontinued     2 g 200 mL/hr over 30 Minutes Intravenous Every 8 hours 06/05/19 0217 06/05/19 0357   06/05/19 0400  cefTRIAXone (ROCEPHIN) 1 g in sodium chloride 0.9 % 100 mL IVPB  Status:  Discontinued     1 g 200 mL/hr over 30 Minutes Intravenous Every 24 hours 06/05/19 0357 06/06/19 1822   06/04/19 2130  vancomycin (VANCOREADY) IVPB 2000 mg/400 mL     2,000 mg 200 mL/hr over 120 Minutes  Intravenous  Once 06/04/19 2130 06/05/19 0216   06/04/19 2115  ceFEPIme (MAXIPIME) 2 g in sodium chloride 0.9 % 100 mL IVPB     2 g 200 mL/hr over 30 Minutes Intravenous  Once 06/04/19 2105 06/04/19 2226   06/04/19 2115  metroNIDAZOLE (FLAGYL) IVPB 500 mg     500 mg 100 mL/hr over 60 Minutes Intravenous  Once 06/04/19 2105 06/05/19 0028   06/04/19 2115  vancomycin (VANCOCIN) IVPB 1000 mg/200 mL premix  Status:  Discontinued     1,000 mg 200 mL/hr over 60 Minutes Intravenous  Once 06/04/19 2105 06/04/19 2130        Objective:   Vitals:   06/15/19 1305 06/15/19 1400 06/15/19 1958 06/15/19 2033  BP: 128/74 (!) 150/71  (!) 154/74  Pulse: 73 66  69  Resp: (!) 24 16  18   Temp:    98.9 F (37.2 C)  TempSrc:    Oral  SpO2: 100% 100% 99% 100%  Weight:      Height:        Wt Readings from Last 3 Encounters:  06/08/19 98.7 kg  05/31/18 100 kg  05/17/18 93.5 kg    Intake/Output Summary (Last 24 hours) at 06/15/2019 2142 Last data filed at 06/15/2019 1321 Gross per 24 hour  Intake 1613.99 ml  Output 1000 ml  Net 613.99 ml   Physical Exam General exam: Alert, awake, oriented x 3; reports no abdominal pain and currently no further episode of diarrhea (multiple episodes overnight).  Expressed no nausea or vomiting.  Patient is afebrile. Respiratory system: Clear to auscultation. Respiratory effort normal.  Good oxygen saturation on room air. Cardiovascular system:RRR. No murmurs, rubs, gallops.  No JVD. Gastrointestinal system: Abdomen is nondistended, soft and nontender. No organomegaly or masses felt. Normal bowel sounds heard. Central nervous system: Alert and oriented. No focal neurological deficits. Extremities: No cyanosis or clubbing; trace edema bilaterally appreciated. Skin: No rashes, lesions or ulcers Psychiatry: Judgement and insight appear normal. Mood & affect appropriate.     Data Review:   Micro Results Recent Results (from the past 240 hour(s))  Respiratory  Panel by RT PCR (Flu A&B, Covid) - Nasopharyngeal Swab     Status: None   Collection Time: 06/06/19  2:51 AM   Specimen: Nasopharyngeal Swab  Result Value Ref Range Status   SARS Coronavirus 2 by RT PCR NEGATIVE NEGATIVE Final    Comment: (NOTE) SARS-CoV-2 target nucleic acids are NOT DETECTED. The SARS-CoV-2 RNA is generally detectable in upper respiratoy specimens during the acute phase of infection. The lowest concentration of SARS-CoV-2 viral copies this assay can detect is 131 copies/mL. A negative result does not preclude SARS-Cov-2 infection and should not be used as the  sole basis for treatment or other patient management decisions. A negative result may occur with  improper specimen collection/handling, submission of specimen other than nasopharyngeal swab, presence of viral mutation(s) within the areas targeted by this assay, and inadequate number of viral copies (<131 copies/mL). A negative result must be combined with clinical observations, patient history, and epidemiological information. The expected result is Negative. Fact Sheet for Patients:  PinkCheek.be Fact Sheet for Healthcare Providers:  GravelBags.it This test is not yet ap proved or cleared by the [place] FDA and  has been authorized for detection and/or diagnosis of SARS-CoV-2 by FDA under an Emergency Use Authorization (EUA). This EUA will remain  in effect (meaning this test can be used) for the duration of the COVID-19 declaration under Section 564(b)(1) of the Act, 21 U.S.C. section 360bbb-3(b)(1), unless the authorization is terminated or revoked sooner.    Influenza A by PCR NEGATIVE NEGATIVE Final   Influenza B by PCR NEGATIVE NEGATIVE Final    Comment: (NOTE) The Xpert Xpress SARS-CoV-2/FLU/RSV assay is intended as an aid in  the diagnosis of influenza from Nasopharyngeal swab specimens and  should not be used as a sole basis for  treatment. Nasal washings and  aspirates are unacceptable for Xpert Xpress SARS-CoV-2/FLU/RSV  testing. Fact Sheet for Patients: PinkCheek.be Fact Sheet for Healthcare Providers: GravelBags.it This test is not yet approved or cleared by the [place] FDA and  has been authorized for detection and/or diagnosis of SARS-CoV-2 by  FDA under an Emergency Use Authorization (EUA). This EUA will remain  in effect (meaning this test can be used) for the duration of the  Covid-19 declaration under Section 564(b)(1) of the Act, 21  U.S.C. section 360bbb-3(b)(1), unless the authorization is  terminated or revoked. Performed at [facility], [address], [place], [place] [address]   Gastrointestinal Panel by PCR , Stool     Status: None   Collection Time: 06/09/19  2:12 PM   Specimen: Stool  Result Value Ref Range Status   Campylobacter species NOT DETECTED NOT DETECTED Final   Plesimonas shigelloides NOT DETECTED NOT DETECTED Final   Salmonella species NOT DETECTED NOT DETECTED Final   Yersinia enterocolitica NOT DETECTED NOT DETECTED Final   Vibrio species NOT DETECTED NOT DETECTED Final   Vibrio cholerae NOT DETECTED NOT DETECTED Final   Enteroaggregative E coli (EAEC) NOT DETECTED NOT DETECTED Final   Enteropathogenic E coli (EPEC) NOT DETECTED NOT DETECTED Final   Enterotoxigenic E coli (ETEC) NOT DETECTED NOT DETECTED Final   Shiga like toxin producing E coli (STEC) NOT DETECTED NOT DETECTED Final   Shigella/Enteroinvasive E coli (EIEC) NOT DETECTED NOT DETECTED Final   Cryptosporidium NOT DETECTED NOT DETECTED Final   Cyclospora cayetanensis NOT DETECTED NOT DETECTED Final   Entamoeba histolytica NOT DETECTED NOT DETECTED Final   Giardia lamblia NOT DETECTED NOT DETECTED Final   Adenovirus F40/41 NOT DETECTED NOT DETECTED Final   Astrovirus NOT DETECTED NOT DETECTED Final   Norovirus GI/GII NOT DETECTED NOT DETECTED Final     Rotavirus A NOT DETECTED NOT DETECTED Final   Sapovirus (I, II, IV, and V) NOT DETECTED NOT DETECTED Final    Comment: Performed at [facility], [address] [place], [place] [address]  C Difficile Quick Screen (NO PCR Reflex)     Status: None   Collection Time: 06/09/19  2:13 PM   Specimen: Stool  Result Value Ref Range Status   C Diff antigen NEGATIVE NEGATIVE Final  C Diff toxin NEGATIVE NEGATIVE Final   C Diff interpretation No C. difficile detected.  Final    Comment: Performed at [facility], [address], [place], [place] [address]  Gastrointestinal Panel by PCR , Stool     Status: None   Collection Time: 06/13/19  3:35 PM   Specimen: Stool  Result Value Ref Range Status   Campylobacter species NOT DETECTED NOT DETECTED Final   Plesimonas shigelloides NOT DETECTED NOT DETECTED Final   Salmonella species NOT DETECTED NOT DETECTED Final   Yersinia enterocolitica NOT DETECTED NOT DETECTED Final   Vibrio species NOT DETECTED NOT DETECTED Final   Vibrio cholerae NOT DETECTED NOT DETECTED Final   Enteroaggregative E coli (EAEC) NOT DETECTED NOT DETECTED Final   Enteropathogenic E coli (EPEC) NOT DETECTED NOT DETECTED Final   Enterotoxigenic E coli (ETEC) NOT DETECTED NOT DETECTED Final   Shiga like toxin producing E coli (STEC) NOT DETECTED NOT DETECTED Final   Shigella/Enteroinvasive E coli (EIEC) NOT DETECTED NOT DETECTED Final   Cryptosporidium NOT DETECTED NOT DETECTED Final   Cyclospora cayetanensis NOT DETECTED NOT DETECTED Final   Entamoeba histolytica NOT DETECTED NOT DETECTED Final   Giardia lamblia NOT DETECTED NOT DETECTED Final   Adenovirus F40/41 NOT DETECTED NOT DETECTED Final   Astrovirus NOT DETECTED NOT DETECTED Final   Norovirus GI/GII NOT DETECTED NOT DETECTED Final   Rotavirus A NOT DETECTED NOT DETECTED Final   Sapovirus (I, II, IV, and V) NOT DETECTED NOT DETECTED Final    Comment: Performed at [facility], [address]  [place], [place] [address]  C Difficile Quick Screen w PCR reflex     Status: None   Collection Time: 06/13/19  3:35 PM  Result Value Ref Range Status   C Diff antigen NEGATIVE NEGATIVE Final   C Diff toxin NEGATIVE NEGATIVE Final   C Diff interpretation No C. difficile detected.  Final    Comment: Performed at [facility], [address] [place], [place] [address]    Radiology Reports CT ABDOMEN PELVIS W CONTRAST  Result Date: 06/13/2019 CLINICAL DATA:  Diarrhea. EXAM: CT ABDOMEN AND PELVIS WITH CONTRAST TECHNIQUE: Multidetector CT imaging of the abdomen and pelvis was performed using the standard protocol following bolus administration of intravenous contrast. CONTRAST:  139mL OMNIPAQUE IOHEXOL 300 MG/ML  SOLN COMPARISON:  June 04, 2019. FINDINGS: Lower chest: Mild bilateral pleural effusions are noted with adjacent subsegmental atelectasis. Hepatobiliary: Hepatic steatosis is noted. No gallstones are noted. No biliary dilatation is noted. Pancreas: Unremarkable. No pancreatic ductal dilatation or surrounding inflammatory changes. Spleen: Normal in size without focal abnormality. Adrenals/Urinary Tract: Adrenal glands appear normal. Right renal cysts are noted. No renal or ureteral calculi are noted. Minimal left hydroureteronephrosis is noted without obstructing calculus. Urinary bladder is decompressed secondary to Foley catheter. Stomach/Bowel: The stomach appears normal. The appendix is not visualized. Moderate to severe wall thickening is seen involving the transverse, descending and sigmoid colon and rectum concerning for inflammatory or infectious colitis. There is no evidence of bowel obstruction. Vascular/Lymphatic: No significant vascular findings are present. No enlarged abdominal or pelvic lymph nodes. Reproductive: Prostate is unremarkable. Other: No abdominal wall hernia or abnormality. No abdominopelvic ascites. Musculoskeletal: No acute or significant osseous findings. IMPRESSION: 1.  Hepatic steatosis. 2. Mild bilateral pleural effusions are noted with adjacent subsegmental atelectasis. 3. Minimal left hydroureteronephrosis is noted without obstructing calculus. 4. Moderate to severe wall thickening is seen involving the transverse, descending and sigmoid colon and rectum concerning  for inflammatory or infectious colitis. Electronically Signed   By: Marijo Conception M.D.   On: 06/13/2019 12:44   CT Abdomen Pelvis W Contrast  Result Date: 06/04/2019 CLINICAL DATA:  69 year old male with abdominal distension and diarrhea. Concern for acute diverticulitis. EXAM: CT ABDOMEN AND PELVIS WITH CONTRAST TECHNIQUE: Multidetector CT imaging of the abdomen and pelvis was performed using the standard protocol following bolus administration of intravenous contrast. CONTRAST:  119mL OMNIPAQUE IOHEXOL 300 MG/ML  SOLN COMPARISON:  None. FINDINGS: Lower chest: The visualized lung bases are clear. No intra-abdominal free air or free fluid. Hepatobiliary: Severe fatty infiltration of the liver. No intrahepatic biliary ductal dilatation. The gallbladder is unremarkable. Pancreas: Atrophic pancreas. No Accu to inflammatory changes. No dilatation of the main pancreatic duct. Spleen: Normal in size without focal abnormality. Adrenals/Urinary Tract: The adrenal glands are unremarkable. There is mild bilateral hydronephrosis, right greater left. There is enhancement of the right renal urothelium concerning for pyelonephritis. Correlation with urinalysis recommended. There is slight delayed enhancement of the right renal parenchyma. Multiple right renal cysts measure up to 4 cm in the interpolar aspect of the right kidney. Additional subcentimeter right renal hypodense lesions are too small to characterize. The urinary bladder is distended. Stomach/Bowel: There is narrowing of a segment of the rectosigmoid, likely related to mass effect caused by distended urinary bladder. There is mild thickened appearance of the  segment of colon with surrounding stranding. The perisigmoid stranding may be related to UTI but concerning for stercoral colitis. Clinical correlation is recommended. There is probable mild associate luminal narrowing of the sigmoid colon. No evidence of small-bowel obstruction. The appendix is not visualized with certainty. No inflammatory changes identified in the right lower quadrant. Vascular/Lymphatic: The abdominal aorta and IVC unremarkable. No portal venous gas. There is no adenopathy. Reproductive: The prostate and seminal vesicles are grossly unremarkable. Partially visualized fluid within the left inguinal canal likely related to hydrocele. Other: Mild diffuse subcutaneous edema. Musculoskeletal: Degenerative changes of the spine. No acute osseous pathology. IMPRESSION: 1. Distended urinary bladder with mild bilateral hydronephrosis and findings of right-sided pyelonephritis. Correlation with urinalysis recommended. 2. Thickened appearance of the rectosigmoid with surrounding stranding which may be related to UTI but concerning for stercoral colitis. Clinical correlation is recommended. 3. Severe fatty liver. Electronically Signed   By: Anner Crete M.D.   On: 06/04/2019 23:41   US Venous Img Lower Bilateral (DVT)  Result Date: 06/05/2019 CLINICAL DATA:  Bilateral lower extremity pain and edema for 1 month. EXAM: BILATERAL LOWER EXTREMITY VENOUS DOPPLER ULTRASOUND TECHNIQUE: Gray-scale sonography with compression, as well as color and duplex ultrasound, were performed to evaluate the deep venous system(s) from the level of the common femoral vein through the popliteal and proximal calf veins. COMPARISON:  None. FINDINGS: VENOUS Normal compressibility of the common femoral, superficial femoral, and popliteal veins, as well as the visualized calf veins. Visualized portions of profunda femoral vein and great saphenous vein unremarkable. No filling defects to suggest DVT on grayscale or color Doppler  imaging. Doppler waveforms show normal direction of venous flow, normal respiratory phasicity and response to augmentation. OTHER None. Limitations: none IMPRESSION: No femoropopliteal DVT nor evidence of DVT within the visualized calf veins. If clinical symptoms are inconsistent or if there are persistent or worsening symptoms, further imaging (possibly involving the iliac veins) may be warranted. Electronically Signed   By: Abigail Miyamoto M.D.   On: 06/05/2019 11:44   DG Chest Port 1 View  Result Date: 06/04/2019 CLINICAL  DATA:  Tachycardia, bilateral lower extremity edema EXAM: PORTABLE CHEST 1 VIEW COMPARISON:  05/13/2018 FINDINGS: The heart size and mediastinal contours are within normal limits. Both lungs are clear. The visualized skeletal structures are unremarkable. IMPRESSION: No active disease. Electronically Signed   By: Randa Ngo M.D.   On: 06/04/2019 20:32   DG Abd 2 Views  Result Date: 06/11/2019 CLINICAL DATA:  Abdominal pain. EXAM: ABDOMEN - 2 VIEW COMPARISON:  None. FINDINGS: No free air, portal venous gas, or pneumatosis. Numerous air-filled mildly prominent but nondilated loops of small bowel. No gas in the region of the rectum. No other acute abnormalities. IMPRESSION: 1. Numerous mildly prominent loops of small bowel without significant dilatation. Developing ileus favored. Very early small bowel obstruction not definitely excluded despite the lack of definitive dilatation as no definitive colonic gas is noted. Recommend clinical correlation and attention on follow-up. Electronically Signed   By: Dorise Bullion III M.D   On: 06/11/2019 13:29   ECHOCARDIOGRAM COMPLETE  Result Date: 06/05/2019    ECHOCARDIOGRAM REPORT   Patient Name:   Darren Howard Date of Exam: 06/05/2019 Medical Rec #:  [id number]     Height:       73.0 in Accession #:    [id number]    Weight:       205.0 lb Date of Birth:  10-Apr-1950    BSA:          2.174 m Patient Age:    16 years      BP:           152/86  mmHg Patient Gender: M             HR:           90 bpm. Exam Location:  [facility] Procedure: 2D Echo, Cardiac Doppler and Color Doppler Indications:    Lower extremity edema [218969]                 Alcohol abuse [[id number]]  History:        Patient has no prior history of Echocardiogram examinations.                 Risk Factors:Hypertension, Diabetes, Current Smoker and                 Dyslipidemia.  Sonographer:    Alvino Chapel RCS Referring Phys: [place]  1. Left ventricular ejection fraction, by estimation, is 60 to 65%. The left ventricle has normal function. The left ventricle has no regional wall motion abnormalities. There is moderate left ventricular hypertrophy. Left ventricular diastolic parameters were normal.  2. Right ventricular systolic function is normal. The right ventricular size is normal.  3. Left atrial size was mildly dilated.  4. The mitral valve is normal in structure. No evidence of mitral valve regurgitation.  5. The aortic valve is tricuspid. Aortic valve regurgitation is not visualized. No aortic stenosis is present. FINDINGS  Left Ventricle: Left ventricular ejection fraction, by estimation, is 60 to 65%. The left ventricle has normal function. The left ventricle has no regional wall motion abnormalities. The left ventricular internal cavity size was normal in size. There is  moderate left ventricular hypertrophy. Left ventricular diastolic parameters were normal. Right Ventricle: The right ventricular size is normal. Right vetricular wall thickness was not assessed. Right ventricular systolic function is normal. There is mildly elevated pulmonary artery systolic pressure. The tricuspid regurgitant velocity is 2.55 m/s, and with an assumed right atrial pressure  of 8 mmHg, the estimated right ventricular systolic pressure is 73.5 mmHg. Left Atrium: Left atrial size was mildly dilated. Right Atrium: Right atrial size was normal in size. Pericardium: Trivial  pericardial effusion is present. Presence of pericardial fat pad. Mitral Valve: The mitral valve is normal in structure. No evidence of mitral valve regurgitation. Tricuspid Valve: The tricuspid valve is normal in structure. Tricuspid valve regurgitation is trivial. Aortic Valve: The aortic valve is tricuspid. Aortic valve regurgitation is not visualized. No aortic stenosis is present. Pulmonic Valve: The pulmonic valve was not well visualized. Pulmonic valve regurgitation is not visualized. Aorta: The aortic root is normal in size and structure. IAS/Shunts: The interatrial septum was not well visualized.  LEFT VENTRICLE PLAX 2D LVIDd:         4.85 cm  Diastology LVIDs:         3.13 cm  LV e' lateral:   10.80 cm/s LV PW:         1.35 cm  LV E/e' lateral: 9.4 LV IVS:        1.41 cm  LV e' medial:    9.03 cm/s LVOT diam:     2.00 cm  LV E/e' medial:  11.2 LV SV:         76 LV SV Index:   35 LVOT Area:     3.14 cm  RIGHT VENTRICLE RV Mid diam:    3.04 cm RV S prime:     19.60 cm/s TAPSE (M-mode): 2.1 cm LEFT ATRIUM             Index LA diam:        4.10 cm 1.89 cm/m LA Vol (A2C):   95.3 ml 43.83 ml/m LA Vol (A4C):   66.2 ml 30.45 ml/m LA Biplane Vol: 80.8 ml 37.16 ml/m  AORTIC VALVE LVOT Vmax:   112.00 cm/s LVOT Vmean:  79.100 cm/s LVOT VTI:    0.241 m  AORTA Ao Root diam: 3.10 cm MITRAL VALVE                TRICUSPID VALVE MV Area (PHT): 3.07 cm     TR Peak grad:   26.0 mmHg MV Decel Time: 247 msec     TR Vmax:        255.00 cm/s MV E velocity: 101.00 cm/s MV A velocity: 95.10 cm/s   SHUNTS MV E/A ratio:  1.06         Systemic VTI:  0.24 m                             Systemic Diam: 2.00 cm Oswaldo Milian MD Electronically signed by Oswaldo Milian MD Signature Date/Time: 06/05/2019/10:46:22 PM    Final     CBC Recent Labs  Lab 06/10/19 3299 06/11/19 0604 06/13/19 0518 06/14/19 0616 06/15/19 0424  WBC 7.8 11.2* 10.9* 10.0 9.0  HGB 6.9* 8.2* 8.2* 8.5* 8.2*  HCT 21.4* 25.4* 25.3* 25.7* 25.3*    PLT 282 305 293 282 308  MCV 94.7 91.4 90.0 89.2 90.7  MCH 30.5 29.5 29.2 29.5 29.4  MCHC 32.2 32.3 32.4 33.1 32.4  RDW 17.9* 17.6* 17.3* 17.2* 17.0*    Chemistries  Recent Labs  Lab 06/09/19 0518 06/09/19 0518 06/10/19 0606 06/10/19 0606 06/11/19 0604 06/12/19 0640 06/13/19 0518 06/14/19 0616 06/15/19 0424  NA 144   < > 142   < > 142 143 142 140 139  K 2.6*   < >  2.5*   < > 2.3* 2.3* 2.4* 2.6* 4.3  CL 118*   < > 119*   < > 118* 119* 118* 118* 120*  CO2 18*   < > 18*   < > 17* 17* 17* 16* 16*  GLUCOSE 104*   < > 111*   < > 146* 96 96 104* 118*  BUN 12   < > 12   < > 9 7* 6* 5* 5*  CREATININE 0.93   < > 1.05   < > 1.04 1.04 0.98 0.88 0.86  CALCIUM 7.1*   < > 7.0*   < > 7.3* 7.1* 7.2* 7.2* 7.3*  MG 1.8  --  1.6*  --   --  1.6* 1.9 1.7  --   AST 42*  --   --   --   --   --   --  26 26  ALT 24  --   --   --   --   --   --  15 14  ALKPHOS 92  --   --   --   --   --   --  97 98  BILITOT 0.5  --   --   --   --   --   --  0.7 0.5   < > = values in this interval not displayed.   ------------------------------------------------------------------------------------------------------------------ No results for input(s): CHOL, HDL, LDLCALC, TRIG, CHOLHDL, LDLDIRECT in the last 72 hours.  Lab Results  Component Value Date   HGBA1C 6.9 (H) 05/13/2018   ------------------------------------------------------------------------------------------------------------------ No results for input(s): TSH, T4TOTAL, T3FREE, THYROIDAB in the last 72 hours.  Invalid input(s): FREET3 ------------------------------------------------------------------------------------------------------------------ No results for input(s): VITAMINB12, FOLATE, FERRITIN, TIBC, IRON, RETICCTPCT in the last 72 hours.  Coagulation profile No results for input(s): INR, PROTIME in the last 168 hours.  No results for input(s): DDIMER in the last 72 hours.  Cardiac Enzymes No results for input(s): CKMB, TROPONINI,  MYOGLOBIN in the last 168 hours.  Invalid input(s): CK ------------------------------------------------------------------------------------------------------------------    Component Value Date/Time   BNP 238.0 (H) 06/05/2019 0421   Barton Dubois M.D on 06/15/2019 at 9:42 AM  Go to [email or web address] - for contact info  [facility] - Office  [phone]

## 2019-06-16 LAB — GLUCOSE, CAPILLARY
Glucose-Capillary: 107 mg/dL — ABNORMAL HIGH (ref 70–99)
Glucose-Capillary: 77 mg/dL (ref 70–99)
Glucose-Capillary: 86 mg/dL (ref 70–99)
Glucose-Capillary: 90 mg/dL (ref 70–99)
Glucose-Capillary: 93 mg/dL (ref 70–99)

## 2019-06-16 MED ORDER — SACCHAROMYCES BOULARDII 250 MG PO CAPS
250.0000 mg | ORAL_CAPSULE | Freq: Two times a day (BID) | ORAL | Status: DC
  Administered 2019-06-16 – 2019-06-20 (×8): 250 mg via ORAL
  Filled 2019-06-16 (×8): qty 1

## 2019-06-16 NOTE — Progress Notes (Signed)
Foley catheter removed per order. 200 cc of clear, yellow urine emptied from drainage bag. Patient educated and instructed to urinate as soon as possible. Patient tolerated well.

## 2019-06-16 NOTE — Progress Notes (Signed)
Subjective:  Patient denies abdominal pain. Feels hungry.   Objective: Vital signs in last 24 hours: Temp:  [98.3 F (36.8 C)-99.1 F (37.3 C)] 98.3 F (36.8 C) (04/29 0533) Pulse Rate:  [63-73] 71 (04/29 0533) Resp:  [14-24] 15 (04/29 0533) BP: (123-158)/(69-98) 140/93 (04/29 0533) SpO2:  [99 %-100 %] 100 % (04/29 0533) Last BM Date: 06/15/19 General:   Alert,  Well-developed, well-nourished, pleasant and cooperative in NAD Head:  Normocephalic and atraumatic. Eyes:  Sclera clear, no icterus.  Abdomen:  Soft, nontender and nondistended. Normal bowel sounds, without guarding, and without rebound.   Extremities:  Without clubbing, deformity or edema. Neurologic:  Alert and  oriented x4;  grossly normal neurologically. Skin:  Intact without significant lesions or rashes. Psych:  Alert and cooperative. Normal mood and affect.  Intake/Output from previous day: 04/28 0701 - 04/29 0700 In: 557 [I.V.:557] Out: 1700 [Urine:1700] Intake/Output this shift: No intake/output data recorded.  Lab Results: CBC Recent Labs    06/14/19 0616 06/15/19 0424  WBC 10.0 9.0  HGB 8.5* 8.2*  HCT 25.7* 25.3*  MCV 89.2 90.7  PLT 282 308   BMET Recent Labs    06/14/19 0616 06/15/19 0424  NA 140 139  K 2.6* 4.3  CL 118* 120*  CO2 16* 16*  GLUCOSE 104* 118*  BUN 5* 5*  CREATININE 0.88 0.86  CALCIUM 7.2* 7.3*   LFTs Recent Labs    06/14/19 0616 06/15/19 0424  BILITOT 0.7 0.5  ALKPHOS 97 98  AST 26 26  ALT 15 14  PROT 5.2* 5.4*  ALBUMIN 2.0* 2.0*   No results for input(s): LIPASE in the last 72 hours. PT/INR No results for input(s): LABPROT, INR in the last 72 hours.    Imaging Studies: CT ABDOMEN PELVIS W CONTRAST  Result Date: 06/13/2019 CLINICAL DATA:  Diarrhea. EXAM: CT ABDOMEN AND PELVIS WITH CONTRAST TECHNIQUE: Multidetector CT imaging of the abdomen and pelvis was performed using the standard protocol following bolus administration of intravenous contrast. CONTRAST:   159mL OMNIPAQUE IOHEXOL 300 MG/ML  SOLN COMPARISON:  June 04, 2019. FINDINGS: Lower chest: Mild bilateral pleural effusions are noted with adjacent subsegmental atelectasis. Hepatobiliary: Hepatic steatosis is noted. No gallstones are noted. No biliary dilatation is noted. Pancreas: Unremarkable. No pancreatic ductal dilatation or surrounding inflammatory changes. Spleen: Normal in size without focal abnormality. Adrenals/Urinary Tract: Adrenal glands appear normal. Right renal cysts are noted. No renal or ureteral calculi are noted. Minimal left hydroureteronephrosis is noted without obstructing calculus. Urinary bladder is decompressed secondary to Foley catheter. Stomach/Bowel: The stomach appears normal. The appendix is not visualized. Moderate to severe wall thickening is seen involving the transverse, descending and sigmoid colon and rectum concerning for inflammatory or infectious colitis. There is no evidence of bowel obstruction. Vascular/Lymphatic: No significant vascular findings are present. No enlarged abdominal or pelvic lymph nodes. Reproductive: Prostate is unremarkable. Other: No abdominal wall hernia or abnormality. No abdominopelvic ascites. Musculoskeletal: No acute or significant osseous findings. IMPRESSION: 1. Hepatic steatosis. 2. Mild bilateral pleural effusions are noted with adjacent subsegmental atelectasis. 3. Minimal left hydroureteronephrosis is noted without obstructing calculus. 4. Moderate to severe wall thickening is seen involving the transverse, descending and sigmoid colon and rectum concerning for inflammatory or infectious colitis. Electronically Signed   By: Marijo Conception M.D.   On: 06/13/2019 12:44   CT Abdomen Pelvis W Contrast  Result Date: 06/04/2019 CLINICAL DATA:  69 year old male with abdominal distension and diarrhea. Concern for acute diverticulitis. EXAM: CT  ABDOMEN AND PELVIS WITH CONTRAST TECHNIQUE: Multidetector CT imaging of the abdomen and pelvis was  performed using the standard protocol following bolus administration of intravenous contrast. CONTRAST:  158mL OMNIPAQUE IOHEXOL 300 MG/ML  SOLN COMPARISON:  None. FINDINGS: Lower chest: The visualized lung bases are clear. No intra-abdominal free air or free fluid. Hepatobiliary: Severe fatty infiltration of the liver. No intrahepatic biliary ductal dilatation. The gallbladder is unremarkable. Pancreas: Atrophic pancreas. No Accu to inflammatory changes. No dilatation of the main pancreatic duct. Spleen: Normal in size without focal abnormality. Adrenals/Urinary Tract: The adrenal glands are unremarkable. There is mild bilateral hydronephrosis, right greater left. There is enhancement of the right renal urothelium concerning for pyelonephritis. Correlation with urinalysis recommended. There is slight delayed enhancement of the right renal parenchyma. Multiple right renal cysts measure up to 4 cm in the interpolar aspect of the right kidney. Additional subcentimeter right renal hypodense lesions are too small to characterize. The urinary bladder is distended. Stomach/Bowel: There is narrowing of a segment of the rectosigmoid, likely related to mass effect caused by distended urinary bladder. There is mild thickened appearance of the segment of colon with surrounding stranding. The perisigmoid stranding may be related to UTI but concerning for stercoral colitis. Clinical correlation is recommended. There is probable mild associate luminal narrowing of the sigmoid colon. No evidence of small-bowel obstruction. The appendix is not visualized with certainty. No inflammatory changes identified in the right lower quadrant. Vascular/Lymphatic: The abdominal aorta and IVC unremarkable. No portal venous gas. There is no adenopathy. Reproductive: The prostate and seminal vesicles are grossly unremarkable. Partially visualized fluid within the left inguinal canal likely related to hydrocele. Other: Mild diffuse subcutaneous  edema. Musculoskeletal: Degenerative changes of the spine. No acute osseous pathology. IMPRESSION: 1. Distended urinary bladder with mild bilateral hydronephrosis and findings of right-sided pyelonephritis. Correlation with urinalysis recommended. 2. Thickened appearance of the rectosigmoid with surrounding stranding which may be related to UTI but concerning for stercoral colitis. Clinical correlation is recommended. 3. Severe fatty liver. Electronically Signed   By: Anner Crete M.D.   On: 06/04/2019 23:41   US Venous Img Lower Bilateral (DVT)  Result Date: 06/05/2019 CLINICAL DATA:  Bilateral lower extremity pain and edema for 1 month. EXAM: BILATERAL LOWER EXTREMITY VENOUS DOPPLER ULTRASOUND TECHNIQUE: Gray-scale sonography with compression, as well as color and duplex ultrasound, were performed to evaluate the deep venous system(s) from the level of the common femoral vein through the popliteal and proximal calf veins. COMPARISON:  None. FINDINGS: VENOUS Normal compressibility of the common femoral, superficial femoral, and popliteal veins, as well as the visualized calf veins. Visualized portions of profunda femoral vein and great saphenous vein unremarkable. No filling defects to suggest DVT on grayscale or color Doppler imaging. Doppler waveforms show normal direction of venous flow, normal respiratory phasicity and response to augmentation. OTHER None. Limitations: none IMPRESSION: No femoropopliteal DVT nor evidence of DVT within the visualized calf veins. If clinical symptoms are inconsistent or if there are persistent or worsening symptoms, further imaging (possibly involving the iliac veins) may be warranted. Electronically Signed   By: Abigail Miyamoto M.D.   On: 06/05/2019 11:44   DG Chest Port 1 View  Result Date: 06/04/2019 CLINICAL DATA:  Tachycardia, bilateral lower extremity edema EXAM: PORTABLE CHEST 1 VIEW COMPARISON:  05/13/2018 FINDINGS: The heart size and mediastinal contours are  within normal limits. Both lungs are clear. The visualized skeletal structures are unremarkable. IMPRESSION: No active disease. Electronically Signed   By: Legrand Como  Owens Shark M.D.   On: 06/04/2019 20:32   DG Abd 2 Views  Result Date: 06/11/2019 CLINICAL DATA:  Abdominal pain. EXAM: ABDOMEN - 2 VIEW COMPARISON:  None. FINDINGS: No free air, portal venous gas, or pneumatosis. Numerous air-filled mildly prominent but nondilated loops of small bowel. No gas in the region of the rectum. No other acute abnormalities. IMPRESSION: 1. Numerous mildly prominent loops of small bowel without significant dilatation. Developing ileus favored. Very early small bowel obstruction not definitely excluded despite the lack of definitive dilatation as no definitive colonic gas is noted. Recommend clinical correlation and attention on follow-up. Electronically Signed   By: Dorise Bullion III M.D   On: 06/11/2019 13:29   ECHOCARDIOGRAM COMPLETE  Result Date: 06/05/2019    ECHOCARDIOGRAM REPORT   Patient Name:   BRYLEE MCGREAL Date of Exam: 06/05/2019 Medical Rec #:  [id number]     Height:       73.0 in Accession #:    [id number]    Weight:       205.0 lb Date of Birth:  Jul 05, 1950    BSA:          2.174 m Patient Age:    69 years      BP:           152/86 mmHg Patient Gender: M             HR:           90 bpm. Exam Location:  [facility] Procedure: 2D Echo, Cardiac Doppler and Color Doppler Indications:    Lower extremity edema [218969]                 Alcohol abuse [354656]  History:        Patient has no prior history of Echocardiogram examinations.                 Risk Factors:Hypertension, Diabetes, Current Smoker and                 Dyslipidemia.  Sonographer:    Alvino Chapel RCS Referring Phys: [place]  1. Left ventricular ejection fraction, by estimation, is 60 to 65%. The left ventricle has normal function. The left ventricle has no regional wall motion abnormalities. There is moderate left ventricular  hypertrophy. Left ventricular diastolic parameters were normal.  2. Right ventricular systolic function is normal. The right ventricular size is normal.  3. Left atrial size was mildly dilated.  4. The mitral valve is normal in structure. No evidence of mitral valve regurgitation.  5. The aortic valve is tricuspid. Aortic valve regurgitation is not visualized. No aortic stenosis is present. FINDINGS  Left Ventricle: Left ventricular ejection fraction, by estimation, is 60 to 65%. The left ventricle has normal function. The left ventricle has no regional wall motion abnormalities. The left ventricular internal cavity size was normal in size. There is  moderate left ventricular hypertrophy. Left ventricular diastolic parameters were normal. Right Ventricle: The right ventricular size is normal. Right vetricular wall thickness was not assessed. Right ventricular systolic function is normal. There is mildly elevated pulmonary artery systolic pressure. The tricuspid regurgitant velocity is 2.55 m/s, and with an assumed right atrial pressure of 8 mmHg, the estimated right ventricular systolic pressure is 81.2 mmHg. Left Atrium: Left atrial size was mildly dilated. Right Atrium: Right atrial size was normal in size. Pericardium: Trivial pericardial effusion is present. Presence of pericardial fat pad. Mitral Valve: The mitral valve is normal  in structure. No evidence of mitral valve regurgitation. Tricuspid Valve: The tricuspid valve is normal in structure. Tricuspid valve regurgitation is trivial. Aortic Valve: The aortic valve is tricuspid. Aortic valve regurgitation is not visualized. No aortic stenosis is present. Pulmonic Valve: The pulmonic valve was not well visualized. Pulmonic valve regurgitation is not visualized. Aorta: The aortic root is normal in size and structure. IAS/Shunts: The interatrial septum was not well visualized.  LEFT VENTRICLE PLAX 2D LVIDd:         4.85 cm  Diastology LVIDs:         3.13 cm  LV  e' lateral:   10.80 cm/s LV PW:         1.35 cm  LV E/e' lateral: 9.4 LV IVS:        1.41 cm  LV e' medial:    9.03 cm/s LVOT diam:     2.00 cm  LV E/e' medial:  11.2 LV SV:         76 LV SV Index:   35 LVOT Area:     3.14 cm  RIGHT VENTRICLE RV Mid diam:    3.04 cm RV S prime:     19.60 cm/s TAPSE (M-mode): 2.1 cm LEFT ATRIUM             Index LA diam:        4.10 cm 1.89 cm/m LA Vol (A2C):   95.3 ml 43.83 ml/m LA Vol (A4C):   66.2 ml 30.45 ml/m LA Biplane Vol: 80.8 ml 37.16 ml/m  AORTIC VALVE LVOT Vmax:   112.00 cm/s LVOT Vmean:  79.100 cm/s LVOT VTI:    0.241 m  AORTA Ao Root diam: 3.10 cm MITRAL VALVE                TRICUSPID VALVE MV Area (PHT): 3.07 cm     TR Peak grad:   26.0 mmHg MV Decel Time: 247 msec     TR Vmax:        255.00 cm/s MV E velocity: 101.00 cm/s MV A velocity: 95.10 cm/s   SHUNTS MV E/A ratio:  1.06         Systemic VTI:  0.24 m                             Systemic Diam: 2.00 cm Oswaldo Milian MD Electronically signed by Oswaldo Milian MD Signature Date/Time: 06/05/2019/10:46:22 PM    Final   [2 weeks]   Assessment: 69 year old male with history of alcohol abuse, chronic anemia with blood transfusions on several prior occasions during hospitalization last year, admitted with suspected right-sided pyelonephritis and findings of possible stercoral colitis on CT abdomen/pelvis with contrast but more likely result of bladder distention with impingement on the colon, persistent hypokalemia in the setting of multiple watery stools.  Acute on chronic anemia: Heme positive but no overt GI bleeding.  Has received 2 units of packed red blood cells this admission with lowest hemoglobin of 6.5 prior to transfusion.  H&H has been stable for several days.  EGD last year with erosive gastropathy, duodenal erosions, likely NSAID effect.  Colonoscopy with inadequate prep April 2020 and large polyp removed piecemeal, overdue for early interval surveillance.  Diarrhea with pancolitis  on repeat imaging: C. difficile and GI pathogen panel neg April 22.  Multiple antibiotics this admission including cefepime, IV vancomycin, Rocephin, Augmentin, Flagyl.  He was also on Linzess 145 mcg daily which was  discontinued June 13, 2019.  Repeat CT abdomen pelvis with contrast on April 26 with progression of colitis from proximal ascending colon to sigmoid colon.  Patient denies abdominal pain, vomiting.  Patient empirically started on oral vancomycin.  Repeat C. difficile negative, GI pathogen panel both negative.  Patient completed colonoscopy yesterday (prep was poor and inadequate for polyp or small lesion detection).  Mucosa of the colon appeared diffusely edematous but no erosions, ulcerations, pseudomembranes.  Sigmoid colon appeared to be thickened with a perpendicularly ringed appearance to the mucosa.  Mucosa was pale and appeared to have crpe paper/cobblestoned appearance.  Multiple biopsies taken.  Endoscopically did not look like inflammatory bowel disease.  Plan: 1. Follow-up colon Biopsies. 2. Continue oral vancomycin for now.  3. I have tried to call nursing staff to confirm stool output last 24 hours but unable to get in contact. Patient reports stools somewhat lessened. Three [place] 6-7 reported in the last 24 hours.  4. Will advance to soft diet.   Laureen Ochs. Bernarda Caffey [facility] [phone] 4/29/202110:31 AM     LOS: 11 days

## 2019-06-16 NOTE — Plan of Care (Signed)

## 2019-06-16 NOTE — Progress Notes (Signed)
Patient Demographics:    Darren Howard, is a 69 y.o. male, DOB - 27-May-1950, VHQ:[id number]  Admit date - 06/04/2019   Admitting Physician No admitting provider for patient encounter.  Outpatient Primary MD for the patient is [name], MD  LOS - 11  Chief Complaint  Patient presents with  . Leg Swelling       Subjective:    Darren Howard patient reports feeling much better; no nausea, no vomiting, no abdominal pain and is afebrile.  Since last night no further episode of diarrhea.  He is in good spirit and denies chest pain or shortness of breath.  Oriented x3 and no hallucinations.    Assessment  & Plan :    Principal Problem:   Colitis, acute---??? infectious pancolitis Active Problems:   Hypertension   Normocytic anemia   DM2 (diabetes mellitus, type 2) (HCC)   Hypokalemia   Hypomagnesemia   Hypocalcemia   Alcohol abuse   Tobacco use   Pyelonephritis of right kidney   Pyelonephritis   Abnormal CT scan, colon   Diarrhea   Urinary retention  Brief Summary:- 69 y.o. male with medical history significant of alcohol abuse, chronic normocytic anemia, medication and treatment noncompliance, history of duodenal reduction, erosive gastropathy, GI bleed, glucose intolerance/type 2 diabetes, hyperlipidemia, hypertension admitted on 06/05/2019 with possible right-sided pyelonephritis, as well as stercoral colitis and found to have alcohol withdrawal symptoms  A/p 1)Pan- Colitis--- patient previously received iv Vancomycin, Rocephin and Cefepime , also treated with p.o. Augmentin and p.o. Flagyl during this admission -Currently on oral vancomycin per GI started on 06/13/2018 --Continues to have numerous/frequent greenish mucousy stool---almost jelly appearing stools--- -CT abdomen and pelvis from 06/13/2019 reviewed with radiologist Dr. Thornton Papas, compared to CT from 06/04/2019 --patient now has  moderate to severe colitis ranging from the proximal ascending colon, involving the transverse colon and extending into the descending, sigmoid and colon as well as the rectum (colonic wall thickening is a lot more pronounced) -Discussed with infectious disease specialist Dr. [name] on 06/14/2019---okay to continue oral vanco ordered by GI service, infectious disease recommended colonoscopy/flex sig procedure with biopsy for diagnostic purposes.   -s/p flex on 06/15/19; biopsy results pending -continue oral vanc for now -Continue Anusol AC for hemorrhoids  -WBC is around 9; patient afebrile and so far C. difficile by PCR and GI pathogen negative.  2)Severe--- persistent electrolyte abnormalities including- hypocalcemia/hypokalemia/hypomagnesemia and hypoalbuminemia- --due to frequent BMs/GI losses and alcohol use ---replacing electrolytes iv and orally -Potassium was down to 2.6; after repletion has stabilized in the 4.0 range -Will advance diet and stop supplementation. Follow electrolytes trend in am and start maintenance if needed -will d/c telemetry  3)Acute symptomatic blood loss anemia superimposed on chronic blood loss anemia ----admission hemoglobin was  6.5 , initially received 1 unit of blood on 06/06/2019, and another unit on 06/10/2019 -Hemoglobin up to 8.5 after transfusion. Currently 8.2 -No further signs of ongoing bleeding appreciated. -Continue to follow hemoglobin trend.  - Diagnostic flexible sigmoidoscopy done on 4/28; no active bleeding appreciated.  4)DM2--last A1c 6.9 reflecting excellent DM control PTA -Continue to hold oral hypoglycemic agents. -Continue sliding scale insulin. -Follow CBGs and nodules management while inpatient.   5)- Urinary retention-- -Unable to void after  removal of Foley catheter --Bladder scan over 500 Foley reinserted on 06/09/2019 after failing voiding trial -Continue Flomax as ordered -Reassess voiding trial prior to discharge; if he fails  again we will leave the catheter in place and arrange outpatient neurology follow-up.  6)HTN--stable, and well-controlled currently -Continue current dose of carvedilol and daily amlodipine. -Plan is to resume heart healthy diet when properly able to tolerate by mouth.  7)Alcohol Abuse with DTs--- DT  resolved,  --continue folic acid, thiamine/multivitamin  -Patient is coherent and able to follow commands appropriately. -Cessation counseling has been provided.  Disposition:  -Patient From: home and anticipate discharge home with [facility] services once medically stable.   D/C Place: home   Barriers: Not Clinically Stable- -improvement in her diarrhea reported.  No nausea vomiting.  Denies abdominal pain.  Diet will be further advance to assess tolerance.  Patient started on Florastor and continue oral vancomycin currently.  Will discontinue IV fluids and repeat basic metabolic panel in the morning.  Continue to follow electrolytes and GI service recommendations.  If able to tolerate diet and no further ongoing GI losses anticipate discharge home on 06/17/2019.  Code Status : full  Family Communication:   -Discussed with son [name] on 06/13/19 at bedside  Consults  : GI and phone consult with ID physician Dr. Drucilla Schmidt - Procedure:- -Diagnostic flex sig with biopsy planned for 06/15/2019  DVT Prophylaxis  :   - SCDs   Lab Results  Component Value Date   PLT 308 06/15/2019   Inpatient Medications  Scheduled Meds: . amLODipine  5 mg Oral Daily  . carvedilol  12.5 mg Oral BID  . Chlorhexidine Gluconate Cloth  6 each Topical Daily  . folic acid  1 mg Oral Daily  . Gerhardt's butt cream   Topical BID  . hydrocortisone   Rectal QID  . multivitamin with minerals  1 tablet Oral Daily  . pantoprazole  40 mg Oral Daily  . saccharomyces boulardii  250 mg Oral BID  . tamsulosin  0.4 mg Oral BID  . thiamine  100 mg Oral Daily   Or  . thiamine  100 mg Intravenous Daily  . vancomycin  125 mg  Oral TID WC & HS   Continuous Infusions:  PRN Meds:.traMADol  Anti-infectives (From admission, onward)   Start     Dose/Rate Route Frequency Ordered Stop   06/13/19 1515  vancomycin (VANCOCIN) 50 mg/mL oral solution 125 mg     125 mg Oral 3 times daily with meals & bedtime 06/13/19 1508     06/09/19 1800  metroNIDAZOLE (FLAGYL) tablet 500 mg     500 mg Oral 3 times daily 06/09/19 1745 06/12/19 2112   06/07/19 2130  rifaximin (XIFAXAN) tablet 550 mg  Status:  Discontinued     550 mg Oral 2 times daily 06/07/19 2119 06/13/19 1502   06/06/19 2200  amoxicillin-clavulanate (AUGMENTIN) 875-125 MG per tablet 1 tablet     1 tablet Oral Every 12 hours 06/06/19 1822 06/12/19 2112   06/05/19 0645  metroNIDAZOLE (FLAGYL) IVPB 500 mg     500 mg 100 mL/hr over 60 Minutes Intravenous  Once 06/05/19 0640 06/05/19 0954   06/05/19 0600  ceFEPIme (MAXIPIME) 2 g in sodium chloride 0.9 % 100 mL IVPB  Status:  Discontinued     2 g 200 mL/hr over 30 Minutes Intravenous Every 8 hours 06/05/19 0217 06/05/19 0357   06/05/19 0400  cefTRIAXone (ROCEPHIN) 1 g in sodium chloride 0.9 % 100 mL  IVPB  Status:  Discontinued     1 g 200 mL/hr over 30 Minutes Intravenous Every 24 hours 06/05/19 0357 06/06/19 1822   06/04/19 2130  vancomycin (VANCOREADY) IVPB 2000 mg/400 mL     2,000 mg 200 mL/hr over 120 Minutes Intravenous  Once 06/04/19 2130 06/05/19 0216   06/04/19 2115  ceFEPIme (MAXIPIME) 2 g in sodium chloride 0.9 % 100 mL IVPB     2 g 200 mL/hr over 30 Minutes Intravenous  Once 06/04/19 2105 06/04/19 2226   06/04/19 2115  metroNIDAZOLE (FLAGYL) IVPB 500 mg     500 mg 100 mL/hr over 60 Minutes Intravenous  Once 06/04/19 2105 06/05/19 0028   06/04/19 2115  vancomycin (VANCOCIN) IVPB 1000 mg/200 mL premix  Status:  Discontinued     1,000 mg 200 mL/hr over 60 Minutes Intravenous  Once 06/04/19 2105 06/04/19 2130        Objective:   Vitals:   06/15/19 2033 06/16/19 0533 06/16/19 1421 06/16/19 1500  BP: (!)  154/74 (!) 140/93  (!) 142/88  Pulse: 69 71  65  Resp: 18 15  19   Temp: 98.9 F (37.2 C) 98.3 F (36.8 C)  98.5 F (36.9 C)  TempSrc: Oral   Oral  SpO2: 100% 100% 98% 100%  Weight:      Height:        Wt Readings from Last 3 Encounters:  06/08/19 98.7 kg  05/31/18 100 kg  05/17/18 93.5 kg    Intake/Output Summary (Last 24 hours) at 06/16/2019 1740 Last data filed at 06/16/2019 1248 Gross per 24 hour  Intake 480 ml  Output 1400 ml  Net -920 ml   Physical Exam General exam: Alert, awake, oriented x 3; no nausea, no vomiting, no abdominal pain.  Patient reports small loose BMs overnight (x3).  No blood appreciated. Respiratory system: Clear to auscultation. Respiratory effort normal. Cardiovascular system:RRR. No murmurs, rubs, gallops. Gastrointestinal system: Abdomen is nondistended, soft and nontender. No organomegaly or masses felt. Normal bowel sounds heard. Central nervous system: Alert and oriented. No focal neurological deficits. Extremities: No C/C/E, +pedal pulses Skin: No rashes, lesions or ulcers Psychiatry: Judgement and insight appear normal. Mood & affect appropriate.       Data Review:   Micro Results Recent Results (from the past 240 hour(s))  Gastrointestinal Panel by PCR , Stool     Status: None   Collection Time: 06/09/19  2:12 PM   Specimen: Stool  Result Value Ref Range Status   Campylobacter species NOT DETECTED NOT DETECTED Final   Plesimonas shigelloides NOT DETECTED NOT DETECTED Final   Salmonella species NOT DETECTED NOT DETECTED Final   Yersinia enterocolitica NOT DETECTED NOT DETECTED Final   Vibrio species NOT DETECTED NOT DETECTED Final   Vibrio cholerae NOT DETECTED NOT DETECTED Final   Enteroaggregative E coli (EAEC) NOT DETECTED NOT DETECTED Final   Enteropathogenic E coli (EPEC) NOT DETECTED NOT DETECTED Final   Enterotoxigenic E coli (ETEC) NOT DETECTED NOT DETECTED Final   Shiga like toxin producing E coli (STEC) NOT DETECTED NOT  DETECTED Final   Shigella/Enteroinvasive E coli (EIEC) NOT DETECTED NOT DETECTED Final   Cryptosporidium NOT DETECTED NOT DETECTED Final   Cyclospora cayetanensis NOT DETECTED NOT DETECTED Final   Entamoeba histolytica NOT DETECTED NOT DETECTED Final   Giardia lamblia NOT DETECTED NOT DETECTED Final   Adenovirus F40/41 NOT DETECTED NOT DETECTED Final   Astrovirus NOT DETECTED NOT DETECTED Final   Norovirus GI/GII NOT DETECTED NOT  DETECTED Final   Rotavirus A NOT DETECTED NOT DETECTED Final   Sapovirus (I, II, IV, and V) NOT DETECTED NOT DETECTED Final    Comment: Performed at [facility], [place], [place] [address]  C Difficile Quick Screen (NO PCR Reflex)     Status: None   Collection Time: 06/09/19  2:13 PM   Specimen: Stool  Result Value Ref Range Status   C Diff antigen NEGATIVE NEGATIVE Final   C Diff toxin NEGATIVE NEGATIVE Final   C Diff interpretation No C. difficile detected.  Final    Comment: Performed at [facility], [address], [place], [place] [address]  Gastrointestinal Panel by PCR , Stool     Status: None   Collection Time: 06/13/19  3:35 PM   Specimen: Stool  Result Value Ref Range Status   Campylobacter species NOT DETECTED NOT DETECTED Final   Plesimonas shigelloides NOT DETECTED NOT DETECTED Final   Salmonella species NOT DETECTED NOT DETECTED Final   Yersinia enterocolitica NOT DETECTED NOT DETECTED Final   Vibrio species NOT DETECTED NOT DETECTED Final   Vibrio cholerae NOT DETECTED NOT DETECTED Final   Enteroaggregative E coli (EAEC) NOT DETECTED NOT DETECTED Final   Enteropathogenic E coli (EPEC) NOT DETECTED NOT DETECTED Final   Enterotoxigenic E coli (ETEC) NOT DETECTED NOT DETECTED Final   Shiga like toxin producing E coli (STEC) NOT DETECTED NOT DETECTED Final   Shigella/Enteroinvasive E coli (EIEC) NOT DETECTED NOT DETECTED Final   Cryptosporidium NOT DETECTED NOT DETECTED Final   Cyclospora cayetanensis NOT DETECTED NOT  DETECTED Final   Entamoeba histolytica NOT DETECTED NOT DETECTED Final   Giardia lamblia NOT DETECTED NOT DETECTED Final   Adenovirus F40/41 NOT DETECTED NOT DETECTED Final   Astrovirus NOT DETECTED NOT DETECTED Final   Norovirus GI/GII NOT DETECTED NOT DETECTED Final   Rotavirus A NOT DETECTED NOT DETECTED Final   Sapovirus (I, II, IV, and V) NOT DETECTED NOT DETECTED Final    Comment: Performed at [facility], [address] [place], [place] [address]  C Difficile Quick Screen w PCR reflex     Status: None   Collection Time: 06/13/19  3:35 PM  Result Value Ref Range Status   C Diff antigen NEGATIVE NEGATIVE Final   C Diff toxin NEGATIVE NEGATIVE Final   C Diff interpretation No C. difficile detected.  Final    Comment: Performed at [facility], [address], [place], [place] [address]    Radiology Reports CT ABDOMEN PELVIS W CONTRAST  Result Date: 06/13/2019 CLINICAL DATA:  Diarrhea. EXAM: CT ABDOMEN AND PELVIS WITH CONTRAST TECHNIQUE: Multidetector CT imaging of the abdomen and pelvis was performed using the standard protocol following bolus administration of intravenous contrast. CONTRAST:  17mL OMNIPAQUE IOHEXOL 300 MG/ML  SOLN COMPARISON:  June 04, 2019. FINDINGS: Lower chest: Mild bilateral pleural effusions are noted with adjacent subsegmental atelectasis. Hepatobiliary: Hepatic steatosis is noted. No gallstones are noted. No biliary dilatation is noted. Pancreas: Unremarkable. No pancreatic ductal dilatation or surrounding inflammatory changes. Spleen: Normal in size without focal abnormality. Adrenals/Urinary Tract: Adrenal glands appear normal. Right renal cysts are noted. No renal or ureteral calculi are noted. Minimal left hydroureteronephrosis is noted without obstructing calculus. Urinary bladder is decompressed secondary to Foley catheter. Stomach/Bowel: The stomach appears normal. The appendix is not visualized. Moderate to severe wall thickening is seen  involving the transverse, descending and sigmoid colon and rectum concerning for inflammatory or infectious colitis. There is  no evidence of bowel obstruction. Vascular/Lymphatic: No significant vascular findings are present. No enlarged abdominal or pelvic lymph nodes. Reproductive: Prostate is unremarkable. Other: No abdominal wall hernia or abnormality. No abdominopelvic ascites. Musculoskeletal: No acute or significant osseous findings. IMPRESSION: 1. Hepatic steatosis. 2. Mild bilateral pleural effusions are noted with adjacent subsegmental atelectasis. 3. Minimal left hydroureteronephrosis is noted without obstructing calculus. 4. Moderate to severe wall thickening is seen involving the transverse, descending and sigmoid colon and rectum concerning for inflammatory or infectious colitis. Electronically Signed   By: Marijo Conception M.D.   On: 06/13/2019 12:44   CT Abdomen Pelvis W Contrast  Result Date: 06/04/2019 CLINICAL DATA:  69 year old male with abdominal distension and diarrhea. Concern for acute diverticulitis. EXAM: CT ABDOMEN AND PELVIS WITH CONTRAST TECHNIQUE: Multidetector CT imaging of the abdomen and pelvis was performed using the standard protocol following bolus administration of intravenous contrast. CONTRAST:  135mL OMNIPAQUE IOHEXOL 300 MG/ML  SOLN COMPARISON:  None. FINDINGS: Lower chest: The visualized lung bases are clear. No intra-abdominal free air or free fluid. Hepatobiliary: Severe fatty infiltration of the liver. No intrahepatic biliary ductal dilatation. The gallbladder is unremarkable. Pancreas: Atrophic pancreas. No Accu to inflammatory changes. No dilatation of the main pancreatic duct. Spleen: Normal in size without focal abnormality. Adrenals/Urinary Tract: The adrenal glands are unremarkable. There is mild bilateral hydronephrosis, right greater left. There is enhancement of the right renal urothelium concerning for pyelonephritis. Correlation with urinalysis recommended.  There is slight delayed enhancement of the right renal parenchyma. Multiple right renal cysts measure up to 4 cm in the interpolar aspect of the right kidney. Additional subcentimeter right renal hypodense lesions are too small to characterize. The urinary bladder is distended. Stomach/Bowel: There is narrowing of a segment of the rectosigmoid, likely related to mass effect caused by distended urinary bladder. There is mild thickened appearance of the segment of colon with surrounding stranding. The perisigmoid stranding may be related to UTI but concerning for stercoral colitis. Clinical correlation is recommended. There is probable mild associate luminal narrowing of the sigmoid colon. No evidence of small-bowel obstruction. The appendix is not visualized with certainty. No inflammatory changes identified in the right lower quadrant. Vascular/Lymphatic: The abdominal aorta and IVC unremarkable. No portal venous gas. There is no adenopathy. Reproductive: The prostate and seminal vesicles are grossly unremarkable. Partially visualized fluid within the left inguinal canal likely related to hydrocele. Other: Mild diffuse subcutaneous edema. Musculoskeletal: Degenerative changes of the spine. No acute osseous pathology. IMPRESSION: 1. Distended urinary bladder with mild bilateral hydronephrosis and findings of right-sided pyelonephritis. Correlation with urinalysis recommended. 2. Thickened appearance of the rectosigmoid with surrounding stranding which may be related to UTI but concerning for stercoral colitis. Clinical correlation is recommended. 3. Severe fatty liver. Electronically Signed   By: Anner Crete M.D.   On: 06/04/2019 23:41   US Venous Img Lower Bilateral (DVT)  Result Date: 06/05/2019 CLINICAL DATA:  Bilateral lower extremity pain and edema for 1 month. EXAM: BILATERAL LOWER EXTREMITY VENOUS DOPPLER ULTRASOUND TECHNIQUE: Gray-scale sonography with compression, as well as color and duplex  ultrasound, were performed to evaluate the deep venous system(s) from the level of the common femoral vein through the popliteal and proximal calf veins. COMPARISON:  None. FINDINGS: VENOUS Normal compressibility of the common femoral, superficial femoral, and popliteal veins, as well as the visualized calf veins. Visualized portions of profunda femoral vein and great saphenous vein unremarkable. No filling defects to suggest DVT on grayscale or color Doppler  imaging. Doppler waveforms show normal direction of venous flow, normal respiratory phasicity and response to augmentation. OTHER None. Limitations: none IMPRESSION: No femoropopliteal DVT nor evidence of DVT within the visualized calf veins. If clinical symptoms are inconsistent or if there are persistent or worsening symptoms, further imaging (possibly involving the iliac veins) may be warranted. Electronically Signed   By: Abigail Miyamoto M.D.   On: 06/05/2019 11:44   DG Chest Port 1 View  Result Date: 06/04/2019 CLINICAL DATA:  Tachycardia, bilateral lower extremity edema EXAM: PORTABLE CHEST 1 VIEW COMPARISON:  05/13/2018 FINDINGS: The heart size and mediastinal contours are within normal limits. Both lungs are clear. The visualized skeletal structures are unremarkable. IMPRESSION: No active disease. Electronically Signed   By: Randa Ngo M.D.   On: 06/04/2019 20:32   DG Abd 2 Views  Result Date: 06/11/2019 CLINICAL DATA:  Abdominal pain. EXAM: ABDOMEN - 2 VIEW COMPARISON:  None. FINDINGS: No free air, portal venous gas, or pneumatosis. Numerous air-filled mildly prominent but nondilated loops of small bowel. No gas in the region of the rectum. No other acute abnormalities. IMPRESSION: 1. Numerous mildly prominent loops of small bowel without significant dilatation. Developing ileus favored. Very early small bowel obstruction not definitely excluded despite the lack of definitive dilatation as no definitive colonic gas is noted. Recommend clinical  correlation and attention on follow-up. Electronically Signed   By: Dorise Bullion III M.D   On: 06/11/2019 13:29   ECHOCARDIOGRAM COMPLETE  Result Date: 06/05/2019    ECHOCARDIOGRAM REPORT   Patient Name:   Darren Howard Date of Exam: 06/05/2019 Medical Rec #:  [id number]     Height:       73.0 in Accession #:    [id number]    Weight:       205.0 lb Date of Birth:  11/04/1950    BSA:          2.174 m Patient Age:    12 years      BP:           152/86 mmHg Patient Gender: M             HR:           90 bpm. Exam Location:  [facility] Procedure: 2D Echo, Cardiac Doppler and Color Doppler Indications:    Lower extremity edema [218969]                 Alcohol abuse [253664]  History:        Patient has no prior history of Echocardiogram examinations.                 Risk Factors:Hypertension, Diabetes, Current Smoker and                 Dyslipidemia.  Sonographer:    Alvino Chapel RCS Referring Phys: [place]  1. Left ventricular ejection fraction, by estimation, is 60 to 65%. The left ventricle has normal function. The left ventricle has no regional wall motion abnormalities. There is moderate left ventricular hypertrophy. Left ventricular diastolic parameters were normal.  2. Right ventricular systolic function is normal. The right ventricular size is normal.  3. Left atrial size was mildly dilated.  4. The mitral valve is normal in structure. No evidence of mitral valve regurgitation.  5. The aortic valve is tricuspid. Aortic valve regurgitation is not visualized. No aortic stenosis is present. FINDINGS  Left Ventricle: Left ventricular ejection fraction, by estimation, is 60 to  65%. The left ventricle has normal function. The left ventricle has no regional wall motion abnormalities. The left ventricular internal cavity size was normal in size. There is  moderate left ventricular hypertrophy. Left ventricular diastolic parameters were normal. Right Ventricle: The right ventricular size is  normal. Right vetricular wall thickness was not assessed. Right ventricular systolic function is normal. There is mildly elevated pulmonary artery systolic pressure. The tricuspid regurgitant velocity is 2.55 m/s, and with an assumed right atrial pressure of 8 mmHg, the estimated right ventricular systolic pressure is 01.0 mmHg. Left Atrium: Left atrial size was mildly dilated. Right Atrium: Right atrial size was normal in size. Pericardium: Trivial pericardial effusion is present. Presence of pericardial fat pad. Mitral Valve: The mitral valve is normal in structure. No evidence of mitral valve regurgitation. Tricuspid Valve: The tricuspid valve is normal in structure. Tricuspid valve regurgitation is trivial. Aortic Valve: The aortic valve is tricuspid. Aortic valve regurgitation is not visualized. No aortic stenosis is present. Pulmonic Valve: The pulmonic valve was not well visualized. Pulmonic valve regurgitation is not visualized. Aorta: The aortic root is normal in size and structure. IAS/Shunts: The interatrial septum was not well visualized.  LEFT VENTRICLE PLAX 2D LVIDd:         4.85 cm  Diastology LVIDs:         3.13 cm  LV e' lateral:   10.80 cm/s LV PW:         1.35 cm  LV E/e' lateral: 9.4 LV IVS:        1.41 cm  LV e' medial:    9.03 cm/s LVOT diam:     2.00 cm  LV E/e' medial:  11.2 LV SV:         76 LV SV Index:   35 LVOT Area:     3.14 cm  RIGHT VENTRICLE RV Mid diam:    3.04 cm RV S prime:     19.60 cm/s TAPSE (M-mode): 2.1 cm LEFT ATRIUM             Index LA diam:        4.10 cm 1.89 cm/m LA Vol (A2C):   95.3 ml 43.83 ml/m LA Vol (A4C):   66.2 ml 30.45 ml/m LA Biplane Vol: 80.8 ml 37.16 ml/m  AORTIC VALVE LVOT Vmax:   112.00 cm/s LVOT Vmean:  79.100 cm/s LVOT VTI:    0.241 m  AORTA Ao Root diam: 3.10 cm MITRAL VALVE                TRICUSPID VALVE MV Area (PHT): 3.07 cm     TR Peak grad:   26.0 mmHg MV Decel Time: 247 msec     TR Vmax:        255.00 cm/s MV E velocity: 101.00 cm/s MV A  velocity: 95.10 cm/s   SHUNTS MV E/A ratio:  1.06         Systemic VTI:  0.24 m                             Systemic Diam: 2.00 cm Oswaldo Milian MD Electronically signed by Oswaldo Milian MD Signature Date/Time: 06/05/2019/10:46:22 PM    Final     CBC Recent Labs  Lab 06/10/19 0606 06/11/19 0604 06/13/19 0518 06/14/19 0616 06/15/19 0424  WBC 7.8 11.2* 10.9* 10.0 9.0  HGB 6.9* 8.2* 8.2* 8.5* 8.2*  HCT 21.4* 25.4* 25.3* 25.7* 25.3*  PLT 282  305 293 282 308  MCV 94.7 91.4 90.0 89.2 90.7  MCH 30.5 29.5 29.2 29.5 29.4  MCHC 32.2 32.3 32.4 33.1 32.4  RDW 17.9* 17.6* 17.3* 17.2* 17.0*    Chemistries  Recent Labs  Lab 06/10/19 0606 06/10/19 0606 06/11/19 0604 06/12/19 0640 06/13/19 0518 06/14/19 0616 06/15/19 0424  NA 142   < > 142 143 142 140 139  K 2.5*   < > 2.3* 2.3* 2.4* 2.6* 4.3  CL 119*   < > 118* 119* 118* 118* 120*  CO2 18*   < > 17* 17* 17* 16* 16*  GLUCOSE 111*   < > 146* 96 96 104* 118*  BUN 12   < > 9 7* 6* 5* 5*  CREATININE 1.05   < > 1.04 1.04 0.98 0.88 0.86  CALCIUM 7.0*   < > 7.3* 7.1* 7.2* 7.2* 7.3*  MG 1.6*  --   --  1.6* 1.9 1.7  --   AST  --   --   --   --   --  26 26  ALT  --   --   --   --   --  15 14  ALKPHOS  --   --   --   --   --  97 98  BILITOT  --   --   --   --   --  0.7 0.5   < > = values in this interval not displayed.   ------------------------------------------------------------------------------------------------------------------ No results for input(s): CHOL, HDL, LDLCALC, TRIG, CHOLHDL, LDLDIRECT in the last 72 hours.  Lab Results  Component Value Date   HGBA1C 6.9 (H) 05/13/2018   ------------------------------------------------------------------------------------------------------------------ No results for input(s): TSH, T4TOTAL, T3FREE, THYROIDAB in the last 72 hours.  Invalid input(s): FREET3 ------------------------------------------------------------------------------------------------------------------ No  results for input(s): VITAMINB12, FOLATE, FERRITIN, TIBC, IRON, RETICCTPCT in the last 72 hours.  Coagulation profile No results for input(s): INR, PROTIME in the last 168 hours.  No results for input(s): DDIMER in the last 72 hours.  Cardiac Enzymes No results for input(s): CKMB, TROPONINI, MYOGLOBIN in the last 168 hours.  Invalid input(s): CK ------------------------------------------------------------------------------------------------------------------    Component Value Date/Time   BNP 238.0 (H) 06/05/2019 0421   Barton Dubois M.D on 06/16/2019 at 9:42 AM  Go to [email or web address] - for contact info  [facility] - Office  [phone]

## 2019-06-17 DIAGNOSIS — E876 Hypokalemia: Secondary | ICD-10-CM

## 2019-06-17 LAB — BASIC METABOLIC PANEL
Anion gap: 6 (ref 5–15)
BUN: <5 mg/dL — ABNORMAL LOW (ref 8–23)
CO2: 17 mmol/L — ABNORMAL LOW (ref 22–32)
Calcium: 7.2 mg/dL — ABNORMAL LOW (ref 8.9–10.3)
Chloride: 116 mmol/L — ABNORMAL HIGH (ref 98–111)
Creatinine, Ser: 0.87 mg/dL (ref 0.61–1.24)
GFR calc Af Amer: >60 mL/min (ref >60)
GFR calc non Af Amer: >60 mL/min (ref >60)
Glucose, Bld: 111 mg/dL — ABNORMAL HIGH (ref 70–99)
Potassium: 3 mmol/L — ABNORMAL LOW (ref 3.5–5.1)
Sodium: 139 mmol/L (ref 135–145)

## 2019-06-17 LAB — GLUCOSE, CAPILLARY
Glucose-Capillary: 101 mg/dL — ABNORMAL HIGH (ref 70–99)
Glucose-Capillary: 99 mg/dL (ref 70–99)
Glucose-Capillary: 131 mg/dL — ABNORMAL HIGH (ref 70–99)
Glucose-Capillary: 107 mg/dL — ABNORMAL HIGH (ref 70–99)
Glucose-Capillary: 118 mg/dL — ABNORMAL HIGH (ref 70–99)

## 2019-06-17 LAB — SURGICAL PATHOLOGY

## 2019-06-17 LAB — CBC
HCT: 24.2 % — ABNORMAL LOW (ref 39.0–52.0)
Hemoglobin: 7.8 g/dL — ABNORMAL LOW (ref 13.0–17.0)
MCH: 29.4 pg (ref 26.0–34.0)
MCHC: 32.2 g/dL (ref 30.0–36.0)
MCV: 91.3 fL (ref 80.0–100.0)
Platelets: 324 10*3/uL (ref 150–400)
RBC: 2.65 MIL/uL — ABNORMAL LOW (ref 4.22–5.81)
RDW: 17.2 % — ABNORMAL HIGH (ref 11.5–15.5)
WBC: 7.9 10*3/uL (ref 4.0–10.5)
nRBC: 0 % (ref 0.0–0.2)

## 2019-06-17 MED ORDER — POTASSIUM CHLORIDE CRYS ER 20 MEQ PO TBCR
40.0000 meq | EXTENDED_RELEASE_TABLET | Freq: Every day | ORAL | Status: DC
  Administered 2019-06-17 – 2019-06-20 (×4): 40 meq via ORAL
  Filled 2019-06-17 (×4): qty 2

## 2019-06-17 NOTE — Progress Notes (Signed)
Bladder scanned patient early in shift. Bladder scan showed greater than 200cc. MD made aware. MD said to monitor output. Patient voided 168mls of urine rescanned bladder. Bladder scan showed greater than 200cc of urine. MD made aware. MD gave verbal to do in and out catheter.

## 2019-06-17 NOTE — Progress Notes (Signed)
Subjective: Feeling better. Had a stool accident this morning. States only 2 stools since yesterday, soft but not watery. Denies abdominal pain, N/V. Tolerating diet. No hematochezia or melena. Wanting to get some strength back with PT. No other GI complaints.  Objective: Vital signs in last 24 hours: Temp:  [98.5 F (36.9 C)-99 F (37.2 C)] 99 F (37.2 C) (04/30 0818) Pulse Rate:  [65-79] 67 (04/30 0818) Resp:  [16-19] 16 (04/30 0428) BP: (142-173)/(70-88) 146/71 (04/30 0818) SpO2:  [98 %-100 %] 100 % (04/30 0818) Last BM Date: 06/16/19 General:   Alert and oriented, pleasant Head:  Normocephalic and atraumatic. Eyes:  No icterus, sclera clear. Conjuctiva pink.  Heart:  S1, S2 present, no murmurs noted.  Lungs: Clear to auscultation bilaterally, without wheezing, rales, or rhonchi.  Abdomen:  Bowel sounds present, soft, non-tender, non-distended. No HSM or hernias noted. No rebound or guarding. No masses appreciated  Msk:  Symmetrical without gross deformities. Extremities:  Without clubbing or edema. Neurologic:  Alert and  oriented x4;  grossly normal neurologically. Skin:  Warm and dry, intact without significant lesions.  Psych:  Alert and cooperative. Normal mood and affect.  Intake/Output from previous day: 04/29 0701 - 04/30 0700 In: 960 [P.O.:960] Out: 600 [Urine:600] Intake/Output this shift: No intake/output data recorded.  Lab Results: Recent Labs    06/15/19 0424 06/17/19 0505  WBC 9.0 7.9  HGB 8.2* 7.8*  HCT 25.3* 24.2*  PLT 308 324   BMET Recent Labs    06/15/19 0424 06/17/19 0505  NA 139 139  K 4.3 3.0*  CL 120* 116*  CO2 16* 17*  GLUCOSE 118* 111*  BUN 5* <5*  CREATININE 0.86 0.87  CALCIUM 7.3* 7.2*   LFT Recent Labs    06/15/19 0424  PROT 5.4*  ALBUMIN 2.0*  AST 26  ALT 14  ALKPHOS 98  BILITOT 0.5   PT/INR No results for input(s): LABPROT, INR in the last 72 hours. Hepatitis Panel No results for input(s): HEPBSAG, HCVAB,  HEPAIGM, HEPBIGM in the last 72 hours.   Studies/Results: No results found.  Assessment: 69 year old male with history of alcohol abuse, chronic anemia with blood transfusions on several prior occasions during hospitalization last year, admitted with suspected right-sided pyelonephritis and findings of possible stercoral colitis on CT abdomen/pelvis with contrast but more likely result of bladder distention with impingement on the colon, persistent hypokalemia in the setting of multiple watery stools.  Acute on chronic anemia: Heme positive but no overt GI bleeding.  Has received 2 units of packed red blood cells this admission with lowest hemoglobin of 6.5 prior to transfusion.  H&H has been stable for several days.  EGD last year with erosive gastropathy, duodenal erosions, likely NSAID effect.  Colonoscopy with inadequate prep April 2020 and large polyp removed piecemeal, overdue for early interval surveillance.  Diarrhea with pancolitis on repeat imaging: C. difficile and GI pathogen panel neg April 22.  Multiple antibiotics this admission including cefepime, IV vancomycin, Rocephin, Augmentin, Flagyl.  He was also on Linzess 145 mcg daily which was discontinued June 13, 2019.  Repeat CT abdomen pelvis with contrast on April 26 with progression of colitis from proximal ascending colon to sigmoid colon.  Patient denies abdominal pain, vomiting.  Patient empirically started on oral vancomycin.  Repeat C. difficile negative, GI pathogen panel negative.  Patient completed Wednesday 06/15/19 (prep was poor and inadequate for polyp or small lesion detection).  Mucosa of the colon appeared diffusely edematous but no  erosions, ulcerations, pseudomembranes.  Sigmoid colon appeared to be thickened with a perpendicularly ringed appearance to the mucosa.  Mucosa was pale and appeared to have crpe paper/cobblestoned appearance.  Multiple biopsies taken.  Endoscopically did not look like inflammatory bowel  disease. Biopsies still pending. Diarrhea seems to be slowing. Likely diarrhea multifactorial including previous large doses oral potassium, multiple antibiotics, ETOH withdrawal.   Today only 2 stools, observed stool from "accident" today and brown, soft [place] 5-6, not watery, no blood or melena.  Hypokalemia- initial large losses likely due to severe diarrhea, improved with high dose oral potassium and multiple runs of IV potassium. Potassium today 3.0 and hospitalist has ordered 40 mEq daily for maintenance.  Plan: 1. Continue to follow for path results 2. Monitor for increasing stools 3. Electrolyte replacement per hospitalist 4. Monitor for worsening diarrhea 5. Supportive measures.   Thank you for allowing [place] to participate in the care of Darren Howard, [name], AGNP-C Adult & Gerontological Nurse Practitioner [facility]     LOS: 12 days    06/17/2019, 8:46 AM

## 2019-06-17 NOTE — Care Management Important Message (Signed)
Important Message  Patient Details  Name: Darren Howard MRN: [id number] Date of Birth: [birth year]   Medicare Important Message Given:  Yes     [name] 06/17/2019, 10:44 AM

## 2019-06-17 NOTE — Progress Notes (Signed)
Patient has not urinated since foley catheter removal. Patient denies discomfort at this time. Bladder scan reading 122ml. MD notified. Instructed to perform in and out cath. In and out cath done with no urine drained from bladder at this time.

## 2019-06-17 NOTE — Progress Notes (Signed)
In and out catheter patient. Returned 600cc of urine. Patient tolerated well. Sterile technique maintained

## 2019-06-17 NOTE — Progress Notes (Signed)
Patient Demographics:    Darren Howard, is a 69 y.o. male, DOB - [birth year], JXB:[id number]  Admit date - 06/04/2019   Admitting Physician No admitting provider for patient encounter.  Outpatient Primary MD for the patient is [name], MD  LOS - 12  Chief Complaint  Patient presents with  . Leg Swelling       Subjective:    Darren Howard patient reports still some loose stools overnight; will get them for me.  No chest pain, no fever, no nausea, no vomiting, no shortness of breath.  Denies melena or hematochezia.    Assessment  & Plan :    Principal Problem:   Colitis, acute---??? infectious pancolitis Active Problems:   Hypertension   Normocytic anemia   DM2 (diabetes mellitus, type 2) (HCC)   Hypokalemia   Hypomagnesemia   Hypocalcemia   Alcohol abuse   Tobacco use   Pyelonephritis of right kidney   Pyelonephritis   Abnormal CT scan, colon   Diarrhea   Urinary retention  Brief Summary:- 69 y.o. male with medical history significant of alcohol abuse, chronic normocytic anemia, medication and treatment noncompliance, history of duodenal reduction, erosive gastropathy, GI bleed, glucose intolerance/type 2 diabetes, hyperlipidemia, hypertension admitted on 06/05/2019 with possible right-sided pyelonephritis, as well as stercoral colitis and found to have alcohol withdrawal symptoms  A/p 1)Pan- Colitis--- patient previously received iv Vancomycin, Rocephin and Cefepime , also treated with p.o. Augmentin and p.o. Flagyl during this admission -Currently on oral vancomycin per GI started on 06/13/2018 -Continues to have numerous/frequent greenish mucousy stool---almost jelly appearing stools--- -CT abdomen and pelvis from 06/13/2019 reviewed with radiologist Dr. Thornton Papas, compared to CT from 06/04/2019 --patient now has moderate to severe colitis ranging from the proximal ascending colon,  involving the transverse colon and extending into the descending, sigmoid and colon as well as the rectum (colonic wall thickening is a lot more pronounced) -Discussed with infectious disease specialist Dr. [name] on 06/14/2019---okay to continue oral vanco ordered by GI service, infectious disease recommended colonoscopy/flex sig procedure with biopsy for diagnostic purposes.   -s/p flex on 06/15/19; biopsy results pending -continue oral vanc for now -Continue Anusol AC for hemorrhoids  -WBC is around 9; patient afebrile and so far C. difficile by PCR and GI pathogen negative.  2)Severe--- persistent electrolyte abnormalities including- hypocalcemia/hypokalemia/hypomagnesemia and hypoalbuminemia- --due to frequent BMs/GI losses and alcohol use ---replacing electrolytes iv and orally -Potassium was down to 2.6; after repletion went up to 4.0 while actively receiving IV maintenance; after stopping supplementation and assessing patient ability to maintain oral intake and the stability of his electrolytes potassium down to 3.0. -Will replete orally and follow trend. -Telemetry discontinued on 06/16/2019.  3)Acute symptomatic blood loss anemia superimposed on chronic blood loss anemia ----admission hemoglobin was  6.5 , initially received 1 unit of blood on 06/06/2019, and another unit on 06/10/2019 -Hemoglobin up to 8.5 after transfusion. Currently 8.2 -No further signs of ongoing bleeding appreciated. -Continue to follow hemoglobin trend.  -Diagnostic flexible sigmoidoscopy done on 4/28; no active bleeding appreciated.  4)DM2--last A1c 6.9 reflecting excellent DM control PTA -Continue to hold oral hypoglycemic agents. -Continue sliding scale insulin. -Follow CBGs and nodules management while inpatient.   5)- Urinary retention-- -Unable to void  after removal of Foley catheter --Patient failed voiding trial on 06/09/2019 -Second trial ongoing; so far has required in and out cath x2. -Will continue  evaluating and if he fails attempt to remove Foley will replace with intention to keep catheter and follow-up as an outpatient with urology service. -Continue Flomax. -Denies dysuria.  6)HTN--stable, and well-controlled currently -Continue current dose of carvedilol and daily amlodipine. -Plan is to resume heart healthy diet when properly able to tolerate by mouth.  7)Alcohol Abuse with DTs--- DT  resolved,  --continue folic acid, thiamine/multivitamin  -Patient is coherent and able to follow commands appropriately. -Cessation counseling has been provided.  Disposition:  -Patient From: home and anticipate discharge home with [facility] services once medically stable.   D/C Place: home   Barriers: Not Clinically Stable- -improvement in his diarrhea reported.  No nausea or vomiting.  Denies abdominal pain.  Diet has been advanced to soft diet; so far tolerating well.  We will continue the use of Florastor and continue oral vancomycin currently.  We will continue repleting electrolytes as needed, follow biopsy results from sigmoidoscopy and follow further GI recommendations.  Given dropped in his potassium and reports loose stools overnight will observe for another 24 hours.  Hopefully stable to go home on 06/18/2019.  Code Status : full  Family Communication:   -Discussed with son [name] on 06/13/19 at bedside  Consults  : GI and phone consult with ID physician Dr. Drucilla Schmidt - Procedure:- -Diagnostic flex sig with biopsy planned for 06/15/2019  DVT Prophylaxis  :   - SCDs   Lab Results  Component Value Date   PLT 324 06/17/2019   Inpatient Medications  Scheduled Meds: . amLODipine  5 mg Oral Daily  . carvedilol  12.5 mg Oral BID  . Chlorhexidine Gluconate Cloth  6 each Topical Daily  . folic acid  1 mg Oral Daily  . Gerhardt's butt cream   Topical BID  . hydrocortisone   Rectal QID  . multivitamin with minerals  1 tablet Oral Daily  . pantoprazole  40 mg Oral Daily  . potassium chloride   40 mEq Oral Daily  . saccharomyces boulardii  250 mg Oral BID  . tamsulosin  0.4 mg Oral BID  . thiamine  100 mg Oral Daily   Or  . thiamine  100 mg Intravenous Daily  . vancomycin  125 mg Oral TID WC & HS   Continuous Infusions:  PRN Meds:.traMADol  Anti-infectives (From admission, onward)   Start     Dose/Rate Route Frequency Ordered Stop   06/13/19 1515  vancomycin (VANCOCIN) 50 mg/mL oral solution 125 mg     125 mg Oral 3 times daily with meals & bedtime 06/13/19 1508     06/09/19 1800  metroNIDAZOLE (FLAGYL) tablet 500 mg     500 mg Oral 3 times daily 06/09/19 1745 06/12/19 2112   06/07/19 2130  rifaximin (XIFAXAN) tablet 550 mg  Status:  Discontinued     550 mg Oral 2 times daily 06/07/19 2119 06/13/19 1502   06/06/19 2200  amoxicillin-clavulanate (AUGMENTIN) 875-125 MG per tablet 1 tablet     1 tablet Oral Every 12 hours 06/06/19 1822 06/12/19 2112   06/05/19 0645  metroNIDAZOLE (FLAGYL) IVPB 500 mg     500 mg 100 mL/hr over 60 Minutes Intravenous  Once 06/05/19 0640 06/05/19 0954   06/05/19 0600  ceFEPIme (MAXIPIME) 2 g in sodium chloride 0.9 % 100 mL IVPB  Status:  Discontinued  2 g 200 mL/hr over 30 Minutes Intravenous Every 8 hours 06/05/19 0217 06/05/19 0357   06/05/19 0400  cefTRIAXone (ROCEPHIN) 1 g in sodium chloride 0.9 % 100 mL IVPB  Status:  Discontinued     1 g 200 mL/hr over 30 Minutes Intravenous Every 24 hours 06/05/19 0357 06/06/19 1822   06/04/19 2130  vancomycin (VANCOREADY) IVPB 2000 mg/400 mL     2,000 mg 200 mL/hr over 120 Minutes Intravenous  Once 06/04/19 2130 06/05/19 0216   06/04/19 2115  ceFEPIme (MAXIPIME) 2 g in sodium chloride 0.9 % 100 mL IVPB     2 g 200 mL/hr over 30 Minutes Intravenous  Once 06/04/19 2105 06/04/19 2226   06/04/19 2115  metroNIDAZOLE (FLAGYL) IVPB 500 mg     500 mg 100 mL/hr over 60 Minutes Intravenous  Once 06/04/19 2105 06/05/19 0028   06/04/19 2115  vancomycin (VANCOCIN) IVPB 1000 mg/200 mL premix  Status:   Discontinued     1,000 mg 200 mL/hr over 60 Minutes Intravenous  Once 06/04/19 2105 06/04/19 2130        Objective:   Vitals:   06/16/19 1938 06/16/19 2045 06/17/19 0428 06/17/19 0818  BP:  (!) 173/83 (!) 143/70 (!) 146/71  Pulse:  79 72 67  Resp:  16 16   Temp:  98.9 F (37.2 C) 98.6 F (37 C) 99 F (37.2 C)  TempSrc:  Oral Oral Oral  SpO2: 100% 100% 100% 100%  Weight:      Height:        Wt Readings from Last 3 Encounters:  06/08/19 98.7 kg  05/31/18 100 kg  05/17/18 93.5 kg    Intake/Output Summary (Last 24 hours) at 06/17/2019 1233 Last data filed at 06/17/2019 1020 Gross per 24 hour  Intake 960 ml  Output 600 ml  Net 360 ml   Physical Exam General exam: Alert, awake, oriented x 3; no chest pain, no nausea, no vomiting.  Reports no abdominal pain.  Able to urinate some but is still on bladder scan demonstrating more than 200 cc.  On physical examination a slight induration in suprapubic area appreciated.  Patient reports to loose stools overnight.  Remains afebrile Respiratory system: Clear to auscultation. Respiratory effort normal. Cardiovascular system:RRR. No murmurs, rubs, gallops. Gastrointestinal system: Abdomen is nondistended, soft and nontender. No organomegaly or masses felt. Normal bowel sounds heard. Central nervous system: Alert and oriented. No focal neurological deficits. Extremities: No C/C/E, +pedal pulses Skin: No rashes, lesions or ulcers Psychiatry: Judgement and insight appear normal. Mood & affect appropriate.     Data Review:   Micro Results Recent Results (from the past 240 hour(s))  Gastrointestinal Panel by PCR , Stool     Status: None   Collection Time: 06/09/19  2:12 PM   Specimen: Stool  Result Value Ref Range Status   Campylobacter species NOT DETECTED NOT DETECTED Final   Plesimonas shigelloides NOT DETECTED NOT DETECTED Final   Salmonella species NOT DETECTED NOT DETECTED Final   Yersinia enterocolitica NOT DETECTED NOT  DETECTED Final   Vibrio species NOT DETECTED NOT DETECTED Final   Vibrio cholerae NOT DETECTED NOT DETECTED Final   Enteroaggregative E coli (EAEC) NOT DETECTED NOT DETECTED Final   Enteropathogenic E coli (EPEC) NOT DETECTED NOT DETECTED Final   Enterotoxigenic E coli (ETEC) NOT DETECTED NOT DETECTED Final   Shiga like toxin producing E coli (STEC) NOT DETECTED NOT DETECTED Final   Shigella/Enteroinvasive E coli (EIEC) NOT DETECTED NOT DETECTED Final  Cryptosporidium NOT DETECTED NOT DETECTED Final   Cyclospora cayetanensis NOT DETECTED NOT DETECTED Final   Entamoeba histolytica NOT DETECTED NOT DETECTED Final   Giardia lamblia NOT DETECTED NOT DETECTED Final   Adenovirus F40/41 NOT DETECTED NOT DETECTED Final   Astrovirus NOT DETECTED NOT DETECTED Final   Norovirus GI/GII NOT DETECTED NOT DETECTED Final   Rotavirus A NOT DETECTED NOT DETECTED Final   Sapovirus (I, II, IV, and V) NOT DETECTED NOT DETECTED Final    Comment: Performed at [facility], [address] [place], [place] [address]  C Difficile Quick Screen (NO PCR Reflex)     Status: None   Collection Time: 06/09/19  2:13 PM   Specimen: Stool  Result Value Ref Range Status   C Diff antigen NEGATIVE NEGATIVE Final   C Diff toxin NEGATIVE NEGATIVE Final   C Diff interpretation No C. difficile detected.  Final    Comment: Performed at [facility], [address] [place], [place] [address]  Gastrointestinal Panel by PCR , Stool     Status: None   Collection Time: 06/13/19  3:35 PM   Specimen: Stool  Result Value Ref Range Status   Campylobacter species NOT DETECTED NOT DETECTED Final   Plesimonas shigelloides NOT DETECTED NOT DETECTED Final   Salmonella species NOT DETECTED NOT DETECTED Final   Yersinia enterocolitica NOT DETECTED NOT DETECTED Final   Vibrio species NOT DETECTED NOT DETECTED Final   Vibrio cholerae NOT DETECTED NOT DETECTED Final   Enteroaggregative E coli (EAEC) NOT DETECTED NOT DETECTED Final    Enteropathogenic E coli (EPEC) NOT DETECTED NOT DETECTED Final   Enterotoxigenic E coli (ETEC) NOT DETECTED NOT DETECTED Final   Shiga like toxin producing E coli (STEC) NOT DETECTED NOT DETECTED Final   Shigella/Enteroinvasive E coli (EIEC) NOT DETECTED NOT DETECTED Final   Cryptosporidium NOT DETECTED NOT DETECTED Final   Cyclospora cayetanensis NOT DETECTED NOT DETECTED Final   Entamoeba histolytica NOT DETECTED NOT DETECTED Final   Giardia lamblia NOT DETECTED NOT DETECTED Final   Adenovirus F40/41 NOT DETECTED NOT DETECTED Final   Astrovirus NOT DETECTED NOT DETECTED Final   Norovirus GI/GII NOT DETECTED NOT DETECTED Final   Rotavirus A NOT DETECTED NOT DETECTED Final   Sapovirus (I, II, IV, and V) NOT DETECTED NOT DETECTED Final    Comment: Performed at [facility], [address] [place], [place] [address]  C Difficile Quick Screen w PCR reflex     Status: None   Collection Time: 06/13/19  3:35 PM  Result Value Ref Range Status   C Diff antigen NEGATIVE NEGATIVE Final   C Diff toxin NEGATIVE NEGATIVE Final   C Diff interpretation No C. difficile detected.  Final    Comment: Performed at [facility], [address], [place], [place] [address]    Radiology Reports CT ABDOMEN PELVIS W CONTRAST  Result Date: 06/13/2019 CLINICAL DATA:  Diarrhea. EXAM: CT ABDOMEN AND PELVIS WITH CONTRAST TECHNIQUE: Multidetector CT imaging of the abdomen and pelvis was performed using the standard protocol following bolus administration of intravenous contrast. CONTRAST:  149mL OMNIPAQUE IOHEXOL 300 MG/ML  SOLN COMPARISON:  June 04, 2019. FINDINGS: Lower chest: Mild bilateral pleural effusions are noted with adjacent subsegmental atelectasis. Hepatobiliary: Hepatic steatosis is noted. No gallstones are noted. No biliary dilatation is noted. Pancreas: Unremarkable. No pancreatic ductal dilatation or surrounding inflammatory changes. Spleen: Normal in size without focal abnormality.  Adrenals/Urinary Tract: Adrenal glands appear normal. Right renal cysts are noted.  No renal or ureteral calculi are noted. Minimal left hydroureteronephrosis is noted without obstructing calculus. Urinary bladder is decompressed secondary to Foley catheter. Stomach/Bowel: The stomach appears normal. The appendix is not visualized. Moderate to severe wall thickening is seen involving the transverse, descending and sigmoid colon and rectum concerning for inflammatory or infectious colitis. There is no evidence of bowel obstruction. Vascular/Lymphatic: No significant vascular findings are present. No enlarged abdominal or pelvic lymph nodes. Reproductive: Prostate is unremarkable. Other: No abdominal wall hernia or abnormality. No abdominopelvic ascites. Musculoskeletal: No acute or significant osseous findings. IMPRESSION: 1. Hepatic steatosis. 2. Mild bilateral pleural effusions are noted with adjacent subsegmental atelectasis. 3. Minimal left hydroureteronephrosis is noted without obstructing calculus. 4. Moderate to severe wall thickening is seen involving the transverse, descending and sigmoid colon and rectum concerning for inflammatory or infectious colitis. Electronically Signed   By: Marijo Conception M.D.   On: 06/13/2019 12:44   CT Abdomen Pelvis W Contrast  Result Date: 06/04/2019 CLINICAL DATA:  69 year old male with abdominal distension and diarrhea. Concern for acute diverticulitis. EXAM: CT ABDOMEN AND PELVIS WITH CONTRAST TECHNIQUE: Multidetector CT imaging of the abdomen and pelvis was performed using the standard protocol following bolus administration of intravenous contrast. CONTRAST:  135mL OMNIPAQUE IOHEXOL 300 MG/ML  SOLN COMPARISON:  None. FINDINGS: Lower chest: The visualized lung bases are clear. No intra-abdominal free air or free fluid. Hepatobiliary: Severe fatty infiltration of the liver. No intrahepatic biliary ductal dilatation. The gallbladder is unremarkable. Pancreas: Atrophic  pancreas. No Accu to inflammatory changes. No dilatation of the main pancreatic duct. Spleen: Normal in size without focal abnormality. Adrenals/Urinary Tract: The adrenal glands are unremarkable. There is mild bilateral hydronephrosis, right greater left. There is enhancement of the right renal urothelium concerning for pyelonephritis. Correlation with urinalysis recommended. There is slight delayed enhancement of the right renal parenchyma. Multiple right renal cysts measure up to 4 cm in the interpolar aspect of the right kidney. Additional subcentimeter right renal hypodense lesions are too small to characterize. The urinary bladder is distended. Stomach/Bowel: There is narrowing of a segment of the rectosigmoid, likely related to mass effect caused by distended urinary bladder. There is mild thickened appearance of the segment of colon with surrounding stranding. The perisigmoid stranding may be related to UTI but concerning for stercoral colitis. Clinical correlation is recommended. There is probable mild associate luminal narrowing of the sigmoid colon. No evidence of small-bowel obstruction. The appendix is not visualized with certainty. No inflammatory changes identified in the right lower quadrant. Vascular/Lymphatic: The abdominal aorta and IVC unremarkable. No portal venous gas. There is no adenopathy. Reproductive: The prostate and seminal vesicles are grossly unremarkable. Partially visualized fluid within the left inguinal canal likely related to hydrocele. Other: Mild diffuse subcutaneous edema. Musculoskeletal: Degenerative changes of the spine. No acute osseous pathology. IMPRESSION: 1. Distended urinary bladder with mild bilateral hydronephrosis and findings of right-sided pyelonephritis. Correlation with urinalysis recommended. 2. Thickened appearance of the rectosigmoid with surrounding stranding which may be related to UTI but concerning for stercoral colitis. Clinical correlation is  recommended. 3. Severe fatty liver. Electronically Signed   By: Anner Crete M.D.   On: 06/04/2019 23:41   US Venous Img Lower Bilateral (DVT)  Result Date: 06/05/2019 CLINICAL DATA:  Bilateral lower extremity pain and edema for 1 month. EXAM: BILATERAL LOWER EXTREMITY VENOUS DOPPLER ULTRASOUND TECHNIQUE: Gray-scale sonography with compression, as well as color and duplex ultrasound, were performed to evaluate the deep venous system(s) from the level  of the common femoral vein through the popliteal and proximal calf veins. COMPARISON:  None. FINDINGS: VENOUS Normal compressibility of the common femoral, superficial femoral, and popliteal veins, as well as the visualized calf veins. Visualized portions of profunda femoral vein and great saphenous vein unremarkable. No filling defects to suggest DVT on grayscale or color Doppler imaging. Doppler waveforms show normal direction of venous flow, normal respiratory phasicity and response to augmentation. OTHER None. Limitations: none IMPRESSION: No femoropopliteal DVT nor evidence of DVT within the visualized calf veins. If clinical symptoms are inconsistent or if there are persistent or worsening symptoms, further imaging (possibly involving the iliac veins) may be warranted. Electronically Signed   By: Abigail Miyamoto M.D.   On: 06/05/2019 11:44   DG Chest Port 1 View  Result Date: 06/04/2019 CLINICAL DATA:  Tachycardia, bilateral lower extremity edema EXAM: PORTABLE CHEST 1 VIEW COMPARISON:  05/13/2018 FINDINGS: The heart size and mediastinal contours are within normal limits. Both lungs are clear. The visualized skeletal structures are unremarkable. IMPRESSION: No active disease. Electronically Signed   By: Randa Ngo M.D.   On: 06/04/2019 20:32   DG Abd 2 Views  Result Date: 06/11/2019 CLINICAL DATA:  Abdominal pain. EXAM: ABDOMEN - 2 VIEW COMPARISON:  None. FINDINGS: No free air, portal venous gas, or pneumatosis. Numerous air-filled mildly  prominent but nondilated loops of small bowel. No gas in the region of the rectum. No other acute abnormalities. IMPRESSION: 1. Numerous mildly prominent loops of small bowel without significant dilatation. Developing ileus favored. Very early small bowel obstruction not definitely excluded despite the lack of definitive dilatation as no definitive colonic gas is noted. Recommend clinical correlation and attention on follow-up. Electronically Signed   By: Dorise Bullion III M.D   On: 06/11/2019 13:29   ECHOCARDIOGRAM COMPLETE  Result Date: 06/05/2019    ECHOCARDIOGRAM REPORT   Patient Name:   JASEAN AMBROSIA Date of Exam: 06/05/2019 Medical Rec #:  [id number]     Height:       73.0 in Accession #:    [id number]    Weight:       205.0 lb Date of Birth:  1950-03-13    BSA:          2.174 m Patient Age:    68 years      BP:           152/86 mmHg Patient Gender: M             HR:           90 bpm. Exam Location:  [facility] Procedure: 2D Echo, Cardiac Doppler and Color Doppler Indications:    Lower extremity edema [218969]                 Alcohol abuse [185631]  History:        Patient has no prior history of Echocardiogram examinations.                 Risk Factors:Hypertension, Diabetes, Current Smoker and                 Dyslipidemia.  Sonographer:    Alvino Chapel RCS Referring Phys: [place]  1. Left ventricular ejection fraction, by estimation, is 60 to 65%. The left ventricle has normal function. The left ventricle has no regional wall motion abnormalities. There is moderate left ventricular hypertrophy. Left ventricular diastolic parameters were normal.  2. Right ventricular systolic function is normal. The right ventricular  size is normal.  3. Left atrial size was mildly dilated.  4. The mitral valve is normal in structure. No evidence of mitral valve regurgitation.  5. The aortic valve is tricuspid. Aortic valve regurgitation is not visualized. No aortic stenosis is present. FINDINGS   Left Ventricle: Left ventricular ejection fraction, by estimation, is 60 to 65%. The left ventricle has normal function. The left ventricle has no regional wall motion abnormalities. The left ventricular internal cavity size was normal in size. There is  moderate left ventricular hypertrophy. Left ventricular diastolic parameters were normal. Right Ventricle: The right ventricular size is normal. Right vetricular wall thickness was not assessed. Right ventricular systolic function is normal. There is mildly elevated pulmonary artery systolic pressure. The tricuspid regurgitant velocity is 2.55 m/s, and with an assumed right atrial pressure of 8 mmHg, the estimated right ventricular systolic pressure is 68.3 mmHg. Left Atrium: Left atrial size was mildly dilated. Right Atrium: Right atrial size was normal in size. Pericardium: Trivial pericardial effusion is present. Presence of pericardial fat pad. Mitral Valve: The mitral valve is normal in structure. No evidence of mitral valve regurgitation. Tricuspid Valve: The tricuspid valve is normal in structure. Tricuspid valve regurgitation is trivial. Aortic Valve: The aortic valve is tricuspid. Aortic valve regurgitation is not visualized. No aortic stenosis is present. Pulmonic Valve: The pulmonic valve was not well visualized. Pulmonic valve regurgitation is not visualized. Aorta: The aortic root is normal in size and structure. IAS/Shunts: The interatrial septum was not well visualized.  LEFT VENTRICLE PLAX 2D LVIDd:         4.85 cm  Diastology LVIDs:         3.13 cm  LV e' lateral:   10.80 cm/s LV PW:         1.35 cm  LV E/e' lateral: 9.4 LV IVS:        1.41 cm  LV e' medial:    9.03 cm/s LVOT diam:     2.00 cm  LV E/e' medial:  11.2 LV SV:         76 LV SV Index:   35 LVOT Area:     3.14 cm  RIGHT VENTRICLE RV Mid diam:    3.04 cm RV S prime:     19.60 cm/s TAPSE (M-mode): 2.1 cm LEFT ATRIUM             Index LA diam:        4.10 cm 1.89 cm/m LA Vol (A2C):   95.3  ml 43.83 ml/m LA Vol (A4C):   66.2 ml 30.45 ml/m LA Biplane Vol: 80.8 ml 37.16 ml/m  AORTIC VALVE LVOT Vmax:   112.00 cm/s LVOT Vmean:  79.100 cm/s LVOT VTI:    0.241 m  AORTA Ao Root diam: 3.10 cm MITRAL VALVE                TRICUSPID VALVE MV Area (PHT): 3.07 cm     TR Peak grad:   26.0 mmHg MV Decel Time: 247 msec     TR Vmax:        255.00 cm/s MV E velocity: 101.00 cm/s MV A velocity: 95.10 cm/s   SHUNTS MV E/A ratio:  1.06         Systemic VTI:  0.24 m                             Systemic Diam: 2.00 cm Oswaldo Milian MD  Electronically signed by Oswaldo Milian MD Signature Date/Time: 06/05/2019/10:46:22 PM    Final     CBC Recent Labs  Lab 06/11/19 7616 06/13/19 0518 06/14/19 0616 06/15/19 0424 06/17/19 0505  WBC 11.2* 10.9* 10.0 9.0 7.9  HGB 8.2* 8.2* 8.5* 8.2* 7.8*  HCT 25.4* 25.3* 25.7* 25.3* 24.2*  PLT 305 293 282 308 324  MCV 91.4 90.0 89.2 90.7 91.3  MCH 29.5 29.2 29.5 29.4 29.4  MCHC 32.3 32.4 33.1 32.4 32.2  RDW 17.6* 17.3* 17.2* 17.0* 17.2*    Chemistries  Recent Labs  Lab 06/12/19 0640 06/13/19 0518 06/14/19 0616 06/15/19 0424 06/17/19 0505  NA 143 142 140 139 139  K 2.3* 2.4* 2.6* 4.3 3.0*  CL 119* 118* 118* 120* 116*  CO2 17* 17* 16* 16* 17*  GLUCOSE 96 96 104* 118* 111*  BUN 7* 6* 5* 5* <5*  CREATININE 1.04 0.98 0.88 0.86 0.87  CALCIUM 7.1* 7.2* 7.2* 7.3* 7.2*  MG 1.6* 1.9 1.7  --   --   AST  --   --  26 26  --   ALT  --   --  15 14  --   ALKPHOS  --   --  97 98  --   BILITOT  --   --  0.7 0.5  --    ------------------------------------------------------------------------------------------------------------------ No results for input(s): CHOL, HDL, LDLCALC, TRIG, CHOLHDL, LDLDIRECT in the last 72 hours.  Lab Results  Component Value Date   HGBA1C 6.9 (H) 05/13/2018   ------------------------------------------------------------------------------------------------------------------ No results for input(s): TSH, T4TOTAL, T3FREE,  THYROIDAB in the last 72 hours.  Invalid input(s): FREET3 ------------------------------------------------------------------------------------------------------------------ No results for input(s): VITAMINB12, FOLATE, FERRITIN, TIBC, IRON, RETICCTPCT in the last 72 hours.  Coagulation profile No results for input(s): INR, PROTIME in the last 168 hours.  No results for input(s): DDIMER in the last 72 hours.  Cardiac Enzymes No results for input(s): CKMB, TROPONINI, MYOGLOBIN in the last 168 hours.  Invalid input(s): CK ------------------------------------------------------------------------------------------------------------------    Component Value Date/Time   BNP 238.0 (H) 06/05/2019 0421   Barton Dubois M.D on 06/17/2019 at 9:42 AM  Go to [email or web address] - for contact info  [facility] - Office  [phone]

## 2019-06-17 NOTE — Evaluation (Signed)
Physical Therapy Evaluation Patient Details Name: Darren Howard MRN: [id number] DOB: [birth year] Today's Date: 06/17/2019   History of Present Illness  Darren Howard is a 69 y.o. male with medical history significant of alcohol abuse, chronic normocytic anemia, medication and treatment noncompliance, history of duodenal reduction, erosive gastropathy, GI bleed, glucose intolerance/type 2 diabetes, hyperlipidemia, hypertension who is coming to the emergency department due to bilateral lower extremities edema for several weeks, diarrhea with left lower quadrant tenderness since last week and dysuria, frequency and urgency for the past 3 days.  He mentioned initially to the staff that he has not taking his medications because he is "too lazy".  He also stated that he drinks a couple of shots every day.  He denies fever, chills, night sweats, sore throat, wheezing or hemoptysis.  No chest pain, palpitations, diaphoresis, PND, but complains of orthopnea and lower extremity edema.  He denies polyuria, polydipsia, polyphagia or blurred vision.    Clinical Impression  Patient limited for functional mobility as stated below secondary to BLE weakness, fatigue and poor standing balance. Patient able to complete bed mobility without physical assist. Upon sitting EOB, patient has a bowel movement and transitions to standing to clean up. Patient ambulates into bathroom and transfers to toilet where he demonstrates good sitting tolerance/ balance to clean himself up. Patient ambulates with slow cadence without loss of balance. Patient tolerates sitting in chair at end of session. He demonstrates good dynamic sitting balance while completing exercises at edge of chair and is educated on completing while in hospital and upon returning home. Patient will benefit from continued physical therapy in hospital and recommended venue below to increase strength, balance, endurance for safe ADLs and gait.     Follow Up  Recommendations Home health PT;Supervision for mobility/OOB;Supervision - Intermittent    Equipment Recommendations  Rolling walker with 5" wheels    Recommendations for Other Services       Precautions / Restrictions Precautions Precautions: Fall Restrictions Weight Bearing Restrictions: No      Mobility  Bed Mobility Overal bed mobility: Needs Assistance Bed Mobility: Supine to Sit     Supine to sit: Modified independent (Device/Increase time)     General bed mobility comments: slightly increased time  Transfers Overall transfer level: Needs assistance Equipment used: Rolling walker (2 wheeled) Transfers: Sit to/from [facility] Sit to Stand: Min guard Stand pivot transfers: Min guard       General transfer comment: min guard due to LE stiffness and slightly unsteady upon standing, slow, labored movement with RW, able to transfer to/ from bed, chair and toilet  Ambulation/Gait Ambulation/Gait assistance: Supervision Gait Distance (Feet): 100 Feet Assistive device: Rolling walker (2 wheeled) Gait Pattern/deviations: Decreased step length - right;Decreased step length - left;Decreased stride length Gait velocity: decreased   General Gait Details: Patient ambulates with decreased cadence with RW, no loss of balance  Stairs            Wheelchair Mobility    Modified Rankin (Stroke Patients Only)       Balance Overall balance assessment: Needs assistance Sitting-balance support: Feet supported;No upper extremity supported Sitting balance-Leahy Scale: Good Sitting balance - Comments: seated at EOB   Standing balance support: During functional activity;Bilateral upper extremity supported Standing balance-Leahy Scale: Fair Standing balance comment: using RW                             Pertinent Vitals/Pain Pain Assessment: Faces  Faces Pain Scale: Hurts a little bit Pain Location: bilateral feet, knees Pain Descriptors /  Indicators: Sore;Discomfort Pain Intervention(s): Limited activity within patient's tolerance;Monitored during session;Repositioned    Home Living Family/patient expects to be discharged to:: Private residence Living Arrangements: Alone Available Help at Discharge: Family;Available PRN/intermittently Type of Home: House Home Access: Stairs to enter Entrance Stairs-Rails: None Entrance Stairs-Number of Steps: 3 Home Layout: One level Home Equipment: Bedside commode      Prior Function Level of Independence: Independent         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, drives     Hand Dominance   Dominant Hand: Right    Extremity/Trunk Assessment   Upper Extremity Assessment Upper Extremity Assessment: Generalized weakness    Lower Extremity Assessment Lower Extremity Assessment: Generalized weakness    Cervical / Trunk Assessment Cervical / Trunk Assessment: Normal  Communication   Communication: No difficulties  Cognition Arousal/Alertness: Awake/alert Behavior During Therapy: WFL for tasks assessed/performed Overall Cognitive Status: Within Functional Limits for tasks assessed                                        General Comments      Exercises General Exercises - Lower Extremity Long Arc Quad: Seated;AROM;Strengthening;Both;15 reps Hip Flexion/Marching: Seated;AROM;Strengthening;Both;15 reps Toe Raises: Seated;AROM;Strengthening;Both;15 reps Heel Raises: Seated;AROM;Strengthening;Both;15 reps   Assessment/Plan    PT Assessment Patient needs continued PT services  PT Problem List Decreased strength;Decreased activity tolerance;Decreased balance;Decreased mobility       PT Treatment Interventions      PT Goals (Current goals can be found in the Care Plan section)  Acute Rehab PT Goals Patient Stated Goal: return home with family to assist PT Goal Formulation: With patient Time For Goal Achievement: 06/24/19 Potential to Achieve Goals:  Good    Frequency Min 3X/week   Barriers to discharge        Co-evaluation               AM-PAC PT "6 Clicks" Mobility  Outcome Measure Help needed turning from your back to your side while in a flat bed without using bedrails?: None Help needed moving from lying on your back to sitting on the side of a flat bed without using bedrails?: A Little Help needed moving to and from a bed to a chair (including a wheelchair)?: A Little Help needed standing up from a chair using your arms (e.g., wheelchair or bedside chair)?: A Little Help needed to walk in hospital room?: A Little Help needed climbing 3-5 steps with a railing? : A Little 6 Click Score: 19    End of Session Equipment Utilized During Treatment: Gait belt Activity Tolerance: Patient tolerated treatment well;Patient limited by fatigue Patient left: in chair;with call bell/phone within reach;with chair alarm set Nurse Communication: Mobility status PT Visit Diagnosis: Unsteadiness on feet (R26.81);Other abnormalities of gait and mobility (R26.89);Muscle weakness (generalized) (M62.81)    Time: 0932-3557 PT Time Calculation (min) (ACUTE ONLY): 35 min   Charges:   PT Evaluation $PT Eval Moderate Complexity: 1 Mod PT Treatments $Therapeutic Exercise: 8-22 mins $Therapeutic Activity: 8-22 mins        9:58 AM, 06/17/19 Mearl Latin PT, DPT Physical Therapist at [facility]

## 2019-06-18 DIAGNOSIS — R109 Unspecified abdominal pain: Secondary | ICD-10-CM

## 2019-06-18 DIAGNOSIS — F1010 Alcohol abuse, uncomplicated: Secondary | ICD-10-CM

## 2019-06-18 DIAGNOSIS — Z720 Tobacco use: Secondary | ICD-10-CM

## 2019-06-18 DIAGNOSIS — D649 Anemia, unspecified: Secondary | ICD-10-CM

## 2019-06-18 DIAGNOSIS — E1122 Type 2 diabetes mellitus with diabetic chronic kidney disease: Secondary | ICD-10-CM

## 2019-06-18 DIAGNOSIS — E876 Hypokalemia: Secondary | ICD-10-CM

## 2019-06-18 DIAGNOSIS — I10 Essential (primary) hypertension: Secondary | ICD-10-CM

## 2019-06-18 DIAGNOSIS — R197 Diarrhea, unspecified: Secondary | ICD-10-CM

## 2019-06-18 DIAGNOSIS — K529 Noninfective gastroenteritis and colitis, unspecified: Secondary | ICD-10-CM

## 2019-06-18 DIAGNOSIS — R933 Abnormal findings on diagnostic imaging of other parts of digestive tract: Secondary | ICD-10-CM

## 2019-06-18 DIAGNOSIS — R339 Retention of urine, unspecified: Secondary | ICD-10-CM

## 2019-06-18 DIAGNOSIS — N182 Chronic kidney disease, stage 2 (mild): Secondary | ICD-10-CM

## 2019-06-18 LAB — GLUCOSE, CAPILLARY
Glucose-Capillary: 107 mg/dL — ABNORMAL HIGH (ref 70–99)
Glucose-Capillary: 112 mg/dL — ABNORMAL HIGH (ref 70–99)
Glucose-Capillary: 98 mg/dL (ref 70–99)
Glucose-Capillary: 136 mg/dL — ABNORMAL HIGH (ref 70–99)

## 2019-06-18 LAB — BASIC METABOLIC PANEL
Anion gap: 7 (ref 5–15)
BUN: <5 mg/dL — ABNORMAL LOW (ref 8–23)
CO2: 18 mmol/L — ABNORMAL LOW (ref 22–32)
Calcium: 7.4 mg/dL — ABNORMAL LOW (ref 8.9–10.3)
Chloride: 117 mmol/L — ABNORMAL HIGH (ref 98–111)
Creatinine, Ser: 0.84 mg/dL (ref 0.61–1.24)
GFR calc Af Amer: >60 mL/min (ref >60)
GFR calc non Af Amer: >60 mL/min (ref >60)
Glucose, Bld: 113 mg/dL — ABNORMAL HIGH (ref 70–99)
Potassium: 3.4 mmol/L — ABNORMAL LOW (ref 3.5–5.1)
Sodium: 142 mmol/L (ref 135–145)

## 2019-06-18 LAB — MAGNESIUM: Magnesium: 1.5 mg/dL — ABNORMAL LOW (ref 1.7–2.4)

## 2019-06-18 LAB — PHOSPHORUS: Phosphorus: 2.5 mg/dL (ref 2.5–4.6)

## 2019-06-18 NOTE — Progress Notes (Signed)
Patient has not urinated throughout night. MD notified. New orders placed for in and out cath. In and out cath performed, return 500cc of clear yellow urine.

## 2019-06-18 NOTE — Progress Notes (Signed)
Subjective:  Patient has no complaints.  He says his appetite is good.  He ate all of his breakfast.  He said his stool is normal she.  He thinks he had 2 stools yesterday and none today.  He denies nausea vomiting or abdominal pain.  He states he lives alone and does the usual household work including cooking.  His father died a few years ago.  He has 4 sons and one of them who lives in [facility] checks on him regularly.  Objective: Blood pressure (!) 150/72, pulse 78, temperature 98.6 F (37 C), temperature source Oral, resp. rate 18, height 6' 1" (1.854 m), weight 98.7 kg, SpO2 100 %. Patient is alert and responds appropriate to questions. Conjunctiva is pink. Sclera is nonicteric Oropharyngeal mucosa is normal. No neck masses or thyromegaly noted. Cardiac exam with regular rhythm normal S1 and S2. No murmur or gallop noted. Lungs are clear to auscultation. Abdomen is full.  Bowel sounds are hyperactive.  On palpation abdomen is soft and nontender with organomegaly or masses. Mild pitting edema to both feet right greater than left.  Labs/studies Results:  CBC Latest Ref Rng & Units 06/17/2019 06/15/2019 06/14/2019  WBC 4.0 - 10.5 K/uL 7.9 9.0 10.0  Hemoglobin 13.0 - 17.0 g/dL 7.8(L) 8.2(L) 8.5(L)  Hematocrit 39.0 - 52.0 % 24.2(L) 25.3(L) 25.7(L)  Platelets 150 - 400 K/uL 324 308 282    CMP Latest Ref Rng & Units 06/17/2019 06/15/2019 06/14/2019  Glucose 70 - 99 mg/dL 111(H) 118(H) 104(H)  BUN 8 - 23 mg/dL <5(L) 5(L) 5(L)  Creatinine 0.61 - 1.24 mg/dL 0.87 0.86 0.88  Sodium 135 - 145 mmol/L 139 139 140  Potassium 3.5 - 5.1 mmol/L 3.0(L) 4.3 2.6(LL)  Chloride 98 - 111 mmol/L 116(H) 120(H) 118(H)  CO2 22 - 32 mmol/L 17(L) 16(L) 16(L)  Calcium 8.9 - 10.3 mg/dL 7.2(L) 7.3(L) 7.2(L)  Total Protein 6.5 - 8.1 g/dL - 5.4(L) 5.2(L)  Total Bilirubin 0.3 - 1.2 mg/dL - 0.5 0.7  Alkaline Phos 38 - 126 U/L - 98 97  AST 15 - 41 U/L - 26 26  ALT 0 - 44 U/L - 14 15    Hepatic  Function Latest Ref Rng & Units 06/15/2019 06/14/2019 06/11/2019  Total Protein 6.5 - 8.1 g/dL 5.4(L) 5.2(L) -  Albumin 3.5 - 5.0 g/dL 2.0(L) 2.0(L) 2.0(L)  AST 15 - 41 U/L 26 26 -  ALT 0 - 44 U/L 14 15 -  Alk Phosphatase 38 - 126 U/L 98 97 -  Total Bilirubin 0.3 - 1.2 mg/dL 0.5 0.7 -  Bilirubin, Direct 0.0 - 0.2 mg/dL - - -     Assessment:  #1.  Diarrhea.  Patient has undergone extensive evaluation including stool testing for C. difficile pathogen panel as well as colonoscopy.  Colonic mucosa appeared to be abnormal according to Dr. Gala Romney but biopsy unremarkable suggesting changes may be due to mucosal edema resulting from hypoproteinemia.  Patient remains on p.o. vancomycin and Florastor.  He is to continue vancomycin for total of 10 days as recommended by Dr. Oneida Alar. I wonder if diarrhea is primarily due to alcohol induced small bowel injury and bowel edema due to hypoproteinemia.  #2.  Anemia.  No evidence of overt GI bleed.  Patient has received 2 units of PRBCs.  H&H remains low.  Iron studies are consistent with chronic disease anemia.  B12 and folate levels normal.  #3.  Lower extremity edema secondary to hypoammonemia.  Mild edema to both  feet right greater than left.  #4.  Alcoholic hepatitis.  AST mildly elevated on admission with normal ALT.  Transaminases are normal.  CT revealed pronounced fatty liver secondary to ASH.   Recommendations  We will check hepatitis B surface antigen and HCV antibody. CBC and serum albumin in a.m. Continue p.o. vancomycin as recommended by Dr. Oneida Alar.

## 2019-06-18 NOTE — Progress Notes (Signed)
PROGRESS NOTE    Jemarcus Dougal  GXQ:[id number] DOB: March 29, 1950 DOA: 06/04/2019 PCP: [name], MD    Brief Narrative:  Patient was admitted to the hospital with working diagnosis of right pyelonephritis  69 year old male who presented with leg edema.  He does have significant past medical history for alcohol abuse, chronic anemia, erosive gastropathy, hypertension, dyslipidemia, and type 2 diabetes mellitus.  He reported several weeks of lower extremity edema, associated with diarrhea, left lower quadrant abdominal pain.  Positive medical noncompliance.  On his initial physical examination temperature 98.8, heart rate 94, respiratory rate 19, blood pressure 104/74, his lungs were clear to auscultation bilaterally, heart S1-S2 present, tachycardic, abdomen distended, positive ascites, +3+ lower extremity edema.  CT of the abdomen with distended urinary bladder, mild bilateral hydronephrosis, with features of right pyelonephritis.  Positive perisigmoid stranding, suggesting colitis.  Further imaging CT of the abdomen 04/26, with worsening pancolitis, patient placed on oral vancomycin. 04/28 patient underwent flex sigmoidoscopy and biopsies were taken, resulted in negative for pathology.   Patient received 2 unit of packed red blood cells on admission for anemia.  Today patient with persistent urine retention, Foley catheter was placed.  Assessment & Plan:   Principal Problem:   Colitis, acute---??? infectious pancolitis Active Problems:   Hypertension   Normocytic anemia   DM2 (diabetes mellitus, type 2) (HCC)   Hypokalemia   Hypomagnesemia   Hypocalcemia   Alcohol abuse   Tobacco use   Pyelonephritis of right kidney   Pyelonephritis   Abnormal CT scan, colon   Diarrhea   Urinary retention   1. Moderate to severe transverse, descending and sigmoid colitis. Diarrhea clinically has been improving, patient is tolerating po with no nausea or vomiting. Stool for c dif negative,  GI panel negative.   Will continue with oral vancomycin for now.   2. Urinary retention. Patient with persistent urinary retention, multiple in and out catheterizations. Foley catheter has been placed.   Follow up imaging with improved hydronephrosis. Continue with tamsulosin, will need follow up as outpatient with urology.   3. Hypokalemia, hypocalcemia, hypomagnesemia. Will check renal panel today and continue aggressive electrolyte correction, keep K at 4,0 and Mg at 2,0. Will check on P, possible refeeding syndrome.   4. Chronic multifactorial anemia. Continue close monitoring of Hgb and Hct.   5. HTN. Continue blood pressure control with amlodipine and carvedilol.   6. Alcohol abuse. Will continue thiamine and folic acid, continue nutritional supplementation. Patient with no active withdrawal symptoms.      DVT prophylaxis:  enoxaparin   Code Status:    full  Family Communication:  I spoke with patient's son at the bedside, we talked in detail about patient's condition, plan of care and prognosis and all questions were addressed.   Disposition Plan:   Patient is from:  Home   Anticipated DC to:  Home   Anticipated DC date:  05/03  Anticipated DC barriers: Patient with improving diarrhea but continue to have persistent electrolyte abnormalities.      Consultants:   GI   ID   Procedures:   Flex with biopsy  Antimicrobials:   Oral vancomycin     Subjective: Patient is feeling better, his diarrhea has improved along with his appetite, no nausea or vomiting, no chest pain or dyspnea. Continue to have urinary retention.   Objective: Vitals:   06/17/19 2037 06/18/19 0457 06/18/19 0933 06/18/19 1436  BP: (!) 159/72 (!) 158/71 (!) 150/72 137/68  Pulse: 77 78  77  Resp: 18 18  17   Temp: 98.1 F (36.7 C) 98.6 F (37 C)    TempSrc: Oral Oral    SpO2: 100% 100%  100%  Weight:      Height:        Intake/Output Summary (Last 24 hours) at 06/18/2019 1556 Last data  filed at 06/18/2019 0600 Gross per 24 hour  Intake 240 ml  Output 1100 ml  Net -860 ml   Filed Weights   06/06/19 0500 06/07/19 0455 06/08/19 0500  Weight: 97.8 kg 98.3 kg 98.7 kg    Examination:   General: Not in pain or dyspnea, deconditioned  Neurology: Awake and alert, non focal  E ENT: mild pallor, no icterus, oral mucosa moist Cardiovascular: No JVD. S1-S2 present, rhythmic, no gallops, rubs, or murmurs. Trace lower extremity edema. Pulmonary: vesicular breath sounds bilaterally, adequate air movement, no wheezing, rhonchi or rales. Gastrointestinal. Abdomen with no organomegaly, non tender, no rebound or guarding Skin. No rashes Musculoskeletal: no joint deformities     Data Reviewed: I have personally reviewed following labs and imaging studies  CBC: Recent Labs  Lab 06/13/19 0518 06/14/19 0616 06/15/19 0424 06/17/19 0505  WBC 10.9* 10.0 9.0 7.9  HGB 8.2* 8.5* 8.2* 7.8*  HCT 25.3* 25.7* 25.3* 24.2*  MCV 90.0 89.2 90.7 91.3  PLT 293 282 308 628   Basic Metabolic Panel: Recent Labs  Lab 06/12/19 0640 06/13/19 0518 06/14/19 0616 06/15/19 0424 06/17/19 0505  NA 143 142 140 139 139  K 2.3* 2.4* 2.6* 4.3 3.0*  CL 119* 118* 118* 120* 116*  CO2 17* 17* 16* 16* 17*  GLUCOSE 96 96 104* 118* 111*  BUN 7* 6* 5* 5* <5*  CREATININE 1.04 0.98 0.88 0.86 0.87  CALCIUM 7.1* 7.2* 7.2* 7.3* 7.2*  MG 1.6* 1.9 1.7  --   --    GFR: Estimated Creatinine Clearance: 103.3 mL/min (by C-G formula based on SCr of 0.87 mg/dL). Liver Function Tests: Recent Labs  Lab 06/14/19 0616 06/15/19 0424  AST 26 26  ALT 15 14  ALKPHOS 97 98  BILITOT 0.7 0.5  PROT 5.2* 5.4*  ALBUMIN 2.0* 2.0*   No results for input(s): LIPASE, AMYLASE in the last 168 hours. No results for input(s): AMMONIA in the last 168 hours. Coagulation Profile: No results for input(s): INR, PROTIME in the last 168 hours. Cardiac Enzymes: No results for input(s): CKTOTAL, CKMB, CKMBINDEX, TROPONINI in the  last 168 hours. BNP (last 3 results) No results for input(s): PROBNP in the last 8760 hours. HbA1C: No results for input(s): HGBA1C in the last 72 hours. CBG: Recent Labs  Lab 06/17/19 1158 06/17/19 1631 06/17/19 2038 06/18/19 0536 06/18/19 1116  GLUCAP 131* 107* 118* 107* 112*   Lipid Profile: No results for input(s): CHOL, HDL, LDLCALC, TRIG, CHOLHDL, LDLDIRECT in the last 72 hours. Thyroid Function Tests: No results for input(s): TSH, T4TOTAL, FREET4, T3FREE, THYROIDAB in the last 72 hours. Anemia Panel: No results for input(s): VITAMINB12, FOLATE, FERRITIN, TIBC, IRON, RETICCTPCT in the last 72 hours.    Radiology Studies: I have reviewed all of the imaging during this hospital visit personally     Scheduled Meds: . amLODipine  5 mg Oral Daily  . carvedilol  12.5 mg Oral BID  . Chlorhexidine Gluconate Cloth  6 each Topical Daily  . folic acid  1 mg Oral Daily  . Gerhardt's butt cream   Topical BID  . hydrocortisone   Rectal QID  . multivitamin with minerals  1 tablet Oral Daily  . pantoprazole  40 mg Oral Daily  . potassium chloride  40 mEq Oral Daily  . saccharomyces boulardii  250 mg Oral BID  . tamsulosin  0.4 mg Oral BID  . thiamine  100 mg Oral Daily   Or  . thiamine  100 mg Intravenous Daily  . vancomycin  125 mg Oral TID WC & HS   Continuous Infusions:   LOS: 13 days        [facility] Gerome Apley, MD

## 2019-06-19 LAB — GLUCOSE, CAPILLARY
Glucose-Capillary: 92 mg/dL (ref 70–99)
Glucose-Capillary: 103 mg/dL — ABNORMAL HIGH (ref 70–99)
Glucose-Capillary: 112 mg/dL — ABNORMAL HIGH (ref 70–99)
Glucose-Capillary: 102 mg/dL — ABNORMAL HIGH (ref 70–99)

## 2019-06-19 LAB — ALBUMIN: Albumin: 2.1 g/dL — ABNORMAL LOW (ref 3.5–5.0)

## 2019-06-19 LAB — BASIC METABOLIC PANEL
Anion gap: 6 (ref 5–15)
BUN: <5 mg/dL — ABNORMAL LOW (ref 8–23)
CO2: 18 mmol/L — ABNORMAL LOW (ref 22–32)
Calcium: 7.3 mg/dL — ABNORMAL LOW (ref 8.9–10.3)
Chloride: 119 mmol/L — ABNORMAL HIGH (ref 98–111)
Creatinine, Ser: 0.79 mg/dL (ref 0.61–1.24)
GFR calc Af Amer: >60 mL/min (ref >60)
GFR calc non Af Amer: >60 mL/min (ref >60)
Glucose, Bld: 104 mg/dL — ABNORMAL HIGH (ref 70–99)
Potassium: 3.1 mmol/L — ABNORMAL LOW (ref 3.5–5.1)
Sodium: 143 mmol/L (ref 135–145)

## 2019-06-19 LAB — CBC
HCT: 24.3 % — ABNORMAL LOW (ref 39.0–52.0)
Hemoglobin: 7.6 g/dL — ABNORMAL LOW (ref 13.0–17.0)
MCH: 28.7 pg (ref 26.0–34.0)
MCHC: 31.3 g/dL (ref 30.0–36.0)
MCV: 91.7 fL (ref 80.0–100.0)
Platelets: 352 10*3/uL (ref 150–400)
RBC: 2.65 MIL/uL — ABNORMAL LOW (ref 4.22–5.81)
RDW: 17.1 % — ABNORMAL HIGH (ref 11.5–15.5)
WBC: 7.1 10*3/uL (ref 4.0–10.5)
nRBC: 0 % (ref 0.0–0.2)

## 2019-06-19 LAB — HEPATITIS C ANTIBODY: HCV Ab: NONREACTIVE

## 2019-06-19 LAB — HEPATITIS B SURFACE ANTIGEN: Hepatitis B Surface Ag: NONREACTIVE

## 2019-06-19 MED ORDER — MAGNESIUM OXIDE 400 (241.3 MG) MG PO TABS
400.0000 mg | ORAL_TABLET | Freq: Every day | ORAL | Status: DC
  Administered 2019-06-19 – 2019-06-20 (×2): 400 mg via ORAL
  Filled 2019-06-19 (×2): qty 1

## 2019-06-19 NOTE — Progress Notes (Signed)
Subjective:  Patient has no complaints.  He states he has very good appetite.  He is eating 100% of his meals.  He denies nausea vomiting or abdominal pain.  He believes he had 1 stool today which he describes to be mushy/loose.  He denies melena or rectal bleeding.  He says he is passing a lot of flatus.  He was having voiding difficulty and Foley's catheter was placed yesterday.  Objective: Blood pressure (!) 143/61, pulse 65, temperature 98.9 F (37.2 C), temperature source Oral, resp. rate 18, height 6' 1"  (1.854 m), weight 98.7 kg, SpO2 100 %. Patient is alert and in no acute distress.   He is calm and does not have tremors. Abdomen is full.  Bowel sounds are hyperactive.  On palpation abdomen is soft and nontender with organomegaly or masses.  Percussion note is tympanitic.  Current Facility-Administered Medications:  .  amLODipine (NORVASC) tablet 5 mg, 5 mg, Oral, Daily, Rourk, Cristopher Estimable, MD, 5 mg at 06/19/19 0806 .  carvedilol (COREG) tablet 12.5 mg, 12.5 mg, Oral, BID, Rourk, Cristopher Estimable, MD, 12.5 mg at 06/19/19 0805 .  Chlorhexidine Gluconate Cloth 2 % PADS 6 each, 6 each, Topical, Daily, Rourk, Cristopher Estimable, MD, 6 each at 06/19/19 [phone]  folic acid (FOLVITE) tablet 1 mg, 1 mg, Oral, Daily, Rourk, Cristopher Estimable, MD, 1 mg at 06/19/19 0806 .  Gerhardt's butt cream, , Topical, BID, Rourk, Cristopher Estimable, MD, Given at 06/19/19 [phone]  hydrocortisone (ANUSOL-HC) 2.5 % rectal cream, , Rectal, QID, Rourk, Cristopher Estimable, MD, Given at 06/19/19 [phone] .  magnesium oxide (MAG-OX) tablet 400 mg, 400 mg, Oral, Daily, Barton Dubois, MD, 400 mg at 06/19/19 1012 .  multivitamin with minerals tablet 1 tablet, 1 tablet, Oral, Daily, Rourk, Cristopher Estimable, MD, 1 tablet at 06/19/19 [phone] .  pantoprazole (PROTONIX) EC tablet 40 mg, 40 mg, Oral, Daily, Rourk, Cristopher Estimable, MD, 40 mg at 06/19/19 0807 .  potassium chloride SA (KLOR-CON) CR tablet 40 mEq, 40 mEq, Oral, Daily, Barton Dubois, MD, 40 mEq at 06/19/19 0805 .  saccharomyces  boulardii (FLORASTOR) capsule 250 mg, 250 mg, Oral, BID, Barton Dubois, MD, 250 mg at 06/19/19 0805 .  tamsulosin (FLOMAX) capsule 0.4 mg, 0.4 mg, Oral, BID, Rourk, Cristopher Estimable, MD, 0.4 mg at 06/19/19 0805 .  thiamine tablet 100 mg, 100 mg, Oral, Daily, 100 mg at 06/19/19 0806 **OR** thiamine (B-1) injection 100 mg, 100 mg, Intravenous, Daily, Rourk, Cristopher Estimable, MD .  traMADol Veatrice Bourbon) tablet 50 mg, 50 mg, Oral, Q12H PRN, Daneil Dolin, MD, 50 mg at 06/14/19 2123 .  vancomycin (VANCOCIN) 50 mg/mL oral solution 125 mg, 125 mg, Oral, TID WC & HS, Rourk, Cristopher Estimable, MD, 125 mg at 06/19/19 1233   Labs/studies Results:   CBC Latest Ref Rng & Units 06/19/2019 06/17/2019 06/15/2019  WBC 4.0 - 10.5 K/uL 7.1 7.9 9.0  Hemoglobin 13.0 - 17.0 g/dL 7.6(L) 7.8(L) 8.2(L)  Hematocrit 39.0 - 52.0 % 24.3(L) 24.2(L) 25.3(L)  Platelets 150 - 400 K/uL 352 324 308    CMP Latest Ref Rng & Units 06/19/2019 06/18/2019 06/17/2019  Glucose 70 - 99 mg/dL 104(H) 113(H) 111(H)  BUN 8 - 23 mg/dL <5(L) <5(L) <5(L)  Creatinine 0.61 - 1.24 mg/dL 0.79 0.84 0.87  Sodium 135 - 145 mmol/L 143 142 139  Potassium 3.5 - 5.1 mmol/L 3.1(L) 3.4(L) 3.0(L)  Chloride 98 - 111 mmol/L 119(H) 117(H) 116(H)  CO2 22 - 32 mmol/L 18(L) 18(L) 17(L)  Calcium 8.9 -  10.3 mg/dL 7.3(L) 7.4(L) 7.2(L)  Total Protein 6.5 - 8.1 g/dL - - -  Total Bilirubin 0.3 - 1.2 mg/dL - - -  Alkaline Phos 38 - 126 U/L - - -  AST 15 - 41 U/L - - -  ALT 0 - 44 U/L - - -    Hepatic Function Latest Ref Rng & Units 06/19/2019 06/15/2019 06/14/2019  Total Protein 6.5 - 8.1 g/dL - 5.4(L) 5.2(L)  Albumin 3.5 - 5.0 g/dL 2.1(L) 2.0(L) 2.0(L)  AST 15 - 41 U/L - 26 26  ALT 0 - 44 U/L - 14 15  Alk Phosphatase 38 - 126 U/L - 98 97  Total Bilirubin 0.3 - 1.2 mg/dL - 0.5 0.7  Bilirubin, Direct 0.0 - 0.2 mg/dL - - -    Hepatitis B surface antigen is negative Hepatitis C virus antibody negative.  Assessment:  #1.  Diarrhea.  Patient remains with diarrhea although stool frequency has  decreased.  He was found to have colitis based on CT but no endoscopic colitis on colonoscopy and biopsies negative as well.  Stool studies were negative including C. difficile testing on 2 different occasions.  He is on vancomycin with clinical diagnosis of infectious colitis.  I wonder if diarrhea is primarily due to malabsorption and CT abnormality as result of profound hypoalbuminemia.  #2.  Anemia.  Hemoglobin remains low.  Iron studies are consistent with anemia of chronic disease.  B12 and folate levels are normal.  If he has an element of ethanol-induced marrow toxicity that should improve with time.  #3.  Hypoalbuminemia.  Although oral intake appears to be satisfactory but his albumin is not coming up.  Will repeat in 3 days.  #4.  Alcoholic hepatitis.  AST mildly elevated on admission with normal ALT.  Transaminases are normal.  CT revealed pronounced fatty liver secondary to ASH.  Viral markers for hepatitis B and C are negative.  #5.  Hypokalemia.  Patient is receiving oral KCl.   Recommendations  We will continue to monitor H&H and serum albumin. Obtain stool specimen for qualitative fecal fat.

## 2019-06-19 NOTE — Progress Notes (Signed)
Patient Demographics:    Darren Howard, is a 69 y.o. male, DOB - 16-Oct-1950, GNF:[id number]  Admit date - 06/04/2019   Admitting Physician No admitting provider for patient encounter.  Outpatient Primary MD for the patient is [name], MD  LOS - 26  Chief Complaint  Patient presents with  . Leg Swelling       Subjective:    [name] reports good appetite, no chest pain, no nausea, no vomiting, no abdominal pain.  One loose stool overnight.  Tolerating diet 100%. Magnesium is low, has required replacement of foley catheter. GI checking qualitative stool and fat content.     Assessment  & Plan :    Principal Problem:   Colitis, acute---??? infectious pancolitis Active Problems:   Hypertension   Normocytic anemia   DM2 (diabetes mellitus, type 2) (HCC)   Hypokalemia   Hypomagnesemia   Hypocalcemia   Alcohol abuse   Tobacco use   Pyelonephritis of right kidney   Pyelonephritis   Abnormal CT scan, colon   Diarrhea   Urinary retention  Brief Summary:- 69 y.o. male with medical history significant of alcohol abuse, chronic normocytic anemia, medication and treatment noncompliance, history of duodenal reduction, erosive gastropathy, GI bleed, glucose intolerance/type 2 diabetes, hyperlipidemia, hypertension admitted on 06/05/2019 with possible right-sided pyelonephritis, as well as stercoral colitis and found to have alcohol withdrawal symptoms  A/p 1)Pan- Colitis--- patient previously received iv Vancomycin, Rocephin and Cefepime , also treated with p.o. Augmentin and p.o. Flagyl during this admission -Currently on oral vancomycin per GI started on 06/13/2018 -Continues to have numerous/frequent greenish mucousy stool---almost jelly appearing stools--- -CT abdomen and pelvis from 06/13/2019 reviewed with radiologist Dr. Thornton Papas, compared to CT from 06/04/2019 --patient now has moderate to  severe colitis ranging from the proximal ascending colon, involving the transverse colon and extending into the descending, sigmoid and colon as well as the rectum (colonic wall thickening is a lot more pronounced) -Discussed with infectious disease specialist Dr. [name] on 06/14/2019---okay to continue oral vanco ordered by GI service, infectious disease recommended colonoscopy/flex sig procedure with biopsy for diagnostic purposes.   -s/p flex on 06/15/19; biopsy results pending -continue oral vanc for now as recommended by GI service. -Continue Anusol AC for hemorrhoids  -WBC is around 9; patient afebrile and so far C. difficile by PCR and GI pathogen negative.  2)Severe--- persistent electrolyte abnormalities including- hypocalcemia/hypokalemia/hypomagnesemia and hypoalbuminemia- --due to frequent BMs/GI losses and alcohol use ---replacing electrolytes iv and orally -Potassium stable on daily maintenance supplementation -Magnesium is low; will start maintenance tx as well.  -Telemetry discontinued on 06/16/2019.  3)Acute symptomatic blood loss anemia superimposed on chronic blood loss anemia ----admission hemoglobin was  6.5 , initially received 1 unit of blood on 06/06/2019, and another unit on 06/10/2019 -Hemoglobin up to 8.5 after transfusion. Currently 8.2 -No further signs of ongoing bleeding appreciated. -Continue to follow hemoglobin trend.  -Diagnostic flexible sigmoidoscopy done on 4/28; no active bleeding appreciated.  4)DM2--last A1c 6.9 reflecting excellent DM control PTA -Continue to hold oral hypoglycemic agents. -Continue sliding scale insulin. -Follow CBGs and nodules management while inpatient.   5)- Urinary retention-- -Unable to void after removal of Foley catheter --Patient failed voiding trial on 06/09/2019 -Second voiding trial  failed -will discharge with foley in place and outpatient follow up with urology service.  -Continue Flomax. -Denies  dysuria.  6)HTN--stable, and well-controlled currently -Continue current dose of carvedilol and daily amlodipine. -Plan is to resume heart healthy diet when properly able to tolerate by mouth.  7)Alcohol Abuse with DTs--- DT  resolved,  --continue folic acid, thiamine/multivitamin  -Patient is coherent and able to follow commands appropriately. -Cessation counseling has been provided.  Disposition:  -Patient From: home and anticipate discharge home with [facility] services once medically stable.   D/C Place: home   Barriers: Appears to be clinically stable currently; will further replete electrolytes today, arrange for home health services and neurology follow-up as an outpatient as he is going to be discharged with Foley catheter in place.  Per GI recommendations will complete a total of 10 days of oral vancomycin.   Code Status : full  Family Communication:   -Discussed with son [name] on 06/13/19 at bedside  Consults  : GI and phone consult with ID physician Dr. Drucilla Schmidt - Procedure:- -Diagnostic flex sig with biopsy planned for 06/15/2019  DVT Prophylaxis  :   - SCDs   Lab Results  Component Value Date   PLT 352 06/19/2019   Inpatient Medications  Scheduled Meds: . amLODipine  5 mg Oral Daily  . carvedilol  12.5 mg Oral BID  . Chlorhexidine Gluconate Cloth  6 each Topical Daily  . folic acid  1 mg Oral Daily  . Gerhardt's butt cream   Topical BID  . hydrocortisone   Rectal QID  . magnesium oxide  400 mg Oral Daily  . multivitamin with minerals  1 tablet Oral Daily  . pantoprazole  40 mg Oral Daily  . potassium chloride  40 mEq Oral Daily  . saccharomyces boulardii  250 mg Oral BID  . tamsulosin  0.4 mg Oral BID  . thiamine  100 mg Oral Daily   Or  . thiamine  100 mg Intravenous Daily  . vancomycin  125 mg Oral TID WC & HS   Continuous Infusions:  PRN Meds:.traMADol  Anti-infectives (From admission, onward)   Start     Dose/Rate Route Frequency Ordered Stop    06/13/19 1515  vancomycin (VANCOCIN) 50 mg/mL oral solution 125 mg     125 mg Oral 3 times daily with meals & bedtime 06/13/19 1508     06/09/19 1800  metroNIDAZOLE (FLAGYL) tablet 500 mg     500 mg Oral 3 times daily 06/09/19 1745 06/12/19 2112   06/07/19 2130  rifaximin (XIFAXAN) tablet 550 mg  Status:  Discontinued     550 mg Oral 2 times daily 06/07/19 2119 06/13/19 1502   06/06/19 2200  amoxicillin-clavulanate (AUGMENTIN) 875-125 MG per tablet 1 tablet     1 tablet Oral Every 12 hours 06/06/19 1822 06/12/19 2112   06/05/19 0645  metroNIDAZOLE (FLAGYL) IVPB 500 mg     500 mg 100 mL/hr over 60 Minutes Intravenous  Once 06/05/19 0640 06/05/19 0954   06/05/19 0600  ceFEPIme (MAXIPIME) 2 g in sodium chloride 0.9 % 100 mL IVPB  Status:  Discontinued     2 g 200 mL/hr over 30 Minutes Intravenous Every 8 hours 06/05/19 0217 06/05/19 0357   06/05/19 0400  cefTRIAXone (ROCEPHIN) 1 g in sodium chloride 0.9 % 100 mL IVPB  Status:  Discontinued     1 g 200 mL/hr over 30 Minutes Intravenous Every 24 hours 06/05/19 0357 06/06/19 1822   06/04/19 2130  vancomycin (  VANCOREADY) IVPB 2000 mg/400 mL     2,000 mg 200 mL/hr over 120 Minutes Intravenous  Once 06/04/19 2130 06/05/19 0216   06/04/19 2115  ceFEPIme (MAXIPIME) 2 g in sodium chloride 0.9 % 100 mL IVPB     2 g 200 mL/hr over 30 Minutes Intravenous  Once 06/04/19 2105 06/04/19 2226   06/04/19 2115  metroNIDAZOLE (FLAGYL) IVPB 500 mg     500 mg 100 mL/hr over 60 Minutes Intravenous  Once 06/04/19 2105 06/05/19 0028   06/04/19 2115  vancomycin (VANCOCIN) IVPB 1000 mg/200 mL premix  Status:  Discontinued     1,000 mg 200 mL/hr over 60 Minutes Intravenous  Once 06/04/19 2105 06/04/19 2130        Objective:   Vitals:   06/18/19 2110 06/19/19 0547 06/19/19 0805 06/19/19 1344  BP: (!) 182/88 (!) 156/74 (!) 167/81 (!) 143/61  Pulse: 74 72  65  Resp: 16 18  18   Temp: 98.7 F (37.1 C) 99.3 F (37.4 C)  98.9 F (37.2 C)  TempSrc:  Oral  Oral   SpO2: 100% 100%  100%  Weight:      Height:        Wt Readings from Last 3 Encounters:  06/08/19 98.7 kg  05/31/18 100 kg  05/17/18 93.5 kg    Intake/Output Summary (Last 24 hours) at 06/19/2019 1539 Last data filed at 06/18/2019 2232 Gross per 24 hour  Intake 240 ml  Output 501 ml  Net -261 ml   Physical Exam General exam: Alert, awake, oriented x 3; feeling better and reporting good appetite.  No nausea or vomiting.  Reports having 1 mushy/loose bowel movement overnight.  No melena or overt bleeding.  Patient has failed voiding trials and require replacement of Foley catheter. Respiratory system: Clear to auscultation. Respiratory effort normal. Cardiovascular system:RRR. No murmurs, rubs, gallops. Gastrointestinal system: Abdomen is nondistended, soft and nontender. No organomegaly or masses felt. Normal bowel sounds heard. Central nervous system: Alert and oriented. No focal neurological deficits. Extremities: No C/C/E, +pedal pulses Skin: No rashes, lesions or ulcers Psychiatry: Judgement and insight appear normal. Mood & affect appropriate.      Data Review:   Micro Results Recent Results (from the past 240 hour(s))  Gastrointestinal Panel by PCR , Stool     Status: None   Collection Time: 06/13/19  3:35 PM   Specimen: Stool  Result Value Ref Range Status   Campylobacter species NOT DETECTED NOT DETECTED Final   Plesimonas shigelloides NOT DETECTED NOT DETECTED Final   Salmonella species NOT DETECTED NOT DETECTED Final   Yersinia enterocolitica NOT DETECTED NOT DETECTED Final   Vibrio species NOT DETECTED NOT DETECTED Final   Vibrio cholerae NOT DETECTED NOT DETECTED Final   Enteroaggregative E coli (EAEC) NOT DETECTED NOT DETECTED Final   Enteropathogenic E coli (EPEC) NOT DETECTED NOT DETECTED Final   Enterotoxigenic E coli (ETEC) NOT DETECTED NOT DETECTED Final   Shiga like toxin producing E coli (STEC) NOT DETECTED NOT DETECTED Final   Shigella/Enteroinvasive E  coli (EIEC) NOT DETECTED NOT DETECTED Final   Cryptosporidium NOT DETECTED NOT DETECTED Final   Cyclospora cayetanensis NOT DETECTED NOT DETECTED Final   Entamoeba histolytica NOT DETECTED NOT DETECTED Final   Giardia lamblia NOT DETECTED NOT DETECTED Final   Adenovirus F40/41 NOT DETECTED NOT DETECTED Final   Astrovirus NOT DETECTED NOT DETECTED Final   Norovirus GI/GII NOT DETECTED NOT DETECTED Final   Rotavirus A NOT DETECTED NOT DETECTED Final  Sapovirus (I, II, IV, and V) NOT DETECTED NOT DETECTED Final    Comment: Performed at [facility], [place], [place] [address]  C Difficile Quick Screen w PCR reflex     Status: None   Collection Time: 06/13/19  3:35 PM  Result Value Ref Range Status   C Diff antigen NEGATIVE NEGATIVE Final   C Diff toxin NEGATIVE NEGATIVE Final   C Diff interpretation No C. difficile detected.  Final    Comment: Performed at [facility], [address] [place], [place] [address]    Radiology Reports CT ABDOMEN PELVIS W CONTRAST  Result Date: 06/13/2019 CLINICAL DATA:  Diarrhea. EXAM: CT ABDOMEN AND PELVIS WITH CONTRAST TECHNIQUE: Multidetector CT imaging of the abdomen and pelvis was performed using the standard protocol following bolus administration of intravenous contrast. CONTRAST:  165mL OMNIPAQUE IOHEXOL 300 MG/ML  SOLN COMPARISON:  June 04, 2019. FINDINGS: Lower chest: Mild bilateral pleural effusions are noted with adjacent subsegmental atelectasis. Hepatobiliary: Hepatic steatosis is noted. No gallstones are noted. No biliary dilatation is noted. Pancreas: Unremarkable. No pancreatic ductal dilatation or surrounding inflammatory changes. Spleen: Normal in size without focal abnormality. Adrenals/Urinary Tract: Adrenal glands appear normal. Right renal cysts are noted. No renal or ureteral calculi are noted. Minimal left hydroureteronephrosis is noted without obstructing calculus. Urinary bladder is decompressed secondary to  Foley catheter. Stomach/Bowel: The stomach appears normal. The appendix is not visualized. Moderate to severe wall thickening is seen involving the transverse, descending and sigmoid colon and rectum concerning for inflammatory or infectious colitis. There is no evidence of bowel obstruction. Vascular/Lymphatic: No significant vascular findings are present. No enlarged abdominal or pelvic lymph nodes. Reproductive: Prostate is unremarkable. Other: No abdominal wall hernia or abnormality. No abdominopelvic ascites. Musculoskeletal: No acute or significant osseous findings. IMPRESSION: 1. Hepatic steatosis. 2. Mild bilateral pleural effusions are noted with adjacent subsegmental atelectasis. 3. Minimal left hydroureteronephrosis is noted without obstructing calculus. 4. Moderate to severe wall thickening is seen involving the transverse, descending and sigmoid colon and rectum concerning for inflammatory or infectious colitis. Electronically Signed   By: Marijo Conception M.D.   On: 06/13/2019 12:44   CT Abdomen Pelvis W Contrast  Result Date: 06/04/2019 CLINICAL DATA:  69 year old male with abdominal distension and diarrhea. Concern for acute diverticulitis. EXAM: CT ABDOMEN AND PELVIS WITH CONTRAST TECHNIQUE: Multidetector CT imaging of the abdomen and pelvis was performed using the standard protocol following bolus administration of intravenous contrast. CONTRAST:  138mL OMNIPAQUE IOHEXOL 300 MG/ML  SOLN COMPARISON:  None. FINDINGS: Lower chest: The visualized lung bases are clear. No intra-abdominal free air or free fluid. Hepatobiliary: Severe fatty infiltration of the liver. No intrahepatic biliary ductal dilatation. The gallbladder is unremarkable. Pancreas: Atrophic pancreas. No Accu to inflammatory changes. No dilatation of the main pancreatic duct. Spleen: Normal in size without focal abnormality. Adrenals/Urinary Tract: The adrenal glands are unremarkable. There is mild bilateral hydronephrosis, right  greater left. There is enhancement of the right renal urothelium concerning for pyelonephritis. Correlation with urinalysis recommended. There is slight delayed enhancement of the right renal parenchyma. Multiple right renal cysts measure up to 4 cm in the interpolar aspect of the right kidney. Additional subcentimeter right renal hypodense lesions are too small to characterize. The urinary bladder is distended. Stomach/Bowel: There is narrowing of a segment of the rectosigmoid, likely related to mass effect caused by distended urinary bladder. There is mild thickened appearance of the segment of colon with surrounding stranding. The perisigmoid  stranding may be related to UTI but concerning for stercoral colitis. Clinical correlation is recommended. There is probable mild associate luminal narrowing of the sigmoid colon. No evidence of small-bowel obstruction. The appendix is not visualized with certainty. No inflammatory changes identified in the right lower quadrant. Vascular/Lymphatic: The abdominal aorta and IVC unremarkable. No portal venous gas. There is no adenopathy. Reproductive: The prostate and seminal vesicles are grossly unremarkable. Partially visualized fluid within the left inguinal canal likely related to hydrocele. Other: Mild diffuse subcutaneous edema. Musculoskeletal: Degenerative changes of the spine. No acute osseous pathology. IMPRESSION: 1. Distended urinary bladder with mild bilateral hydronephrosis and findings of right-sided pyelonephritis. Correlation with urinalysis recommended. 2. Thickened appearance of the rectosigmoid with surrounding stranding which may be related to UTI but concerning for stercoral colitis. Clinical correlation is recommended. 3. Severe fatty liver. Electronically Signed   By: Anner Crete M.D.   On: 06/04/2019 23:41   US Venous Img Lower Bilateral (DVT)  Result Date: 06/05/2019 CLINICAL DATA:  Bilateral lower extremity pain and edema for 1 month. EXAM:  BILATERAL LOWER EXTREMITY VENOUS DOPPLER ULTRASOUND TECHNIQUE: Gray-scale sonography with compression, as well as color and duplex ultrasound, were performed to evaluate the deep venous system(s) from the level of the common femoral vein through the popliteal and proximal calf veins. COMPARISON:  None. FINDINGS: VENOUS Normal compressibility of the common femoral, superficial femoral, and popliteal veins, as well as the visualized calf veins. Visualized portions of profunda femoral vein and great saphenous vein unremarkable. No filling defects to suggest DVT on grayscale or color Doppler imaging. Doppler waveforms show normal direction of venous flow, normal respiratory phasicity and response to augmentation. OTHER None. Limitations: none IMPRESSION: No femoropopliteal DVT nor evidence of DVT within the visualized calf veins. If clinical symptoms are inconsistent or if there are persistent or worsening symptoms, further imaging (possibly involving the iliac veins) may be warranted. Electronically Signed   By: Abigail Miyamoto M.D.   On: 06/05/2019 11:44   DG Chest Port 1 View  Result Date: 06/04/2019 CLINICAL DATA:  Tachycardia, bilateral lower extremity edema EXAM: PORTABLE CHEST 1 VIEW COMPARISON:  05/13/2018 FINDINGS: The heart size and mediastinal contours are within normal limits. Both lungs are clear. The visualized skeletal structures are unremarkable. IMPRESSION: No active disease. Electronically Signed   By: Randa Ngo M.D.   On: 06/04/2019 20:32   DG Abd 2 Views  Result Date: 06/11/2019 CLINICAL DATA:  Abdominal pain. EXAM: ABDOMEN - 2 VIEW COMPARISON:  None. FINDINGS: No free air, portal venous gas, or pneumatosis. Numerous air-filled mildly prominent but nondilated loops of small bowel. No gas in the region of the rectum. No other acute abnormalities. IMPRESSION: 1. Numerous mildly prominent loops of small bowel without significant dilatation. Developing ileus favored. Very early small bowel  obstruction not definitely excluded despite the lack of definitive dilatation as no definitive colonic gas is noted. Recommend clinical correlation and attention on follow-up. Electronically Signed   By: Dorise Bullion III M.D   On: 06/11/2019 13:29   ECHOCARDIOGRAM COMPLETE  Result Date: 06/05/2019    ECHOCARDIOGRAM REPORT   Patient Name:   MUNEEB VERAS Date of Exam: 06/05/2019 Medical Rec #:  [id number]     Height:       73.0 in Accession #:    [id number]    Weight:       205.0 lb Date of Birth:  Jan 05, 1951    BSA:          2.174 m  Patient Age:    29 years      BP:           152/86 mmHg Patient Gender: M             HR:           90 bpm. Exam Location:  [facility] Procedure: 2D Echo, Cardiac Doppler and Color Doppler Indications:    Lower extremity edema [218969]                 Alcohol abuse [361443]  History:        Patient has no prior history of Echocardiogram examinations.                 Risk Factors:Hypertension, Diabetes, Current Smoker and                 Dyslipidemia.  Sonographer:    Alvino Chapel RCS Referring Phys: [place]  1. Left ventricular ejection fraction, by estimation, is 60 to 65%. The left ventricle has normal function. The left ventricle has no regional wall motion abnormalities. There is moderate left ventricular hypertrophy. Left ventricular diastolic parameters were normal.  2. Right ventricular systolic function is normal. The right ventricular size is normal.  3. Left atrial size was mildly dilated.  4. The mitral valve is normal in structure. No evidence of mitral valve regurgitation.  5. The aortic valve is tricuspid. Aortic valve regurgitation is not visualized. No aortic stenosis is present. FINDINGS  Left Ventricle: Left ventricular ejection fraction, by estimation, is 60 to 65%. The left ventricle has normal function. The left ventricle has no regional wall motion abnormalities. The left ventricular internal cavity size was normal in size. There is   moderate left ventricular hypertrophy. Left ventricular diastolic parameters were normal. Right Ventricle: The right ventricular size is normal. Right vetricular wall thickness was not assessed. Right ventricular systolic function is normal. There is mildly elevated pulmonary artery systolic pressure. The tricuspid regurgitant velocity is 2.55 m/s, and with an assumed right atrial pressure of 8 mmHg, the estimated right ventricular systolic pressure is 15.4 mmHg. Left Atrium: Left atrial size was mildly dilated. Right Atrium: Right atrial size was normal in size. Pericardium: Trivial pericardial effusion is present. Presence of pericardial fat pad. Mitral Valve: The mitral valve is normal in structure. No evidence of mitral valve regurgitation. Tricuspid Valve: The tricuspid valve is normal in structure. Tricuspid valve regurgitation is trivial. Aortic Valve: The aortic valve is tricuspid. Aortic valve regurgitation is not visualized. No aortic stenosis is present. Pulmonic Valve: The pulmonic valve was not well visualized. Pulmonic valve regurgitation is not visualized. Aorta: The aortic root is normal in size and structure. IAS/Shunts: The interatrial septum was not well visualized.  LEFT VENTRICLE PLAX 2D LVIDd:         4.85 cm  Diastology LVIDs:         3.13 cm  LV e' lateral:   10.80 cm/s LV PW:         1.35 cm  LV E/e' lateral: 9.4 LV IVS:        1.41 cm  LV e' medial:    9.03 cm/s LVOT diam:     2.00 cm  LV E/e' medial:  11.2 LV SV:         76 LV SV Index:   35 LVOT Area:     3.14 cm  RIGHT VENTRICLE RV Mid diam:    3.04 cm RV S prime:  19.60 cm/s TAPSE (M-mode): 2.1 cm LEFT ATRIUM             Index LA diam:        4.10 cm 1.89 cm/m LA Vol (A2C):   95.3 ml 43.83 ml/m LA Vol (A4C):   66.2 ml 30.45 ml/m LA Biplane Vol: 80.8 ml 37.16 ml/m  AORTIC VALVE LVOT Vmax:   112.00 cm/s LVOT Vmean:  79.100 cm/s LVOT VTI:    0.241 m  AORTA Ao Root diam: 3.10 cm MITRAL VALVE                TRICUSPID VALVE MV Area  (PHT): 3.07 cm     TR Peak grad:   26.0 mmHg MV Decel Time: 247 msec     TR Vmax:        255.00 cm/s MV E velocity: 101.00 cm/s MV A velocity: 95.10 cm/s   SHUNTS MV E/A ratio:  1.06         Systemic VTI:  0.24 m                             Systemic Diam: 2.00 cm Oswaldo Milian MD Electronically signed by Oswaldo Milian MD Signature Date/Time: 06/05/2019/10:46:22 PM    Final     CBC Recent Labs  Lab 06/13/19 0518 06/14/19 5329 06/15/19 0424 06/17/19 0505 06/19/19 0625  WBC 10.9* 10.0 9.0 7.9 7.1  HGB 8.2* 8.5* 8.2* 7.8* 7.6*  HCT 25.3* 25.7* 25.3* 24.2* 24.3*  PLT 293 282 308 324 352  MCV 90.0 89.2 90.7 91.3 91.7  MCH 29.2 29.5 29.4 29.4 28.7  MCHC 32.4 33.1 32.4 32.2 31.3  RDW 17.3* 17.2* 17.0* 17.2* 17.1*    Chemistries  Recent Labs  Lab 06/13/19 0518 06/13/19 0518 06/14/19 0616 06/15/19 0424 06/17/19 0505 06/18/19 1803 06/19/19 0625  NA 142   < > 140 139 139 142 143  K 2.4*   < > 2.6* 4.3 3.0* 3.4* 3.1*  CL 118*   < > 118* 120* 116* 117* 119*  CO2 17*   < > 16* 16* 17* 18* 18*  GLUCOSE 96   < > 104* 118* 111* 113* 104*  BUN 6*   < > 5* 5* <5* <5* <5*  CREATININE 0.98   < > 0.88 0.86 0.87 0.84 0.79  CALCIUM 7.2*   < > 7.2* 7.3* 7.2* 7.4* 7.3*  MG 1.9  --  1.7  --   --  1.5*  --   AST  --   --  26 26  --   --   --   ALT  --   --  15 14  --   --   --   ALKPHOS  --   --  97 98  --   --   --   BILITOT  --   --  0.7 0.5  --   --   --    < > = values in this interval not displayed.    Lab Results  Component Value Date   HGBA1C 6.9 (H) 05/13/2018   ------------------------------------------------------------------------------------------------------------------    Component Value Date/Time   BNP 238.0 (H) 06/05/2019 0421   Barton Dubois M.D on 06/19/2019 at 9:42 AM  Go to [email or web address] - for contact info  [facility] - Office  [phone]

## 2019-06-20 ENCOUNTER — Encounter: Payer: Self-pay | Admitting: Internal Medicine

## 2019-06-20 DIAGNOSIS — N182 Chronic kidney disease, stage 2 (mild): Secondary | ICD-10-CM

## 2019-06-20 DIAGNOSIS — R109 Unspecified abdominal pain: Secondary | ICD-10-CM

## 2019-06-20 DIAGNOSIS — E1122 Type 2 diabetes mellitus with diabetic chronic kidney disease: Secondary | ICD-10-CM

## 2019-06-20 LAB — MAGNESIUM: Magnesium: 1.4 mg/dL — ABNORMAL LOW (ref 1.7–2.4)

## 2019-06-20 LAB — BASIC METABOLIC PANEL
Anion gap: 5 (ref 5–15)
BUN: <5 mg/dL — ABNORMAL LOW (ref 8–23)
CO2: 20 mmol/L — ABNORMAL LOW (ref 22–32)
Calcium: 7.5 mg/dL — ABNORMAL LOW (ref 8.9–10.3)
Chloride: 115 mmol/L — ABNORMAL HIGH (ref 98–111)
Creatinine, Ser: 0.88 mg/dL (ref 0.61–1.24)
GFR calc Af Amer: >60 mL/min (ref >60)
GFR calc non Af Amer: >60 mL/min (ref >60)
Glucose, Bld: 109 mg/dL — ABNORMAL HIGH (ref 70–99)
Potassium: 3.3 mmol/L — ABNORMAL LOW (ref 3.5–5.1)
Sodium: 140 mmol/L (ref 135–145)

## 2019-06-20 LAB — GLUCOSE, CAPILLARY
Glucose-Capillary: 102 mg/dL — ABNORMAL HIGH (ref 70–99)
Glucose-Capillary: 119 mg/dL — ABNORMAL HIGH (ref 70–99)
Glucose-Capillary: 98 mg/dL (ref 70–99)

## 2019-06-20 MED ORDER — AMLODIPINE BESYLATE 5 MG PO TABS: 5.0000 mg | ORAL_TABLET | Freq: Every day | ORAL | 1 refills | Status: DC

## 2019-06-20 MED ORDER — MAGNESIUM OXIDE 400 (241.3 MG) MG PO TABS: 400.0000 mg | ORAL_TABLET | Freq: Every day | ORAL | 1 refills | Status: DC

## 2019-06-20 MED ORDER — VANCOMYCIN 50 MG/ML ORAL SOLUTION: 125.0000 mg | Freq: Three times a day (TID) | ORAL | 0 refills | Status: AC

## 2019-06-20 MED ORDER — ADULT MULTIVITAMIN W/MINERALS CH: 1.0000 | ORAL_TABLET | Freq: Every day | ORAL | 2 refills | Status: DC

## 2019-06-20 MED ORDER — SACCHAROMYCES BOULARDII 250 MG PO CAPS: 250.0000 mg | ORAL_CAPSULE | Freq: Two times a day (BID) | ORAL | 0 refills | Status: DC

## 2019-06-20 MED ORDER — TAMSULOSIN HCL 0.4 MG PO CAPS: 0.4000 mg | ORAL_CAPSULE | Freq: Two times a day (BID) | ORAL | 1 refills | Status: DC

## 2019-06-20 MED ORDER — METFORMIN HCL ER 500 MG PO TB24: 500.0000 mg | ORAL_TABLET | Freq: Every day | ORAL | Status: DC

## 2019-06-20 MED ORDER — POTASSIUM CHLORIDE CRYS ER 20 MEQ PO TBCR: 40.0000 meq | EXTENDED_RELEASE_TABLET | Freq: Every day | ORAL | 1 refills | Status: DC

## 2019-06-20 NOTE — TOC Transition Note (Signed)
Transition of Care [facility]) - CM/SW Discharge Note   Patient Details  Name: Darren Howard MRN: [id number] Date of Birth: 03/24/1950  Transition of Care [facility]) CM/SW Contact:  Ihor Gully, LCSW Phone Number: 06/20/2019, 1:59 PM   Clinical Narrative:    Vaughan Basta with [facility] advised of discharge. RW previously delivered to room. TOC signing off.    Final next level of care: Home w Home Health Services Barriers to Discharge: No Barriers Identified   Patient Goals and CMS Choice        Discharge Placement                       Discharge Plan and Services                          HH Arranged: PT          Social Determinants of Health (SDOH) Interventions     Readmission Risk Interventions Readmission Risk Prevention Plan 05/18/2018 05/14/2018  Post Dischage Appt Complete -  Medication Screening Complete -  Transportation Screening Complete Complete  PCP or Specialist Appt within 5-7 Days - Complete  Home Care Screening - Complete  Medication Review (RN CM) - Complete  Some recent data might be hidden

## 2019-06-20 NOTE — Progress Notes (Signed)
Subjective: Feeling good today. One bowel movement described as mushy but "feels like it's trying to become solid." Denies N/V, hematochezia, melena. No other overt GI complaints.  Objective: Vital signs in last 24 hours: Temp:  [98.9 F (37.2 C)-99.3 F (37.4 C)] 99.3 F (37.4 C) (05/03 0509) Pulse Rate:  [65-71] 71 (05/03 0509) Resp:  [18] 18 (05/03 0509) BP: (143-173)/(61-81) 173/77 (05/03 0509) SpO2:  [100 %] 100 % (05/03 0509) Last BM Date: 06/18/19 General:   Alert and oriented, pleasant Head:  Normocephalic and atraumatic. Eyes:  No icterus, sclera clear. Conjuctiva pink.  Heart:  S1, S2 present, no murmurs noted.  Lungs: Clear to auscultation bilaterally, without wheezing, rales, or rhonchi.  Abdomen:  Bowel sounds present, soft, non-tender, non-distended. No HSM or hernias noted. No rebound or guarding. Msk:  Symmetrical without gross deformities. Pulses:  Normal DP pulses noted. Extremities:  Without clubbing or edema. Neurologic:  Alert and  oriented x4;  grossly normal neurologically. Psych:  Alert and cooperative. Normal mood and affect.  Intake/Output from previous day: 05/02 0701 - 05/03 0700 In: 960 [P.O.:960] Out: 1403 [Urine:1400; Stool:3] Intake/Output this shift: No intake/output data recorded.  Lab Results: Recent Labs    06/19/19 0625  WBC 7.1  HGB 7.6*  HCT 24.3*  PLT 352   BMET Recent Labs    06/18/19 1803 06/19/19 0625  NA 142 143  K 3.4* 3.1*  CL 117* 119*  CO2 18* 18*  GLUCOSE 113* 104*  BUN <5* <5*  CREATININE 0.84 0.79  CALCIUM 7.4* 7.3*   LFT Recent Labs    06/19/19 0625  ALBUMIN 2.1*   PT/INR No results for input(s): LABPROT, INR in the last 72 hours. Hepatitis Panel Recent Labs    06/19/19 0625  HEPBSAG NON REACTIVE  HCVAB NON REACTIVE     Studies/Results: No results found.  Assessment: 69 year old male with history of alcohol abuse, chronic anemia with blood transfusions on several prior occasions  during hospitalization last year, admitted with suspected right-sided pyelonephritis and findings of possible stercoral colitis on CT abdomen/pelvis with contrast but more likely result of bladder distention with impingement on the colon, persistent hypokalemia in the setting of multiple watery stools.  Acute on chronic anemia: Heme positive but no overt GI bleeding. Has received 2 units of packed red blood cells this admission with lowest hemoglobin of 6.5 prior to transfusion. H&H has been stable for several days. EGD last year with erosive gastropathy, duodenal erosions, likely NSAID effect. Colonoscopy per below with poor prep, will need repeat as outpatient for surveillance.  Diarrhea with pancolitis on repeat imaging: C. Difficile (x2) and GI pathogen panelnegative. Multiple antibiotics this admission including cefepime, IV vancomycin, Rocephin, Augmentin, Flagyl. Repeat CT abdomen pelvis with contrast on April 26 with progression of pancolitis. Colonoscopy 06/15/19 (prep was poor and inadequate for polyp or small lesion detection) with mucosa of the colon appeared diffusely edematous but no erosions, ulcerations, pseudomembranes. Sigmoid colon appeared to be thickened with a perpendicularly ringed appearance; pale and appeared to have crpe paper/cobblestoned appearance. Multiple biopsies taken and negative for pathologic findings. Likely diarrhea multifactorial including previous large doses oral potassium, multiple antibiotics, ETOH withdrawal. Also question of diarrhea from malabsorption given hypoalbuminemia, stool fecal fat ordered and pending. Symptoms continued to significantly improve over the weekend.  Hypokalemia- initial large losses likely due to severe diarrhea, improved with high dose oral potassium and multiple runs of IV potassium. No BMP today, no potassium yet ordered.  Stools today  continue to improve, one bowel movement mushy ("trying to become solid"). No othe overt GI  complaints. Discussed with Dr. Dyann Kief who plans to d/c today.   Plan: 1. Finish 10 day course of Vancomycin 2. Follow for fecal fat results 3. Absolute ETOH avoidance 4. Outpatient follow-up with GI 4 weeks.   Thank you for allowing [place] to participate in the care of Darren Layman, DNP, AGNP-C Adult & Gerontological Nurse Practitioner [facility]    LOS: 15 days    06/20/2019, 7:37 AM

## 2019-06-20 NOTE — Progress Notes (Signed)
Nsg Discharge Note  Admit Date:  06/04/2019 Discharge date: 06/20/2019   Darren Howard to be D/C'd home  per MD order.  AVS completed.  Copy for chart, and copy for patient signed, and dated. Patient/caregiver able to verbalize understanding.  Discharge Medication: Allergies as of 06/20/2019   No Known Allergies     Medication List    STOP taking these medications   ferrous sulfate 325 (65 FE) MG tablet   polyethylene glycol 17 g packet Commonly known as: MIRALAX / GLYCOLAX   senna 8.6 MG Tabs tablet Commonly known as: SENOKOT   Vitamin D (Ergocalciferol) 1.25 MG (50000 UNIT) Caps capsule Commonly known as: DRISDOL     TAKE these medications   amLODipine 5 MG tablet Commonly known as: NORVASC Take 1 tablet (5 mg total) by mouth daily. Start taking on: Jun 21, 2019 What changed:   medication strength  how much to take   atorvastatin 10 MG tablet Commonly known as: LIPITOR Take 10 mg by mouth at bedtime.   calcium carbonate 500 MG chewable tablet Commonly known as: TUMS - dosed in mg elemental calcium Chew 2 tablets (400 mg of elemental calcium total) by mouth 2 (two) times daily with a meal.   carvedilol 25 MG tablet Commonly known as: COREG Take 25 mg by mouth 2 (two) times daily.   gabapentin 300 MG capsule Commonly known as: NEURONTIN Take 300 mg by mouth 3 (three) times daily.   hydrocortisone 2.5 % rectal cream Commonly known as: ANUSOL-HC Place rectally 3 (three) times daily. Apply to anorectum every 6 hours x 10 days   magnesium oxide 400 (241.3 Mg) MG tablet Commonly known as: MAG-OX Take 1 tablet (400 mg total) by mouth daily. Start taking on: Jun 21, 2019 What changed: when to take this   metFORMIN 500 MG 24 hr tablet Commonly known as: GLUCOPHAGE-XR Take 1 tablet (500 mg total) by mouth daily with breakfast. What changed: when to take this   multivitamin with minerals Tabs tablet Take 1 tablet by mouth daily. Start taking on: Jun 21, 2019    pantoprazole 40 MG tablet Commonly known as: PROTONIX Take 1 tablet (40 mg total) by mouth daily for 30 days.   potassium chloride SA 20 MEQ tablet Commonly known as: KLOR-CON Take 2 tablets (40 mEq total) by mouth daily. Start taking on: Jun 21, 2019   saccharomyces boulardii 250 MG capsule Commonly known as: FLORASTOR Take 1 capsule (250 mg total) by mouth 2 (two) times daily.   tamsulosin 0.4 MG Caps capsule Commonly known as: FLOMAX Take 1 capsule (0.4 mg total) by mouth 2 (two) times daily.   vancomycin 50 mg/mL  oral solution Commonly known as: VANCOCIN Take 2.5 mLs (125 mg total) by mouth in the morning, at noon, and at bedtime for 3 days.            Durable Medical Equipment  (From admission, onward)         Start     Ordered   06/17/19 1710  For home use only DME Walker rolling  Once    Question Answer Comment  Walker: With 5 Inch Wheels   Patient needs a walker to treat with the following condition Physical deconditioning      06/17/19 1709          Discharge Assessment: Vitals:   06/20/19 0509 06/20/19 1407  BP: (!) 173/77 (!) 173/79  Pulse: 71 64  Resp: 18 17  Temp: 99.3 F (37.4  C) 98.8 F (37.1 C)  SpO2: 100% 100%   Skin clean, dry and intact without evidence of skin break down, no evidence of skin tears noted. IV catheter discontinued intact. Site without signs and symptoms of complications - no redness or edema noted at insertion site, patient denies c/o pain - only slight tenderness at site.  Dressing with slight pressure applied.  D/c Instructions-Education: Discharge instructions given to patient/family with verbalized understanding. D/c education completed with patient/family including follow up instructions, medication list, d/c activities limitations if indicated, with other d/c instructions as indicated by MD - patient able to verbalize understanding, all questions fully answered. Patient instructed to return to ED, call 911, or call  MD for any changes in condition.  Patient escorted via [place], and D/C home via private auto.  Zachery Conch, RN 06/20/2019 5:48 PM

## 2019-06-20 NOTE — Discharge Summary (Signed)
Physician Discharge Summary  Darren Howard CVE:[id number] DOB: 1950-09-16 DOA: 06/04/2019  PCP: [name], MD  Admit date: 06/04/2019 Discharge date: 06/20/2019  Time spent: 35 minutes  Recommendations for Outpatient Follow-up:  1. Repeat CBC to follow hemoglobin trend 2. Repeat basic metabolic panel to electrolytes and renal function 3. Continue to follow CBGs and blood pressure with further adjustment to hypoglycemic and antihypertensive regimen as required.   Discharge Diagnoses:  Principal Problem:   Colitis, acute---??? infectious pancolitis Active Problems:   Hypertension   Normocytic anemia   DM2 (diabetes mellitus, type 2) (HCC)   Hypokalemia   Hypomagnesemia   Hypocalcemia   Alcohol abuse   Tobacco use   Pyelonephritis of right kidney   Pyelonephritis   Abnormal CT scan, colon   Diarrhea   Urinary retention   Abdominal pain   Discharge Condition: Stable and improved.  Discharged home with instruction to follow-up with PCP and with urology as an outpatient.  CODE STATUS: Full code.  Diet recommendation: Heart healthy/modified carbohydrate diet.  Filed Weights   06/06/19 0500 06/07/19 0455 06/08/19 0500  Weight: 97.8 kg 98.3 kg 98.7 kg    History of present illness:  As per H&P written by Dr. Olevia Bowens on 06/05/2019 69 y.o. male with medical history significant of alcohol abuse, chronic normocytic anemia, medication and treatment noncompliance, history of duodenal reduction, erosive gastropathy, GI bleed, glucose intolerance/type 2 diabetes, hyperlipidemia, hypertension who is coming to the emergency department due to bilateral lower extremities edema for several weeks, diarrhea with left lower quadrant tenderness since last week and dysuria, frequency and urgency for the past 3 days.  He mentioned initially to the staff that he has not taking his medications because he is "too lazy".  He also stated that he drinks a couple of shots every day.  He denies fever,  chills, night sweats, sore throat, wheezing or hemoptysis.  No chest pain, palpitations, diaphoresis, PND, but complains of orthopnea and lower extremity edema.  He denies polyuria, polydipsia, polyphagia or blurred vision.  ED Course: Initial vital signs temperature 98.8 F, pulse 94, respiration 19, blood pressure 104/74 mmHg and O2 sat 100% on room air.  The patient received a 1000 mL LR bolus, unknown source antibiotic coverage with cefepime, metronidazole and vancomycin.  He also received oral and IV potassium replacement.  Urinalysis was normal.  UDS negative.  White count is 9.7, hemoglobin 7.6 g/dL and platelets 387.  PT 15.3 and INR 1.2.  Lipase was 12 units/L.  Alcohol less than 10 mg/dL.  Lactic acid was 2.7, potassium 2.4, chloride 114, CO2 15 mmol/L.  He has a normal anion gap.  BUN was 29, creatinine 1.38, glucose 124 and calcium 6.4 mg/dL.  Total protein 6.0 and albumin 2.6 g/dL.  AST was 43, ALT 32, alkaline phosphatase 111 and total bilirubin 1.0.  Imaging: 1 view chest radiograph did not show any active disease.  CT abdomen and pelvis showed distended urinary bladder with mild bilateral hydronephrosis and finding of right-sided pyelonephritis.  Thickening appearance of the rectosigmoid with surrounding stranding which may be related to UTI, but concerning for stercoral colitis.  Please see images and full radiology report for further detail.   Hospital Course:  1-pancolitis/diarrhea -Multifactorial -After discussing with ID and GI final decision given for completion of 10 days of oral vancomycin. -C. difficile by PCR and GI pathogen negative. -No pathology findings appreciated on biopsies taken during sigmoidoscopy -Patient will continue the use of Anusol for hemorrhoids -Increase fiber in diet -  Stop alcohol consumption -Continue the use of Florastor as instructed. -At discharge loose stools but becoming solid, tolerating diet, stable electrolytes and no signs of bleeding or  active infection.  2-electrolyte abnormalities (including hypocalcemia, hypokalemia, hypomagnesemia and hypoalbuminemia) -In the setting of poor nutrition, alcohol abuse and GI losses -After aggressive repletion and supplementation electrolytes stable for discharge. -Patient will resume the use of calcium, magnesium and potassium on daily basis. -Advised to maintain adequate nutrition. -No telemetry abnormalities appreciated.  3-acute symptomatic blood loss anemia -Hemoglobin down to 6.5 -2 units of PRBCs transfused during hospitalization; hemoglobin is stable at 8.2 at time of discharge. -No further signs of ongoing bleeding appreciated. -Repeat CBC to follow hemoglobin trend at follow-up visit.  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 -Continue patient follow-up with PCP to further adjust hypoglycemic regimen. 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  5-BPH/urinary retention -Patient failed voiding trials twice throughout his hospitalization and has been discharged with Foley catheter and instruction to follow-up with urology service as an outpatient. -Patient started on Flomax. -No dysuria or signs of acute infection appreciated currently.  6-essential hypertension -Is stable and well-controlled -Continue carvedilol, amlodipine and Flomax currently. -Heart healthy diet has been emphasized.  7-alcohol abuse with DTs -DTs resolved -Cessation counseling provided -Multivitamins instructed to further supplied thiamine and folic acid on daily basis. -Patient looking not to drink any longer.  8-physical deconditioning -Appreciate evaluation by physical therapy -Home health PT has been recommended to assist with conditioning and rehab.  Procedures:  See below for x-ray reports  Flex-sigmoidoscopy:(prep was poor and inadequate for polyp or  small lesion detection) with mucosa of the colon appeared diffusely edematous but no erosions, ulcerations, pseudomembranes. Sigmoid colon appeared to be thickened with a perpendicularly ringed appearance; pale and appeared to have crpe paper/cobblestoned appearance. Multiple biopsies taken and negative for pathologic findings.   Consultations:  GI service  ID curbside (Dr.Van Dam): No active bleeding appreciated.  Discharge Exam: Vitals:   06/19/19 2121 06/20/19 0509  BP: (!) 165/78 (!) 173/77  Pulse: 67 71  Resp: 18 18  Temp: 99.3 F (37.4 C) 99.3 F (37.4 C)  SpO2: 100% 100%   General exam: Alert, awake, oriented x 3; feeling better and reporting good appetite.  No nausea or vomiting.  Reports having 1 mushy/loose bowel movement overnight.  No melena or overt bleeding.  Patient has failed voiding trials and will be discharged with Foley catheter in place.   Respiratory system: Clear to auscultation. Respiratory effort normal. Cardiovascular system:RRR. No murmurs, rubs, gallops. Gastrointestinal system: Abdomen is nondistended, soft and nontender. No organomegaly or masses felt. Normal bowel sounds heard. Central nervous system: Alert and oriented. No focal neurological deficits. Extremities: No C/C/E, +pedal pulses Skin: No rashes, lesions or ulcers Psychiatry: Judgement and insight appear normal. Mood & affect appropriate.   Discharge Instructions   Discharge Instructions    Diet - low sodium heart healthy   Complete by: As directed    Discharge instructions   Complete by: As directed    Take medications as prescribed Maintain adequate hydration Arrange follow-up with PCP in 10 days Follow-up with gastroenterology service as instructed Increase fiber in the diet Follow heart healthy diet.   Increase activity slowly   Complete by: As directed      Allergies as of 06/20/2019   No Known Allergies     Medication List  STOP taking these medications   ferrous  sulfate 325 (65 FE) MG tablet   polyethylene glycol 17 g packet Commonly known as: MIRALAX / GLYCOLAX   senna 8.6 MG Tabs tablet Commonly known as: SENOKOT   Vitamin D (Ergocalciferol) 1.25 MG (50000 UNIT) Caps capsule Commonly known as: DRISDOL     TAKE these medications   amLODipine 5 MG tablet Commonly known as: NORVASC Take 1 tablet (5 mg total) by mouth daily. Start taking on: Jun 21, 2019 What changed:   medication strength  how much to take   atorvastatin 10 MG tablet Commonly known as: LIPITOR Take 10 mg by mouth at bedtime.   calcium carbonate 500 MG chewable tablet Commonly known as: TUMS - dosed in mg elemental calcium Chew 2 tablets (400 mg of elemental calcium total) by mouth 2 (two) times daily with a meal.   carvedilol 25 MG tablet Commonly known as: COREG Take 25 mg by mouth 2 (two) times daily.   gabapentin 300 MG capsule Commonly known as: NEURONTIN Take 300 mg by mouth 3 (three) times daily.   hydrocortisone 2.5 % rectal cream Commonly known as: ANUSOL-HC Place rectally 3 (three) times daily. Apply to anorectum every 6 hours x 10 days   magnesium oxide 400 (241.3 Mg) MG tablet Commonly known as: MAG-OX Take 1 tablet (400 mg total) by mouth daily. Start taking on: Jun 21, 2019 What changed: when to take this   metFORMIN 500 MG 24 hr tablet Commonly known as: GLUCOPHAGE-XR Take 1 tablet (500 mg total) by mouth daily with breakfast. What changed: when to take this   multivitamin with minerals Tabs tablet Take 1 tablet by mouth daily. Start taking on: Jun 21, 2019   pantoprazole 40 MG tablet Commonly known as: PROTONIX Take 1 tablet (40 mg total) by mouth daily for 30 days.   potassium chloride SA 20 MEQ tablet Commonly known as: KLOR-CON Take 2 tablets (40 mEq total) by mouth daily. Start taking on: Jun 21, 2019   saccharomyces boulardii 250 MG capsule Commonly known as: FLORASTOR Take 1 capsule (250 mg total) by mouth 2 (two) times  daily.   tamsulosin 0.4 MG Caps capsule Commonly known as: FLOMAX Take 1 capsule (0.4 mg total) by mouth 2 (two) times daily.   vancomycin 50 mg/mL  oral solution Commonly known as: VANCOCIN Take 2.5 mLs (125 mg total) by mouth in the morning, at noon, and at bedtime for 3 days.            Durable Medical Equipment  (From admission, onward)         Start     Ordered   06/17/19 1710  For home use only DME Walker rolling  Once    Question Answer Comment  Walker: With [place] Wheels   Patient needs a walker to treat with the following condition Physical deconditioning      06/17/19 1709         No Known Allergies Follow-up Information    [name], MD. Schedule an appointment as soon as possible for a visit in 10 day(s).   Specialty: Family Medicine Contact information: [address] [place] [address] [phone]        Daneil Dolin, MD. Schedule an appointment as soon as possible for a visit today.   Specialty: Gastroenterology Why: office will contact you with appointment details Contact information: [address] [place] [address] [phone]        [facility]  [facility]. Schedule an appointment as soon as possible for a visit in 10 day(s).   Why: for further evaluation and management of urinary retention, further management of BPH and cystourethrogram (if needed). Contact information: [address] [place] [phone]          The results of significant diagnostics from this hospitalization (including imaging, microbiology, ancillary and laboratory) are listed below for reference.    Significant Diagnostic Studies: CT ABDOMEN PELVIS W CONTRAST  Result Date: 06/13/2019 CLINICAL DATA:  Diarrhea. EXAM: CT ABDOMEN AND PELVIS WITH CONTRAST TECHNIQUE: Multidetector CT imaging of the abdomen and pelvis was performed using the standard protocol following bolus administration of  intravenous contrast. CONTRAST:  155mL OMNIPAQUE IOHEXOL 300 MG/ML  SOLN COMPARISON:  June 04, 2019. FINDINGS: Lower chest: Mild bilateral pleural effusions are noted with adjacent subsegmental atelectasis. Hepatobiliary: Hepatic steatosis is noted. No gallstones are noted. No biliary dilatation is noted. Pancreas: Unremarkable. No pancreatic ductal dilatation or surrounding inflammatory changes. Spleen: Normal in size without focal abnormality. Adrenals/Urinary Tract: Adrenal glands appear normal. Right renal cysts are noted. No renal or ureteral calculi are noted. Minimal left hydroureteronephrosis is noted without obstructing calculus. Urinary bladder is decompressed secondary to Foley catheter. Stomach/Bowel: The stomach appears normal. The appendix is not visualized. Moderate to severe wall thickening is seen involving the transverse, descending and sigmoid colon and rectum concerning for inflammatory or infectious colitis. There is no evidence of bowel obstruction. Vascular/Lymphatic: No significant vascular findings are present. No enlarged abdominal or pelvic lymph nodes. Reproductive: Prostate is unremarkable. Other: No abdominal wall hernia or abnormality. No abdominopelvic ascites. Musculoskeletal: No acute or significant osseous findings. IMPRESSION: 1. Hepatic steatosis. 2. Mild bilateral pleural effusions are noted with adjacent subsegmental atelectasis. 3. Minimal left hydroureteronephrosis is noted without obstructing calculus. 4. Moderate to severe wall thickening is seen involving the transverse, descending and sigmoid colon and rectum concerning for inflammatory or infectious colitis. Electronically Signed   By: Marijo Conception M.D.   On: 06/13/2019 12:44   CT Abdomen Pelvis W Contrast  Result Date: 06/04/2019 CLINICAL DATA:  69 year old male with abdominal distension and diarrhea. Concern for acute diverticulitis. EXAM: CT ABDOMEN AND PELVIS WITH CONTRAST TECHNIQUE: Multidetector CT imaging  of the abdomen and pelvis was performed using the standard protocol following bolus administration of intravenous contrast. CONTRAST:  157mL OMNIPAQUE IOHEXOL 300 MG/ML  SOLN COMPARISON:  None. FINDINGS: Lower chest: The visualized lung bases are clear. No intra-abdominal free air or free fluid. Hepatobiliary: Severe fatty infiltration of the liver. No intrahepatic biliary ductal dilatation. The gallbladder is unremarkable. Pancreas: Atrophic pancreas. No Accu to inflammatory changes. No dilatation of the main pancreatic duct. Spleen: Normal in size without focal abnormality. Adrenals/Urinary Tract: The adrenal glands are unremarkable. There is mild bilateral hydronephrosis, right greater left. There is enhancement of the right renal urothelium concerning for pyelonephritis. Correlation with urinalysis recommended. There is slight delayed enhancement of the right renal parenchyma. Multiple right renal cysts measure up to 4 cm in the interpolar aspect of the right kidney. Additional subcentimeter right renal hypodense lesions are too small to characterize. The urinary bladder is distended. Stomach/Bowel: There is narrowing of a segment of the rectosigmoid, likely related to mass effect caused by distended urinary bladder. There is mild thickened appearance of the segment of colon with surrounding stranding. The perisigmoid stranding may be related to UTI but concerning for stercoral colitis. Clinical correlation is recommended. There is probable mild associate luminal narrowing  of the sigmoid colon. No evidence of small-bowel obstruction. The appendix is not visualized with certainty. No inflammatory changes identified in the right lower quadrant. Vascular/Lymphatic: The abdominal aorta and IVC unremarkable. No portal venous gas. There is no adenopathy. Reproductive: The prostate and seminal vesicles are grossly unremarkable. Partially visualized fluid within the left inguinal canal likely related to hydrocele.  Other: Mild diffuse subcutaneous edema. Musculoskeletal: Degenerative changes of the spine. No acute osseous pathology. IMPRESSION: 1. Distended urinary bladder with mild bilateral hydronephrosis and findings of right-sided pyelonephritis. Correlation with urinalysis recommended. 2. Thickened appearance of the rectosigmoid with surrounding stranding which may be related to UTI but concerning for stercoral colitis. Clinical correlation is recommended. 3. Severe fatty liver. Electronically Signed   By: Anner Crete M.D.   On: 06/04/2019 23:41   US Venous Img Lower Bilateral (DVT)  Result Date: 06/05/2019 CLINICAL DATA:  Bilateral lower extremity pain and edema for 1 month. EXAM: BILATERAL LOWER EXTREMITY VENOUS DOPPLER ULTRASOUND TECHNIQUE: Gray-scale sonography with compression, as well as color and duplex ultrasound, were performed to evaluate the deep venous system(s) from the level of the common femoral vein through the popliteal and proximal calf veins. COMPARISON:  None. FINDINGS: VENOUS Normal compressibility of the common femoral, superficial femoral, and popliteal veins, as well as the visualized calf veins. Visualized portions of profunda femoral vein and great saphenous vein unremarkable. No filling defects to suggest DVT on grayscale or color Doppler imaging. Doppler waveforms show normal direction of venous flow, normal respiratory phasicity and response to augmentation. OTHER None. Limitations: none IMPRESSION: No femoropopliteal DVT nor evidence of DVT within the visualized calf veins. If clinical symptoms are inconsistent or if there are persistent or worsening symptoms, further imaging (possibly involving the iliac veins) may be warranted. Electronically Signed   By: Abigail Miyamoto M.D.   On: 06/05/2019 11:44   DG Chest Port 1 View  Result Date: 06/04/2019 CLINICAL DATA:  Tachycardia, bilateral lower extremity edema EXAM: PORTABLE CHEST 1 VIEW COMPARISON:  05/13/2018 FINDINGS: The heart size  and mediastinal contours are within normal limits. Both lungs are clear. The visualized skeletal structures are unremarkable. IMPRESSION: No active disease. Electronically Signed   By: Randa Ngo M.D.   On: 06/04/2019 20:32   DG Abd 2 Views  Result Date: 06/11/2019 CLINICAL DATA:  Abdominal pain. EXAM: ABDOMEN - 2 VIEW COMPARISON:  None. FINDINGS: No free air, portal venous gas, or pneumatosis. Numerous air-filled mildly prominent but nondilated loops of small bowel. No gas in the region of the rectum. No other acute abnormalities. IMPRESSION: 1. Numerous mildly prominent loops of small bowel without significant dilatation. Developing ileus favored. Very early small bowel obstruction not definitely excluded despite the lack of definitive dilatation as no definitive colonic gas is noted. Recommend clinical correlation and attention on follow-up. Electronically Signed   By: Dorise Bullion III M.D   On: 06/11/2019 13:29   ECHOCARDIOGRAM COMPLETE  Result Date: 06/05/2019    ECHOCARDIOGRAM REPORT   Patient Name:   Darren Howard Date of Exam: 06/05/2019 Medical Rec #:  [id number]     Height:       73.0 in Accession #:    [id number]    Weight:       205.0 lb Date of Birth:  Jul 04, 1950    BSA:          2.174 m Patient Age:    15 years      BP:  152/86 mmHg Patient Gender: M             HR:           90 bpm. Exam Location:  [facility] Procedure: 2D Echo, Cardiac Doppler and Color Doppler Indications:    Lower extremity edema [218969]                 Alcohol abuse [[id number]]  History:        Patient has no prior history of Echocardiogram examinations.                 Risk Factors:Hypertension, Diabetes, Current Smoker and                 Dyslipidemia.  Sonographer:    Alvino Chapel RCS Referring Phys: [place]  1. Left ventricular ejection fraction, by estimation, is 60 to 65%. The left ventricle has normal function. The left ventricle has no regional wall motion abnormalities. There  is moderate left ventricular hypertrophy. Left ventricular diastolic parameters were normal.  2. Right ventricular systolic function is normal. The right ventricular size is normal.  3. Left atrial size was mildly dilated.  4. The mitral valve is normal in structure. No evidence of mitral valve regurgitation.  5. The aortic valve is tricuspid. Aortic valve regurgitation is not visualized. No aortic stenosis is present. FINDINGS  Left Ventricle: Left ventricular ejection fraction, by estimation, is 60 to 65%. The left ventricle has normal function. The left ventricle has no regional wall motion abnormalities. The left ventricular internal cavity size was normal in size. There is  moderate left ventricular hypertrophy. Left ventricular diastolic parameters were normal. Right Ventricle: The right ventricular size is normal. Right vetricular wall thickness was not assessed. Right ventricular systolic function is normal. There is mildly elevated pulmonary artery systolic pressure. The tricuspid regurgitant velocity is 2.55 m/s, and with an assumed right atrial pressure of 8 mmHg, the estimated right ventricular systolic pressure is 99.3 mmHg. Left Atrium: Left atrial size was mildly dilated. Right Atrium: Right atrial size was normal in size. Pericardium: Trivial pericardial effusion is present. Presence of pericardial fat pad. Mitral Valve: The mitral valve is normal in structure. No evidence of mitral valve regurgitation. Tricuspid Valve: The tricuspid valve is normal in structure. Tricuspid valve regurgitation is trivial. Aortic Valve: The aortic valve is tricuspid. Aortic valve regurgitation is not visualized. No aortic stenosis is present. Pulmonic Valve: The pulmonic valve was not well visualized. Pulmonic valve regurgitation is not visualized. Aorta: The aortic root is normal in size and structure. IAS/Shunts: The interatrial septum was not well visualized.  LEFT VENTRICLE PLAX 2D LVIDd:         4.85 cm   Diastology LVIDs:         3.13 cm  LV e' lateral:   10.80 cm/s LV PW:         1.35 cm  LV E/e' lateral: 9.4 LV IVS:        1.41 cm  LV e' medial:    9.03 cm/s LVOT diam:     2.00 cm  LV E/e' medial:  11.2 LV SV:         76 LV SV Index:   35 LVOT Area:     3.14 cm  RIGHT VENTRICLE RV Mid diam:    3.04 cm RV S prime:     19.60 cm/s TAPSE (M-mode): 2.1 cm LEFT ATRIUM  Index LA diam:        4.10 cm 1.89 cm/m LA Vol (A2C):   95.3 ml 43.83 ml/m LA Vol (A4C):   66.2 ml 30.45 ml/m LA Biplane Vol: 80.8 ml 37.16 ml/m  AORTIC VALVE LVOT Vmax:   112.00 cm/s LVOT Vmean:  79.100 cm/s LVOT VTI:    0.241 m  AORTA Ao Root diam: 3.10 cm MITRAL VALVE                TRICUSPID VALVE MV Area (PHT): 3.07 cm     TR Peak grad:   26.0 mmHg MV Decel Time: 247 msec     TR Vmax:        255.00 cm/s MV E velocity: 101.00 cm/s MV A velocity: 95.10 cm/s   SHUNTS MV E/A ratio:  1.06         Systemic VTI:  0.24 m                             Systemic Diam: 2.00 cm Oswaldo Milian MD Electronically signed by Oswaldo Milian MD Signature Date/Time: 06/05/2019/10:46:22 PM    Final     Microbiology: Recent Results (from the past 240 hour(s))  Gastrointestinal Panel by PCR , Stool     Status: None   Collection Time: 06/13/19  3:35 PM   Specimen: Stool  Result Value Ref Range Status   Campylobacter species NOT DETECTED NOT DETECTED Final   Plesimonas shigelloides NOT DETECTED NOT DETECTED Final   Salmonella species NOT DETECTED NOT DETECTED Final   Yersinia enterocolitica NOT DETECTED NOT DETECTED Final   Vibrio species NOT DETECTED NOT DETECTED Final   Vibrio cholerae NOT DETECTED NOT DETECTED Final   Enteroaggregative E coli (EAEC) NOT DETECTED NOT DETECTED Final   Enteropathogenic E coli (EPEC) NOT DETECTED NOT DETECTED Final   Enterotoxigenic E coli (ETEC) NOT DETECTED NOT DETECTED Final   Shiga like toxin producing E coli (STEC) NOT DETECTED NOT DETECTED Final   Shigella/Enteroinvasive E coli (EIEC) NOT  DETECTED NOT DETECTED Final   Cryptosporidium NOT DETECTED NOT DETECTED Final   Cyclospora cayetanensis NOT DETECTED NOT DETECTED Final   Entamoeba histolytica NOT DETECTED NOT DETECTED Final   Giardia lamblia NOT DETECTED NOT DETECTED Final   Adenovirus F40/41 NOT DETECTED NOT DETECTED Final   Astrovirus NOT DETECTED NOT DETECTED Final   Norovirus GI/GII NOT DETECTED NOT DETECTED Final   Rotavirus A NOT DETECTED NOT DETECTED Final   Sapovirus (I, II, IV, and V) NOT DETECTED NOT DETECTED Final    Comment: Performed at [facility], [address] [place], [place] [address]  C Difficile Quick Screen w PCR reflex     Status: None   Collection Time: 06/13/19  3:35 PM  Result Value Ref Range Status   C Diff antigen NEGATIVE NEGATIVE Final   C Diff toxin NEGATIVE NEGATIVE Final   C Diff interpretation No C. difficile detected.  Final    Comment: Performed at [facility], [address] [place], [place] [address]     Labs: Basic Metabolic Panel: Recent Labs  Lab 06/14/19 [phone] 06/14/19 3875 06/15/19 0424 06/17/19 0505 06/18/19 1803 06/19/19 0625 06/20/19 0816  NA 140   < > 139 139 142 143 140  K 2.6*   < > 4.3 3.0* 3.4* 3.1* 3.3*  CL 118*   < > 120* 116* 117* 119* 115*  CO2 16*   < > 16* 17* 18* 18*  20*  GLUCOSE 104*   < > 118* 111* 113* 104* 109*  BUN 5*   < > 5* <5* <5* <5* <5*  CREATININE 0.88   < > 0.86 0.87 0.84 0.79 0.88  CALCIUM 7.2*   < > 7.3* 7.2* 7.4* 7.3* 7.5*  MG 1.7  --   --   --  1.5*  --  1.4*  PHOS  --   --   --   --  2.5  --   --    < > = values in this interval not displayed.   Liver Function Tests: Recent Labs  Lab 06/14/19 0616 06/15/19 0424 06/19/19 0625  AST 26 26  --   ALT 15 14  --   ALKPHOS 97 98  --   BILITOT 0.7 0.5  --   PROT 5.2* 5.4*  --   ALBUMIN 2.0* 2.0* 2.1*   CBC: Recent Labs  Lab 06/14/19 0616 06/15/19 0424 06/17/19 0505 06/19/19 0625  WBC 10.0 9.0 7.9 7.1  HGB 8.5* 8.2* 7.8* 7.6*  HCT 25.7* 25.3* 24.2* 24.3*   MCV 89.2 90.7 91.3 91.7  PLT 282 308 324 352   BNP (last 3 results) Recent Labs    06/04/19 2005 06/05/19 0421  BNP 292.0* 238.0*    CBG: Recent Labs  Lab 06/19/19 1128 06/19/19 1624 06/19/19 2122 06/20/19 0721 06/20/19 1105  GLUCAP 103* 112* 102* 102* 119*    Signed:  Barton Dubois MD.  [facility] 06/20/2019, 1:37 PM

## 2019-06-20 NOTE — Care Management Important Message (Signed)
Important Message  Patient Details  Name: Darren Howard MRN: [id number] Date of Birth: December 02, 1950   Medicare Important Message Given:  Yes     [name] 06/20/2019, 2:20 PM

## 2019-06-21 DIAGNOSIS — N12 Tubulo-interstitial nephritis, not specified as acute or chronic: Secondary | ICD-10-CM | POA: Diagnosis not present

## 2019-06-21 DIAGNOSIS — E8809 Other disorders of plasma-protein metabolism, not elsewhere classified: Secondary | ICD-10-CM | POA: Diagnosis not present

## 2019-06-21 DIAGNOSIS — N401 Enlarged prostate with lower urinary tract symptoms: Secondary | ICD-10-CM | POA: Diagnosis not present

## 2019-06-21 DIAGNOSIS — R338 Other retention of urine: Secondary | ICD-10-CM | POA: Diagnosis not present

## 2019-06-21 DIAGNOSIS — E1122 Type 2 diabetes mellitus with diabetic chronic kidney disease: Secondary | ICD-10-CM | POA: Diagnosis not present

## 2019-06-21 DIAGNOSIS — D500 Iron deficiency anemia secondary to blood loss (chronic): Secondary | ICD-10-CM | POA: Diagnosis not present

## 2019-06-21 DIAGNOSIS — K5289 Other specified noninfective gastroenteritis and colitis: Secondary | ICD-10-CM | POA: Diagnosis not present

## 2019-06-21 DIAGNOSIS — I10 Essential (primary) hypertension: Secondary | ICD-10-CM | POA: Diagnosis not present

## 2019-06-21 DIAGNOSIS — N182 Chronic kidney disease, stage 2 (mild): Secondary | ICD-10-CM | POA: Diagnosis not present

## 2019-06-21 DIAGNOSIS — F1010 Alcohol abuse, uncomplicated: Secondary | ICD-10-CM | POA: Diagnosis not present

## 2019-06-21 DIAGNOSIS — E876 Hypokalemia: Secondary | ICD-10-CM | POA: Diagnosis not present

## 2019-06-22 ENCOUNTER — Telehealth: Payer: Self-pay | Admitting: *Deleted

## 2019-06-22 NOTE — Telephone Encounter (Signed)
Pt called in to make an appointment.  Confirmed his appointment that had already been made by the hospital.

## 2019-06-23 LAB — FECAL FAT, QUALITATIVE
Fat Qual Neutral, Stl: NORMAL
Fat Qual Total, Stl: NORMAL

## 2019-06-29 ENCOUNTER — Other Ambulatory Visit: Payer: Self-pay

## 2019-06-29 ENCOUNTER — Encounter: Payer: Self-pay | Admitting: Urology

## 2019-06-29 ENCOUNTER — Ambulatory Visit: Payer: PPO | Admitting: Urology

## 2019-06-29 ENCOUNTER — Ambulatory Visit (INDEPENDENT_AMBULATORY_CARE_PROVIDER_SITE_OTHER): Payer: PPO | Admitting: Urology

## 2019-06-29 VITALS — BP 107/75 | HR 94 | Temp 97.7°F | Ht 74.0 in | Wt 205.0 lb

## 2019-06-29 DIAGNOSIS — R339 Retention of urine, unspecified: Secondary | ICD-10-CM

## 2019-06-29 MED ORDER — TAMSULOSIN HCL 0.4 MG PO CAPS: 0.4000 mg | ORAL_CAPSULE | Freq: Two times a day (BID) | ORAL | 11 refills | Status: DC

## 2019-06-29 NOTE — Patient Instructions (Signed)

## 2019-06-29 NOTE — Progress Notes (Signed)
06/29/2019 11:13 AM   Darren Howard 1950/07/20 [id number]  Referring provider: [name], MD [address] [place],  [place] [address]  Urinary retention  HPI: Mr [name] is a [phone] here for evaluation of urinary retention. He was admitted with sepsis on 4/17 and was found to have urinary retention. A foley was placed during his hospitalization and was started on flomax. Prior to admission the patient noted worsening abdominal distention and a weak urinary stream. He had nocturia 3-5x.  Intial CT from 4/17 showed bilateral hydronephrosis and a distended bladder. Repeat CT from 4/26 showed mild left hydronephrosis. Creatinine 0.8   PMH: Past Medical History:  Diagnosis Date  . Alcohol abuse   . Anemia   . Diabetes mellitus without complication ([place])   . Duodenal erosion   . Erosive gastropathy   . GI bleed 04/2018  . Glucose intolerance (impaired glucose tolerance)   . Hyperlipemia   . Hypertension   . Noncompliance     Surgical History: Past Surgical History:  Procedure Laterality Date  . BIOPSY  05/17/2018   Procedure: BIOPSY;  Surgeon: Daneil Dolin, MD;  [facility];  Service: Endoscopy;;  . BIOPSY  06/15/2019   Procedure: BIOPSY;  Surgeon: Daneil Dolin, MD;  [facility];  Service: Endoscopy;;  . CARDIAC CATHETERIZATION    . COLONOSCOPY N/A 06/15/2019   Procedure: COLONOSCOPY;  Surgeon: Daneil Dolin, MD;  [facility];  Service: Endoscopy;  Laterality: N/A;  . COLONOSCOPY WITH PROPOFOL N/A 06/01/2018   inadequate colon prep, hemorrhoids on perianal exam, two 4-6 mm polyps at hepatic flexure, one 18 mm polyp at IC valve s/p piecemeal removal (tubular adenomas). was to have surveillance colonoscopy in 6 months but did not return to the office  . ESOPHAGOGASTRODUODENOSCOPY (EGD) WITH PROPOFOL N/A 05/17/2018   Dr. Gala Romney: normal esophagus, erosive gastropathy s/p biopsy, duodenal erosions, felt to be NSAID effect  . HERNIA  REPAIR    . POLYPECTOMY  06/01/2018   Procedure: POLYPECTOMY;  Surgeon: Daneil Dolin, MD;  [facility];  Service: Endoscopy;;  colon    Home Medications:  Allergies as of 06/29/2019   No Known Allergies     Medication List       Accurate as of Jun 29, 2019 11:13 AM. If you have any questions, ask your nurse or doctor.        amLODipine 5 MG tablet Commonly known as: NORVASC Take 1 tablet (5 mg total) by mouth daily.   atorvastatin 10 MG tablet Commonly known as: LIPITOR Take 10 mg by mouth at bedtime.   calcium carbonate 500 MG chewable tablet Commonly known as: TUMS - dosed in mg elemental calcium Chew 2 tablets (400 mg of elemental calcium total) by mouth 2 (two) times daily with a meal.   carvedilol 25 MG tablet Commonly known as: COREG Take 25 mg by mouth 2 (two) times daily.   gabapentin 300 MG capsule Commonly known as: NEURONTIN Take 300 mg by mouth 3 (three) times daily.   hydrocortisone 2.5 % rectal cream Commonly known as: ANUSOL-HC Place rectally 3 (three) times daily. Apply to anorectum every 6 hours x 10 days   magnesium oxide 400 (241.3 Mg) MG tablet Commonly known as: MAG-OX Take 1 tablet (400 mg total) by mouth daily.   metFORMIN 500 MG 24 hr tablet Commonly known as: GLUCOPHAGE-XR Take 1 tablet (500 mg total) by mouth daily with breakfast.   multivitamin with minerals  Tabs tablet Take 1 tablet by mouth daily.   pantoprazole 40 MG tablet Commonly known as: PROTONIX Take 1 tablet (40 mg total) by mouth daily for 30 days.   potassium chloride SA 20 MEQ tablet Commonly known as: KLOR-CON Take 2 tablets (40 mEq total) by mouth daily.   saccharomyces boulardii 250 MG capsule Commonly known as: FLORASTOR Take 1 capsule (250 mg total) by mouth 2 (two) times daily.   tamsulosin 0.4 MG Caps capsule Commonly known as: FLOMAX Take 1 capsule (0.4 mg total) by mouth 2 (two) times daily.       Allergies: No Known  Allergies  Family History: Family History  Problem Relation Age of Onset  . Congestive Heart Failure Mother   . Congestive Heart Failure Father   . Cancer Brother        unknown kind   . Colon cancer Neg Hx   . Colon polyps Neg Hx     Social History:  reports that he has been smoking cigarettes. He has a 15.00 pack-year smoking history. He has never used smokeless tobacco. He reports current alcohol use of about 2.0 standard drinks of alcohol per week. He reports that he does not use drugs.  ROS: All other review of systems were reviewed and are negative except what is noted above in HPI  Physical Exam: BP 107/75   Pulse 94   Temp 97.7 F (36.5 C)   Ht 6\' 2"  (1.88 m)   Wt 205 lb (93 kg)   BMI 26.32 kg/m   Constitutional:  Alert and oriented, No acute distress. HEENT: [place] AT, moist mucus membranes.  Trachea midline, no masses. Cardiovascular: No clubbing, cyanosis, or edema. Respiratory: Normal respiratory effort, no increased work of breathing. GI: Abdomen is soft, nontender, nondistended, no abdominal masses GU: No CVA tenderness. Circumcised phallus. No masses/lesions on penis, testis, scrotum. Prostate 40g smooth no nodules no induration.  Lymph: No cervical or inguinal lymphadenopathy. Skin: No rashes, bruises or suspicious lesions. Neurologic: Grossly intact, no focal deficits, moving all 4 extremities. Psychiatric: Normal mood and affect.  Laboratory Data: Lab Results  Component Value Date   WBC 7.1 06/19/2019   HGB 7.6 (L) 06/19/2019   HCT 24.3 (L) 06/19/2019   MCV 91.7 06/19/2019   PLT 352 06/19/2019    Lab Results  Component Value Date   CREATININE 0.88 06/20/2019    Lab Results  Component Value Date   PSA 2.18 Test Methodology: [employer] PSA 07/19/2006    No results found for: TESTOSTERONE  Lab Results  Component Value Date   HGBA1C 6.9 (H) 05/13/2018    Urinalysis    Component Value Date/Time   COLORURINE YELLOW 06/05/2019 0031    APPEARANCEUR CLEAR 06/05/2019 0031   LABSPEC 1.017 06/05/2019 0031   PHURINE 5.0 06/05/2019 0031   GLUCOSEU NEGATIVE 06/05/2019 0031   HGBUR NEGATIVE 06/05/2019 0031   BILIRUBINUR NEGATIVE 06/05/2019 0031   KETONESUR NEGATIVE 06/05/2019 0031   PROTEINUR NEGATIVE 06/05/2019 0031   UROBILINOGEN 0.2 06/14/2008 1930   NITRITE NEGATIVE 06/05/2019 0031   LEUKOCYTESUR NEGATIVE 06/05/2019 0031    Lab Results  Component Value Date   BACTERIA RARE (A) 05/13/2018    Pertinent Imaging: CT 4/17 and 4/26: Images reviewed and discussed with the patient No results found for this or any previous visit. Results for orders placed during the hospital encounter of 06/04/19  US Venous Img Lower Bilateral (DVT)   Narrative CLINICAL DATA:  Bilateral lower extremity pain and edema for 1 month.  EXAM: BILATERAL LOWER EXTREMITY VENOUS DOPPLER ULTRASOUND  TECHNIQUE: Gray-scale sonography with compression, as well as color and duplex ultrasound, were performed to evaluate the deep venous system(s) from the level of the common femoral vein through the popliteal and proximal calf veins.  COMPARISON:  None.  FINDINGS: VENOUS  Normal compressibility of the common femoral, superficial femoral, and popliteal veins, as well as the visualized calf veins. Visualized portions of profunda femoral vein and great saphenous vein unremarkable. No filling defects to suggest DVT on grayscale or color Doppler imaging. Doppler waveforms show normal direction of venous flow, normal respiratory phasicity and response to augmentation.  OTHER  None.  Limitations: none  IMPRESSION: No femoropopliteal DVT nor evidence of DVT within the visualized calf veins.  If clinical symptoms are inconsistent or if there are persistent or worsening symptoms, further imaging (possibly involving the iliac veins) may be warranted.   Electronically Signed   By: Abigail Miyamoto M.D.   On: 06/05/2019 11:44    No results  found for this or any previous visit. No results found for this or any previous visit. No results found for this or any previous visit. No results found for this or any previous visit. No results found for this or any previous visit. No results found for this or any previous visit.  Assessment & Plan:    1. Urinary retention -voiding trial passed today. RTC 4-6 weeks with PVR   Return in about 6 weeks (around 08/10/2019).  Nicolette Bang, MD  [facility]

## 2019-06-29 NOTE — Progress Notes (Signed)
Fill and Pull Catheter Removal  Patient is present today for a catheter removal.  Patient was cleaned and prepped in a sterile fashion 181ml of sterile water/ saline was instilled into the bladder when the patient felt the urge to urinate. 47ml of water was then drained from the balloon.  A 16FR foley cath was removed from the bladder   79 ml on their own after some time.  Patient tolerated well. Dr. Alyson Ingles said for pt. To come back this afternoon for a PVR because his peeing attempt was fair.  Performed by: Armed forces operational officer, Anessa Charley lpn

## 2019-06-29 NOTE — Progress Notes (Signed)
Urological Symptom Review  Patient is experiencing the following symptoms: Stream starts and stops Trouble starting stream Have to strain to urinate Weak stream   Review of Systems  Gastrointestinal (upper)  : Negative for upper GI symptoms  Gastrointestinal (lower) : Diarrhea  Constitutional : Negative for symptoms  Skin: Negative for skin symptoms  Eyes: Negative for eye symptoms  Ear/Nose/Throat : Negative for Ear/Nose/Throat symptoms  Hematologic/Lymphatic: Negative for Hematologic/Lymphatic symptoms  Cardiovascular : Negative for cardiovascular symptoms  Respiratory : Negative for respiratory symptoms  Endocrine: Negative for endocrine symptoms  Musculoskeletal: Negative for musculoskeletal symptoms  Neurological: Negative for neurological symptoms  Psychologic: Negative for psychiatric symptoms

## 2019-07-06 DIAGNOSIS — E663 Overweight: Secondary | ICD-10-CM | POA: Diagnosis not present

## 2019-07-06 DIAGNOSIS — Z6826 Body mass index (BMI) 26.0-26.9, adult: Secondary | ICD-10-CM | POA: Diagnosis not present

## 2019-07-06 DIAGNOSIS — K529 Noninfective gastroenteritis and colitis, unspecified: Secondary | ICD-10-CM | POA: Diagnosis not present

## 2019-07-06 DIAGNOSIS — E119 Type 2 diabetes mellitus without complications: Secondary | ICD-10-CM | POA: Diagnosis not present

## 2019-07-21 ENCOUNTER — Ambulatory Visit: Payer: PPO | Admitting: Nurse Practitioner

## 2019-07-27 DIAGNOSIS — E876 Hypokalemia: Secondary | ICD-10-CM | POA: Diagnosis not present

## 2019-07-27 DIAGNOSIS — E1122 Type 2 diabetes mellitus with diabetic chronic kidney disease: Secondary | ICD-10-CM | POA: Diagnosis not present

## 2019-07-27 DIAGNOSIS — D500 Iron deficiency anemia secondary to blood loss (chronic): Secondary | ICD-10-CM | POA: Diagnosis not present

## 2019-07-27 DIAGNOSIS — K5289 Other specified noninfective gastroenteritis and colitis: Secondary | ICD-10-CM | POA: Diagnosis not present

## 2019-08-11 ENCOUNTER — Encounter: Payer: Self-pay | Admitting: *Deleted

## 2019-08-11 DIAGNOSIS — E785 Hyperlipidemia, unspecified: Secondary | ICD-10-CM | POA: Insufficient documentation

## 2019-08-11 NOTE — Patient Outreach (Signed)
[facility]) Care Management  08/11/2019  OREE HISLOP 1950-07-16 [id number]  Telephone outreach to provider office. Informed receptionist, NP would like to leave a message for Dr. Hilma Favors. Informed Dr. Hilma Favors is not in the office this week and the message will be given to the covering MD.  Provided message that pt medication refill hx does not indicate that he is taking his medications as directed, for this information. Also that prior dx of psoriasis has not been captured for coding to date this year.  No need to return NP call.  Eulah Pont. Myrtie Neither, MSN, [facility] Gerontological Nurse Practitioner [facility] Care Management [phone]

## 2019-08-15 ENCOUNTER — Ambulatory Visit: Payer: PPO | Admitting: Urology

## 2019-08-17 ENCOUNTER — Ambulatory Visit: Payer: PPO | Admitting: Urology

## 2019-09-21 ENCOUNTER — Ambulatory Visit: Payer: PPO | Admitting: Nurse Practitioner

## 2019-09-21 NOTE — Progress Notes (Deleted)
Referring Provider: [name], MD Primary Care Physician:  [name], MD Primary GI:  Dr. Gala Romney  No chief complaint on file.   HPI:   Darren Howard is a 69 y.o. male who presents for hospital follow-up.  The patient was admitted to the hospital from 06/04/2019 through 06/20/2019 for acute colitis.  Noted history of alcohol abuse, chronic normocytic anemia, noncompliance, erosive gastropathy, GI bleed.  He presented with weakness, diarrhea, left lower quadrant pain.  Initially told ER staff he is not taking his home medications because he is "too lazy."  Noted 2 shots of liquor a day as well.  Hemoglobin found to be 7.6 on admission.  CT of the abdomen and pelvis for the distended urinary bladder and mild bilateral hydronephrosis and right-sided pyelonephritis.  Also noted thickening of the rectosigmoid colon concerning for possible stercoral colitis.  Due to pancolitis and diarrhea likely deemed multifactorial, GI and ID were consulted and recommended 10 days of oral vancomycin.  Also recommended probiotic.  Colonoscopy as outlined below.  Repeat of the CT scan 06/13/2019 found progression of pancolitis.  By his last in the hospital his stools are noted to be mushy but "feels like you are trying to become solid."  GI symptoms resolved by that time.  Recommended complete 10-day course of vancomycin, follow-up for fecal fat results, absolute alcohol avoidance, GI follow-up as an outpatient in 4 weeks.  Colonoscopy completed 06/15/2019 with a poor prep and inadequate for polyp or small lesion detection with mucosa of the colon diffusely edematous but no erosions, ulcerations, pseudomembranes.  Sigmoid colon appeared to be thickened with a perpendicularly ringed appearance, pale, and appeared to have grade paper/cobblestone appearance.  Multiple biopsies taken and negative for pathological findings.  Likely diarrhea multifactorial in nature including large doses of oral potassium, multiple  antibiotics, alcohol withdrawal.  Also for possible malabsorption given hypoalbuminemia.  Stool for fecal fat test resulted to be normal.  Today he states   Past Medical History:  Diagnosis Date  . Alcohol abuse   . Anemia   . Diabetes mellitus without complication ([place])   . Duodenal erosion   . Erosive gastropathy   . GI bleed 04/2018  . Glucose intolerance (impaired glucose tolerance)   . Hyperlipemia   . Hypertension   . Noncompliance     Past Surgical History:  Procedure Laterality Date  . BIOPSY  05/17/2018   Procedure: BIOPSY;  Surgeon: Daneil Dolin, MD;  [facility];  Service: Endoscopy;;  . BIOPSY  06/15/2019   Procedure: BIOPSY;  Surgeon: Daneil Dolin, MD;  [facility];  Service: Endoscopy;;  . CARDIAC CATHETERIZATION    . COLONOSCOPY N/A 06/15/2019   Procedure: COLONOSCOPY;  Surgeon: Daneil Dolin, MD;  [facility];  Service: Endoscopy;  Laterality: N/A;  . COLONOSCOPY WITH PROPOFOL N/A 06/01/2018   inadequate colon prep, hemorrhoids on perianal exam, two 4-6 mm polyps at hepatic flexure, one 18 mm polyp at IC valve s/p piecemeal removal (tubular adenomas). was to have surveillance colonoscopy in 6 months but did not return to the office  . ESOPHAGOGASTRODUODENOSCOPY (EGD) WITH PROPOFOL N/A 05/17/2018   Dr. Gala Romney: normal esophagus, erosive gastropathy s/p biopsy, duodenal erosions, felt to be NSAID effect  . HERNIA REPAIR    . POLYPECTOMY  06/01/2018   Procedure: POLYPECTOMY;  Surgeon: Daneil Dolin, MD;  [facility];  Service: Endoscopy;;  colon    Current Outpatient Medications  Medication  Sig Dispense Refill  . amLODipine (NORVASC) 5 MG tablet Take 1 tablet (5 mg total) by mouth daily. 30 tablet 1  . atorvastatin (LIPITOR) 10 MG tablet Take 10 mg by mouth at bedtime.    . calcium carbonate (TUMS - DOSED IN MG ELEMENTAL CALCIUM) 500 MG chewable tablet Chew 2 tablets (400 mg of elemental calcium total) by mouth 2  (two) times daily with a meal. 120 tablet 1  . carvedilol (COREG) 25 MG tablet Take 25 mg by mouth 2 (two) times daily.    Marland Kitchen gabapentin (NEURONTIN) 300 MG capsule Take 300 mg by mouth 3 (three) times daily.    . hydrocortisone (ANUSOL-HC) 2.5 % rectal cream Place rectally 3 (three) times daily. Apply to anorectum every 6 hours x 10 days 30 g 0  . magnesium oxide (MAG-OX) 400 (241.3 Mg) MG tablet Take 1 tablet (400 mg total) by mouth daily. 30 tablet 1  . metFORMIN (GLUCOPHAGE-XR) 500 MG 24 hr tablet Take 1 tablet (500 mg total) by mouth daily with breakfast.    . Multiple Vitamin (MULTIVITAMIN WITH MINERALS) TABS tablet Take 1 tablet by mouth daily. 30 tablet 2  . pantoprazole (PROTONIX) 40 MG tablet Take 1 tablet (40 mg total) by mouth daily for 30 days. 30 tablet 3  . potassium chloride SA (KLOR-CON) 20 MEQ tablet Take 2 tablets (40 mEq total) by mouth daily. 30 tablet 1  . saccharomyces boulardii (FLORASTOR) 250 MG capsule Take 1 capsule (250 mg total) by mouth 2 (two) times daily. 20 capsule 0  . tamsulosin (FLOMAX) 0.4 MG CAPS capsule Take 1 capsule (0.4 mg total) by mouth 2 (two) times daily. 60 capsule 11   No current facility-administered medications for this visit.    Allergies as of 09/21/2019  . (No Known Allergies)    Family History  Problem Relation Age of Onset  . Congestive Heart Failure Mother   . Congestive Heart Failure Father   . Cancer Brother        unknown kind   . Colon cancer Neg Hx   . Colon polyps Neg Hx     Social History   Socioeconomic History  . Marital status: Widowed    Spouse name: Not on file  . Number of children: 4  . Years of education: Not on file  . Highest education level: Not on file  Occupational History  . Occupation: retired    Comment: [employer]  Tobacco Use  . Smoking status: Current Every Day Smoker    Packs/day: 0.50    Years: 30.00    Pack years: 15.00    Types: Cigarettes  . Smokeless tobacco: Never Used  Vaping Use  .  Vaping Use: Never used  Substance and Sexual Activity  . Alcohol use: Yes    Alcohol/week: 2.0 standard drinks    Types: 2 Shots of liquor per week  . Drug use: No  . Sexual activity: Not Currently  Other Topics Concern  . Not on file  Social History Narrative  . Not on file   Social Determinants of Health   Financial Resource Strain:   . Difficulty of Paying Living Expenses:   Food Insecurity:   . Worried About Charity fundraiser in the Last Year:   . Arboriculturist in the Last Year:   Transportation Needs:   . Film/video editor (Medical):   Marland Kitchen Lack of Transportation (Non-Medical):   Physical Activity:   . Days of Exercise per Week:   .  Minutes of Exercise per Session:   Stress:   . Feeling of Stress :   Social Connections:   . Frequency of Communication with Friends and Family:   . Frequency of Social Gatherings with Friends and Family:   . Attends Religious Services:   . Active Member of Clubs or Organizations:   . Attends Archivist Meetings:   Marland Kitchen Marital Status:     Subjective: Review of Systems  Constitutional: Negative for chills, fever, malaise/fatigue and weight loss.  HENT: Negative for congestion and sore throat.   Respiratory: Negative for cough and shortness of breath.   Cardiovascular: Negative for chest pain and palpitations.  Gastrointestinal: Negative for abdominal pain, blood in stool, diarrhea, melena, nausea and vomiting.  Musculoskeletal: Negative for joint pain and myalgias.  Skin: Negative for rash.  Neurological: Negative for dizziness and weakness.  Endo/Heme/Allergies: Does not bruise/bleed easily.  Psychiatric/Behavioral: Negative for depression. The patient is not nervous/anxious.   All other systems reviewed and are negative.    Objective: There were no vitals taken for this visit. Physical Exam Vitals and nursing note reviewed.  Constitutional:      General: He is not in acute distress.    Appearance: Normal  appearance. He is not ill-appearing, toxic-appearing or diaphoretic.  HENT:     Head: Normocephalic and atraumatic.     Nose: No congestion or rhinorrhea.  Eyes:     General: No scleral icterus. Cardiovascular:     Rate and Rhythm: Normal rate and regular rhythm.     Heart sounds: Normal heart sounds.  Pulmonary:     Effort: Pulmonary effort is normal.     Breath sounds: Normal breath sounds.  Abdominal:     General: Bowel sounds are normal. There is no distension.     Palpations: Abdomen is soft. There is no hepatomegaly, splenomegaly or mass.     Tenderness: There is no abdominal tenderness. There is no guarding or rebound.     Hernia: No hernia is present.  Musculoskeletal:     Cervical back: Neck supple.  Skin:    General: Skin is warm and dry.     Coloration: Skin is not jaundiced.     Findings: No bruising or rash.  Neurological:     General: No focal deficit present.     Mental Status: He is alert and oriented to person, place, and time. Mental status is at baseline.  Psychiatric:        Mood and Affect: Mood normal.        Behavior: Behavior normal.        Thought Content: Thought content normal.       09/21/2019 7:48 AM   Disclaimer: This note was dictated with voice recognition software. Similar sounding words can inadvertently be transcribed and may not be corrected upon review.

## 2019-12-20 DIAGNOSIS — I739 Peripheral vascular disease, unspecified: Secondary | ICD-10-CM | POA: Diagnosis not present

## 2019-12-20 DIAGNOSIS — M25579 Pain in unspecified ankle and joints of unspecified foot: Secondary | ICD-10-CM | POA: Diagnosis not present

## 2019-12-20 DIAGNOSIS — M79675 Pain in left toe(s): Secondary | ICD-10-CM | POA: Diagnosis not present

## 2019-12-20 DIAGNOSIS — M79674 Pain in right toe(s): Secondary | ICD-10-CM | POA: Diagnosis not present

## 2019-12-20 DIAGNOSIS — M79671 Pain in right foot: Secondary | ICD-10-CM | POA: Diagnosis not present

## 2019-12-20 DIAGNOSIS — M79672 Pain in left foot: Secondary | ICD-10-CM | POA: Diagnosis not present

## 2020-01-18 ENCOUNTER — Emergency Department (HOSPITAL_COMMUNITY): Payer: PPO

## 2020-01-18 ENCOUNTER — Inpatient Hospital Stay (HOSPITAL_COMMUNITY)
Admission: EM | Admit: 2020-01-18 | Discharge: 2020-01-26 | DRG: 682 | Disposition: A | Discharge status: Home Health | Payer: PPO | Attending: Internal Medicine | Admitting: Internal Medicine

## 2020-01-18 ENCOUNTER — Encounter (HOSPITAL_COMMUNITY): Payer: Self-pay | Admitting: Emergency Medicine

## 2020-01-18 ENCOUNTER — Other Ambulatory Visit: Payer: Self-pay

## 2020-01-18 DIAGNOSIS — K767 Hepatorenal syndrome: Secondary | ICD-10-CM | POA: Diagnosis present

## 2020-01-18 DIAGNOSIS — R531 Weakness: Secondary | ICD-10-CM | POA: Diagnosis not present

## 2020-01-18 DIAGNOSIS — K649 Unspecified hemorrhoids: Secondary | ICD-10-CM

## 2020-01-18 DIAGNOSIS — R2681 Unsteadiness on feet: Secondary | ICD-10-CM | POA: Diagnosis present

## 2020-01-18 DIAGNOSIS — K625 Hemorrhage of anus and rectum: Secondary | ICD-10-CM | POA: Diagnosis present

## 2020-01-18 DIAGNOSIS — D509 Iron deficiency anemia, unspecified: Secondary | ICD-10-CM | POA: Diagnosis present

## 2020-01-18 DIAGNOSIS — Z6823 Body mass index (BMI) 23.0-23.9, adult: Secondary | ICD-10-CM | POA: Diagnosis not present

## 2020-01-18 DIAGNOSIS — R9431 Abnormal electrocardiogram [ECG] [EKG]: Secondary | ICD-10-CM | POA: Diagnosis present

## 2020-01-18 DIAGNOSIS — K449 Diaphragmatic hernia without obstruction or gangrene: Secondary | ICD-10-CM | POA: Diagnosis present

## 2020-01-18 DIAGNOSIS — G629 Polyneuropathy, unspecified: Secondary | ICD-10-CM | POA: Diagnosis not present

## 2020-01-18 DIAGNOSIS — D62 Acute posthemorrhagic anemia: Secondary | ICD-10-CM | POA: Diagnosis present

## 2020-01-18 DIAGNOSIS — Z720 Tobacco use: Secondary | ICD-10-CM | POA: Diagnosis present

## 2020-01-18 DIAGNOSIS — R42 Dizziness and giddiness: Secondary | ICD-10-CM | POA: Diagnosis present

## 2020-01-18 DIAGNOSIS — E1122 Type 2 diabetes mellitus with diabetic chronic kidney disease: Secondary | ICD-10-CM | POA: Diagnosis not present

## 2020-01-18 DIAGNOSIS — I8510 Secondary esophageal varices without bleeding: Secondary | ICD-10-CM | POA: Diagnosis present

## 2020-01-18 DIAGNOSIS — F1010 Alcohol abuse, uncomplicated: Secondary | ICD-10-CM | POA: Diagnosis present

## 2020-01-18 DIAGNOSIS — N179 Acute kidney failure, unspecified: Principal | ICD-10-CM | POA: Diagnosis present

## 2020-01-18 DIAGNOSIS — Z20822 Contact with and (suspected) exposure to covid-19: Secondary | ICD-10-CM | POA: Diagnosis present

## 2020-01-18 DIAGNOSIS — R000 Tachycardia, unspecified: Secondary | ICD-10-CM | POA: Diagnosis not present

## 2020-01-18 DIAGNOSIS — R21 Rash and other nonspecific skin eruption: Secondary | ICD-10-CM | POA: Diagnosis not present

## 2020-01-18 DIAGNOSIS — I10 Essential (primary) hypertension: Secondary | ICD-10-CM | POA: Diagnosis present

## 2020-01-18 DIAGNOSIS — K7031 Alcoholic cirrhosis of liver with ascites: Secondary | ICD-10-CM

## 2020-01-18 DIAGNOSIS — K922 Gastrointestinal hemorrhage, unspecified: Secondary | ICD-10-CM | POA: Diagnosis not present

## 2020-01-18 DIAGNOSIS — N189 Chronic kidney disease, unspecified: Secondary | ICD-10-CM | POA: Diagnosis not present

## 2020-01-18 DIAGNOSIS — I129 Hypertensive chronic kidney disease with stage 1 through stage 4 chronic kidney disease, or unspecified chronic kidney disease: Secondary | ICD-10-CM | POA: Diagnosis not present

## 2020-01-18 DIAGNOSIS — J449 Chronic obstructive pulmonary disease, unspecified: Secondary | ICD-10-CM | POA: Diagnosis present

## 2020-01-18 DIAGNOSIS — I700 Atherosclerosis of aorta: Secondary | ICD-10-CM | POA: Diagnosis present

## 2020-01-18 DIAGNOSIS — R64 Cachexia: Secondary | ICD-10-CM | POA: Diagnosis present

## 2020-01-18 DIAGNOSIS — Z809 Family history of malignant neoplasm, unspecified: Secondary | ICD-10-CM

## 2020-01-18 DIAGNOSIS — K7460 Unspecified cirrhosis of liver: Secondary | ICD-10-CM | POA: Diagnosis not present

## 2020-01-18 DIAGNOSIS — K4090 Unilateral inguinal hernia, without obstruction or gangrene, not specified as recurrent: Secondary | ICD-10-CM | POA: Diagnosis present

## 2020-01-18 DIAGNOSIS — Z9114 Patient's other noncompliance with medication regimen: Secondary | ICD-10-CM

## 2020-01-18 DIAGNOSIS — K2100 Gastro-esophageal reflux disease with esophagitis, without bleeding: Secondary | ICD-10-CM | POA: Diagnosis not present

## 2020-01-18 DIAGNOSIS — F17210 Nicotine dependence, cigarettes, uncomplicated: Secondary | ICD-10-CM | POA: Diagnosis present

## 2020-01-18 DIAGNOSIS — Z7141 Alcohol abuse counseling and surveillance of alcoholic: Secondary | ICD-10-CM

## 2020-01-18 DIAGNOSIS — D696 Thrombocytopenia, unspecified: Secondary | ICD-10-CM | POA: Diagnosis present

## 2020-01-18 DIAGNOSIS — R188 Other ascites: Secondary | ICD-10-CM | POA: Diagnosis not present

## 2020-01-18 DIAGNOSIS — E785 Hyperlipidemia, unspecified: Secondary | ICD-10-CM | POA: Diagnosis present

## 2020-01-18 DIAGNOSIS — K529 Noninfective gastroenteritis and colitis, unspecified: Secondary | ICD-10-CM | POA: Diagnosis present

## 2020-01-18 DIAGNOSIS — K2980 Duodenitis without bleeding: Secondary | ICD-10-CM | POA: Diagnosis not present

## 2020-01-18 DIAGNOSIS — R791 Abnormal coagulation profile: Secondary | ICD-10-CM | POA: Diagnosis present

## 2020-01-18 DIAGNOSIS — J90 Pleural effusion, not elsewhere classified: Secondary | ICD-10-CM | POA: Diagnosis not present

## 2020-01-18 DIAGNOSIS — D649 Anemia, unspecified: Secondary | ICD-10-CM | POA: Diagnosis present

## 2020-01-18 DIAGNOSIS — E119 Type 2 diabetes mellitus without complications: Secondary | ICD-10-CM | POA: Diagnosis present

## 2020-01-18 DIAGNOSIS — K921 Melena: Secondary | ICD-10-CM | POA: Diagnosis not present

## 2020-01-18 DIAGNOSIS — K759 Inflammatory liver disease, unspecified: Secondary | ICD-10-CM | POA: Diagnosis present

## 2020-01-18 DIAGNOSIS — Z9119 Patient's noncompliance with other medical treatment and regimen: Secondary | ICD-10-CM

## 2020-01-18 DIAGNOSIS — Z8711 Personal history of peptic ulcer disease: Secondary | ICD-10-CM

## 2020-01-18 DIAGNOSIS — K648 Other hemorrhoids: Secondary | ICD-10-CM | POA: Diagnosis present

## 2020-01-18 DIAGNOSIS — K317 Polyp of stomach and duodenum: Secondary | ICD-10-CM | POA: Diagnosis present

## 2020-01-18 DIAGNOSIS — K644 Residual hemorrhoidal skin tags: Secondary | ICD-10-CM | POA: Diagnosis present

## 2020-01-18 DIAGNOSIS — Z8249 Family history of ischemic heart disease and other diseases of the circulatory system: Secondary | ICD-10-CM

## 2020-01-18 DIAGNOSIS — K760 Fatty (change of) liver, not elsewhere classified: Secondary | ICD-10-CM | POA: Diagnosis present

## 2020-01-18 DIAGNOSIS — E876 Hypokalemia: Secondary | ICD-10-CM | POA: Diagnosis not present

## 2020-01-18 DIAGNOSIS — Z7984 Long term (current) use of oral hypoglycemic drugs: Secondary | ICD-10-CM

## 2020-01-18 DIAGNOSIS — K209 Esophagitis, unspecified without bleeding: Secondary | ICD-10-CM | POA: Diagnosis present

## 2020-01-18 DIAGNOSIS — R140 Abdominal distension (gaseous): Secondary | ICD-10-CM | POA: Diagnosis not present

## 2020-01-18 DIAGNOSIS — N182 Chronic kidney disease, stage 2 (mild): Secondary | ICD-10-CM | POA: Diagnosis not present

## 2020-01-18 DIAGNOSIS — Z79899 Other long term (current) drug therapy: Secondary | ICD-10-CM

## 2020-01-18 DIAGNOSIS — K3189 Other diseases of stomach and duodenum: Secondary | ICD-10-CM | POA: Diagnosis not present

## 2020-01-18 LAB — URINALYSIS, ROUTINE W REFLEX MICROSCOPIC
Bilirubin Urine: NEGATIVE
Glucose, UA: NEGATIVE mg/dL
Ketones, ur: NEGATIVE mg/dL
Nitrite: NEGATIVE
Protein, ur: 30 mg/dL — AB
Specific Gravity, Urine: 1.038 — ABNORMAL HIGH (ref 1.005–1.030)
WBC, UA: >50 WBC/hpf — ABNORMAL HIGH (ref 0–5)
pH: 5 (ref 5.0–8.0)

## 2020-01-18 LAB — CBC
HCT: 30.7 % — ABNORMAL LOW (ref 39.0–52.0)
Hemoglobin: 10.3 g/dL — ABNORMAL LOW (ref 13.0–17.0)
MCH: 32.7 pg (ref 26.0–34.0)
MCHC: 33.6 g/dL (ref 30.0–36.0)
MCV: 97.5 fL (ref 80.0–100.0)
Platelets: 226 10*3/uL (ref 150–400)
RBC: 3.15 MIL/uL — ABNORMAL LOW (ref 4.22–5.81)
RDW: 15.4 % (ref 11.5–15.5)
WBC: 9.2 10*3/uL (ref 4.0–10.5)
nRBC: 0 % (ref 0.0–0.2)

## 2020-01-18 LAB — HEPATIC FUNCTION PANEL
ALT: 28 U/L (ref 0–44)
AST: 72 U/L — ABNORMAL HIGH (ref 15–41)
Albumin: 2.3 g/dL — ABNORMAL LOW (ref 3.5–5.0)
Alkaline Phosphatase: 125 U/L (ref 38–126)
Bilirubin, Direct: 1.7 mg/dL — ABNORMAL HIGH (ref 0.0–0.2)
Indirect Bilirubin: 1.6 mg/dL — ABNORMAL HIGH (ref 0.3–0.9)
Total Bilirubin: 3.3 mg/dL — ABNORMAL HIGH (ref 0.3–1.2)
Total Protein: 6.6 g/dL (ref 6.5–8.1)

## 2020-01-18 LAB — BASIC METABOLIC PANEL
Anion gap: 10 (ref 5–15)
BUN: 27 mg/dL — ABNORMAL HIGH (ref 8–23)
CO2: 18 mmol/L — ABNORMAL LOW (ref 22–32)
Calcium: 7.8 mg/dL — ABNORMAL LOW (ref 8.9–10.3)
Chloride: 106 mmol/L (ref 98–111)
Creatinine, Ser: 2 mg/dL — ABNORMAL HIGH (ref 0.61–1.24)
GFR, Estimated: 36 mL/min — ABNORMAL LOW (ref >60)
Glucose, Bld: 158 mg/dL — ABNORMAL HIGH (ref 70–99)
Potassium: 4 mmol/L (ref 3.5–5.1)
Sodium: 134 mmol/L — ABNORMAL LOW (ref 135–145)

## 2020-01-18 LAB — CBG MONITORING, ED
Glucose-Capillary: 139 mg/dL — ABNORMAL HIGH (ref 70–99)
Glucose-Capillary: 127 mg/dL — ABNORMAL HIGH (ref 70–99)

## 2020-01-18 LAB — RESP PANEL BY RT-PCR (FLU A&B, COVID) ARPGX2
Influenza A by PCR: NEGATIVE
Influenza B by PCR: NEGATIVE
SARS Coronavirus 2 by RT PCR: NEGATIVE

## 2020-01-18 LAB — TROPONIN I (HIGH SENSITIVITY)
Troponin I (High Sensitivity): 11 ng/L (ref <18)
Troponin I (High Sensitivity): 2 ng/L (ref <18)

## 2020-01-18 LAB — PROTIME-INR
INR: 1.7 — ABNORMAL HIGH (ref 0.8–1.2)
Prothrombin Time: 19.7 seconds — ABNORMAL HIGH (ref 11.4–15.2)

## 2020-01-18 LAB — LIPASE, BLOOD: Lipase: 13 U/L (ref 11–51)

## 2020-01-18 MED ORDER — OCTREOTIDE ACETATE 500 MCG/ML IJ SOLN
INTRAMUSCULAR | Status: AC
  Filled 2020-01-18: qty 1

## 2020-01-18 MED ORDER — SODIUM CHLORIDE 0.9 % IV SOLN
50.0000 ug/h | INTRAVENOUS | Status: DC
  Administered 2020-01-19 – 2020-01-20 (×4): 50 ug/h via INTRAVENOUS
  Filled 2020-01-18 (×13): qty 1

## 2020-01-18 MED ORDER — PANTOPRAZOLE SODIUM 40 MG PO TBEC
40.0000 mg | DELAYED_RELEASE_TABLET | Freq: Every day | ORAL | Status: DC
  Administered 2020-01-18 – 2020-01-19 (×2): 40 mg via ORAL
  Filled 2020-01-18 (×2): qty 1

## 2020-01-18 MED ORDER — ALBUMIN HUMAN 25 % IV SOLN
50.0000 g | Freq: Four times a day (QID) | INTRAVENOUS | Status: AC
  Administered 2020-01-19 (×4): 50 g via INTRAVENOUS
  Filled 2020-01-18 (×5): qty 200

## 2020-01-18 MED ORDER — IOHEXOL 300 MG/ML  SOLN
75.0000 mL | Freq: Once | INTRAMUSCULAR | Status: AC | PRN
  Administered 2020-01-18: 75 mL via INTRAVENOUS

## 2020-01-18 MED ORDER — SODIUM CHLORIDE 0.9 % IV BOLUS
1000.0000 mL | Freq: Once | INTRAVENOUS | Status: AC
  Administered 2020-01-18: 1000 mL via INTRAVENOUS

## 2020-01-18 MED ORDER — MAGNESIUM SULFATE 2 GM/50ML IV SOLN
2.0000 g | Freq: Once | INTRAVENOUS | Status: AC
  Administered 2020-01-18: 2 g via INTRAVENOUS
  Filled 2020-01-18: qty 50

## 2020-01-18 MED ORDER — ALBUMIN HUMAN 5 % IV SOLN: 25.0000 g | Freq: Four times a day (QID) | INTRAVENOUS | Status: DC

## 2020-01-18 MED ORDER — ALBUMIN HUMAN 25 % IV SOLN
50.0000 g | Freq: Four times a day (QID) | INTRAVENOUS | Status: DC
  Administered 2020-01-19: 50 g via INTRAVENOUS

## 2020-01-18 MED ORDER — SODIUM CHLORIDE 0.9 % IV SOLN
2.0000 g | INTRAVENOUS | Status: DC
  Administered 2020-01-19 – 2020-01-21 (×4): 2 g via INTRAVENOUS
  Filled 2020-01-18 (×4): qty 20

## 2020-01-18 MED ORDER — MIDODRINE HCL 5 MG PO TABS
7.5000 mg | ORAL_TABLET | Freq: Three times a day (TID) | ORAL | Status: DC
  Filled 2020-01-18 (×5): qty 1

## 2020-01-18 MED ORDER — PROCHLORPERAZINE EDISYLATE 10 MG/2ML IJ SOLN: 5.0000 mg | INTRAMUSCULAR | Status: DC | PRN

## 2020-01-18 MED ORDER — NICOTINE 7 MG/24HR TD PT24
7.0000 mg | MEDICATED_PATCH | Freq: Every day | TRANSDERMAL | Status: DC
  Administered 2020-01-19 (×2): 7 mg via TRANSDERMAL
  Filled 2020-01-18 (×4): qty 1

## 2020-01-18 MED ORDER — PENTOXIFYLLINE ER 400 MG PO TBCR
400.0000 mg | EXTENDED_RELEASE_TABLET | Freq: Three times a day (TID) | ORAL | Status: DC
  Administered 2020-01-19 – 2020-01-22 (×7): 400 mg via ORAL
  Filled 2020-01-18 (×9): qty 1

## 2020-01-18 NOTE — ED Triage Notes (Signed)
Pt c/o weakness for the past few months. Pt is brought to the ED by his son from [facility] with Dr. Armandina Gemma for evaluation.

## 2020-01-18 NOTE — ED Provider Notes (Signed)
[facility] EMERGENCY DEPARTMENT Provider Note   CSN: [id number] Arrival date & time: 01/18/20  1519     History Chief Complaint  Patient presents with  . Weakness    Darren Howard is a 69 y.o. male with medical history significant ofalcohol abuse, chronic normocytic anemia, medication and treatment noncompliance, history of duodenal reduction, erosive gastropathy, GI bleed, glucose intolerance/type 2 diabetes, hyperlipidemia, hypertension who was sent in from his PCPs office today for further evaluation.  Patient presented to the office after weeks of weight loss and abdominal distention.  He states that he is weak and short of breath all the time.  He has had significant weight loss.  He has very poor appetite.  He denies fevers or soaking night sweats but has worsening tight abdominal distention, chronic diarrhea and frequent urination.  He has not seen his PCP in some months and after evaluation he was encouraged to come here.    HPI     Past Medical History:  Diagnosis Date  . Alcohol abuse   . Anemia   . Diabetes mellitus without complication ([place])   . Duodenal erosion   . Erosive gastropathy   . GI bleed 04/2018  . Glucose intolerance (impaired glucose tolerance)   . Hyperlipemia   . Hypertension   . Noncompliance     Patient Active Problem List   Diagnosis Date Noted  . HLD (hyperlipidemia) 08/11/2019  . Abdominal pain   . Colitis, acute---??? infectious pancolitis 06/13/2019  . Urinary retention   . Diarrhea   . Abnormal CT scan, colon   . Pyelonephritis of right kidney 06/05/2019  . Pyelonephritis 06/05/2019  . Alcohol abuse   . Tobacco use   . Hematochezia 05/30/2018  . Hypokalemia 05/27/2018  . Hypomagnesemia 05/27/2018  . Hypocalcemia 05/27/2018  . Dizziness 05/13/2018  . Epistaxis 02/13/2014  . Hypertension 02/13/2014  . Normocytic anemia 02/13/2014  . DM2 (diabetes mellitus, type 2) ([place]) 02/13/2014  . Tachycardia 02/13/2014  . Hypertensive  urgency     Past Surgical History:  Procedure Laterality Date  . BIOPSY  05/17/2018   Procedure: BIOPSY;  Surgeon: Daneil Dolin, MD;  Location: [facility] ENDO SU[facility];  Service: Endoscopy;;  . BIOPSY  06/15/2019   Procedure: BIOPSY;  Surgeon: Daneil Dolin, MD;  Location: [facility] END[facility];  Service: Endoscopy;;  . CARDIAC CATHETERIZATION    . COLONOSCOPY N/A 06/15/2019   Procedure: COLONOSCOPY;  Surgeon: Daneil Dolin, MD;  Location: [facility] END[facility];  Service: Endoscopy;  Laterality: N/A;  . COLONOSCOPY WITH PROPOFOL N/A 06/01/2018   inadequate colon prep, hemorrhoids on perianal exam, two 4-6 mm polyps at hepatic flexure, one 18 mm polyp at IC valve s/p piecemeal removal (tubular adenomas). was to have surveillance colonoscopy in 6 months but did not return to the office  . ESOPHAGOGASTRODUODENOSCOPY (EGD) WITH PROPOFOL N/A 05/17/2018   Dr. Gala Romney: normal esophagus, erosive gastropathy s/p biopsy, duodenal erosions, felt to be NSAID effect  . HERNIA REPAIR    . POLYPECTOMY  06/01/2018   Procedure: POLYPECTOMY;  Surgeon: Daneil Dolin, MD;  Location: [facility];  Service: Endoscopy;;  colon       Family History  Problem Relation Age of Onset  . Congestive Heart Failure Mother   . Congestive Heart Failure Father   . Cancer Brother        unknown kind   . Colon cancer Neg Hx   . Colon polyps Neg Hx     Social History  Tobacco Use  . Smoking status: Current Every Day Smoker    Packs/day: 0.50    Years: 30.00    Pack years: 15.00    Types: Cigarettes  . Smokeless tobacco: Never Used  . Tobacco comment: 2 cigarettes a day.  Vaping Use  . Vaping Use: Never used  Substance Use Topics  . Alcohol use: Not Currently    Alcohol/week: 2.0 standard drinks    Types: 2 Shots of liquor per week    Comment: denies  . Drug use: No    Home Medications Prior to Admission medications   Medication Sig Start Date End Date Taking? Authorizing Provider  amLODipine (NORVASC) 5 MG tablet Take 1  tablet (5 mg total) by mouth daily. 06/21/19   Barton Dubois, MD  atorvastatin (LIPITOR) 10 MG tablet Take 10 mg by mouth at bedtime.    [provider]  calcium carbonate (TUMS - DOSED IN MG ELEMENTAL CALCIUM) 500 MG chewable tablet Chew 2 tablets (400 mg of elemental calcium total) by mouth 2 (two) times daily with a meal. 05/30/18   Tat, Shanon Brow, MD  carvedilol (COREG) 25 MG tablet Take 25 mg by mouth 2 (two) times daily.    [provider]  gabapentin (NEURONTIN) 300 MG capsule Take 300 mg by mouth 3 (three) times daily. 05/20/19   [provider]  hydrocortisone (ANUSOL-HC) 2.5 % rectal cream Place rectally 3 (three) times daily. Apply to anorectum every 6 hours x 10 days 06/01/18   Tat, Shanon Brow, MD  magnesium oxide (MAG-OX) 400 (241.3 Mg) MG tablet Take 1 tablet (400 mg total) by mouth daily. 06/21/19   Barton Dubois, MD  metFORMIN (GLUCOPHAGE-XR) 500 MG 24 hr tablet Take 1 tablet (500 mg total) by mouth daily with breakfast. 06/20/19   Barton Dubois, MD  Multiple Vitamin (MULTIVITAMIN WITH MINERALS) TABS tablet Take 1 tablet by mouth daily. 06/21/19   Barton Dubois, MD  pantoprazole (PROTONIX) 40 MG tablet Take 1 tablet (40 mg total) by mouth daily for 30 days. 05/19/18 06/18/18  Manuella Ghazi, Pratik D, DO  potassium chloride SA (KLOR-CON) 20 MEQ tablet Take 2 tablets (40 mEq total) by mouth daily. 06/21/19   Barton Dubois, MD  saccharomyces boulardii (FLORASTOR) 250 MG capsule Take 1 capsule (250 mg total) by mouth 2 (two) times daily. 06/20/19   Barton Dubois, MD  tamsulosin (FLOMAX) 0.4 MG CAPS capsule Take 1 capsule (0.4 mg total) by mouth 2 (two) times daily. 06/29/19   McKenzie, Candee Furbish, MD    Allergies    Patient has no known allergies.  Review of Systems   Review of Systems Ten systems reviewed and are negative for acute change, except as noted in the HPI.   Physical Exam Updated Vital Signs BP (!) 144/93 (BP Location: Left Arm)   Pulse (!) 115   Temp 98.4 F (36.9 C)  (Oral)   Resp 18   Ht 6\' 1"  (1.854 m)   Wt 80.7 kg   SpO2 100%   BMI 23.48 kg/m   Physical Exam Vitals and nursing note reviewed.  Constitutional:      General: He is not in acute distress.    Appearance: He is cachectic. He is not diaphoretic.  HENT:     Head: Normocephalic and atraumatic.  Eyes:     General: No scleral icterus.    Conjunctiva/sclera: Conjunctivae normal.  Cardiovascular:     Rate and Rhythm: Normal rate and regular rhythm.     Heart sounds: Normal heart sounds.  Pulmonary:     Effort: Pulmonary effort is normal. No respiratory distress.     Breath sounds: Normal breath sounds.  Abdominal:     General: There is distension.     Palpations: Abdomen is soft.     Tenderness: There is no abdominal tenderness.  Musculoskeletal:     Cervical back: Normal range of motion and neck supple.  Skin:    General: Skin is warm and dry.  Neurological:     Mental Status: He is alert.  Psychiatric:        Behavior: Behavior normal.     ED Results / Procedures / Treatments   Labs (all labs ordered are listed, but only abnormal results are displayed) Labs Reviewed  CBG MONITORING, ED - Abnormal; Notable for the following components:      Result Value   Glucose-Capillary 139 (*)    All other components within normal limits  BASIC METABOLIC PANEL  CBC  URINALYSIS, ROUTINE W REFLEX MICROSCOPIC  LIPASE, BLOOD  HEPATIC FUNCTION PANEL  TROPONIN I (HIGH SENSITIVITY)    EKG None  Radiology No results found.  Procedures Procedures (including critical care time)  Medications Ordered in ED Medications - No data to display  ED Course  I have reviewed the triage vital signs and the nursing notes.  Pertinent labs & imaging results that were available during my care of the patient were reviewed by me and considered in my medical decision making (see chart for details).  Clinical Course as of Jan 17 1837  Wed Jan 18, 2020  1829 Creatinine(!): 2.00 [MV]      Clinical Course User Index [MV] Eustaquio Maize, [place]   MDM Rules/Calculators/A&P                          69 year old male here with a history of alcoholism here with complaint of unintentional weight loss, abdominal distention and diarrhea.  I have ordered and reviewed labs which include a CBC.  Hemoglobin is 10.3 and normocytic.  CBG shows mildly elevated blood glucose of 140.  BMP shows elevated creatinine of 2.0.  Which appears to be new over the past 7 months Hepatic function panel is pending. Concern for cirrhosis, or cancer.  Patient signed out to [facility] Final Clinical Impression(s) / ED Diagnoses Final diagnoses:  None    Rx / DC Orders ED Discharge Orders    None       Margarita Mail, PA-C 01/18/20 1841    Maudie Flakes, MD 01/18/20 2128

## 2020-01-18 NOTE — ED Provider Notes (Signed)
Care assumed from [facility], [place], at shift change, please see their notes for full documentation of patient's complaint/HPI. Briefly, pt here with complaint of generalized weakness x weeks, worsening recently with significant weight loss and abdominal distention. Results so far show clear CXR and no leukocytosis. Awaiting remainder of labs including BMP, LFTs, lipase and CT Chest,Abdomen, Pelvis with concern for liver failure/cancer. Plan is to admit.   Physical Exam  BP 111/63   Pulse 92   Temp 98.4 F (36.9 C) (Oral)   Resp 18   Ht 6\' 1"  (1.854 m)   Wt 80.7 kg   SpO2 100%   BMI 23.48 kg/m   Physical Exam Vitals and nursing note reviewed.  Constitutional:      Appearance: He is ill-appearing.  HENT:     Head: Normocephalic and atraumatic.  Eyes:     General: Scleral icterus present.     Conjunctiva/sclera: Conjunctivae normal.  Cardiovascular:     Rate and Rhythm: Normal rate and regular rhythm.  Pulmonary:     Effort: Pulmonary effort is normal.     Breath sounds: Normal breath sounds. No wheezing, rhonchi or rales.  Abdominal:     General: There is distension.     Tenderness: There is abdominal tenderness.     Comments: Distended abdomen consistent with ascites with diffuse TTP  Skin:    General: Skin is warm and dry.     Coloration: Skin is not jaundiced.  Neurological:     Mental Status: He is alert.     ED Course/Procedures   Clinical Course as of Jan 17 2025  Wed Jan 18, 2020  1829 Creatinine(!): 2.00 [MV]    Clinical Course User Index [MV] Eustaquio Maize, PA-C    Procedures  Results for orders placed or performed during the hospital encounter of 56/25/63  Basic metabolic panel  Result Value Ref Range   Sodium 134 (L) 135 - 145 mmol/L   Potassium 4.0 3.5 - 5.1 mmol/L   Chloride 106 98 - 111 mmol/L   CO2 18 (L) 22 - 32 mmol/L   Glucose, Bld 158 (H) 70 - 99 mg/dL   BUN 27 (H) 8 - 23 mg/dL   Creatinine, Ser 2.00 (H) 0.61 - 1.24 mg/dL   Calcium  7.8 (L) 8.9 - 10.3 mg/dL   GFR, Estimated 36 (L) >60 mL/min   Anion gap 10 5 - 15  CBC  Result Value Ref Range   WBC 9.2 4.0 - 10.5 K/uL   RBC 3.15 (L) 4.22 - 5.81 MIL/uL   Hemoglobin 10.3 (L) 13.0 - 17.0 g/dL   HCT 30.7 (L) 39 - 52 %   MCV 97.5 80.0 - 100.0 fL   MCH 32.7 26.0 - 34.0 pg   MCHC 33.6 30.0 - 36.0 g/dL   RDW 15.4 11.5 - 15.5 %   Platelets 226 150 - 400 K/uL   nRBC 0.0 0.0 - 0.2 %  Lipase, blood  Result Value Ref Range   Lipase 13 11 - 51 U/L  Hepatic function panel  Result Value Ref Range   Total Protein 6.6 6.5 - 8.1 g/dL   Albumin 2.3 (L) 3.5 - 5.0 g/dL   AST 72 (H) 15 - 41 U/L   ALT 28 0 - 44 U/L   Alkaline Phosphatase 125 38 - 126 U/L   Total Bilirubin 3.3 (H) 0.3 - 1.2 mg/dL   Bilirubin, Direct 1.7 (H) 0.0 - 0.2 mg/dL   Indirect Bilirubin 1.6 (H) 0.3 - 0.9  mg/dL  CBG monitoring, ED  Result Value Ref Range   Glucose-Capillary 139 (H) 70 - 99 mg/dL  Troponin I (High Sensitivity)  Result Value Ref Range   Troponin I (High Sensitivity) 11 <18 ng/L  Troponin I (High Sensitivity)  Result Value Ref Range   Troponin I (High Sensitivity) 2 <18 ng/L    CT Chest W Contrast  Result Date: 01/18/2020 CLINICAL DATA:  Weakness, weight loss, abdominal distension EXAM: CT CHEST, ABDOMEN, AND PELVIS WITH CONTRAST TECHNIQUE: Multidetector CT imaging of the chest, abdomen and pelvis was performed following the standard protocol during bolus administration of intravenous contrast. CONTRAST:  56mL OMNIPAQUE IOHEXOL 300 MG/ML  SOLN COMPARISON:  06/13/2019 FINDINGS: CT CHEST FINDINGS Cardiovascular: The heart is unremarkable without pericardial effusion. Mild atherosclerosis of the aorta without thoracic aortic aneurysm or dissection. Mediastinum/Nodes: No enlarged mediastinal, hilar, or axillary lymph nodes. Thyroid gland, trachea, and esophagus demonstrate no significant findings. Lungs/Pleura: Trace left pleural effusion. No acute airspace disease or pneumothorax. The central  airways are patent. Musculoskeletal: No acute or destructive bony lesions. Reconstructed images demonstrate no additional findings. CT ABDOMEN PELVIS FINDINGS Hepatobiliary: Diffuse heterogeneity of the liver is noted. Overall, increased enhancement since prior study where diffuse hepatic steatosis was noted. Findings could reflect underlying hepatitis, please correlate with liver function tests. Gallbladder is unremarkable. Pancreas: Diffuse pancreatic atrophy unchanged. Spleen: Normal in size without focal abnormality. Adrenals/Urinary Tract: Multiple right renal cortical cysts unchanged. Otherwise the kidneys enhance normally. No urinary tract calculi or obstruction. Bladder is decompressed, which limits its evaluation. The adrenals are normal. Stomach/Bowel: No bowel obstruction or ileus. Mild wall thickening of the centralized small bowel loops is likely due to underlying liver disease and diffuse ascites. Vascular/Lymphatic: Splenic vein, SMV, and portal vein are patent. Small esophageal and gastric varices are seen within the upper abdomen, as well as recanalization of the umbilical vein. Findings are consistent with portal venous hypertension. Minimal atherosclerosis of the distal aorta. No pathologic adenopathy. Reproductive: Prostate is unremarkable. Other: Large volume ascites is noted. Ascites extends into a right inguinal hernia. No bowel herniation. No free intraperitoneal gas. Musculoskeletal: No acute or destructive bony lesions. Reconstructed images demonstrate no additional findings. IMPRESSION: 1. Diffuse heterogeneity of the liver parenchyma consistent with a combination of hepatitis and underlying hepatic steatosis. 2. Large volume ascites. 3. Trace left pleural effusion. 4. Small gastric and esophageal varices. 5. Right inguinal hernia containing peritoneal fat and ascites. No bowel herniation. Electronically Signed   By: Randa Ngo M.D.   On: 01/18/2020 19:51   CT ABDOMEN PELVIS W  CONTRAST  Result Date: 01/18/2020 CLINICAL DATA:  Weakness, weight loss, abdominal distension EXAM: CT CHEST, ABDOMEN, AND PELVIS WITH CONTRAST TECHNIQUE: Multidetector CT imaging of the chest, abdomen and pelvis was performed following the standard protocol during bolus administration of intravenous contrast. CONTRAST:  4mL OMNIPAQUE IOHEXOL 300 MG/ML  SOLN COMPARISON:  06/13/2019 FINDINGS: CT CHEST FINDINGS Cardiovascular: The heart is unremarkable without pericardial effusion. Mild atherosclerosis of the aorta without thoracic aortic aneurysm or dissection. Mediastinum/Nodes: No enlarged mediastinal, hilar, or axillary lymph nodes. Thyroid gland, trachea, and esophagus demonstrate no significant findings. Lungs/Pleura: Trace left pleural effusion. No acute airspace disease or pneumothorax. The central airways are patent. Musculoskeletal: No acute or destructive bony lesions. Reconstructed images demonstrate no additional findings. CT ABDOMEN PELVIS FINDINGS Hepatobiliary: Diffuse heterogeneity of the liver is noted. Overall, increased enhancement since prior study where diffuse hepatic steatosis was noted. Findings could reflect underlying hepatitis, please correlate with liver function  tests. Gallbladder is unremarkable. Pancreas: Diffuse pancreatic atrophy unchanged. Spleen: Normal in size without focal abnormality. Adrenals/Urinary Tract: Multiple right renal cortical cysts unchanged. Otherwise the kidneys enhance normally. No urinary tract calculi or obstruction. Bladder is decompressed, which limits its evaluation. The adrenals are normal. Stomach/Bowel: No bowel obstruction or ileus. Mild wall thickening of the centralized small bowel loops is likely due to underlying liver disease and diffuse ascites. Vascular/Lymphatic: Splenic vein, SMV, and portal vein are patent. Small esophageal and gastric varices are seen within the upper abdomen, as well as recanalization of the umbilical vein. Findings are  consistent with portal venous hypertension. Minimal atherosclerosis of the distal aorta. No pathologic adenopathy. Reproductive: Prostate is unremarkable. Other: Large volume ascites is noted. Ascites extends into a right inguinal hernia. No bowel herniation. No free intraperitoneal gas. Musculoskeletal: No acute or destructive bony lesions. Reconstructed images demonstrate no additional findings. IMPRESSION: 1. Diffuse heterogeneity of the liver parenchyma consistent with a combination of hepatitis and underlying hepatic steatosis. 2. Large volume ascites. 3. Trace left pleural effusion. 4. Small gastric and esophageal varices. 5. Right inguinal hernia containing peritoneal fat and ascites. No bowel herniation. Electronically Signed   By: Randa Ngo M.D.   On: 01/18/2020 19:51   DG Chest Port 1 View  Result Date: 01/18/2020 CLINICAL DATA:  69 year old male with tachycardia. EXAM: PORTABLE CHEST 1 VIEW COMPARISON:  Chest radiograph dated 06/04/2019. FINDINGS: Shallow inspiration. No focal consolidation, pleural effusion, pneumothorax. The cardiac silhouette is within limits. No acute osseous pathology. IMPRESSION: No active disease. Electronically Signed   By: Anner Crete M.D.   On: 01/18/2020 17:25    MDM  BMP with a creatinine of 2.00 and a GFR of 36.  New from previous labs 7 months ago.  Will provide fluids at this time.  Lab Results  Component Value Date   CREATININE 2.00 (H) 01/18/2020   CREATININE 0.88 06/20/2019   CREATININE 0.79 06/19/2019   LFTs with a total bili of 3.3, mild elevation in AST at 72. Lipase normal at 13. Troponint of 11, will repeat.  CTs with findings of diffuse heterogenicity of the liver consistent with accommodation of hepatitis and underlying hepatic steatosis with a large volume of ascites.  Added on hepatitis panel at this time with plan to admit patient.  Have updated patient and son on findings today.  They are in agreement with admission.  Covid test  ordered.   Discussed case with [facility] Dr. Olevia Bowens who will evaluate patient for admission.      Eustaquio Maize, PA-C 01/18/20 2039    Maudie Flakes, MD 01/18/20 2128

## 2020-01-18 NOTE — ED Notes (Signed)
Pt was informed that we need a urine specimen.  

## 2020-01-18 NOTE — H&P (Signed)
History and Physical    Darren Howard ZHG:[id number] DOB: 13-May-1950 DOA: 01/18/2020  PCP: [name], MD   Patient coming from: Home.  I have personally briefly reviewed patient's old medical records in [place]  Chief Complaint: Weakness.  HPI: Darren Howard is a 69 y.o. male with medical history significant of alcohol abuse, tobacco abuse, microcytic anemia, type II DM, history of upper GI bleed, duodenal erosion, erosive gastropathy, hyperlipidemia, hypertension, noncompliance of medications and treatment who is referred by his PCP to the emergency department due to weakness, decreased appetite, weight loss and abdominal distention of several months duration, but particularly significant in the past few weeks.  He denies abdominal pain, nausea, emesis, constipation, melena or hematochezia.  His stools are occasionally soft.  His hearing has been darker and less in volume recently.  No dysuria, frequency or hematuria.  He denies fever, chills, rhinorrhea, sore throat, wheezing or hemoptysis.  He gets occasional dyspnea.  Denies chest pain, palpitations, diaphoresis, PND or orthopnea, but has lower extremity edema and gets occasionally mildly lightheaded.  No polyuria, polydipsia, polyphagia or blurred vision.  ED Course: Initial vital signs were temperature 98.4 F, pulse 115, respiration eighteen, blood pressure 144/93 mmHg O2 sat 100% on room air.  The patient was given a 1000 mL NS bolus.  Labs: CBC showed a white count of 9.2, hemoglobin 10.3 g/dL and platelets 226.  Sodium 134 and CO2 18 mmol/L.  Normal potassium, chloride and calcium when corrected to albumin.  BUN was twenty-seven and creatinine 2.0 mg/dL (normal creatinine on their one on most recent previous creatinine measurements).  Total protein 6.6, albumin 2.3 g/dL.  AST seventy-two ALT twenty-eight and alkaline phosphatase 125 units/L.  Total bilirubin was 3.3 and direct bilirubin was 1.7 mg/dL.  Normal lipase and  troponin 1x2.  Still pending PT/INR, acute hepatic panel and magnesium level.  Imaging: A one-view portable chest radiograph showed cardiac silhouette within normal limits.  There was no active cardiopulmonary disease.  CT chest with contrast showed trace left pleural effusion and mild atherosclerosis of the aorta without thoracic aortic aneurysm or dissection.  CT abdomen/pelvis showed diffuse heterogenicity of the liver parenchyma consistent with a combination of hepatitis and underlying hepatic asteatosis.  There is large volume ascites.  Small gastric and esophageal varices.  There is a right inguinal hernia containing peritoneal fat and ascites.  There was no bowel herniation.  Review of Systems: As per HPI otherwise all other systems reviewed and are negative.  Past Medical History:  Diagnosis Date  . Alcohol abuse   . Anemia   . DM2 (diabetes mellitus, type 2) ([place]) 02/13/2014  . Duodenal erosion   . Erosive gastropathy   . GI bleed 04/2018  . Glucose intolerance (impaired glucose tolerance)   . Hyperlipemia   . Hypertension   . Noncompliance    Past Surgical History:  Procedure Laterality Date  . BIOPSY  05/17/2018   Procedure: BIOPSY;  Surgeon: Daneil Dolin, MD;  [facility];  Service: Endoscopy;;  . BIOPSY  06/15/2019   Procedure: BIOPSY;  Surgeon: Daneil Dolin, MD;  [facility];  Service: Endoscopy;;  . CARDIAC CATHETERIZATION    . COLONOSCOPY N/A 06/15/2019   Procedure: COLONOSCOPY;  Surgeon: Daneil Dolin, MD;  [facility];  Service: Endoscopy;  Laterality: N/A;  . COLONOSCOPY WITH PROPOFOL N/A 06/01/2018   inadequate colon prep, hemorrhoids on perianal exam, two 4-6 mm polyps at hepatic flexure, one  18 mm polyp at IC valve s/p piecemeal removal (tubular adenomas). was to have surveillance colonoscopy in 6 months but did not return to the office  . ESOPHAGOGASTRODUODENOSCOPY (EGD) WITH PROPOFOL N/A 05/17/2018   Dr. Gala Romney: normal  esophagus, erosive gastropathy s/p biopsy, duodenal erosions, felt to be NSAID effect  . HERNIA REPAIR    . POLYPECTOMY  06/01/2018   Procedure: POLYPECTOMY;  Surgeon: Daneil Dolin, MD;  [facility];  Service: Endoscopy;;  colon   Social History  reports that he has been smoking cigarettes. He has a 15.00 pack-year smoking history. He has never used smokeless tobacco. He reports previous alcohol use of about 2.0 standard drinks of alcohol per week. He reports that he does not use drugs.  No Known Allergies  Family History  Problem Relation Age of Onset  . Congestive Heart Failure Mother   . Congestive Heart Failure Father   . Cancer Brother        unknown kind   . Colon cancer Neg Hx   . Colon polyps Neg Hx    Prior to Admission medications   Medication Sig Start Date End Date Taking? Authorizing Provider  amLODipine (NORVASC) 5 MG tablet Take 1 tablet (5 mg total) by mouth daily. 06/21/19  Yes Barton Dubois, MD  ammonium lactate (AMLACTIN) 12 % cream Apply topically 2 (two) times daily. 12/24/19  Yes [provider]  carvedilol (COREG) 25 MG tablet Take 25 mg by mouth 2 (two) times daily.   Yes [provider]  Multiple Vitamin (MULTIVITAMIN WITH MINERALS) TABS tablet Take 1 tablet by mouth daily. 06/21/19  Yes Barton Dubois, MD  potassium chloride SA (KLOR-CON) 20 MEQ tablet Take 2 tablets (40 mEq total) by mouth daily. 06/21/19  Yes Barton Dubois, MD  atorvastatin (LIPITOR) 10 MG tablet Take 10 mg by mouth at bedtime. Patient not taking: Reported on 01/18/2020    [provider]  calcium carbonate (TUMS - DOSED IN MG ELEMENTAL CALCIUM) 500 MG chewable tablet Chew 2 tablets (400 mg of elemental calcium total) by mouth 2 (two) times daily with a meal. Patient not taking: Reported on 01/18/2020 05/30/18   Orson Eva, MD  gabapentin (NEURONTIN) 300 MG capsule Take 300 mg by mouth 3 (three) times daily. 05/20/19   [provider]  hydrocortisone  (ANUSOL-HC) 2.5 % rectal cream Place rectally 3 (three) times daily. Apply to anorectum every 6 hours x 10 days Patient not taking: Reported on 01/18/2020 06/01/18   Orson Eva, MD  magnesium oxide (MAG-OX) 400 (241.3 Mg) MG tablet Take 1 tablet (400 mg total) by mouth daily. Patient not taking: Reported on 01/18/2020 06/21/19   Barton Dubois, MD  metFORMIN (GLUCOPHAGE-XR) 500 MG 24 hr tablet Take 1 tablet (500 mg total) by mouth daily with breakfast. Patient not taking: Reported on 01/18/2020 06/20/19   Barton Dubois, MD  pantoprazole (PROTONIX) 40 MG tablet Take 1 tablet (40 mg total) by mouth daily for 30 days. Patient not taking: Reported on 01/18/2020 05/19/18 06/18/18  Heath Lark D, DO  saccharomyces boulardii (FLORASTOR) 250 MG capsule Take 1 capsule (250 mg total) by mouth 2 (two) times daily. Patient not taking: Reported on 01/18/2020 06/20/19   Barton Dubois, MD  tamsulosin (FLOMAX) 0.4 MG CAPS capsule Take 1 capsule (0.4 mg total) by mouth 2 (two) times daily. Patient not taking: Reported on 01/18/2020 06/29/19   Cleon Gustin, MD   Physical Exam: Vitals:   01/18/20 1802 01/18/20 1830 01/18/20 1900 01/18/20  2000  BP: 126/78 119/78 133/82 111/63  Pulse: (!) 109 91 94 92  Resp: 19 17 17 18   Temp:      TempSrc:      SpO2: 99% 100% 100% 100%  Weight:      Height:       Constitutional: Looks chronically ill.  Emaciated. Eyes: PERRL, lids and conjunctivae are injected.  Icteric sclerae. ENMT: Mucous membranes are moist. Posterior pharynx clear of any exudate or lesions. Neck: normal, supple, no masses, no thyromegaly Respiratory: Decreased breath sounds on bases, otherwise clear to auscultation bilaterally, no wheezing, no crackles. Normal respiratory effort. No accessory muscle use.  Cardiovascular: Regular rate and rhythm with occasional extrasystole, no murmurs / rubs / gallops.  2+ pitting lower extremity edema. 2+ pedal pulses. No carotid bruits.  Abdomen: Distended.  Positive  ascites.  Bowel sounds positive.  Soft, no tenderness, no masses palpated. No hepatosplenomegaly..  Musculoskeletal: no clubbing / cyanosis.  Good ROM, no contractures. Normal muscle tone.  Skin: Positive icterus.  No rashes, lesions, ulcers Neurologic: CN 2-12 grossly intact. Sensation intact, DTR normal. Strength 5/5 in all 4.  Psychiatric: Normal judgment and insight. Alert and oriented x 3. Normal mood.   Labs on Admission: I have personally reviewed following labs and imaging studies  CBC: Recent Labs  Lab 01/18/20 1722  WBC 9.2  HGB 10.3*  HCT 30.7*  MCV 97.5  PLT 381    Basic Metabolic Panel: Recent Labs  Lab 01/18/20 1722  NA 134*  K 4.0  CL 106  CO2 18*  GLUCOSE 158*  BUN 27*  CREATININE 2.00*  CALCIUM 7.8*    GFR: Estimated Creatinine Clearance: 40.5 mL/min (A) (by C-G formula based on SCr of 2 mg/dL (H)).  Liver Function Tests: Recent Labs  Lab 01/18/20 1722  AST 72*  ALT 28  ALKPHOS 125  BILITOT 3.3*  PROT 6.6  ALBUMIN 2.3*   Radiological Exams on Admission: CT Chest W Contrast  Result Date: 01/18/2020 CLINICAL DATA:  Weakness, weight loss, abdominal distension EXAM: CT CHEST, ABDOMEN, AND PELVIS WITH CONTRAST TECHNIQUE: Multidetector CT imaging of the chest, abdomen and pelvis was performed following the standard protocol during bolus administration of intravenous contrast. CONTRAST:  1mL OMNIPAQUE IOHEXOL 300 MG/ML  SOLN COMPARISON:  06/13/2019 FINDINGS: CT CHEST FINDINGS Cardiovascular: The heart is unremarkable without pericardial effusion. Mild atherosclerosis of the aorta without thoracic aortic aneurysm or dissection. Mediastinum/Nodes: No enlarged mediastinal, hilar, or axillary lymph nodes. Thyroid gland, trachea, and esophagus demonstrate no significant findings. Lungs/Pleura: Trace left pleural effusion. No acute airspace disease or pneumothorax. The central airways are patent. Musculoskeletal: No acute or destructive bony lesions.  Reconstructed images demonstrate no additional findings. CT ABDOMEN PELVIS FINDINGS Hepatobiliary: Diffuse heterogeneity of the liver is noted. Overall, increased enhancement since prior study where diffuse hepatic steatosis was noted. Findings could reflect underlying hepatitis, please correlate with liver function tests. Gallbladder is unremarkable. Pancreas: Diffuse pancreatic atrophy unchanged. Spleen: Normal in size without focal abnormality. Adrenals/Urinary Tract: Multiple right renal cortical cysts unchanged. Otherwise the kidneys enhance normally. No urinary tract calculi or obstruction. Bladder is decompressed, which limits its evaluation. The adrenals are normal. Stomach/Bowel: No bowel obstruction or ileus. Mild wall thickening of the centralized small bowel loops is likely due to underlying liver disease and diffuse ascites. Vascular/Lymphatic: Splenic vein, SMV, and portal vein are patent. Small esophageal and gastric varices are seen within the upper abdomen, as well as recanalization of the umbilical vein. Findings are consistent with  portal venous hypertension. Minimal atherosclerosis of the distal aorta. No pathologic adenopathy. Reproductive: Prostate is unremarkable. Other: Large volume ascites is noted. Ascites extends into a right inguinal hernia. No bowel herniation. No free intraperitoneal gas. Musculoskeletal: No acute or destructive bony lesions. Reconstructed images demonstrate no additional findings. IMPRESSION: 1. Diffuse heterogeneity of the liver parenchyma consistent with a combination of hepatitis and underlying hepatic steatosis. 2. Large volume ascites. 3. Trace left pleural effusion. 4. Small gastric and esophageal varices. 5. Right inguinal hernia containing peritoneal fat and ascites. No bowel herniation. Electronically Signed   By: Randa Ngo M.D.   On: 01/18/2020 19:51   CT ABDOMEN PELVIS W CONTRAST  Result Date: 01/18/2020 CLINICAL DATA:  Weakness, weight loss,  abdominal distension EXAM: CT CHEST, ABDOMEN, AND PELVIS WITH CONTRAST TECHNIQUE: Multidetector CT imaging of the chest, abdomen and pelvis was performed following the standard protocol during bolus administration of intravenous contrast. CONTRAST:  70mL OMNIPAQUE IOHEXOL 300 MG/ML  SOLN COMPARISON:  06/13/2019 FINDINGS: CT CHEST FINDINGS Cardiovascular: The heart is unremarkable without pericardial effusion. Mild atherosclerosis of the aorta without thoracic aortic aneurysm or dissection. Mediastinum/Nodes: No enlarged mediastinal, hilar, or axillary lymph nodes. Thyroid gland, trachea, and esophagus demonstrate no significant findings. Lungs/Pleura: Trace left pleural effusion. No acute airspace disease or pneumothorax. The central airways are patent. Musculoskeletal: No acute or destructive bony lesions. Reconstructed images demonstrate no additional findings. CT ABDOMEN PELVIS FINDINGS Hepatobiliary: Diffuse heterogeneity of the liver is noted. Overall, increased enhancement since prior study where diffuse hepatic steatosis was noted. Findings could reflect underlying hepatitis, please correlate with liver function tests. Gallbladder is unremarkable. Pancreas: Diffuse pancreatic atrophy unchanged. Spleen: Normal in size without focal abnormality. Adrenals/Urinary Tract: Multiple right renal cortical cysts unchanged. Otherwise the kidneys enhance normally. No urinary tract calculi or obstruction. Bladder is decompressed, which limits its evaluation. The adrenals are normal. Stomach/Bowel: No bowel obstruction or ileus. Mild wall thickening of the centralized small bowel loops is likely due to underlying liver disease and diffuse ascites. Vascular/Lymphatic: Splenic vein, SMV, and portal vein are patent. Small esophageal and gastric varices are seen within the upper abdomen, as well as recanalization of the umbilical vein. Findings are consistent with portal venous hypertension. Minimal atherosclerosis of the  distal aorta. No pathologic adenopathy. Reproductive: Prostate is unremarkable. Other: Large volume ascites is noted. Ascites extends into a right inguinal hernia. No bowel herniation. No free intraperitoneal gas. Musculoskeletal: No acute or destructive bony lesions. Reconstructed images demonstrate no additional findings. IMPRESSION: 1. Diffuse heterogeneity of the liver parenchyma consistent with a combination of hepatitis and underlying hepatic steatosis. 2. Large volume ascites. 3. Trace left pleural effusion. 4. Small gastric and esophageal varices. 5. Right inguinal hernia containing peritoneal fat and ascites. No bowel herniation. Electronically Signed   By: Randa Ngo M.D.   On: 01/18/2020 19:51   DG Chest Port 1 View  Result Date: 01/18/2020 CLINICAL DATA:  69 year old male with tachycardia. EXAM: PORTABLE CHEST 1 VIEW COMPARISON:  Chest radiograph dated 06/04/2019. FINDINGS: Shallow inspiration. No focal consolidation, pleural effusion, pneumothorax. The cardiac silhouette is within limits. No acute osseous pathology. IMPRESSION: No active disease. Electronically Signed   By: Anner Crete M.D.   On: 01/18/2020 17:25    EKG: Independently reviewed.  Vent. rate 99 BPM PR interval * ms QRS duration 104 ms QT/QTc 412/529 ms P-R-T axes 45 -64 89 Sinus tachycardia Multiple premature complexes, vent & supraven LAD, consider left anterior fascicular block Anterolateral infarct, age indeterminate Prolonged QT interval  Assessment/Plan Principal Problem:   AKI (acute kidney injury) in the setting of:   Hepatorenal syndrome type II ([place]) Observation/telemetry. N.p.o. after midnight. Received NS bolus earlier. Begin albumin 50 g IV every 6 hours. Midodrine 7.5 mg p.o. 3 times daily. Octreotide infusion 50 mcg/kg/hr. Check PT/INR. IR large-volume paracentesis tomorrow. Ceftriaxone 1 g IVPB every 24 hours. Consult gastroenterology in a.m. Follow-up hepatitis panel.  Active  Problems:   Prolonged QT interval Magnesium was supplemented. Avoid medications that increase QTc. Ceftriaxone for SBP prophylaxis.    Hypertension Hold antihypertensives for now. Monitor blood pressure.    Normocytic anemia In the setting of liver cirrhosis. Check stool occult blood. Check anemia panel. Monitor hematocrit and hemoglobin. Transfuse as needed.    DM2 (diabetes mellitus, type 2) ([facility]) N.p.o. after midnight. Monitor CBG. Check hemoglobin A1c.    Alcohol abuse No longer active. MVI and thiamine.    Tobacco use Nicotine replacement therapy offered.    HLD (hyperlipidemia) Deferred treatment due to liver cirrhosis. Follow-up with PCP for other treatment options.     DVT prophylaxis: SCDs. Code Status:   Full code.   Discussed with the patient and his son [name]. He will discuss it with his (3) siblings. Family Communication: Disposition Plan:   Patient is from:  Home.  Anticipated DC to:  Home.  Anticipated DC date:  01/19/2020 or 01/20/2020.  Anticipated DC barriers: Clinical status.  Consults called:  Routine gastroenterology consult. Admission status:  Observation/telemetry.   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  Reubin Milan MD [facility]  How to contact the [facility] Attending or Consulting provider [place] or covering provider during after hours [place], for this patient?   1. Check the care team in [facility] and look for a) attending/consulting [facility] provider listed and b) the [facility] team listed 2. Log into [email or web address] and use [place]'s universal password to access. If you do not have the password, please contact the hospital operator. 3. Locate the [facility] provider you are looking for under [facility] and page to a number that you can be directly reached. 4. If you still have difficulty reaching the provider, please page the [facility] (Director on Call) for the Hospitalists listed on amion for assistance.  01/18/2020, 8:58 PM   This document was prepared using Dragon voice recognition software and may contain some unintended transcription errors.

## 2020-01-18 NOTE — ED Notes (Signed)
In and out cath attempted. 100cc out when urine stopped. Removed cath clot at end. Attempted cath x2 met with resistance. RN at bedside during catherization

## 2020-01-19 DIAGNOSIS — E119 Type 2 diabetes mellitus without complications: Secondary | ICD-10-CM | POA: Diagnosis not present

## 2020-01-19 DIAGNOSIS — K649 Unspecified hemorrhoids: Secondary | ICD-10-CM | POA: Diagnosis not present

## 2020-01-19 DIAGNOSIS — Z20822 Contact with and (suspected) exposure to covid-19: Secondary | ICD-10-CM | POA: Diagnosis not present

## 2020-01-19 DIAGNOSIS — D509 Iron deficiency anemia, unspecified: Secondary | ICD-10-CM | POA: Diagnosis not present

## 2020-01-19 DIAGNOSIS — D62 Acute posthemorrhagic anemia: Secondary | ICD-10-CM | POA: Diagnosis not present

## 2020-01-19 DIAGNOSIS — R188 Other ascites: Secondary | ICD-10-CM | POA: Diagnosis not present

## 2020-01-19 DIAGNOSIS — I700 Atherosclerosis of aorta: Secondary | ICD-10-CM | POA: Diagnosis present

## 2020-01-19 DIAGNOSIS — K625 Hemorrhage of anus and rectum: Secondary | ICD-10-CM | POA: Diagnosis not present

## 2020-01-19 DIAGNOSIS — K3189 Other diseases of stomach and duodenum: Secondary | ICD-10-CM | POA: Diagnosis not present

## 2020-01-19 DIAGNOSIS — K648 Other hemorrhoids: Secondary | ICD-10-CM | POA: Diagnosis not present

## 2020-01-19 DIAGNOSIS — R64 Cachexia: Secondary | ICD-10-CM | POA: Diagnosis not present

## 2020-01-19 DIAGNOSIS — K760 Fatty (change of) liver, not elsewhere classified: Secondary | ICD-10-CM | POA: Diagnosis not present

## 2020-01-19 DIAGNOSIS — K921 Melena: Secondary | ICD-10-CM | POA: Diagnosis not present

## 2020-01-19 DIAGNOSIS — N179 Acute kidney failure, unspecified: Secondary | ICD-10-CM | POA: Diagnosis not present

## 2020-01-19 DIAGNOSIS — K7031 Alcoholic cirrhosis of liver with ascites: Secondary | ICD-10-CM | POA: Diagnosis not present

## 2020-01-19 DIAGNOSIS — K4090 Unilateral inguinal hernia, without obstruction or gangrene, not specified as recurrent: Secondary | ICD-10-CM | POA: Diagnosis present

## 2020-01-19 DIAGNOSIS — K529 Noninfective gastroenteritis and colitis, unspecified: Secondary | ICD-10-CM | POA: Diagnosis not present

## 2020-01-19 DIAGNOSIS — I10 Essential (primary) hypertension: Secondary | ICD-10-CM | POA: Diagnosis not present

## 2020-01-19 DIAGNOSIS — K449 Diaphragmatic hernia without obstruction or gangrene: Secondary | ICD-10-CM | POA: Diagnosis not present

## 2020-01-19 DIAGNOSIS — J449 Chronic obstructive pulmonary disease, unspecified: Secondary | ICD-10-CM | POA: Diagnosis present

## 2020-01-19 DIAGNOSIS — R2681 Unsteadiness on feet: Secondary | ICD-10-CM | POA: Diagnosis present

## 2020-01-19 DIAGNOSIS — F1010 Alcohol abuse, uncomplicated: Secondary | ICD-10-CM | POA: Diagnosis present

## 2020-01-19 DIAGNOSIS — K644 Residual hemorrhoidal skin tags: Secondary | ICD-10-CM | POA: Diagnosis not present

## 2020-01-19 DIAGNOSIS — E1122 Type 2 diabetes mellitus with diabetic chronic kidney disease: Secondary | ICD-10-CM | POA: Diagnosis not present

## 2020-01-19 DIAGNOSIS — K767 Hepatorenal syndrome: Secondary | ICD-10-CM | POA: Diagnosis not present

## 2020-01-19 DIAGNOSIS — R42 Dizziness and giddiness: Secondary | ICD-10-CM | POA: Diagnosis present

## 2020-01-19 DIAGNOSIS — E785 Hyperlipidemia, unspecified: Secondary | ICD-10-CM | POA: Diagnosis present

## 2020-01-19 DIAGNOSIS — I8510 Secondary esophageal varices without bleeding: Secondary | ICD-10-CM | POA: Diagnosis not present

## 2020-01-19 DIAGNOSIS — K759 Inflammatory liver disease, unspecified: Secondary | ICD-10-CM | POA: Diagnosis present

## 2020-01-19 DIAGNOSIS — D649 Anemia, unspecified: Secondary | ICD-10-CM | POA: Diagnosis not present

## 2020-01-19 LAB — COMPREHENSIVE METABOLIC PANEL
ALT: 22 U/L (ref 0–44)
AST: 44 U/L — ABNORMAL HIGH (ref 15–41)
Albumin: 3.2 g/dL — ABNORMAL LOW (ref 3.5–5.0)
Alkaline Phosphatase: 86 U/L (ref 38–126)
Anion gap: 11 (ref 5–15)
BUN: 24 mg/dL — ABNORMAL HIGH (ref 8–23)
CO2: 18 mmol/L — ABNORMAL LOW (ref 22–32)
Calcium: 8 mg/dL — ABNORMAL LOW (ref 8.9–10.3)
Chloride: 107 mmol/L (ref 98–111)
Creatinine, Ser: 1.73 mg/dL — ABNORMAL HIGH (ref 0.61–1.24)
GFR, Estimated: 43 mL/min — ABNORMAL LOW (ref >60)
Glucose, Bld: 106 mg/dL — ABNORMAL HIGH (ref 70–99)
Potassium: 2.8 mmol/L — ABNORMAL LOW (ref 3.5–5.1)
Sodium: 136 mmol/L (ref 135–145)
Total Bilirubin: 3.2 mg/dL — ABNORMAL HIGH (ref 0.3–1.2)
Total Protein: 6.5 g/dL (ref 6.5–8.1)

## 2020-01-19 LAB — CBC
HCT: 24.6 % — ABNORMAL LOW (ref 39.0–52.0)
Hemoglobin: 8.3 g/dL — ABNORMAL LOW (ref 13.0–17.0)
MCH: 32.5 pg (ref 26.0–34.0)
MCHC: 33.7 g/dL (ref 30.0–36.0)
MCV: 96.5 fL (ref 80.0–100.0)
Platelets: 178 10*3/uL (ref 150–400)
RBC: 2.55 MIL/uL — ABNORMAL LOW (ref 4.22–5.81)
RDW: 15.4 % (ref 11.5–15.5)
WBC: 5.9 10*3/uL (ref 4.0–10.5)
nRBC: 0 % (ref 0.0–0.2)

## 2020-01-19 LAB — FOLATE: Folate: 6.3 ng/mL (ref >5.9)

## 2020-01-19 LAB — VITAMIN B12: Vitamin B-12: 1353 pg/mL — ABNORMAL HIGH (ref 180–914)

## 2020-01-19 LAB — IRON AND TIBC: Iron: 50 ug/dL (ref 45–182)

## 2020-01-19 LAB — RETICULOCYTES
Immature Retic Fract: 24 % — ABNORMAL HIGH (ref 2.3–15.9)
RBC.: 2.56 MIL/uL — ABNORMAL LOW (ref 4.22–5.81)
Retic Count, Absolute: 67.1 10*3/uL (ref 19.0–186.0)
Retic Ct Pct: 2.6 % (ref 0.4–3.1)

## 2020-01-19 LAB — MAGNESIUM: Magnesium: 0.4 mg/dL — CL (ref 1.7–2.4)

## 2020-01-19 LAB — OCCULT BLOOD X 1 CARD TO LAB, STOOL
Fecal Occult Bld: POSITIVE — AB
Fecal Occult Bld: POSITIVE — AB

## 2020-01-19 LAB — FERRITIN: Ferritin: 454 ng/mL — ABNORMAL HIGH (ref 24–336)

## 2020-01-19 LAB — CBG MONITORING, ED
Glucose-Capillary: 150 mg/dL — ABNORMAL HIGH (ref 70–99)
Glucose-Capillary: 165 mg/dL — ABNORMAL HIGH (ref 70–99)

## 2020-01-19 LAB — HEMOGLOBIN A1C
Hgb A1c MFr Bld: 4.4 % — ABNORMAL LOW (ref 4.8–5.6)
Mean Plasma Glucose: 79.58 mg/dL

## 2020-01-19 MED ORDER — PANTOPRAZOLE SODIUM 40 MG IV SOLR
40.0000 mg | Freq: Two times a day (BID) | INTRAVENOUS | Status: DC
  Administered 2020-01-19 – 2020-01-20 (×3): 40 mg via INTRAVENOUS
  Filled 2020-01-19 (×3): qty 40

## 2020-01-19 MED ORDER — NICOTINE 14 MG/24HR TD PT24
14.0000 mg | MEDICATED_PATCH | Freq: Every day | TRANSDERMAL | Status: DC
  Administered 2020-01-20 – 2020-01-26 (×7): 14 mg via TRANSDERMAL
  Filled 2020-01-19 (×7): qty 1

## 2020-01-19 MED ORDER — MIDODRINE HCL 5 MG PO TABS
7.5000 mg | ORAL_TABLET | Freq: Three times a day (TID) | ORAL | Status: DC
  Administered 2020-01-19 – 2020-01-26 (×19): 7.5 mg via ORAL
  Filled 2020-01-19 (×3): qty 2
  Filled 2020-01-19: qty 1.5
  Filled 2020-01-19 (×10): qty 2
  Filled 2020-01-19: qty 1.5
  Filled 2020-01-19 (×3): qty 2
  Filled 2020-01-19: qty 1.5
  Filled 2020-01-19: qty 2
  Filled 2020-01-19: qty 1.5
  Filled 2020-01-19 (×2): qty 2
  Filled 2020-01-19: qty 1.5

## 2020-01-19 MED ORDER — SODIUM CHLORIDE 0.9 % IV SOLN: INTRAVENOUS | Status: DC

## 2020-01-19 MED ORDER — POTASSIUM CHLORIDE CRYS ER 20 MEQ PO TBCR
40.0000 meq | EXTENDED_RELEASE_TABLET | ORAL | Status: AC
  Administered 2020-01-19 (×2): 40 meq via ORAL
  Filled 2020-01-19 (×2): qty 2

## 2020-01-19 MED ORDER — KCL IN DEXTROSE-NACL 20-5-0.45 MEQ/L-%-% IV SOLN
INTRAVENOUS | Status: DC
  Filled 2020-01-19 (×2): qty 1000

## 2020-01-19 MED ORDER — FOLIC ACID 1 MG PO TABS
1.0000 mg | ORAL_TABLET | Freq: Every day | ORAL | Status: DC
  Administered 2020-01-19 – 2020-01-26 (×8): 1 mg via ORAL
  Filled 2020-01-19 (×8): qty 1

## 2020-01-19 MED ORDER — THIAMINE HCL 100 MG PO TABS
100.0000 mg | ORAL_TABLET | Freq: Every day | ORAL | Status: DC
  Administered 2020-01-19 – 2020-01-26 (×7): 100 mg via ORAL
  Filled 2020-01-19 (×7): qty 1

## 2020-01-19 MED ORDER — MAGNESIUM SULFATE 2 GM/50ML IV SOLN
2.0000 g | Freq: Once | INTRAVENOUS | Status: AC
  Administered 2020-01-19: 2 g via INTRAVENOUS
  Filled 2020-01-19: qty 50

## 2020-01-19 MED ORDER — ALBUTEROL SULFATE HFA 108 (90 BASE) MCG/ACT IN AERS: 2.0000 | INHALATION_SPRAY | RESPIRATORY_TRACT | Status: DC | PRN

## 2020-01-19 NOTE — ED Notes (Signed)
Pt cleansed.  Pt continues to have BP in diaper

## 2020-01-19 NOTE — ED Notes (Signed)
Pt was given a breakfast tray 

## 2020-01-19 NOTE — Care Management Obs Status (Signed)
[place] NOTIFICATION   Patient Details  Name: Darren Howard MRN: [id number] Date of Birth: May 09, 1950   Medicare Observation Status Notification Given:  Yes    [name] 01/19/2020, 4:15 PM

## 2020-01-19 NOTE — Progress Notes (Signed)
[facility] night shift.  The patient's magnesium result came back at 0.4 mg/dL.  He had preemptively received 2 g of magnesium sulfate earlier given his history of alcohol abuse and QT interval prolongation on an ECG done earlier today.  However, given his very low level, I will add another 2 g of magnesium sulfate now.  Hypomagnesemia has being I added to the patient's problem list.  Tennis Must, MD

## 2020-01-19 NOTE — Progress Notes (Signed)
Patient Demographics:    Carlisle Enke, is a 69 y.o. male, DOB - 1950-04-27, KWI:[id number]  Admit date - 01/18/2020   Admitting Physician Reubin Milan, MD  Outpatient Primary MD for the patient is [name], MD  LOS - 0   Chief Complaint  Patient presents with   Weakness        Subjective:    [name] today has no fevers, no emesis,  No chest pain,   --Patient having frequent watery stools -Patient with orthostatic dizziness and unsteady gait,  Assessment  & Plan :    Principal Problem:   AKI (acute kidney injury) ([place]) Active Problems:   Hypomagnesemia   Hypertension   Normocytic anemia   DM2 (diabetes mellitus, type 2) ([place])   Alcohol abuse   Tobacco use   HLD (hyperlipidemia)   Hepatorenal syndrome ([facility])   Prolonged QT interval  Brief Summary:-  69 y.o. male with medical history significant of alcohol abuse, tobacco abuse, microcytic anemia, type II DM, history of upper GI bleed, duodenal erosion, erosive gastropathy, hyperlipidemia, hypertension, noncompliance of medications admitted with concerns for AKI in the setting of diarrhea  A/p 1)AKI----acute kidney injury suspect due to dehydration in setting of GI losses with diarrhea and possibly some component of hepatorenal syndrome-   creatinine on admission=2.0  , baseline creatinine = 0.8   , creatinine is now= improved to 1.73 -Continue IV octreotide ---renally adjust medications, avoid nephrotoxic agents / dehydration  / hypotension  2) persistent diarrhea--- no emesis, check stool for C. difficile GI pathogen -If C. difficile negative can use Imodium or Lomotil  3)hypokalemia and hypomagnesemia--- mostly due to #2 above replace and recheck -Prolonged QT syndrome noted  4) alcoholic liver cirrhosis with ascites--- defer to paracentesis until more hemodynamically stable -Patient states he quit drinking back in  November 2021 -Continue multivitamin -Continue IV Rocephin for SBP prophylaxis  5) heme positive stools in the setting of diarrhea----IV Protonix, GI consult requested May need endoluminal evaluation  6) chronic normocytic anemia--- please see #5 above  7) tobacco abuse and COPD--- nicotine patch and bronchodilators-ordered, patient not interested in smoking cessation  8)HTN--hold antihypertensives until more hemodynamically stable especially given GI losses/volume depletion with diarrhea  9)DM2-A1c 5.4, patient is high risk for hypoglycemic episodes, especially given diarrhea -Give nutritional supplements  10)-Patient with orthostatic dizziness and unsteady gait--- concerns about hemodynamic instability, continue midodrine -Get PT eval  Disposition/Need for in-Hospital Stay- patient unable to be discharged at this time due to --AKI requiring IV fluids, heme positive stool may require endoluminal evaluation, ascites requiring Rocephin IV for SBP prophylaxis  Status is: Inpatient  Remains inpatient appropriate because:AKI requiring IV fluids, heme positive stool may require endoluminal evaluation, ascites requiring Rocephin IV for SBP prophylaxis -Patient with orthostatic dizziness and unsteady gait,  Disposition: The patient is from: Home              Anticipated d/c is to: Home              Anticipated d/c date is: 2 days              Patient currently is not medically stable to d/c. Barriers: Not Clinically Stable-   Code Status :  -  Code Status: Full Code   Family Communication:   (patient is alert, awake and coherent)   Consults  :  Gi  DVT Prophylaxis  :   - SCDs   SCDs Start: 01/18/20 2120    Lab Results  Component Value Date   PLT 178 01/19/2020    Inpatient Medications  Scheduled Meds:  midodrine  7.5 mg Oral TID WC   nicotine  7 mg Transdermal Daily   pantoprazole (PROTONIX) IV  40 mg Intravenous Q12H   pentoxifylline  400 mg Oral TID WC    Continuous Infusions:  sodium chloride 75 mL/hr at 01/19/20 1017   albumin human Stopped (01/19/20 1725)   cefTRIAXone (ROCEPHIN)  IV Stopped (01/19/20 0152)   octreotide  (SANDOSTATIN)    IV infusion 50 mcg/hr (01/19/20 1327)   PRN Meds:.prochlorperazine    Anti-infectives (From admission, onward)   Start     Dose/Rate Route Frequency Ordered Stop   01/18/20 2315  cefTRIAXone (ROCEPHIN) 2 g in sodium chloride 0.9 % 100 mL IVPB       Note to Pharmacy: Unable to use oral antibiotic that we will not increase QT interval.   2 g 200 mL/hr over 30 Minutes Intravenous Every 24 hours 01/18/20 2302          Objective:   Vitals:   01/19/20 1500 01/19/20 1515 01/19/20 1530 01/19/20 1700  BP: 122/70  134/84 126/71  Pulse: 80 87 88 80  Resp: 18 18 (!) 22 19  Temp:      TempSrc:      SpO2: 98% 97% 98% 98%  Weight:      Height:        Wt Readings from Last 3 Encounters:  01/18/20 80.7 kg  06/29/19 93 kg  06/08/19 98.7 kg     Intake/Output Summary (Last 24 hours) at 01/19/2020 1828 Last data filed at 01/19/2020 1725 Gross per 24 hour  Intake 2150 ml  Output --  Net 2150 ml     Physical Exam  Gen:- Awake Alert,  In no apparent distress  HEENT:- [place].AT, No sclera icterus Neck-Supple Neck,No JVD,.  Lungs-  CTAB , fair symmetrical air movement CV- S1, S2 normal, regular  Abd-  +ve B.Sounds, Abd Soft, ascites noted, not particularly tender Extremity/Skin:- No  edema, pedal pulses present  Psych-affect is appropriate, oriented x3 Neuro--Patient with orthostatic dizziness and unsteady gait,   Data Review:   Micro Results Recent Results (from the past 240 hour(s))  Resp Panel by RT-PCR (Flu A&B, Covid) Nasopharyngeal Swab     Status: None   Collection Time: 01/18/20  8:27 PM   Specimen: Nasopharyngeal Swab; Nasopharyngeal(NP) swabs in vial transport medium  Result Value Ref Range Status   SARS Coronavirus 2 by RT PCR NEGATIVE NEGATIVE Final    Comment:  (NOTE) SARS-CoV-2 target nucleic acids are NOT DETECTED.  The SARS-CoV-2 RNA is generally detectable in upper respiratory specimens during the acute phase of infection. The lowest concentration of SARS-CoV-2 viral copies this assay can detect is 138 copies/mL. A negative result does not preclude SARS-Cov-2 infection and should not be used as the sole basis for treatment or other patient management decisions. A negative result may occur with  improper specimen collection/handling, submission of specimen other than nasopharyngeal swab, presence of viral mutation(s) within the areas targeted by this assay, and inadequate number of viral copies(<138 copies/mL). A negative result must be combined with clinical observations, patient history, and epidemiological information. The expected result is Negative.  Fact Sheet for Patients:  EntrepreneurPulse.com.au  Fact Sheet for Healthcare Providers:  IncredibleEmployment.be  This test is no t yet approved or cleared by the [place] FDA and  has been authorized for detection and/or diagnosis of SARS-CoV-2 by FDA under an Emergency Use Authorization (EUA). This EUA will remain  in effect (meaning this test can be used) for the duration of the COVID-19 declaration under Section 564(b)(1) of the Act, 21 U.S.C.section 360bbb-3(b)(1), unless the authorization is terminated  or revoked sooner.       Influenza A by PCR NEGATIVE NEGATIVE Final   Influenza B by PCR NEGATIVE NEGATIVE Final    Comment: (NOTE) The Xpert Xpress SARS-CoV-2/FLU/RSV plus assay is intended as an aid in the diagnosis of influenza from Nasopharyngeal swab specimens and should not be used as a sole basis for treatment. Nasal washings and aspirates are unacceptable for Xpert Xpress SARS-CoV-2/FLU/RSV testing.  Fact Sheet for Patients: EntrepreneurPulse.com.au  Fact Sheet for Healthcare  Providers: IncredibleEmployment.be  This test is not yet approved or cleared by the [place] FDA and has been authorized for detection and/or diagnosis of SARS-CoV-2 by FDA under an Emergency Use Authorization (EUA). This EUA will remain in effect (meaning this test can be used) for the duration of the COVID-19 declaration under Section 564(b)(1) of the Act, 21 U.S.C. section 360bbb-3(b)(1), unless the authorization is terminated or revoked.  Performed at [facility], [address] [place], [place] [address]     Radiology Reports CT Chest W Contrast  Result Date: 01/18/2020 CLINICAL DATA:  Weakness, weight loss, abdominal distension EXAM: CT CHEST, ABDOMEN, AND PELVIS WITH CONTRAST TECHNIQUE: Multidetector CT imaging of the chest, abdomen and pelvis was performed following the standard protocol during bolus administration of intravenous contrast. CONTRAST:  39mL OMNIPAQUE IOHEXOL 300 MG/ML  SOLN COMPARISON:  06/13/2019 FINDINGS: CT CHEST FINDINGS Cardiovascular: The heart is unremarkable without pericardial effusion. Mild atherosclerosis of the aorta without thoracic aortic aneurysm or dissection. Mediastinum/Nodes: No enlarged mediastinal, hilar, or axillary lymph nodes. Thyroid gland, trachea, and esophagus demonstrate no significant findings. Lungs/Pleura: Trace left pleural effusion. No acute airspace disease or pneumothorax. The central airways are patent. Musculoskeletal: No acute or destructive bony lesions. Reconstructed images demonstrate no additional findings. CT ABDOMEN PELVIS FINDINGS Hepatobiliary: Diffuse heterogeneity of the liver is noted. Overall, increased enhancement since prior study where diffuse hepatic steatosis was noted. Findings could reflect underlying hepatitis, please correlate with liver function tests. Gallbladder is unremarkable. Pancreas: Diffuse pancreatic atrophy unchanged. Spleen: Normal in size without focal abnormality.  Adrenals/Urinary Tract: Multiple right renal cortical cysts unchanged. Otherwise the kidneys enhance normally. No urinary tract calculi or obstruction. Bladder is decompressed, which limits its evaluation. The adrenals are normal. Stomach/Bowel: No bowel obstruction or ileus. Mild wall thickening of the centralized small bowel loops is likely due to underlying liver disease and diffuse ascites. Vascular/Lymphatic: Splenic vein, SMV, and portal vein are patent. Small esophageal and gastric varices are seen within the upper abdomen, as well as recanalization of the umbilical vein. Findings are consistent with portal venous hypertension. Minimal atherosclerosis of the distal aorta. No pathologic adenopathy. Reproductive: Prostate is unremarkable. Other: Large volume ascites is noted. Ascites extends into a right inguinal hernia. No bowel herniation. No free intraperitoneal gas. Musculoskeletal: No acute or destructive bony lesions. Reconstructed images demonstrate no additional findings. IMPRESSION: 1. Diffuse heterogeneity of the liver parenchyma consistent with a combination of hepatitis and underlying hepatic steatosis. 2. Large volume ascites. 3. Trace left pleural effusion. 4. Small gastric  and esophageal varices. 5. Right inguinal hernia containing peritoneal fat and ascites. No bowel herniation. Electronically Signed   By: Randa Ngo M.D.   On: 01/18/2020 19:51   CT ABDOMEN PELVIS W CONTRAST  Result Date: 01/18/2020 CLINICAL DATA:  Weakness, weight loss, abdominal distension EXAM: CT CHEST, ABDOMEN, AND PELVIS WITH CONTRAST TECHNIQUE: Multidetector CT imaging of the chest, abdomen and pelvis was performed following the standard protocol during bolus administration of intravenous contrast. CONTRAST:  33mL OMNIPAQUE IOHEXOL 300 MG/ML  SOLN COMPARISON:  06/13/2019 FINDINGS: CT CHEST FINDINGS Cardiovascular: The heart is unremarkable without pericardial effusion. Mild atherosclerosis of the aorta without  thoracic aortic aneurysm or dissection. Mediastinum/Nodes: No enlarged mediastinal, hilar, or axillary lymph nodes. Thyroid gland, trachea, and esophagus demonstrate no significant findings. Lungs/Pleura: Trace left pleural effusion. No acute airspace disease or pneumothorax. The central airways are patent. Musculoskeletal: No acute or destructive bony lesions. Reconstructed images demonstrate no additional findings. CT ABDOMEN PELVIS FINDINGS Hepatobiliary: Diffuse heterogeneity of the liver is noted. Overall, increased enhancement since prior study where diffuse hepatic steatosis was noted. Findings could reflect underlying hepatitis, please correlate with liver function tests. Gallbladder is unremarkable. Pancreas: Diffuse pancreatic atrophy unchanged. Spleen: Normal in size without focal abnormality. Adrenals/Urinary Tract: Multiple right renal cortical cysts unchanged. Otherwise the kidneys enhance normally. No urinary tract calculi or obstruction. Bladder is decompressed, which limits its evaluation. The adrenals are normal. Stomach/Bowel: No bowel obstruction or ileus. Mild wall thickening of the centralized small bowel loops is likely due to underlying liver disease and diffuse ascites. Vascular/Lymphatic: Splenic vein, SMV, and portal vein are patent. Small esophageal and gastric varices are seen within the upper abdomen, as well as recanalization of the umbilical vein. Findings are consistent with portal venous hypertension. Minimal atherosclerosis of the distal aorta. No pathologic adenopathy. Reproductive: Prostate is unremarkable. Other: Large volume ascites is noted. Ascites extends into a right inguinal hernia. No bowel herniation. No free intraperitoneal gas. Musculoskeletal: No acute or destructive bony lesions. Reconstructed images demonstrate no additional findings. IMPRESSION: 1. Diffuse heterogeneity of the liver parenchyma consistent with a combination of hepatitis and underlying hepatic  steatosis. 2. Large volume ascites. 3. Trace left pleural effusion. 4. Small gastric and esophageal varices. 5. Right inguinal hernia containing peritoneal fat and ascites. No bowel herniation. Electronically Signed   By: Randa Ngo M.D.   On: 01/18/2020 19:51   DG Chest Port 1 View  Result Date: 01/18/2020 CLINICAL DATA:  69 year old male with tachycardia. EXAM: PORTABLE CHEST 1 VIEW COMPARISON:  Chest radiograph dated 06/04/2019. FINDINGS: Shallow inspiration. No focal consolidation, pleural effusion, pneumothorax. The cardiac silhouette is within limits. No acute osseous pathology. IMPRESSION: No active disease. Electronically Signed   By: Anner Crete M.D.   On: 01/18/2020 17:25     CBC Recent Labs  Lab 01/18/20 1722 01/19/20 0534  WBC 9.2 5.9  HGB 10.3* 8.3*  HCT 30.7* 24.6*  PLT 226 178  MCV 97.5 96.5  MCH 32.7 32.5  MCHC 33.6 33.7  RDW 15.4 15.4    Chemistries  Recent Labs  Lab 01/18/20 1722 01/19/20 0534  NA 134* 136  K 4.0 2.8*  CL 106 107  CO2 18* 18*  GLUCOSE 158* 106*  BUN 27* 24*  CREATININE 2.00* 1.73*  CALCIUM 7.8* 8.0*  MG 0.4*  --   AST 72* 44*  ALT 28 22  ALKPHOS 125 86  BILITOT 3.3* 3.2*   ------------------------------------------------------------------------------------------------------------------ No results for input(s): CHOL, HDL, LDLCALC, TRIG, CHOLHDL, LDLDIRECT in the  last 72 hours.  Lab Results  Component Value Date   HGBA1C 4.4 (L) 01/19/2020   ------------------------------------------------------------------------------------------------------------------ No results for input(s): TSH, T4TOTAL, T3FREE, THYROIDAB in the last 72 hours.  Invalid input(s): FREET3 ------------------------------------------------------------------------------------------------------------------ Recent Labs    01/19/20 0534  VITAMINB12 1,353*  FOLATE 6.3  FERRITIN 454*  TIBC NOT CALCULATED  IRON 50  RETICCTPCT 2.6    Coagulation  profile Recent Labs  Lab 01/18/20 2133  INR 1.7*    No results for input(s): DDIMER in the last 72 hours.  Cardiac Enzymes No results for input(s): CKMB, TROPONINI, MYOGLOBIN in the last 168 hours.  Invalid input(s): CK ------------------------------------------------------------------------------------------------------------------    Component Value Date/Time   BNP 238.0 (H) 06/05/2019 2300     Roxan Hockey M.D on 01/19/2020 at 6:28 PM  Go to [email or web address] - for contact info  [facility] - Office  [phone]

## 2020-01-19 NOTE — ED Notes (Addendum)
Bladder scan performed.  Result is >609.  Straight catheterization performed yielding 100 mL concentrated, blood tinged urine with small clots.  Bladder scanner also showing volume in other areas of the abdomen inconsistent with location of bladder.

## 2020-01-19 NOTE — ED Notes (Signed)
Report given to oncoming nurse.

## 2020-01-19 NOTE — ED Notes (Signed)
Date and time results received: 01/19/20 0005 (use smartphrase ".now" to insert current time)  Test: Magnesium Critical Value: 0.4  Name of Provider Notified: Olevia Bowens, MD  Orders Received? Or Actions Taken?:

## 2020-01-19 NOTE — ED Notes (Signed)
Pt assisted up out of bed and ambulated to restroom.  Pt educated on making staff aware when needs to use the restroom.  Pt verbalized understanding.  Pt very weak and needed a standby assist.

## 2020-01-20 ENCOUNTER — Inpatient Hospital Stay (HOSPITAL_COMMUNITY): Payer: PPO

## 2020-01-20 ENCOUNTER — Encounter (HOSPITAL_COMMUNITY): Payer: Self-pay | Admitting: Family Medicine

## 2020-01-20 DIAGNOSIS — N179 Acute kidney failure, unspecified: Principal | ICD-10-CM

## 2020-01-20 DIAGNOSIS — K759 Inflammatory liver disease, unspecified: Secondary | ICD-10-CM

## 2020-01-20 DIAGNOSIS — N182 Chronic kidney disease, stage 2 (mild): Secondary | ICD-10-CM

## 2020-01-20 DIAGNOSIS — R188 Other ascites: Secondary | ICD-10-CM

## 2020-01-20 DIAGNOSIS — R531 Weakness: Secondary | ICD-10-CM

## 2020-01-20 DIAGNOSIS — E1122 Type 2 diabetes mellitus with diabetic chronic kidney disease: Secondary | ICD-10-CM | POA: Diagnosis not present

## 2020-01-20 DIAGNOSIS — F1010 Alcohol abuse, uncomplicated: Secondary | ICD-10-CM | POA: Diagnosis not present

## 2020-01-20 LAB — CBC
HCT: 21.1 % — ABNORMAL LOW (ref 39.0–52.0)
Hemoglobin: 7.2 g/dL — ABNORMAL LOW (ref 13.0–17.0)
MCH: 33 pg (ref 26.0–34.0)
MCHC: 34.1 g/dL (ref 30.0–36.0)
MCV: 96.8 fL (ref 80.0–100.0)
Platelets: 129 10*3/uL — ABNORMAL LOW (ref 150–400)
RBC: 2.18 MIL/uL — ABNORMAL LOW (ref 4.22–5.81)
RDW: 15.3 % (ref 11.5–15.5)
WBC: 5.3 10*3/uL (ref 4.0–10.5)
nRBC: 0 % (ref 0.0–0.2)

## 2020-01-20 LAB — RENAL FUNCTION PANEL
Albumin: 3.9 g/dL (ref 3.5–5.0)
Anion gap: 10 (ref 5–15)
BUN: 21 mg/dL (ref 8–23)
CO2: 16 mmol/L — ABNORMAL LOW (ref 22–32)
Calcium: 8.4 mg/dL — ABNORMAL LOW (ref 8.9–10.3)
Chloride: 111 mmol/L (ref 98–111)
Creatinine, Ser: 1.6 mg/dL — ABNORMAL HIGH (ref 0.61–1.24)
GFR, Estimated: 47 mL/min — ABNORMAL LOW (ref >60)
Glucose, Bld: 172 mg/dL — ABNORMAL HIGH (ref 70–99)
Phosphorus: 2.9 mg/dL (ref 2.5–4.6)
Potassium: 3.7 mmol/L (ref 3.5–5.1)
Sodium: 137 mmol/L (ref 135–145)

## 2020-01-20 LAB — GLUCOSE, CAPILLARY
Glucose-Capillary: 127 mg/dL — ABNORMAL HIGH (ref 70–99)
Glucose-Capillary: 177 mg/dL — ABNORMAL HIGH (ref 70–99)

## 2020-01-20 LAB — HEPATITIS PANEL, ACUTE
HCV Ab: NONREACTIVE
Hep A IgM: REACTIVE — AB
Hep B C IgM: NONREACTIVE
Hepatitis B Surface Ag: NONREACTIVE

## 2020-01-20 LAB — HEMOGLOBIN AND HEMATOCRIT, BLOOD
HCT: 24.3 % — ABNORMAL LOW (ref 39.0–52.0)
Hemoglobin: 8.3 g/dL — ABNORMAL LOW (ref 13.0–17.0)

## 2020-01-20 LAB — PREPARE RBC (CROSSMATCH)

## 2020-01-20 LAB — MAGNESIUM: Magnesium: 2.1 mg/dL (ref 1.7–2.4)

## 2020-01-20 MED ORDER — OCTREOTIDE ACETATE 500 MCG/ML IJ SOLN
INTRAMUSCULAR | Status: AC
  Filled 2020-01-20: qty 1

## 2020-01-20 MED ORDER — SODIUM CHLORIDE 0.9% IV SOLUTION: Freq: Once | INTRAVENOUS | Status: AC

## 2020-01-20 MED ORDER — FUROSEMIDE 10 MG/ML IJ SOLN
40.0000 mg | Freq: Once | INTRAMUSCULAR | Status: AC
  Administered 2020-01-20: 40 mg via INTRAVENOUS

## 2020-01-20 MED ORDER — PEG 3350-KCL-NA BICARB-NACL 420 G PO SOLR
2000.0000 mL | Freq: Once | ORAL | Status: AC
  Administered 2020-01-20: 2000 mL via ORAL

## 2020-01-20 MED ORDER — PEG 3350-KCL-NA BICARB-NACL 420 G PO SOLR: 2000.0000 mL | Freq: Once | ORAL | Status: DC

## 2020-01-20 NOTE — Progress Notes (Signed)
Patient Demographics:    Darren Howard, is a 69 y.o. male, DOB - 04-01-1950, JJH:[id number]  Admit date - 01/18/2020   Admitting Physician Cherie Lasalle Denton Brick, MD  Outpatient Primary MD for the patient is [name], MD  LOS - 1   Chief Complaint  Patient presents with  . Weakness        Subjective:    Darren Howard today has no fevers, no emesis,  No chest pain,   - This am pt had large volume hematochezia noted as well. -- feels there is a large hemorrhoid possibly in his rectal area.,  Assessment  & Plan :    Principal Problem:   AKI (acute kidney injury) ([place]) Active Problems:   Hypomagnesemia   Hypertension   Normocytic anemia   DM2 (diabetes mellitus, type 2) (HCC)   Alcohol abuse   Tobacco use   HLD (hyperlipidemia)   Hepatorenal syndrome (HCC)   Prolonged QT interval   Abdominal ascites   Generalized weakness   Hepatitis  Brief Summary:-  69 y.o. male with medical history significant of alcohol abuse, tobacco abuse, microcytic anemia, type II DM, history of upper GI bleed, duodenal erosion, erosive gastropathy, hyperlipidemia, hypertension, noncompliance of medications admitted with concerns for AKI in the setting of diarrhea and rectal bleeding  A/p 1)AKI----acute kidney injury suspect due to dehydration in setting of GI losses with diarrhea and possibly some component of hepatorenal syndrome-   creatinine on admission=2.0  , baseline creatinine = 0.8   , creatinine is now= improved to 1.60 -Continue IV octreotide ---renally adjust medications, avoid nephrotoxic agents / dehydration  / hypotension  2) persistent diarrhea- large volume hematochezia noted as well. -- feels there is a large hemorrhoid possibly in his rectal area.,--- no emesis, check stool for C. difficile GI pathogen -If C. difficile negative can use Imodium or Lomotil -GI consult requested for possible  colonoscopy especially given the patient's attempted colonoscopy back in April 2021 was poor quality prep  3)hypokalemia and hypomagnesemia--- mostly due to #2 above replace and recheck -Prolonged QT syndrome noted  4) alcoholic liver cirrhosis with ascites---  ---Patient states he quit drinking back in November 2021 -Continue multivitamin -Continue IV Rocephin for SBP prophylaxis -Okay to proceed with palliative/therapeutic paracentesis on 01/20/20  5) acute GI bleed heme positive stools in the setting of diarrhea---- had large volume hematochezia noted as well. -- feels there is a large hemorrhoid possibly in his rectal area., -GI consult requested for possible colonoscopy especially given the patient's attempted colonoscopy back in April 2021 was poor quality prep -c/n IV Protonix,   6) acute on chronic normocytic anemia--- hemoglobin dropping due to GI bleed/acute blood loss anemia -Hemoglobin is down to 7.2, it was 10.3 on 01/18/2020 -We will transfuse 1 unit of PRBC -Please see #5 above  7) tobacco abuse and COPD--- nicotine patch and bronchodilators-ordered, patient not interested in smoking cessation  8)HTN--hold antihypertensives until more hemodynamically stable especially given GI losses/volume depletion with diarrhea  9)DM2-A1c 5.4, patient is high risk for hypoglycemic episodes, especially given diarrhea -Give nutritional supplements  10)-Patient with orthostatic dizziness and unsteady gait--- concerns about hemodynamic instability, continue midodrine -Get PT eval  Disposition/Need for in-Hospital Stay- patient unable to be discharged at this time  due to --AKI requiring IV fluids, heme positive stool may require endoluminal evaluation, ascites requiring Rocephin IV for SBP prophylaxis  Status is: Inpatient  Remains inpatient appropriate because:AKI requiring IV fluids, heme positive stool may require endoluminal evaluation, ascites requiring Rocephin IV for SBP  prophylaxis -Patient with orthostatic dizziness and unsteady gait,  Disposition: The patient is from: Home              Anticipated d/c is to: Home              Anticipated d/c date is: 2 days              Patient currently is not medically stable to d/c. Barriers: Not Clinically Stable-   Code Status :  -  Code Status: Full Code   Family Communication:   (patient is alert, awake and coherent)   Consults  :  Gi  DVT Prophylaxis  :   - SCDs   SCDs Start: 01/18/20 2120   Lab Results  Component Value Date   PLT 129 (L) 01/20/2020    Inpatient Medications  Scheduled Meds: . folic acid  1 mg Oral Daily  . midodrine  7.5 mg Oral TID WC  . nicotine  14 mg Transdermal Daily  . pantoprazole (PROTONIX) IV  40 mg Intravenous Q12H  . pentoxifylline  400 mg Oral TID WC  . thiamine  100 mg Oral Daily   Continuous Infusions: . sodium chloride 75 mL/hr at 01/19/20 1017  . cefTRIAXone (ROCEPHIN)  IV Stopped (01/19/20 2241)  . dextrose 5 % and 0.45 % NaCl with KCl 20 mEq/L 75 mL/hr at 01/19/20 2030  . octreotide  (SANDOSTATIN)    IV infusion 50 mcg/hr (01/20/20 0626)   PRN Meds:.albuterol, prochlorperazine    Anti-infectives (From admission, onward)   Start     Dose/Rate Route Frequency Ordered Stop   01/18/20 2315  cefTRIAXone (ROCEPHIN) 2 g in sodium chloride 0.9 % 100 mL IVPB       Note to Pharmacy: Unable to use oral antibiotic that we will not increase QT interval.   2 g 200 mL/hr over 30 Minutes Intravenous Every 24 hours 01/18/20 2302          Objective:   Vitals:   01/19/20 2010 01/19/20 2034 01/20/20 0032 01/20/20 0454  BP: 139/83  127/79 118/69  Pulse: 83  76 77  Resp: 17  16 16   Temp: (!) 97.2 F (36.2 C)  (!) 97.5 F (36.4 C) (!) 97.5 F (36.4 C)  TempSrc:      SpO2: 100% 99% 100% 100%  Weight:      Height:        Wt Readings from Last 3 Encounters:  01/18/20 80.7 kg  06/29/19 93 kg  06/08/19 98.7 kg     Intake/Output Summary (Last 24 hours) at  01/20/2020 1129 Last data filed at 01/20/2020 0900 Gross per 24 hour  Intake 1841.89 ml  Output --  Net 1841.89 ml     Physical Exam  Gen:- Awake Alert,  In no apparent distress  HEENT:- [place].AT, No sclera icterus Neck-Supple Neck,No JVD,.  Lungs-  CTAB , fair symmetrical air movement CV- S1, S2 normal, regular  Abd-  +ve B.Sounds, Abd Soft, ascites noted, not particularly tender Extremity/Skin:- No  edema, pedal pulses present  Psych-affect is appropriate, oriented x3 Neuro--generalized weakness without new acute deficits Rectal Exam--- Per GI provider---Mass-like area protruding from the rectum appears consistent with large external hemorrhoid or protruding internal hemorrhoid;  non-obstructive but irregular, nodular rectal vault..     Data Review:   Micro Results Recent Results (from the past 240 hour(s))  Resp Panel by RT-PCR (Flu A&B, Covid) Nasopharyngeal Swab     Status: None   Collection Time: 01/18/20  8:27 PM   Specimen: Nasopharyngeal Swab; Nasopharyngeal(NP) swabs in vial transport medium  Result Value Ref Range Status   SARS Coronavirus 2 by RT PCR NEGATIVE NEGATIVE Final    Comment: (NOTE) SARS-CoV-2 target nucleic acids are NOT DETECTED.  The SARS-CoV-2 RNA is generally detectable in upper respiratory specimens during the acute phase of infection. The lowest concentration of SARS-CoV-2 viral copies this assay can detect is 138 copies/mL. A negative result does not preclude SARS-Cov-2 infection and should not be used as the sole basis for treatment or other patient management decisions. A negative result may occur with  improper specimen collection/handling, submission of specimen other than nasopharyngeal swab, presence of viral mutation(s) within the areas targeted by this assay, and inadequate number of viral copies(<138 copies/mL). A negative result must be combined with clinical observations, patient history, and epidemiological information. The expected  result is Negative.  Fact Sheet for Patients:  EntrepreneurPulse.com.au  Fact Sheet for Healthcare Providers:  IncredibleEmployment.be  This test is no t yet approved or cleared by the [place] FDA and  has been authorized for detection and/or diagnosis of SARS-CoV-2 by FDA under an Emergency Use Authorization (EUA). This EUA will remain  in effect (meaning this test can be used) for the duration of the COVID-19 declaration under Section 564(b)(1) of the Act, 21 U.S.C.section 360bbb-3(b)(1), unless the authorization is terminated  or revoked sooner.       Influenza A by PCR NEGATIVE NEGATIVE Final   Influenza B by PCR NEGATIVE NEGATIVE Final    Comment: (NOTE) The Xpert Xpress SARS-CoV-2/FLU/RSV plus assay is intended as an aid in the diagnosis of influenza from Nasopharyngeal swab specimens and should not be used as a sole basis for treatment. Nasal washings and aspirates are unacceptable for Xpert Xpress SARS-CoV-2/FLU/RSV testing.  Fact Sheet for Patients: EntrepreneurPulse.com.au  Fact Sheet for Healthcare Providers: IncredibleEmployment.be  This test is not yet approved or cleared by the [place] FDA and has been authorized for detection and/or diagnosis of SARS-CoV-2 by FDA under an Emergency Use Authorization (EUA). This EUA will remain in effect (meaning this test can be used) for the duration of the COVID-19 declaration under Section 564(b)(1) of the Act, 21 U.S.C. section 360bbb-3(b)(1), unless the authorization is terminated or revoked.  Performed at [facility], [address] [place],  [address]     Radiology Reports CT Chest W Contrast  Result Date: 01/18/2020 CLINICAL DATA:  Weakness, weight loss, abdominal distension EXAM: CT CHEST, ABDOMEN, AND PELVIS WITH CONTRAST TECHNIQUE: Multidetector CT imaging of the chest, abdomen and pelvis was performed following the  standard protocol during bolus administration of intravenous contrast. CONTRAST:  79mL OMNIPAQUE IOHEXOL 300 MG/ML  SOLN COMPARISON:  06/13/2019 FINDINGS: CT CHEST FINDINGS Cardiovascular: The heart is unremarkable without pericardial effusion. Mild atherosclerosis of the aorta without thoracic aortic aneurysm or dissection. Mediastinum/Nodes: No enlarged mediastinal, hilar, or axillary lymph nodes. Thyroid gland, trachea, and esophagus demonstrate no significant findings. Lungs/Pleura: Trace left pleural effusion. No acute airspace disease or pneumothorax. The central airways are patent. Musculoskeletal: No acute or destructive bony lesions. Reconstructed images demonstrate no additional findings. CT ABDOMEN PELVIS FINDINGS Hepatobiliary: Diffuse heterogeneity of the liver is noted. Overall, increased enhancement since prior study where  diffuse hepatic steatosis was noted. Findings could reflect underlying hepatitis, please correlate with liver function tests. Gallbladder is unremarkable. Pancreas: Diffuse pancreatic atrophy unchanged. Spleen: Normal in size without focal abnormality. Adrenals/Urinary Tract: Multiple right renal cortical cysts unchanged. Otherwise the kidneys enhance normally. No urinary tract calculi or obstruction. Bladder is decompressed, which limits its evaluation. The adrenals are normal. Stomach/Bowel: No bowel obstruction or ileus. Mild wall thickening of the centralized small bowel loops is likely due to underlying liver disease and diffuse ascites. Vascular/Lymphatic: Splenic vein, SMV, and portal vein are patent. Small esophageal and gastric varices are seen within the upper abdomen, as well as recanalization of the umbilical vein. Findings are consistent with portal venous hypertension. Minimal atherosclerosis of the distal aorta. No pathologic adenopathy. Reproductive: Prostate is unremarkable. Other: Large volume ascites is noted. Ascites extends into a right inguinal hernia. No bowel  herniation. No free intraperitoneal gas. Musculoskeletal: No acute or destructive bony lesions. Reconstructed images demonstrate no additional findings. IMPRESSION: 1. Diffuse heterogeneity of the liver parenchyma consistent with a combination of hepatitis and underlying hepatic steatosis. 2. Large volume ascites. 3. Trace left pleural effusion. 4. Small gastric and esophageal varices. 5. Right inguinal hernia containing peritoneal fat and ascites. No bowel herniation. Electronically Signed   By: Randa Ngo M.D.   On: 01/18/2020 19:51   CT ABDOMEN PELVIS W CONTRAST  Result Date: 01/18/2020 CLINICAL DATA:  Weakness, weight loss, abdominal distension EXAM: CT CHEST, ABDOMEN, AND PELVIS WITH CONTRAST TECHNIQUE: Multidetector CT imaging of the chest, abdomen and pelvis was performed following the standard protocol during bolus administration of intravenous contrast. CONTRAST:  69mL OMNIPAQUE IOHEXOL 300 MG/ML  SOLN COMPARISON:  06/13/2019 FINDINGS: CT CHEST FINDINGS Cardiovascular: The heart is unremarkable without pericardial effusion. Mild atherosclerosis of the aorta without thoracic aortic aneurysm or dissection. Mediastinum/Nodes: No enlarged mediastinal, hilar, or axillary lymph nodes. Thyroid gland, trachea, and esophagus demonstrate no significant findings. Lungs/Pleura: Trace left pleural effusion. No acute airspace disease or pneumothorax. The central airways are patent. Musculoskeletal: No acute or destructive bony lesions. Reconstructed images demonstrate no additional findings. CT ABDOMEN PELVIS FINDINGS Hepatobiliary: Diffuse heterogeneity of the liver is noted. Overall, increased enhancement since prior study where diffuse hepatic steatosis was noted. Findings could reflect underlying hepatitis, please correlate with liver function tests. Gallbladder is unremarkable. Pancreas: Diffuse pancreatic atrophy unchanged. Spleen: Normal in size without focal abnormality. Adrenals/Urinary Tract: Multiple  right renal cortical cysts unchanged. Otherwise the kidneys enhance normally. No urinary tract calculi or obstruction. Bladder is decompressed, which limits its evaluation. The adrenals are normal. Stomach/Bowel: No bowel obstruction or ileus. Mild wall thickening of the centralized small bowel loops is likely due to underlying liver disease and diffuse ascites. Vascular/Lymphatic: Splenic vein, SMV, and portal vein are patent. Small esophageal and gastric varices are seen within the upper abdomen, as well as recanalization of the umbilical vein. Findings are consistent with portal venous hypertension. Minimal atherosclerosis of the distal aorta. No pathologic adenopathy. Reproductive: Prostate is unremarkable. Other: Large volume ascites is noted. Ascites extends into a right inguinal hernia. No bowel herniation. No free intraperitoneal gas. Musculoskeletal: No acute or destructive bony lesions. Reconstructed images demonstrate no additional findings. IMPRESSION: 1. Diffuse heterogeneity of the liver parenchyma consistent with a combination of hepatitis and underlying hepatic steatosis. 2. Large volume ascites. 3. Trace left pleural effusion. 4. Small gastric and esophageal varices. 5. Right inguinal hernia containing peritoneal fat and ascites. No bowel herniation. Electronically Signed   By: Diana Eves.D.  On: 01/18/2020 19:51   DG Chest Port 1 View  Result Date: 01/18/2020 CLINICAL DATA:  69 year old male with tachycardia. EXAM: PORTABLE CHEST 1 VIEW COMPARISON:  Chest radiograph dated 06/04/2019. FINDINGS: Shallow inspiration. No focal consolidation, pleural effusion, pneumothorax. The cardiac silhouette is within limits. No acute osseous pathology. IMPRESSION: No active disease. Electronically Signed   By: Anner Crete M.D.   On: 01/18/2020 17:25     CBC Recent Labs  Lab 01/18/20 1722 01/19/20 0534 01/20/20 0439  WBC 9.2 5.9 5.3  HGB 10.3* 8.3* 7.2*  HCT 30.7* 24.6* 21.1*  PLT 226  178 129*  MCV 97.5 96.5 96.8  MCH 32.7 32.5 33.0  MCHC 33.6 33.7 34.1  RDW 15.4 15.4 15.3    Chemistries  Recent Labs  Lab 01/18/20 1722 01/19/20 0534 01/20/20 0439  NA 134* 136 137  K 4.0 2.8* 3.7  CL 106 107 111  CO2 18* 18* 16*  GLUCOSE 158* 106* 172*  BUN 27* 24* 21  CREATININE 2.00* 1.73* 1.60*  CALCIUM 7.8* 8.0* 8.4*  MG 0.4*  --  2.1  AST 72* 44*  --   ALT 28 22  --   ALKPHOS 125 86  --   BILITOT 3.3* 3.2*  --    ------------------------------------------------------------------------------------------------------------------ No results for input(s): CHOL, HDL, LDLCALC, TRIG, CHOLHDL, LDLDIRECT in the last 72 hours.  Lab Results  Component Value Date   HGBA1C 4.4 (L) 01/19/2020   ------------------------------------------------------------------------------------------------------------------ No results for input(s): TSH, T4TOTAL, T3FREE, THYROIDAB in the last 72 hours.  Invalid input(s): FREET3 ------------------------------------------------------------------------------------------------------------------ Recent Labs    01/19/20 0534  VITAMINB12 1,353*  FOLATE 6.3  FERRITIN 454*  TIBC NOT CALCULATED  IRON 50  RETICCTPCT 2.6    Coagulation profile Recent Labs  Lab 01/18/20 2133  INR 1.7*    No results for input(s): DDIMER in the last 72 hours.  Cardiac Enzymes No results for input(s): CKMB, TROPONINI, MYOGLOBIN in the last 168 hours.  Invalid input(s): CK ------------------------------------------------------------------------------------------------------------------    Component Value Date/Time   BNP 238.0 (H) 06/05/2019 2233     Roxan Hockey M.D on 01/20/2020 at 11:29 AM  Go to [email or web address] - for contact info  [facility] - Office  [phone]

## 2020-01-20 NOTE — Progress Notes (Signed)
Pt down via stretcher to radiology for paracentesis.

## 2020-01-20 NOTE — Sedation Documentation (Signed)
PT tolerated right sided paracentesis procedure well today and 4 Liters of clear yellow fluid removed. PT verbalized post-procedure instructions. PT transported back to inpatient unit at this time.

## 2020-01-20 NOTE — Progress Notes (Signed)
22: Called to pt's room by NT, pt found standing beside bed, holding onto lower siderail. Pt had pulled IV out of right arm and had disconnnected IV from site in left arm, IV tubings hanging from pump. Pt has dried blood noted on bed, bed linens, bed rails, computer, IV pump, bathroom door frame, light switch and walls in room and in bathroom. Pt has had large BM (soft, Type 6, brown with streaks of blood noted)  in floor bedside head of bed, with stool trail noted from bed to bathroom. Pt also had large BM in toilet with bright red blood noted in toilet. Pt has stool down both legs, on feet and on both hands. Pt states, "I gotta mess in here." Pt assisted to sit in recliner and pt's feet and legs cleaned. Wiped pt's bottom and hard mass noted in rectum/anal area with bright red blood noted with each wipe of washcloth. Pt bathed and back to bed. Bed alarm on. Reminded pt to not get OOB without calling for assistance first.  Room and floor cleaned with disinfectant and housekeeping notified for thorough cleaning. No further bleeding noted from right arm IV site. L) forearm IV site saline lock intact, flushed easily with NS flush. Sandostatin infusion restarted in this site. Unable to identify easily cannulated vein for new peripheral IV, will contact AC and/or SWOT for IV start assistance.

## 2020-01-20 NOTE — Progress Notes (Signed)
Pt back to room from paracentesis. A&O, Denies c/o at this time. VSS.

## 2020-01-20 NOTE — Procedures (Signed)
PreOperative Dx: Ascites Postoperative Dx: Ascites Procedure:   US guided paracentesis Radiologist:  Thornton Papas Anesthesia:  10 ml of1% lidocaine Specimen:  4 L of yellow ascitic fluid EBL:   < 1 ml Complications: None

## 2020-01-20 NOTE — Consult Note (Signed)
Referring Provider: [facility] Primary Care Physician:  [name], MD Primary Gastroenterologist:  Dr. Gala Romney  Date of Admission: 01/18/20 Date of Consultation: 01/20/20  Reason for Consultation:  GI bleed, cirrhosis  HPI:  Darren Howard is a 69 y.o. male with a past medical history of alcohol abuse, microcytic anemia, type 2 diabetes, history of upper GI bleed, duodenal erosions, erosive gastropathy, hyperlipidemia, hypertension, noncompliance.    He was previously admitted to the hospital 06/04/2019 at which point he noted ongoing alcohol use of "a couple shots a day" and admits he is not taking his medications because he is "too lazy."  His work-up for diarrhea found pancolitis, multifactorial with GI and ID consults recommending completion of 10 days of oral vancomycin.  Advised alcohol cessation, probiotics.  During his last hospitalization he did have DTs which resolved with treatment.  Patient last had an EGD 05/17/2018 which found normal esophagus, erosive gastropathy status post biopsy and duodenal erosions suspected NSAID gastropathy.  Surgical pathology found chronic mildly active gastritis negative for H. Pylori.  Colonoscopy up-to-date 06/15/2019 which found inadequate prep, diffusely abnormal: Especially in the sigmoid status post biopsy.  Suspected lymphocytic and eosinophilic colitis in a differential, also idiopathic myointimal hyperplasia also possible.  Unusual for traditional microscopic colitis.  Exam inadequate for polyp surveillance.  No typical findings of Crohn's disease or UC.  Surgical pathology found the polyp to be colonic mucosa with no significant pathological findings, negative for acute inflammation or other abnormalities.  This time he was referred to the emergency department by his primary care due to weakness, decreased appetite, weight loss, abdominal distention for several months and progressive worsening of this over the past few weeks.  Notes stools  occasionally soft.  Occasional dyspnea but no chest pain.  Initially tachycardic in the ED with a heart rate of 115, other vitals stable.  In the ED he was found to be heme positive, initial hemoglobin 10.3 which declined yesterday to 8.3.  CMP with hypokalemia and potassium 2.8, acute kidney injury initially at 2.0 but improved yesterday to 1.73.  Mild elevation of AST at 44 yesterday, total bilirubin elevated at 3.2.  Normal alkaline phosphatase.  Folate normal, B12 elevated.  Normal iron, ferritin.  Elevated immature reticulocyte fraction.  C. difficile quick screen and GI path panel ordered but not yet collected.  CT of the abdomen and pelvis on 01/18/2020 found diffuse heterogenicity of the liver with increased enhancement since previous finding of diffuse steatosis that could reflect underlying hepatitis and recommended correlation with LFTs.  Also noted large volume ascites, trace left pleural effusion, small gastric and esophageal varices.  Overall findings consistent with and concerning for cirrhosis.  He was admitted for acute kidney injury, diarrhea, likely alcoholic cirrhosis.  He remains on IV octreotide.  Stool studies pending.  Recommended deferring paracentesis until more hemodynamically stable.  He states he quit drinking in November 2021.  He was started on IV Rocephin for SBP prophylaxis.  He was also started on IV Protonix and GI was consulted.  Today he states he is doing okay overall.  He began having "loose stool" a couple months ago.  He has not seen any bleeding until this morning.  He had a lot of blood in the commode.  Denies associated abdominal pain, nausea/vomiting.  Denies melena.  He has had some weakness this morning.  No progressive weakness, lightheadedness, dizziness before admission.  He denies hemorrhoid symptoms.  No other overt GI complaints.  Per nursing staff the  patient had a large bowel movement consistent with [facility] 5/6, essentially pasty soft.  She did not  feel that it would be an acceptable specimen for C. difficile testing.  She also noted there is a large volume hematochezia noted as well.  She feels there is a large hemorrhoid possibly in his rectal area.  Past Medical History:  Diagnosis Date  . Alcohol abuse   . Anemia   . DM2 (diabetes mellitus, type 2) ([place]) 02/13/2014  . Duodenal erosion   . Erosive gastropathy   . GI bleed 04/2018  . Glucose intolerance (impaired glucose tolerance)   . Hyperlipemia   . Hypertension   . Noncompliance     Past Surgical History:  Procedure Laterality Date  . BIOPSY  05/17/2018   Procedure: BIOPSY;  Surgeon: Daneil Dolin, MD;  [facility];  Service: Endoscopy;;  . BIOPSY  06/15/2019   Procedure: BIOPSY;  Surgeon: Daneil Dolin, MD;  [facility];  Service: Endoscopy;;  . CARDIAC CATHETERIZATION    . COLONOSCOPY N/A 06/15/2019   Procedure: COLONOSCOPY;  Surgeon: Daneil Dolin, MD;  [facility]: [facility];  Service: Endoscopy;  Laterality: N/A;  . COLONOSCOPY WITH PROPOFOL N/A 06/01/2018   inadequate colon prep, hemorrhoids on perianal exam, two 4-6 mm polyps at hepatic flexure, one 18 mm polyp at IC valve s/p piecemeal removal (tubular adenomas). was to have surveillance colonoscopy in 6 months but did not return to the office  . ESOPHAGOGASTRODUODENOSCOPY (EGD) WITH PROPOFOL N/A 05/17/2018   Dr. Gala Romney: normal esophagus, erosive gastropathy s/p biopsy, duodenal erosions, felt to be NSAID effect  . HERNIA REPAIR    . POLYPECTOMY  06/01/2018   Procedure: POLYPECTOMY;  Surgeon: Daneil Dolin, MD;  [facility]: [facility];  Service: Endoscopy;;  colon    Prior to Admission medications   Medication Sig Start Date End Date Taking? Authorizing Provider  amLODipine (NORVASC) 5 MG tablet Take 1 tablet (5 mg total) by mouth daily. 06/21/19  Yes Barton Dubois, MD  ammonium lactate (AMLACTIN) 12 % cream Apply topically 2 (two) times daily. 12/24/19  Yes [provider]   carvedilol (COREG) 25 MG tablet Take 25 mg by mouth 2 (two) times daily.   Yes [provider]  Multiple Vitamin (MULTIVITAMIN WITH MINERALS) TABS tablet Take 1 tablet by mouth daily. 06/21/19  Yes Barton Dubois, MD  potassium chloride SA (KLOR-CON) 20 MEQ tablet Take 2 tablets (40 mEq total) by mouth daily. 06/21/19  Yes Barton Dubois, MD  atorvastatin (LIPITOR) 10 MG tablet Take 10 mg by mouth at bedtime. Patient not taking: Reported on 01/18/2020    [provider]  calcium carbonate (TUMS - DOSED IN MG ELEMENTAL CALCIUM) 500 MG chewable tablet Chew 2 tablets (400 mg of elemental calcium total) by mouth 2 (two) times daily with a meal. Patient not taking: Reported on 01/18/2020 05/30/18   Orson Eva, MD  gabapentin (NEURONTIN) 300 MG capsule Take 300 mg by mouth 3 (three) times daily. 05/20/19   [provider]  hydrocortisone (ANUSOL-HC) 2.5 % rectal cream Place rectally 3 (three) times daily. Apply to anorectum every 6 hours x 10 days Patient not taking: Reported on 01/18/2020 06/01/18   Orson Eva, MD  magnesium oxide (MAG-OX) 400 (241.3 Mg) MG tablet Take 1 tablet (400 mg total) by mouth daily. Patient not taking: Reported on 01/18/2020 06/21/19   Barton Dubois, MD  metFORMIN (GLUCOPHAGE-XR) 500 MG 24 hr tablet Take 1 tablet (500 mg  total) by mouth daily with breakfast. Patient not taking: Reported on 01/18/2020 06/20/19   Barton Dubois, MD  pantoprazole (PROTONIX) 40 MG tablet Take 1 tablet (40 mg total) by mouth daily for 30 days. Patient not taking: Reported on 01/18/2020 05/19/18 06/18/18  Heath Lark D, DO  saccharomyces boulardii (FLORASTOR) 250 MG capsule Take 1 capsule (250 mg total) by mouth 2 (two) times daily. Patient not taking: Reported on 01/18/2020 06/20/19   Barton Dubois, MD  tamsulosin (FLOMAX) 0.4 MG CAPS capsule Take 1 capsule (0.4 mg total) by mouth 2 (two) times daily. Patient not taking: Reported on 01/18/2020 06/29/19   Cleon Gustin, MD    Current  Facility-Administered Medications  Medication Dose Route Frequency Provider Last Rate Last Admin  . 0.9 %  sodium chloride infusion   Intravenous Continuous Roxan Hockey, MD 75 mL/hr at 01/19/20 1017 New Bag at 01/19/20 1017  . albuterol (VENTOLIN HFA) 108 (90 Base) MCG/ACT inhaler 2 puff  2 puff Inhalation Q4H PRN Emokpae, Courage, MD      . cefTRIAXone (ROCEPHIN) 2 g in sodium chloride 0.9 % 100 mL IVPB  2 g Intravenous Q24H Reubin Milan, MD   Stopped at 01/19/20 2241  . dextrose 5 % and 0.45 % NaCl with KCl 20 mEq/L infusion   Intravenous Continuous Roxan Hockey, MD 75 mL/hr at 01/19/20 2030 New Bag at 01/19/20 2030  . folic acid (FOLVITE) tablet 1 mg  1 mg Oral Daily Emokpae, Courage, MD   1 mg at 01/19/20 2031  . midodrine (PROAMATINE) tablet 7.5 mg  7.5 mg Oral TID WC Emokpae, Courage, MD   7.5 mg at 01/19/20 1719  . nicotine (NICODERM CQ - dosed in mg/24 hours) patch 14 mg  14 mg Transdermal Daily Emokpae, Courage, MD      . octreotide (SANDOSTATIN) 500 mcg in sodium chloride 0.9 % 250 mL (2 mcg/mL) infusion  50 mcg/hr Intravenous Continuous Reubin Milan, MD 25 mL/hr at 01/20/20 0626 50 mcg/hr at 01/20/20 0626  . pantoprazole (PROTONIX) injection 40 mg  40 mg Intravenous Q12H Emokpae, Courage, MD   40 mg at 01/19/20 2030  . pentoxifylline (TRENTAL) CR tablet 400 mg  400 mg Oral TID WC Reubin Milan, MD   400 mg at 01/19/20 0005  . prochlorperazine (COMPAZINE) injection 5 mg  5 mg Intravenous Q4H PRN Reubin Milan, MD      . thiamine tablet 100 mg  100 mg Oral Daily Denton Brick, Courage, MD   100 mg at 01/19/20 2031    Allergies as of 01/18/2020  . (No Known Allergies)    Family History  Problem Relation Age of Onset  . Congestive Heart Failure Mother   . Congestive Heart Failure Father   . Cancer Brother        unknown kind   . Colon cancer Neg Hx   . Colon polyps Neg Hx     Social History   Socioeconomic History  . Marital status: Widowed     Spouse name: Not on file  . Number of children: 4  . Years of education: Not on file  . Highest education level: Not on file  Occupational History  . Occupation: retired    Comment: [employer]  Tobacco Use  . Smoking status: Current Every Day Smoker    Packs/day: 0.50    Years: 30.00    Pack years: 15.00    Types: Cigarettes  . Smokeless tobacco: Never Used  . Tobacco comment: 2 cigarettes  a day.  Vaping Use  . Vaping Use: Never used  Substance and Sexual Activity  . Alcohol use: Not Currently    Alcohol/week: 2.0 standard drinks    Types: 2 Shots of liquor per week    Comment: denies  . Drug use: No  . Sexual activity: Not Currently  Other Topics Concern  . Not on file  Social History Narrative  . Not on file   Social Determinants of Health   Financial Resource Strain:   . Difficulty of Paying Living Expenses: Not on file  Food Insecurity:   . Worried About Charity fundraiser in the Last Year: Not on file  . Ran Out of Food in the Last Year: Not on file  Transportation Needs:   . Lack of Transportation (Medical): Not on file  . Lack of Transportation (Non-Medical): Not on file  Physical Activity:   . Days of Exercise per Week: Not on file  . Minutes of Exercise per Session: Not on file  Stress:   . Feeling of Stress : Not on file  Social Connections:   . Frequency of Communication with Friends and Family: Not on file  . Frequency of Social Gatherings with Friends and Family: Not on file  . Attends Religious Services: Not on file  . Active Member of Clubs or Organizations: Not on file  . Attends Archivist Meetings: Not on file  . Marital Status: Not on file  Intimate Partner Violence:   . Fear of Current or Ex-Partner: Not on file  . Emotionally Abused: Not on file  . Physically Abused: Not on file  . Sexually Abused: Not on file    Review of Systems: General: Negative for anorexia, weight loss, fever, chills, fatigue, weakness. Eyes: Negative for  vision changes.  ENT: Negative for hoarseness, difficulty swallowing. CV: Negative for chest pain, angina, palpitations, peripheral edema.  Respiratory: Negative for dyspnea at rest, cough, sputum, wheezing.  GI: See history of present illness. MS: Negative for joint pain, low back pain.  Neuro: Negative for memory loss, confusion.  Endo: Negative for unusual weight change.  Heme: Negative for bruising or bleeding. Allergy: Negative for rash or hives.  Physical Exam: Vital signs in last 24 hours: Temp:  [97 F (36.1 C)-97.5 F (36.4 C)] 97.5 F (36.4 C) (12/03 0454) Pulse Rate:  [76-91] 77 (12/03 0454) Resp:  [12-24] 16 (12/03 0454) BP: (109-148)/(66-92) 118/69 (12/03 0454) SpO2:  [93 %-100 %] 100 % (12/03 0454) Last BM Date: 01/18/20 General:   Alert,  Well-developed, well-nourished, pleasant and cooperative in NAD Head:  Normocephalic and atraumatic. Eyes:  Sclera clear, no icterus. Conjunctiva pink. Ears:  Normal auditory acuity. Neck:  Supple; no masses or thyromegaly. Lungs:  Clear throughout to auscultation. No wheezes, crackles, or rhonchi. No acute distress. Heart:  Regular rate and rhythm; no murmurs, clicks, rubs,  or gallops. Abdomen:  Soft, nontender. Distended and firm, but no tense ascites. No masses, hepatosplenomegaly or hernias noted. Normal bowel sounds, without guarding, and without rebound.   Rectal:  Mass-like area protruding from the rectum appears consistent with large external hemorrhoid or protruding internal hemorrhoid; non-obstructive but irregular, nodular rectal vault..   Msk:  Symmetrical without gross deformities. Pulses:  Normal bilateral DP pulses noted. Extremities:  Without clubbing or edema. Neurologic:  Alert and  oriented x4;  grossly normal neurologically. Psych:  Alert and cooperative. Normal mood and affect.  Intake/Output from previous day: 12/02 0701 - 12/03 0700 In: 1701.9 [I.V.:1103.7;  IV Piggyback:598.2] Out: -  Intake/Output this  shift: No intake/output data recorded.  Lab Results: Recent Labs    01/18/20 1722 01/19/20 0534 01/20/20 0439  WBC 9.2 5.9 5.3  HGB 10.3* 8.3* 7.2*  HCT 30.7* 24.6* 21.1*  PLT 226 178 129*   BMET Recent Labs    01/18/20 1722 01/19/20 0534 01/20/20 0439  NA 134* 136 137  K 4.0 2.8* 3.7  CL 106 107 111  CO2 18* 18* 16*  GLUCOSE 158* 106* 172*  BUN 27* 24* 21  CREATININE 2.00* 1.73* 1.60*  CALCIUM 7.8* 8.0* 8.4*   LFT Recent Labs    01/18/20 1722 01/19/20 0534 01/20/20 0439  PROT 6.6 6.5  --   ALBUMIN 2.3* 3.2* 3.9  AST 72* 44*  --   ALT 28 22  --   ALKPHOS 125 86  --   BILITOT 3.3* 3.2*  --   BILIDIR 1.7*  --   --   IBILI 1.6*  --   --    PT/INR Recent Labs    01/18/20 2133  LABPROT 19.7*  INR 1.7*   Hepatitis Panel Recent Labs    01/18/20 2133  HEPBSAG NON REACTIVE  HCVAB NON REACTIVE  HEPAIGM Reactive*  HEPBIGM NON REACTIVE   C-Diff No results for input(s): CDIFFTOX in the last 72 hours.  Studies/Results: CT Chest W Contrast  Result Date: 01/18/2020 CLINICAL DATA:  Weakness, weight loss, abdominal distension EXAM: CT CHEST, ABDOMEN, AND PELVIS WITH CONTRAST TECHNIQUE: Multidetector CT imaging of the chest, abdomen and pelvis was performed following the standard protocol during bolus administration of intravenous contrast. CONTRAST:  53mL OMNIPAQUE IOHEXOL 300 MG/ML  SOLN COMPARISON:  06/13/2019 FINDINGS: CT CHEST FINDINGS Cardiovascular: The heart is unremarkable without pericardial effusion. Mild atherosclerosis of the aorta without thoracic aortic aneurysm or dissection. Mediastinum/Nodes: No enlarged mediastinal, hilar, or axillary lymph nodes. Thyroid gland, trachea, and esophagus demonstrate no significant findings. Lungs/Pleura: Trace left pleural effusion. No acute airspace disease or pneumothorax. The central airways are patent. Musculoskeletal: No acute or destructive bony lesions. Reconstructed images demonstrate no additional findings. CT  ABDOMEN PELVIS FINDINGS Hepatobiliary: Diffuse heterogeneity of the liver is noted. Overall, increased enhancement since prior study where diffuse hepatic steatosis was noted. Findings could reflect underlying hepatitis, please correlate with liver function tests. Gallbladder is unremarkable. Pancreas: Diffuse pancreatic atrophy unchanged. Spleen: Normal in size without focal abnormality. Adrenals/Urinary Tract: Multiple right renal cortical cysts unchanged. Otherwise the kidneys enhance normally. No urinary tract calculi or obstruction. Bladder is decompressed, which limits its evaluation. The adrenals are normal. Stomach/Bowel: No bowel obstruction or ileus. Mild wall thickening of the centralized small bowel loops is likely due to underlying liver disease and diffuse ascites. Vascular/Lymphatic: Splenic vein, SMV, and portal vein are patent. Small esophageal and gastric varices are seen within the upper abdomen, as well as recanalization of the umbilical vein. Findings are consistent with portal venous hypertension. Minimal atherosclerosis of the distal aorta. No pathologic adenopathy. Reproductive: Prostate is unremarkable. Other: Large volume ascites is noted. Ascites extends into a right inguinal hernia. No bowel herniation. No free intraperitoneal gas. Musculoskeletal: No acute or destructive bony lesions. Reconstructed images demonstrate no additional findings. IMPRESSION: 1. Diffuse heterogeneity of the liver parenchyma consistent with a combination of hepatitis and underlying hepatic steatosis. 2. Large volume ascites. 3. Trace left pleural effusion. 4. Small gastric and esophageal varices. 5. Right inguinal hernia containing peritoneal fat and ascites. No bowel herniation. Electronically Signed   By: Diana Eves.D.  On: 01/18/2020 19:51   CT ABDOMEN PELVIS W CONTRAST  Result Date: 01/18/2020 CLINICAL DATA:  Weakness, weight loss, abdominal distension EXAM: CT CHEST, ABDOMEN, AND PELVIS WITH  CONTRAST TECHNIQUE: Multidetector CT imaging of the chest, abdomen and pelvis was performed following the standard protocol during bolus administration of intravenous contrast. CONTRAST:  31mL OMNIPAQUE IOHEXOL 300 MG/ML  SOLN COMPARISON:  06/13/2019 FINDINGS: CT CHEST FINDINGS Cardiovascular: The heart is unremarkable without pericardial effusion. Mild atherosclerosis of the aorta without thoracic aortic aneurysm or dissection. Mediastinum/Nodes: No enlarged mediastinal, hilar, or axillary lymph nodes. Thyroid gland, trachea, and esophagus demonstrate no significant findings. Lungs/Pleura: Trace left pleural effusion. No acute airspace disease or pneumothorax. The central airways are patent. Musculoskeletal: No acute or destructive bony lesions. Reconstructed images demonstrate no additional findings. CT ABDOMEN PELVIS FINDINGS Hepatobiliary: Diffuse heterogeneity of the liver is noted. Overall, increased enhancement since prior study where diffuse hepatic steatosis was noted. Findings could reflect underlying hepatitis, please correlate with liver function tests. Gallbladder is unremarkable. Pancreas: Diffuse pancreatic atrophy unchanged. Spleen: Normal in size without focal abnormality. Adrenals/Urinary Tract: Multiple right renal cortical cysts unchanged. Otherwise the kidneys enhance normally. No urinary tract calculi or obstruction. Bladder is decompressed, which limits its evaluation. The adrenals are normal. Stomach/Bowel: No bowel obstruction or ileus. Mild wall thickening of the centralized small bowel loops is likely due to underlying liver disease and diffuse ascites. Vascular/Lymphatic: Splenic vein, SMV, and portal vein are patent. Small esophageal and gastric varices are seen within the upper abdomen, as well as recanalization of the umbilical vein. Findings are consistent with portal venous hypertension. Minimal atherosclerosis of the distal aorta. No pathologic adenopathy. Reproductive: Prostate is  unremarkable. Other: Large volume ascites is noted. Ascites extends into a right inguinal hernia. No bowel herniation. No free intraperitoneal gas. Musculoskeletal: No acute or destructive bony lesions. Reconstructed images demonstrate no additional findings. IMPRESSION: 1. Diffuse heterogeneity of the liver parenchyma consistent with a combination of hepatitis and underlying hepatic steatosis. 2. Large volume ascites. 3. Trace left pleural effusion. 4. Small gastric and esophageal varices. 5. Right inguinal hernia containing peritoneal fat and ascites. No bowel herniation. Electronically Signed   By: Randa Ngo M.D.   On: 01/18/2020 19:51   DG Chest Port 1 View  Result Date: 01/18/2020 CLINICAL DATA:  69 year old male with tachycardia. EXAM: PORTABLE CHEST 1 VIEW COMPARISON:  Chest radiograph dated 06/04/2019. FINDINGS: Shallow inspiration. No focal consolidation, pleural effusion, pneumothorax. The cardiac silhouette is within limits. No acute osseous pathology. IMPRESSION: No active disease. Electronically Signed   By: Anner Crete M.D.   On: 01/18/2020 17:25    Impression: Very pleasant chronic liver patient who is been with multiple admissions to the facility, but never seen in our office as an outpatient.  Previously with hepatic steatosis and hepatomegaly although currently it appears he is now more consistent with early cirrhosis including small gastric and esophageal varices.  Recent EGD in 2020 with no obvious esophageal abnormality.  Colonoscopy in April of this year was incomplete due to poor prep.  He complains of couple months of diarrhea.  Had a large volume rectal bleed this morning.  Anemia in the setting of rectal bleeding- Per the patient, and per staff, he had a large amount of hematochezia and his bowel movement today.  Rectal exam finds a masslike area near the rectum potentially large external hemorrhoid versus protruding internal hemorrhoid, or both.  Rectal vault is a bit  nodular on DRE, no obstruction noted.  This  is likely the source of his bleeding.  However, given incomplete prep unable to completely rule out mucosal abnormality such as developing mass.  More likely hemorrhoid etiology.  However, cannot rule out rapid transit upper GI bleed.  Diarrhea- Per nursing staff the patient did not have true loose stools, it was more consistent with [facility] 5/6 and a soft/pasty stools.  Was not a appropriate specimen for C. difficile and/or GI pathogen panel testing.  Likely early cirrhosis- due to GI bleed with cirrhosis patient is appropriately been started on Rocephin and octreotide.  He is on IV Protonix twice daily.  His bleed is more likely lower GI in nature.  He does have a distended abdomen this morning, although no tense ascites.  Recent CT of his abdomen noted large volume ascites.  He is pending paracentesis today, as ordered by the hospitalist. Not on outpatient NSBB (Nadolol, etc)  Plan: 1. Agree with octreotide gtt 2. Agree with Rocephin 3. Continue Protonix IV bid 4. Recheck H/H at 12pm (8 hrs after last) 5. Transfuse as necessary 6. Will likely need colonoscopy +/- EGD for acute anemia with recent bleed 7. Supportive measures   Thank you for allowing [place] to participate in the care of Leobardo Edmonia James, DNP, AGNP-C Adult & Gerontological Nurse Practitioner [facility]   LOS: 1 day     01/20/2020, 8:46 AM

## 2020-01-21 ENCOUNTER — Encounter (HOSPITAL_COMMUNITY): Payer: Self-pay | Admitting: Family Medicine

## 2020-01-21 ENCOUNTER — Inpatient Hospital Stay (HOSPITAL_COMMUNITY): Payer: PPO | Admitting: Anesthesiology

## 2020-01-21 ENCOUNTER — Encounter (HOSPITAL_COMMUNITY)
Admission: EM | Disposition: A | Discharge status: Home Health | Payer: Self-pay | Source: Home / Self Care | Attending: Family Medicine

## 2020-01-21 DIAGNOSIS — K921 Melena: Secondary | ICD-10-CM

## 2020-01-21 DIAGNOSIS — K644 Residual hemorrhoidal skin tags: Secondary | ICD-10-CM

## 2020-01-21 DIAGNOSIS — K648 Other hemorrhoids: Secondary | ICD-10-CM

## 2020-01-21 DIAGNOSIS — K3189 Other diseases of stomach and duodenum: Secondary | ICD-10-CM

## 2020-01-21 DIAGNOSIS — K2100 Gastro-esophageal reflux disease with esophagitis, without bleeding: Secondary | ICD-10-CM

## 2020-01-21 DIAGNOSIS — I8510 Secondary esophageal varices without bleeding: Secondary | ICD-10-CM

## 2020-01-21 DIAGNOSIS — K449 Diaphragmatic hernia without obstruction or gangrene: Secondary | ICD-10-CM

## 2020-01-21 DIAGNOSIS — K625 Hemorrhage of anus and rectum: Secondary | ICD-10-CM

## 2020-01-21 HISTORY — PX: BIOPSY: SHX5522

## 2020-01-21 HISTORY — PX: ESOPHAGOGASTRODUODENOSCOPY (EGD) WITH PROPOFOL: SHX5813

## 2020-01-21 HISTORY — PX: COLONOSCOPY WITH PROPOFOL: SHX5780

## 2020-01-21 LAB — COMPREHENSIVE METABOLIC PANEL
ALT: 15 U/L (ref 0–44)
AST: 31 U/L (ref 15–41)
Albumin: 3.4 g/dL — ABNORMAL LOW (ref 3.5–5.0)
Alkaline Phosphatase: 61 U/L (ref 38–126)
Anion gap: 9 (ref 5–15)
BUN: 17 mg/dL (ref 8–23)
CO2: 19 mmol/L — ABNORMAL LOW (ref 22–32)
Calcium: 8.5 mg/dL — ABNORMAL LOW (ref 8.9–10.3)
Chloride: 111 mmol/L (ref 98–111)
Creatinine, Ser: 1.66 mg/dL — ABNORMAL HIGH (ref 0.61–1.24)
GFR, Estimated: 45 mL/min — ABNORMAL LOW (ref >60)
Glucose, Bld: 152 mg/dL — ABNORMAL HIGH (ref 70–99)
Potassium: 3.6 mmol/L (ref 3.5–5.1)
Sodium: 139 mmol/L (ref 135–145)
Total Bilirubin: 3.5 mg/dL — ABNORMAL HIGH (ref 0.3–1.2)
Total Protein: 5.9 g/dL — ABNORMAL LOW (ref 6.5–8.1)

## 2020-01-21 LAB — GLUCOSE, CAPILLARY
Glucose-Capillary: 126 mg/dL — ABNORMAL HIGH (ref 70–99)
Glucose-Capillary: 108 mg/dL — ABNORMAL HIGH (ref 70–99)
Glucose-Capillary: 119 mg/dL — ABNORMAL HIGH (ref 70–99)
Glucose-Capillary: 108 mg/dL — ABNORMAL HIGH (ref 70–99)
Glucose-Capillary: 131 mg/dL — ABNORMAL HIGH (ref 70–99)
Glucose-Capillary: 127 mg/dL — ABNORMAL HIGH (ref 70–99)

## 2020-01-21 LAB — CBC
HCT: 27 % — ABNORMAL LOW (ref 39.0–52.0)
Hemoglobin: 9.4 g/dL — ABNORMAL LOW (ref 13.0–17.0)
MCH: 32.9 pg (ref 26.0–34.0)
MCHC: 34.8 g/dL (ref 30.0–36.0)
MCV: 94.4 fL (ref 80.0–100.0)
Platelets: 131 10*3/uL — ABNORMAL LOW (ref 150–400)
RBC: 2.86 MIL/uL — ABNORMAL LOW (ref 4.22–5.81)
RDW: 15.5 % (ref 11.5–15.5)
WBC: 7.6 10*3/uL (ref 4.0–10.5)
nRBC: 0 % (ref 0.0–0.2)

## 2020-01-21 LAB — TYPE AND SCREEN
ABO/RH(D): B POS
Antibody Screen: NEGATIVE
Unit division: 0

## 2020-01-21 LAB — BPAM RBC
Blood Product Expiration Date: 202201052359
ISSUE DATE / TIME: 202112031557
Unit Type and Rh: 7300

## 2020-01-21 LAB — PROTIME-INR
INR: 2.3 — ABNORMAL HIGH (ref 0.8–1.2)
Prothrombin Time: 24.9 seconds — ABNORMAL HIGH (ref 11.4–15.2)

## 2020-01-21 SURGERY — COLONOSCOPY WITH PROPOFOL
Anesthesia: General

## 2020-01-21 MED ORDER — HYDROCORTISONE ACETATE 25 MG RE SUPP
25.0000 mg | Freq: Two times a day (BID) | RECTAL | Status: DC
  Administered 2020-01-21 – 2020-01-26 (×10): 25 mg via RECTAL
  Filled 2020-01-21 (×10): qty 1

## 2020-01-21 MED ORDER — POLYETHYLENE GLYCOL 3350 17 G PO PACK
17.0000 g | PACK | Freq: Two times a day (BID) | ORAL | Status: DC
  Administered 2020-01-21 – 2020-01-24 (×5): 17 g via ORAL
  Filled 2020-01-21 (×7): qty 1

## 2020-01-21 MED ORDER — PROPOFOL 10 MG/ML IV BOLUS
INTRAVENOUS | Status: DC | PRN
  Administered 2020-01-21: 60 mg via INTRAVENOUS
  Administered 2020-01-21 (×2): 100 mg via INTRAVENOUS

## 2020-01-21 MED ORDER — CARVEDILOL 3.125 MG PO TABS
3.1250 mg | ORAL_TABLET | Freq: Two times a day (BID) | ORAL | Status: DC
  Administered 2020-01-21 – 2020-01-26 (×11): 3.125 mg via ORAL
  Filled 2020-01-21 (×11): qty 1

## 2020-01-21 MED ORDER — PROPOFOL 10 MG/ML IV BOLUS
INTRAVENOUS | Status: AC
  Filled 2020-01-21: qty 20

## 2020-01-21 MED ORDER — SODIUM CHLORIDE 0.9 % IV SOLN: INTRAVENOUS | Status: DC

## 2020-01-21 MED ORDER — PANTOPRAZOLE SODIUM 40 MG PO TBEC
40.0000 mg | DELAYED_RELEASE_TABLET | Freq: Two times a day (BID) | ORAL | Status: DC
  Administered 2020-01-21 – 2020-01-26 (×11): 40 mg via ORAL
  Filled 2020-01-21 (×11): qty 1

## 2020-01-21 MED ORDER — PHYTONADIONE 5 MG PO TABS
5.0000 mg | ORAL_TABLET | Freq: Once | ORAL | Status: AC
  Administered 2020-01-21: 5 mg via ORAL
  Filled 2020-01-21: qty 1

## 2020-01-21 MED ORDER — LACTULOSE 10 GM/15ML PO SOLN
30.0000 g | Freq: Every day | ORAL | Status: DC
  Administered 2020-01-21 – 2020-01-26 (×5): 30 g via ORAL
  Filled 2020-01-21 (×6): qty 60

## 2020-01-21 MED ORDER — LACTATED RINGERS IV SOLN: INTRAVENOUS | Status: DC | PRN

## 2020-01-21 NOTE — Transfer of Care (Signed)
Immediate Anesthesia Transfer of Care Note  Patient: Darren Howard  Procedure(s) Performed: COLONOSCOPY WITH PROPOFOL (N/A ) ESOPHAGOGASTRODUODENOSCOPY (EGD) WITH PROPOFOL (N/A ) BIOPSY  Patient Location: PACU  Anesthesia Type:General  Level of Consciousness: awake  Airway & Oxygen Therapy: Patient Spontanous Breathing  Post-op Assessment: Report given to RN and Post -op Vital signs reviewed and stable  Post vital signs: stable  Last Vitals:  Vitals Value Taken Time  BP    Temp    Pulse    Resp    SpO2      Last Pain:  Vitals:   01/20/20 1956  TempSrc:   PainSc: 0-No pain      Patients Stated Pain Goal: 0 (93/81/82 9937)  Complications: No complications documented.

## 2020-01-21 NOTE — Op Note (Signed)
[facility] Patient Name: Darren Howard Procedure Date: 01/21/2020 10:01 AM MRN: [id number] Date of Birth: 05/04/1950 Attending MD: Maylon Peppers ,  CSN: [id number] Age: 69 Admit Type: Outpatient Procedure:                Colonoscopy Indications:              Rectal bleeding Providers:                Maylon Peppers, Lurline Del, RN, Raphael Gibney,                            Technician Referring MD:              Medicines:                Monitored Anesthesia Care Complications:            No immediate complications. Estimated Blood Loss:     Estimated blood loss: none. Procedure:                Pre-Anesthesia Assessment:                           - Prior to the procedure, a History and Physical                            was performed, and patient medications, allergies                            and sensitivities were reviewed. The patient's                            tolerance of previous anesthesia was reviewed.                           - The risks and benefits of the procedure and the                            sedation options and risks were discussed with the                            patient. All questions were answered and informed                            consent was obtained.                           - ASA Grade Assessment: IV - A patient with severe                            systemic disease that is a constant threat to life.                           After obtaining informed consent, the colonoscope                            was passed under direct vision. Throughout the  procedure, the patient's blood pressure, pulse, and                            oxygen saturations were monitored continuously. The                            [id number]-[id number] ([id number]) scope was introduced through                            the anus and advanced to the the cecum, identified                            by appendiceal orifice and ileocecal valve. The                             colonoscopy was performed with difficulty due to                            poor bowel prep. The patient tolerated the                            procedure well. Scope In: 10:04:22 AM Scope Out: 10:27:48 AM Scope Withdrawal Time: 0 hours 15 minutes 13 seconds  Total Procedure Duration: 0 hours 23 minutes 26 seconds  Findings:      Large protruding internal hemorrhoids were found on perianal exam. There       was evidence of ongoing fresh blood oozing coming from this lesion.       Manual tamponade was performed at the end of the procedure for       hemostasis.      A large amount of semi-solid stool was found in the entire colon, making       visualization difficult. Lavage of the area was performed using copious       amounts, resulting in incomplete clearance with fair visualization.       Overall, there was no evidence of hematin or fresh blood or lesions that       explained the presence of rectal bleeding.      Bleeding external internal hemorrhoids were found during retroflexion       after thorough lavage of the area. The hemorrhoids were moderate. Blood       oozing stopped after lavage. No masses were visualized. Impression:               - Hemorrhoids found on perianal exam.                           - Stool in the entire examined colon.                           - Bleeding external and internal hemorrhoids.                           - No specimens collected. Moderate Sedation:      Per Anesthesia Care Recommendation:           - Return patient to hospital ward for ongoing care.                           -  Resume previous diet.                           - Repeat colonoscopy within 1 year for screening                            purposes due to poor prep.                           - Start Miralax BID.                           - If recurretn bleeding, may need to consider                            surgical consult for hemorrhoidectomy. Procedure  Code(s):        --- Professional ---                           [phone], GC, Colonoscopy, flexible; diagnostic,                            including collection of specimen(s) by brushing or                            washing, when performed (separate procedure) Diagnosis Code(s):        --- Professional ---                           K64.4, Residual hemorrhoidal skin tags                           K64.8, Other hemorrhoids                           K62.5, Hemorrhage of anus and rectum CPT copyright 2019 [employer]. All rights reserved. The codes documented in this report are preliminary and upon coder review may  be revised to meet current compliance requirements. Maylon Peppers, MD Maylon Peppers,  01/21/2020 11:08:37 AM This report has been signed electronically. Number of Addenda: 0

## 2020-01-21 NOTE — Brief Op Note (Signed)
01/18/2020 - 01/21/2020  10:56 AM  PATIENT:  Darren Howard  69 y.o. male  PRE-OPERATIVE DIAGNOSIS:  Anemia, GI bleeding, Cirrhosis  POST-OPERATIVE DIAGNOSIS:  Upper:  hiatal, hernia;esophagitis; Grade I, One column esophageal varices; duodenal, polyp; Lower:  inadequate, prep; adequate for viewing blood; actively, oosing, hemorrhoids;  PROCEDURE:  Procedure(s) with comments: COLONOSCOPY WITH PROPOFOL (N/A) ESOPHAGOGASTRODUODENOSCOPY (EGD) WITH PROPOFOL (N/A) BIOPSY - duodenal polyp;  SURGEON:  Surgeon(s) and Role:    * Harvel Quale, MD - Primary  I performed an EGD and colonoscopy under propofol sedation today.  EGD showed presence of grade B esophagitis, small hiatal hernia, grade 1 esophageal varices (1 column) no intervention was performed.  Normal stomach.  There was presence of polypoid lesion in the second portion of the duodenum which measured close to 2 cm, appeared to have adenomatous changes biopsies were performed.  Colonoscopy showed presence of extensive amount of stool throughout the colon but no large lesions, hematin or hematochezia were observed (stool was green).  Remarkably, there was presence of protruding internal hemorrhoids with ongoing bleeding.  Thorough evaluation of the rectal area upon retroflexion did not show any other lesion or masses, no ulcerations.  Compression of the hemorrhoids was performed with cessation of the bleeding.  RECOMMENDATIONS - Return patient to hospital ward for ongoing care.  - Restart Coreg 3.125 mg BID - Resume previous diet.  - Await pathology results.  - Use Prilosec (omeprazole) 40 mg PO BID for 3 months. - Can stop octreotide and Rocephin.  - Start Miralax BID. - If recurretn bleeding, may need to consider surgical consult for hemorrhoidectomy.   Maylon Peppers, MD Gastroenterology and Hepatology [facility]

## 2020-01-21 NOTE — Anesthesia Preprocedure Evaluation (Signed)
Anesthesia Evaluation  Patient identified by MRN, date of birth, ID band Patient awake    Reviewed: Allergy & Precautions, H&P , NPO status , Patient's Chart, lab work & pertinent test results, reviewed documented beta blocker date and time   Airway Mallampati: II  TM Distance: >3 FB Neck ROM: full    Dental no notable dental hx.    Pulmonary neg pulmonary ROS, Current Smoker,    Pulmonary exam normal breath sounds clear to auscultation       Cardiovascular Exercise Tolerance: Good hypertension, negative cardio ROS   Rhythm:regular Rate:Normal     Neuro/Psych PSYCHIATRIC DISORDERS negative neurological ROS     GI/Hepatic PUD, (+) Hepatitis -  Endo/Other  negative endocrine ROSdiabetes  Renal/GU ARFRenal disease  negative genitourinary   Musculoskeletal   Abdominal   Peds  Hematology  (+) Blood dyscrasia, anemia ,   Anesthesia Other Findings   Reproductive/Obstetrics negative OB ROS                             Anesthesia Physical Anesthesia Plan  ASA: III  Anesthesia Plan: General   Post-op Pain Management:    Induction:   PONV Risk Score and Plan: Propofol infusion  Airway Management Planned:   Additional Equipment:   Intra-op Plan:   Post-operative Plan:   Informed Consent: I have reviewed the patients History and Physical, chart, labs and discussed the procedure including the risks, benefits and alternatives for the proposed anesthesia with the patient or authorized representative who has indicated his/her understanding and acceptance.     Dental Advisory Given  Plan Discussed with: CRNA  Anesthesia Plan Comments:         Anesthesia Quick Evaluation

## 2020-01-21 NOTE — Progress Notes (Signed)
Patient Demographics:    Darren Howard, is a 69 y.o. male, DOB - 1951-02-16, SKA:[id number]  Admit date - 01/18/2020   Admitting Physician [name], MD  Outpatient Primary MD for the patient is [name], MD  LOS - 2   Chief Complaint  Patient presents with  . Weakness        Subjective:    Darren Howard today has no fevers, no emesis,  No chest pain,   - Denies abdominal pain -N.p.o. while awaiting GI procedure this morning -LPN [place] at the bedside  Assessment  & Plan :    Principal Problem:   AKI (acute kidney injury) ([place]) Active Problems:   Hypomagnesemia   Hypertension   Normocytic anemia   DM2 (diabetes mellitus, type 2) (HCC)   Alcohol abuse   Tobacco use   HLD (hyperlipidemia)   Hepatorenal syndrome (HCC)   Prolonged QT interval   Abdominal ascites   Generalized weakness   Hepatitis  Brief Summary:-  69 y.o. male with medical history significant of alcohol abuse, tobacco abuse, microcytic anemia, type II DM, history of upper GI bleed, duodenal erosion, erosive gastropathy, hyperlipidemia, hypertension, noncompliance of medications admitted with concerns for AKI in the setting of diarrhea and rectal bleeding  A/p 1)AKI----acute kidney injury suspect due to dehydration in setting of GI losses with diarrhea and possibly some component of hepatorenal syndrome-   creatinine on admission=2.0  , baseline creatinine = 0.8   , creatinine is now= improved to 1.66 -Continue IV octreotide ---renally adjust medications, avoid nephrotoxic agents / dehydration  / hypotension  2) persistent diarrhea- large volume hematochezia noted as well. -- feels there is a large hemorrhoid possibly in his rectal area.,--- no emesis,  Unable to check stool for C. difficile and GI pathogen ask stools are no longer as watery --GI consult requested for possible colonoscopy especially given the  patient's attempted colonoscopy back in April 2021 was poor quality prep -Patient is scheduled for GI procedure/endoluminal evaluation on 01/21/2020  3)hypokalemia and hypomagnesemia--- mostly due to #2 above replace and recheck -Prolonged QT syndrome noted  4) alcoholic liver cirrhosis with ascites---  ---Patient states he quit drinking back in November 2021 -Continue multivitamin -Continue IV Rocephin for SBP prophylaxis -Status post palliative/therapeutic paracentesis on 01/20/20 with 4 L removed  5) acute GI bleed heme positive stools in the setting of diarrhea---- had large volume hematochezia noted as well. -- feels there is a large hemorrhoid possibly in his rectal area., -GI consult requested for possible colonoscopy especially given the patient's attempted colonoscopy back in April 2021 was poor quality prep -c/n IV Protonix,   6) acute on chronic normocytic anemia--- hemoglobin dropping due to GI bleed/acute blood loss anemia -Hemoglobin is up to 9.4 after transfusion of PRBC -Please see #5 above  7) tobacco abuse and COPD--- nicotine patch and bronchodilators-ordered, patient not interested in smoking cessation  8)HTN--hold antihypertensives until more hemodynamically stable especially given GI losses/volume depletion with diarrhea  9)DM2-A1c 5.4, patient is high risk for hypoglycemic episodes, especially given diarrhea -Continue nutritional supplements  10)-Patient with orthostatic dizziness and unsteady gait/ambulatory dysfunction --- concerns about hemodynamic instability, continue midodrine -Get PT eval -May need SNF rehab  Disposition/Need for in-Hospital Stay- patient unable to be  discharged at this time due to --AKI requiring IV fluids, heme positive stool may require endoluminal evaluation, ascites requiring Rocephin IV for SBP prophylaxis  Status is: Inpatient  Remains inpatient appropriate because:AKI requiring IV fluids, heme positive stool may require  endoluminal evaluation, ascites requiring Rocephin IV for SBP prophylaxis -Patient with orthostatic dizziness and unsteady gait,  Disposition: The patient is from: Home              Anticipated d/c is to: SNF              Anticipated d/c date is: 2 days              Patient currently is not medically stable to d/c. Barriers: Not Clinically Stable-   Code Status :  -  Code Status: Full Code   Family Communication:   (patient is alert, awake and coherent)   Consults  :  Gi  DVT Prophylaxis  :   - SCDs   SCDs Start: 01/18/20 2120   Lab Results  Component Value Date   PLT 131 (L) 01/21/2020    Inpatient Medications  Scheduled Meds: . folic acid  1 mg Oral Daily  . midodrine  7.5 mg Oral TID WC  . nicotine  14 mg Transdermal Daily  . pantoprazole (PROTONIX) IV  40 mg Intravenous Q12H  . pentoxifylline  400 mg Oral TID WC  . phytonadione  5 mg Oral Once  . polyethylene glycol-electrolytes  2,000 mL Oral Once  . thiamine  100 mg Oral Daily   Continuous Infusions: . sodium chloride 75 mL/hr at 01/19/20 1017  . cefTRIAXone (ROCEPHIN)  IV Stopped (01/21/20 0330)  . dextrose 5 % and 0.45 % NaCl with KCl 20 mEq/L Stopped (01/21/20 0626)  . octreotide  (SANDOSTATIN)    IV infusion 50 mcg/hr (01/20/20 1833)   PRN Meds:.albuterol, prochlorperazine    Anti-infectives (From admission, onward)   Start     Dose/Rate Route Frequency Ordered Stop   01/18/20 2315  cefTRIAXone (ROCEPHIN) 2 g in sodium chloride 0.9 % 100 mL IVPB       Note to Pharmacy: Unable to use oral antibiotic that we will not increase QT interval.   2 g 200 mL/hr over 30 Minutes Intravenous Every 24 hours 01/18/20 2302          Objective:   Vitals:   01/20/20 1619 01/20/20 1800 01/20/20 1956 01/21/20 0404  BP: (!) 120/56 (!) 122/56 124/78 (!) 140/92  Pulse: 83 80 79 94  Resp: 18 16 17 18   Temp: 98.1 F (36.7 C) 98.1 F (36.7 C) 97.7 F (36.5 C) (!) 97.2 F (36.2 C)  TempSrc: Oral Oral    SpO2: 100%  100% 100% 100%  Weight:      Height:        Wt Readings from Last 3 Encounters:  01/18/20 80.7 kg  06/29/19 93 kg  06/08/19 98.7 kg     Intake/Output Summary (Last 24 hours) at 01/21/2020 0904 Last data filed at 01/21/2020 1601 Gross per 24 hour  Intake 350 ml  Output 500 ml  Net -150 ml     Physical Exam  Gen:- Awake Alert,  In no apparent distress  HEENT:- [place].AT, No sclera icterus Neck-Supple Neck,No JVD,.  Lungs-  CTAB , fair symmetrical air movement CV- S1, S2 normal, regular  Abd-  +ve B.Sounds, Abd Soft, ascites much improved, not particularly tender Extremity/Skin:- No  edema, pedal pulses present  Psych-affect is appropriate, oriented x3 Neuro--generalized  weakness without new acute deficits Rectal Exam--- Per GI provider---Mass-like area protruding from the rectum appears consistent with large external hemorrhoid or protruding internal hemorrhoid; non-obstructive but irregular, nodular rectal vault..     Data Review:   Micro Results Recent Results (from the past 240 hour(s))  Resp Panel by RT-PCR (Flu A&B, Covid) Nasopharyngeal Swab     Status: None   Collection Time: 01/18/20  8:27 PM   Specimen: Nasopharyngeal Swab; Nasopharyngeal(NP) swabs in vial transport medium  Result Value Ref Range Status   SARS Coronavirus 2 by RT PCR NEGATIVE NEGATIVE Final    Comment: (NOTE) SARS-CoV-2 target nucleic acids are NOT DETECTED.  The SARS-CoV-2 RNA is generally detectable in upper respiratory specimens during the acute phase of infection. The lowest concentration of SARS-CoV-2 viral copies this assay can detect is 138 copies/mL. A negative result does not preclude SARS-Cov-2 infection and should not be used as the sole basis for treatment or other patient management decisions. A negative result may occur with  improper specimen collection/handling, submission of specimen other than nasopharyngeal swab, presence of viral mutation(s) within the areas targeted by this  assay, and inadequate number of viral copies(<138 copies/mL). A negative result must be combined with clinical observations, patient history, and epidemiological information. The expected result is Negative.  Fact Sheet for Patients:  EntrepreneurPulse.com.au  Fact Sheet for Healthcare Providers:  IncredibleEmployment.be  This test is no t yet approved or cleared by the [place] FDA and  has been authorized for detection and/or diagnosis of SARS-CoV-2 by FDA under an Emergency Use Authorization (EUA). This EUA will remain  in effect (meaning this test can be used) for the duration of the COVID-19 declaration under Section 564(b)(1) of the Act, 21 U.S.C.section 360bbb-3(b)(1), unless the authorization is terminated  or revoked sooner.       Influenza A by PCR NEGATIVE NEGATIVE Final   Influenza B by PCR NEGATIVE NEGATIVE Final    Comment: (NOTE) The Xpert Xpress SARS-CoV-2/FLU/RSV plus assay is intended as an aid in the diagnosis of influenza from Nasopharyngeal swab specimens and should not be used as a sole basis for treatment. Nasal washings and aspirates are unacceptable for Xpert Xpress SARS-CoV-2/FLU/RSV testing.  Fact Sheet for Patients: EntrepreneurPulse.com.au  Fact Sheet for Healthcare Providers: IncredibleEmployment.be  This test is not yet approved or cleared by the [place] FDA and has been authorized for detection and/or diagnosis of SARS-CoV-2 by FDA under an Emergency Use Authorization (EUA). This EUA will remain in effect (meaning this test can be used) for the duration of the COVID-19 declaration under Section 564(b)(1) of the Act, 21 U.S.C. section 360bbb-3(b)(1), unless the authorization is terminated or revoked.  Performed at [facility], [address], [place], [place] [address]     Radiology Reports CT Chest W Contrast  Result Date: 01/18/2020 CLINICAL DATA:   Weakness, weight loss, abdominal distension EXAM: CT CHEST, ABDOMEN, AND PELVIS WITH CONTRAST TECHNIQUE: Multidetector CT imaging of the chest, abdomen and pelvis was performed following the standard protocol during bolus administration of intravenous contrast. CONTRAST:  66mL OMNIPAQUE IOHEXOL 300 MG/ML  SOLN COMPARISON:  06/13/2019 FINDINGS: CT CHEST FINDINGS Cardiovascular: The heart is unremarkable without pericardial effusion. Mild atherosclerosis of the aorta without thoracic aortic aneurysm or dissection. Mediastinum/Nodes: No enlarged mediastinal, hilar, or axillary lymph nodes. Thyroid gland, trachea, and esophagus demonstrate no significant findings. Lungs/Pleura: Trace left pleural effusion. No acute airspace disease or pneumothorax. The central airways are patent. Musculoskeletal: No acute or destructive bony lesions.  Reconstructed images demonstrate no additional findings. CT ABDOMEN PELVIS FINDINGS Hepatobiliary: Diffuse heterogeneity of the liver is noted. Overall, increased enhancement since prior study where diffuse hepatic steatosis was noted. Findings could reflect underlying hepatitis, please correlate with liver function tests. Gallbladder is unremarkable. Pancreas: Diffuse pancreatic atrophy unchanged. Spleen: Normal in size without focal abnormality. Adrenals/Urinary Tract: Multiple right renal cortical cysts unchanged. Otherwise the kidneys enhance normally. No urinary tract calculi or obstruction. Bladder is decompressed, which limits its evaluation. The adrenals are normal. Stomach/Bowel: No bowel obstruction or ileus. Mild wall thickening of the centralized small bowel loops is likely due to underlying liver disease and diffuse ascites. Vascular/Lymphatic: Splenic vein, SMV, and portal vein are patent. Small esophageal and gastric varices are seen within the upper abdomen, as well as recanalization of the umbilical vein. Findings are consistent with portal venous hypertension. Minimal  atherosclerosis of the distal aorta. No pathologic adenopathy. Reproductive: Prostate is unremarkable. Other: Large volume ascites is noted. Ascites extends into a right inguinal hernia. No bowel herniation. No free intraperitoneal gas. Musculoskeletal: No acute or destructive bony lesions. Reconstructed images demonstrate no additional findings. IMPRESSION: 1. Diffuse heterogeneity of the liver parenchyma consistent with a combination of hepatitis and underlying hepatic steatosis. 2. Large volume ascites. 3. Trace left pleural effusion. 4. Small gastric and esophageal varices. 5. Right inguinal hernia containing peritoneal fat and ascites. No bowel herniation. Electronically Signed   By: Randa Ngo M.D.   On: 01/18/2020 19:51   CT ABDOMEN PELVIS W CONTRAST  Result Date: 01/18/2020 CLINICAL DATA:  Weakness, weight loss, abdominal distension EXAM: CT CHEST, ABDOMEN, AND PELVIS WITH CONTRAST TECHNIQUE: Multidetector CT imaging of the chest, abdomen and pelvis was performed following the standard protocol during bolus administration of intravenous contrast. CONTRAST:  92mL OMNIPAQUE IOHEXOL 300 MG/ML  SOLN COMPARISON:  06/13/2019 FINDINGS: CT CHEST FINDINGS Cardiovascular: The heart is unremarkable without pericardial effusion. Mild atherosclerosis of the aorta without thoracic aortic aneurysm or dissection. Mediastinum/Nodes: No enlarged mediastinal, hilar, or axillary lymph nodes. Thyroid gland, trachea, and esophagus demonstrate no significant findings. Lungs/Pleura: Trace left pleural effusion. No acute airspace disease or pneumothorax. The central airways are patent. Musculoskeletal: No acute or destructive bony lesions. Reconstructed images demonstrate no additional findings. CT ABDOMEN PELVIS FINDINGS Hepatobiliary: Diffuse heterogeneity of the liver is noted. Overall, increased enhancement since prior study where diffuse hepatic steatosis was noted. Findings could reflect underlying hepatitis, please  correlate with liver function tests. Gallbladder is unremarkable. Pancreas: Diffuse pancreatic atrophy unchanged. Spleen: Normal in size without focal abnormality. Adrenals/Urinary Tract: Multiple right renal cortical cysts unchanged. Otherwise the kidneys enhance normally. No urinary tract calculi or obstruction. Bladder is decompressed, which limits its evaluation. The adrenals are normal. Stomach/Bowel: No bowel obstruction or ileus. Mild wall thickening of the centralized small bowel loops is likely due to underlying liver disease and diffuse ascites. Vascular/Lymphatic: Splenic vein, SMV, and portal vein are patent. Small esophageal and gastric varices are seen within the upper abdomen, as well as recanalization of the umbilical vein. Findings are consistent with portal venous hypertension. Minimal atherosclerosis of the distal aorta. No pathologic adenopathy. Reproductive: Prostate is unremarkable. Other: Large volume ascites is noted. Ascites extends into a right inguinal hernia. No bowel herniation. No free intraperitoneal gas. Musculoskeletal: No acute or destructive bony lesions. Reconstructed images demonstrate no additional findings. IMPRESSION: 1. Diffuse heterogeneity of the liver parenchyma consistent with a combination of hepatitis and underlying hepatic steatosis. 2. Large volume ascites. 3. Trace left pleural effusion. 4. Small gastric  and esophageal varices. 5. Right inguinal hernia containing peritoneal fat and ascites. No bowel herniation. Electronically Signed   By: Randa Ngo M.D.   On: 01/18/2020 19:51   US Paracentesis  Result Date: 01/20/2020 INDICATION: Alcohol abuse, ascites EXAM: ULTRASOUND GUIDED THERAPEUTIC PARACENTESIS MEDICATIONS: None COMPLICATIONS: None immediate PROCEDURE: Informed written consent was obtained from the patient after a discussion of the risks, benefits and alternatives to treatment. A timeout was performed prior to the initiation of the procedure. Initial  ultrasound scanning demonstrates a large amount of ascites within the right lower abdominal quadrant. The right lower abdomen was prepped and draped in the usual sterile fashion. 1% lidocaine was used for local anesthesia. Following this, a 5 [place] Yueh catheter was introduced. An ultrasound image was saved for documentation purposes. The paracentesis was performed. The catheter was removed and a dressing was applied. The patient tolerated the procedure well without immediate post procedural complication. FINDINGS: A total of approximately 4 L of yellow ascitic fluid was removed. Samples were sent to the laboratory as requested by the clinical team. IMPRESSION: Successful ultrasound-guided paracentesis yielding 4 liters of peritoneal fluid. Electronically Signed   By: Lavonia Dana M.D.   On: 01/20/2020 13:13   DG Chest Port 1 View  Result Date: 01/18/2020 CLINICAL DATA:  70 year old male with tachycardia. EXAM: PORTABLE CHEST 1 VIEW COMPARISON:  Chest radiograph dated 06/04/2019. FINDINGS: Shallow inspiration. No focal consolidation, pleural effusion, pneumothorax. The cardiac silhouette is within limits. No acute osseous pathology. IMPRESSION: No active disease. Electronically Signed   By: Anner Crete M.D.   On: 01/18/2020 17:25     CBC Recent Labs  Lab 01/18/20 1722 01/19/20 0534 01/20/20 0439 01/20/20 1417 01/21/20 0534  WBC 9.2 5.9 5.3  --  7.6  HGB 10.3* 8.3* 7.2* 8.3* 9.4*  HCT 30.7* 24.6* 21.1* 24.3* 27.0*  PLT 226 178 129*  --  131*  MCV 97.5 96.5 96.8  --  94.4  MCH 32.7 32.5 33.0  --  32.9  MCHC 33.6 33.7 34.1  --  34.8  RDW 15.4 15.4 15.3  --  15.5    Chemistries  Recent Labs  Lab 01/18/20 1722 01/19/20 0534 01/20/20 0439 01/21/20 0534  NA 134* 136 137 139  K 4.0 2.8* 3.7 3.6  CL 106 107 111 111  CO2 18* 18* 16* 19*  GLUCOSE 158* 106* 172* 152*  BUN 27* 24* 21 17  CREATININE 2.00* 1.73* 1.60* 1.66*  CALCIUM 7.8* 8.0* 8.4* 8.5*  MG 0.4*  --  2.1  --   AST 72*  44*  --  31  ALT 28 22  --  15  ALKPHOS 125 86  --  61  BILITOT 3.3* 3.2*  --  3.5*   ------------------------------------------------------------------------------------------------------------------ No results for input(s): CHOL, HDL, LDLCALC, TRIG, CHOLHDL, LDLDIRECT in the last 72 hours.  Lab Results  Component Value Date   HGBA1C 4.4 (L) 01/19/2020   ------------------------------------------------------------------------------------------------------------------ No results for input(s): TSH, T4TOTAL, T3FREE, THYROIDAB in the last 72 hours.  Invalid input(s): FREET3 ------------------------------------------------------------------------------------------------------------------ Recent Labs    01/19/20 0534  VITAMINB12 1,353*  FOLATE 6.3  FERRITIN 454*  TIBC NOT CALCULATED  IRON 50  RETICCTPCT 2.6    Coagulation profile Recent Labs  Lab 01/18/20 2133 01/21/20 0534  INR 1.7* 2.3*    No results for input(s): DDIMER in the last 72 hours.  Cardiac Enzymes No results for input(s): CKMB, TROPONINI, MYOGLOBIN in the last 168 hours.  Invalid input(s): CK ------------------------------------------------------------------------------------------------------------------  Component Value Date/Time   BNP 238.0 (H) 06/05/2019 7416     Roxan Hockey M.D on 01/21/2020 at 9:04 AM  Go to [email or web address] - for contact info  [facility] - Office  [phone]

## 2020-01-21 NOTE — Op Note (Addendum)
[facility] Patient Name: Darren Howard Procedure Date: 01/21/2020 9:01 AM MRN: [id number] Date of Birth: June 03, 1950 Attending MD: Maylon Peppers ,  CSN: [id number] Age: 69 Admit Type: Outpatient Procedure:                Upper GI endoscopy Indications:              Hematochezia, Cirrhosis with suspected esophageal                            varices Providers:                Maylon Peppers, Lurline Del, RN, Raphael Gibney,                            Technician Referring MD:              Medicines:                Monitored Anesthesia Care Complications:            No immediate complications. Estimated Blood Loss:     Estimated blood loss: none. Procedure:                Pre-Anesthesia Assessment:                           - Prior to the procedure, a History and Physical                            was performed, and patient medications, allergies                            and sensitivities were reviewed. The patient's                            tolerance of previous anesthesia was reviewed.                           - The risks and benefits of the procedure and the                            sedation options and risks were discussed with the                            patient. All questions were answered and informed                            consent was obtained.                           - ASA Grade Assessment: IV - A patient with severe                            systemic disease that is a constant threat to life.                           After obtaining informed consent, the endoscope was  passed under direct vision. Throughout the                            procedure, the patient's blood pressure, pulse, and                            oxygen saturations were monitored continuously. The                            [id number]-[id number] ([id number]) scope was introduced through the                            mouth, and advanced to the second part of duodenum.                             The upper GI endoscopy was accomplished without                            difficulty. The patient tolerated the procedure                            well. Scope In: 9:46:12 AM Scope Out: 9:55:12 AM Total Procedure Duration: 0 hours 9 minutes 0 seconds  Findings:      One column of grade I varices with no bleeding and no stigmata of recent       bleeding were found in the lower third of the esophagus,. No red wale       signs were present.      A small hiatal hernia was present.      LA Grade B (one or more mucosal breaks greater than 5 mm, not extending       between the tops of two mucosal folds) esophagitis with no bleeding was       found at the gastroesophageal junction.      The entire examined stomach was normal. No hematin of coffee ground       contents were seen.      A single 20 mm sessile polyp with no bleeding was found in the second       portion of the duodenum. Biopsies were taken with a cold forceps for       histology. No hematin of coffee ground contents were seen. Impression:               - Grade I esophageal varices with no bleeding and                            no stigmata of recent bleeding.                           - Small hiatal hernia.                           - LA Grade B esophagitis with no bleeding.                           - Normal stomach.                           -  A single duodenal polyp. Biopsied. Moderate Sedation:      Per Anesthesia Care Recommendation:           - Return patient to hospital ward for ongoing care.                           - Resume previous diet.                           - Await pathology results.                           - Use Prilosec (omeprazole) 40 mg PO BID for 3                            months.                           - Can stop octreotide and Rocephin.                           - Restart Coreg 3.125 mg BID. Procedure Code(s):        --- Professional ---                           [id number],  GC, Esophagogastroduodenoscopy, flexible,                            transoral; with biopsy, single or multiple Diagnosis Code(s):        --- Professional ---                           K74.60, Unspecified cirrhosis of liver                           I85.10, Secondary esophageal varices without                            bleeding                           K44.9, Diaphragmatic hernia without obstruction or                            gangrene                           K31.89, Other diseases of stomach and duodenum                           K92.1, Melena (includes Hematochezia) CPT copyright 2019 American [employer] Association. All rights reserved. The codes documented in this report are preliminary and upon coder review may  be revised to meet current compliance requirements. Maylon Peppers, MD Maylon Peppers,  01/21/2020 10:57:05 AM This report has been signed electronically. Number of Addenda: 0

## 2020-01-21 NOTE — Anesthesia Postprocedure Evaluation (Signed)
Anesthesia Post Note  Patient: Darren Howard  Procedure(s) Performed: COLONOSCOPY WITH PROPOFOL (N/A ) ESOPHAGOGASTRODUODENOSCOPY (EGD) WITH PROPOFOL (N/A ) BIOPSY  Patient location during evaluation: PACU Anesthesia Type: General Level of consciousness: awake Pain management: pain level controlled Vital Signs Assessment: post-procedure vital signs reviewed and stable Respiratory status: spontaneous breathing Cardiovascular status: blood pressure returned to baseline Postop Assessment: no headache Anesthetic complications: no   No complications documented.   Last Vitals:  Vitals:   01/21/20 0900 01/21/20 1035  BP: (!) 146/82 (!) 132/97  Pulse: 100 (P) 80  Resp: 16   Temp: 36.7 C (P) 36.5 C  SpO2: 100% (P) 100%    Last Pain:  Vitals:   01/20/20 1956  TempSrc:   PainSc: 0-No pain                 Louann Sjogren

## 2020-01-21 NOTE — Plan of Care (Signed)
We will proceed with EGD and colonoscopy as scheduled. Patient was HD stable and had stable hemoglobin today, tolerated most of the prep. I thoroughly discussed with the patient his procedure, including the risks involved. Patient understands what the procedure involves including the benefits and any risks. Patient understands alternatives to the proposed procedure. Risks including (but not limited to) bleeding, tearing of the lining (perforation), rupture of adjacent organs, problems with heart and lung function, infection, and medication reactions. A small percentage of complications may require surgery, hospitalization, repeat endoscopic procedure, and/or transfusion.  Patient understood and agreed.  Maylon Peppers, MD Gastroenterology and Hepatology [facility]

## 2020-01-22 LAB — GLUCOSE, CAPILLARY
Glucose-Capillary: 115 mg/dL — ABNORMAL HIGH (ref 70–99)
Glucose-Capillary: 162 mg/dL — ABNORMAL HIGH (ref 70–99)
Glucose-Capillary: 132 mg/dL — ABNORMAL HIGH (ref 70–99)
Glucose-Capillary: 144 mg/dL — ABNORMAL HIGH (ref 70–99)

## 2020-01-22 NOTE — Progress Notes (Signed)
Darren Howard, M.D. Gastroenterology & Hepatology   Interval History:  NAOEN. Patient underwent EGD showed presence of grade B esophagitis, small hiatal hernia, grade 1 esophageal varices (1 column) no intervention was performed. There was presence of polypoid lesion in the second portion of the duodenum which measured close to 2 cm, appeared to have adenomatous changes biopsies were performed.  Colonoscopy showed presence of extensive amount of stool throughout the colon but no large lesions, hematin or hematochezia were observed (stool was green).  Remarkably, there was presence of protruding internal hemorrhoids with ongoing bleeding.  Thorough evaluation of the rectal area upon retroflexion did not show any other lesion or masses, no ulcerations.  Compression of the hemorrhoids was performed with cessation of the bleeding. Patient was started on coreg 3.125 mg BID. Patient reports feeling better today states that he has not had a bowel movement since yesterday.  Denies having any rectal bleeding or melena.  No abdominal pain or distention, nausea or vomiting but his appetite is decreased. No labs are available today.  Inpatient Medications:  Current Facility-Administered Medications:  .  0.9 %  sodium chloride infusion, , Intravenous, Continuous, Emokpae, Courage, MD, Last Rate: 75 mL/hr at 01/19/20 1017, New Bag at 01/19/20 1017 .  albuterol (VENTOLIN HFA) 108 (90 Base) MCG/ACT inhaler 2 puff, 2 puff, Inhalation, Q4H PRN, Emokpae, Courage, MD .  carvedilol (COREG) tablet 3.125 mg, 3.125 mg, Oral, BID WC, Montez Morita, Ilias Stcharles, MD, 3.125 mg at 01/22/20 0926 .  cefTRIAXone (ROCEPHIN) 2 g in sodium chloride 0.9 % 100 mL IVPB, 2 g, Intravenous, Q24H, Reubin Milan, MD, Last Rate: 200 mL/hr at 01/21/20 2226, 2 g at 01/21/20 2226 .  dextrose 5 % and 0.45 % NaCl with KCl 20 mEq/L infusion, , Intravenous, Continuous, Emokpae, Courage, MD, Last Rate: 20 mL/hr at 01/22/20 0652, New Bag at  01/22/20 0350 .  folic acid (FOLVITE) tablet 1 mg, 1 mg, Oral, Daily, Emokpae, Courage, MD, 1 mg at 01/22/20 0925 .  hydrocortisone (ANUSOL-HC) suppository 25 mg, 25 mg, Rectal, BID, Emokpae, Courage, MD, 25 mg at 01/22/20 0926 .  lactulose (CHRONULAC) 10 GM/15ML solution 30 g, 30 g, Oral, Daily, Emokpae, Courage, MD, 30 g at 01/22/20 0926 .  midodrine (PROAMATINE) tablet 7.5 mg, 7.5 mg, Oral, TID WC, Emokpae, Courage, MD, 7.5 mg at 01/22/20 0926 .  nicotine (NICODERM CQ - dosed in mg/24 hours) patch 14 mg, 14 mg, Transdermal, Daily, Emokpae, Courage, MD, 14 mg at 01/22/20 0926 .  pantoprazole (PROTONIX) EC tablet 40 mg, 40 mg, Oral, BID AC, Emokpae, Courage, MD, 40 mg at 01/22/20 0925 .  pentoxifylline (TRENTAL) CR tablet 400 mg, 400 mg, Oral, TID WC, Reubin Milan, MD, 400 mg at 01/22/20 0925 .  polyethylene glycol (MIRALAX / GLYCOLAX) packet 17 g, 17 g, Oral, BID, Montez Morita, Quillian Quince, MD, 17 g at 01/22/20 0926 .  prochlorperazine (COMPAZINE) injection 5 mg, 5 mg, Intravenous, Q4H PRN, Reubin Milan, MD .  thiamine tablet 100 mg, 100 mg, Oral, Daily, Denton Brick, Courage, MD, 100 mg at 01/22/20 0938   I/O    Intake/Output Summary (Last 24 hours) at 01/22/2020 1019 Last data filed at 01/22/2020 0600 Gross per 24 hour  Intake 800 ml  Output 1200 ml  Net -400 ml     Physical Exam: Temp:  [97.5 F (36.4 C)-98.2 F (36.8 C)] 98.2 F (36.8 C) (12/05 0443) Pulse Rate:  [80-87] 87 (12/05 0443) Resp:  [17-19] 18 (12/05 0443) BP: (104-140)/(65-97) 104/67 (12/05  0443) SpO2:  [99 %-100 %] 99 % (12/05 0443)  Temp (24hrs), Avg:97.9 F (36.6 C), Min:97.5 F (36.4 C), Max:98.2 F (36.8 C) GENERAL: The patient is AO x3, in no acute distress. Elder. HEENT: Head is normocephalic and atraumatic. EOMI are intact. Mouth is well hydrated and without lesions. NECK: Supple. No masses LUNGS: Clear to auscultation. No presence of rhonchi/wheezing/rales. Adequate chest expansion HEART: RRR,  normal s1 and s2. ABDOMEN: Soft, nontender, no guarding, no peritoneal signs, and nondistended. BS +. No masses. EXTREMITIES: Without any cyanosis, clubbing, rash, lesions or edema. NEUROLOGIC: AOx3, no focal motor deficit. SKIN: no jaundice, no rashes  Laboratory Data: CBC:     Component Value Date/Time   WBC 7.6 01/21/2020 0534   RBC 2.86 (L) 01/21/2020 0534   HGB 9.4 (L) 01/21/2020 0534   HCT 27.0 (L) 01/21/2020 0534   PLT 131 (L) 01/21/2020 0534   MCV 94.4 01/21/2020 0534   MCH 32.9 01/21/2020 0534   MCHC 34.8 01/21/2020 0534   RDW 15.5 01/21/2020 0534   LYMPHSABS 1.2 06/04/2019 2005   MONOABS 0.5 06/04/2019 2005   EOSABS 0.0 06/04/2019 2005   BASOSABS 0.0 06/04/2019 2005   COAG:  Lab Results  Component Value Date   INR 2.3 (H) 01/21/2020   INR 1.7 (H) 01/18/2020   INR 1.2 06/04/2019    BMP:  BMP Latest Ref Rng & Units 01/21/2020 01/20/2020 01/19/2020  Glucose 70 - 99 mg/dL 152(H) 172(H) 106(H)  BUN 8 - 23 mg/dL 17 21 24(H)  Creatinine 0.61 - 1.24 mg/dL 1.66(H) 1.60(H) 1.73(H)  Sodium 135 - 145 mmol/L 139 137 136  Potassium 3.5 - 5.1 mmol/L 3.6 3.7 2.8(L)  Chloride 98 - 111 mmol/L 111 111 107  CO2 22 - 32 mmol/L 19(L) 16(L) 18(L)  Calcium 8.9 - 10.3 mg/dL 8.5(L) 8.4(L) 8.0(L)    HEPATIC:  Hepatic Function Latest Ref Rng & Units 01/21/2020 01/20/2020 01/19/2020  Total Protein 6.5 - 8.1 g/dL 5.9(L) - 6.5  Albumin 3.5 - 5.0 g/dL 3.4(L) 3.9 3.2(L)  AST 15 - 41 U/L 31 - 44(H)  ALT 0 - 44 U/L 15 - 22  Alk Phosphatase 38 - 126 U/L 61 - 86  Total Bilirubin 0.3 - 1.2 mg/dL 3.5(H) - 3.2(H)  Bilirubin, Direct 0.0 - 0.2 mg/dL - - -    CARDIAC:  Lab Results  Component Value Date   TROPONINI <0.03 05/13/2018      Imaging: I personally reviewed and interpreted the available labs, imaging and endoscopic files.   Assessment/Plan: Darren Howard is a 69 y.o. 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  The patient was also found to have imaging findings that were concerning for cirrhosis.  He was found to have anemia upon initial evaluation (hemoglobin 7.2).  Due to this, patient underwent EGD and colonoscopy showing grade B esophagitis, small hiatal hernia, grade 1 esophageal varices (1 column) . There was presence of polypoid lesion in the second portion of the duodenum which measured close to 2 cm, appeared to have adenomatous s/p biopsies.  Colonoscopy showed presence of extensive amount of stool throughout the colon but no large lesions, hematin or hematochezia were observed (stool was green).    There was presence of persistent oozing from the hemorrhoids which stopped with  compression.  The patient also underwent paracentesis with removal of 4 L of fluid but no studies were sent.  Since these interventions were performed, the patient has felt well and has not presented any complaints.  The source of his gastrointestinal been well related to hemorrhoids, for which he has not needed any kind of prophylaxis and continue Protonix PO for esophagitis.  After that was discontinued yesterday.  His hemoglobin has remained stable but will need to obtain repeat labs today to assess this further.  Regarding his new diagnosis of cirrhosis, he is not present any alterations are concerning for decompensation.  He only had grade 1 esophageal varices for which he was started on Coreg yesterday, which he should continue.  #Alcoholic liver cirrhosis #Hemorrhoidal bleeding #Esophagitis - Check daily MELD labs - If patient rebleeds may consider surgical evaluation for hemorrhoidectomy - Miralax BID PO - Pantoprazole 40 mg BID - Coreg 3.125 mg BID - Advance diet as tolerated  - c/w folate and thiamine - Alcohol  cessation - Follow up biopsy path  Darren Peppers, MD Gastroenterology and Hepatology [facility]  Note: Occasional unusual wording and randomly placed punctuation marks may result from the use of speech recognition technology to transcribe this document

## 2020-01-22 NOTE — Progress Notes (Signed)
Patient Demographics:    Darren Howard, is a 69 y.o. male, DOB - [birth year], GMW:[id number]  Admit date - 01/18/2020   Admitting Physician Macguire Holsinger Denton Brick, MD  Outpatient Primary MD for the patient is [name], MD  LOS - 3   Chief Complaint  Patient presents with  . Weakness        Subjective:    [name] today has no fevers, no emesis,  No chest pain,   - Denies abdominal pain -Oral intake is fair... No bloody BM -- -Patient with orthostatic dizziness and unsteady gait,  Assessment  & Plan :    Principal Problem:   AKI (acute kidney injury) ([place]) Active Problems:   Hypomagnesemia   Hypertension   Normocytic anemia   DM2 (diabetes mellitus, type 2) (HCC)   Alcohol abuse   Tobacco use   HLD (hyperlipidemia)   Hepatorenal syndrome (HCC)   Prolonged QT interval   Abdominal ascites   Generalized weakness   Hepatitis  Brief Summary:-  69 y.o. male with medical history significant of alcohol abuse, tobacco abuse, microcytic anemia, type II DM, history of upper GI bleed, duodenal erosion, erosive gastropathy, hyperlipidemia, hypertension, noncompliance of medications admitted with concerns for AKI in the setting of diarrhea and rectal bleeding  A/p 1)AKI----acute kidney injury suspect due to dehydration in setting of GI losses with diarrhea and possibly some component of hepatorenal syndrome-   creatinine on admission=2.0  , baseline creatinine = 0.8   , creatinine is now= improved to 1.66 -Continue IV octreotide ---renally adjust medications, avoid nephrotoxic agents / dehydration  / hypotension  2) persistent diarrhea- large volume hematochezia noted as well. --No further bloody BMs -Colonoscopy from 01/21/2020 consistent with rather large hemorrhoid -c/n [facility] -Consider surgical consult for hemorrhoidectomy if rebleeds  3)hypokalemia and hypomagnesemia--- mostly due to #2  above replace and recheck -Prolonged QT syndrome noted  4) alcoholic liver cirrhosis with ascites---  ---Patient states he quit drinking back in November 2021 -Continue multivitamin -Continue IV Rocephin for SBP prophylaxis -Status post palliative/therapeutic paracentesis on 01/20/20 with 4 L removed  5) acute GI bleed heme positive stools in the setting of diarrhea---- had large volume hematochezia noted as well. -- - --Please see #2 above -c/n IV Protonix,   6) acute on chronic normocytic anemia--- hemoglobin dropping due to GI bleed/acute blood loss anemia -Hemoglobin is up to 9.4 after transfusion of PRBC -Please see #2 and  # 5 above  7) tobacco abuse and COPD--- nicotine patch and bronchodilators-ordered, patient not interested in smoking cessation  8)HTN--hold antihypertensives until more hemodynamically stable especially given GI losses/volume depletion with diarrhea  9)DM2-A1c 5.4, patient is high risk for hypoglycemic episodes, especially given diarrhea -Continue nutritional supplements  10)-Patient with orthostatic dizziness and unsteady gait/ambulatory dysfunction --- concerns about hemodynamic instability, continue midodrine -Get PT eval -May need SNF rehab  Disposition/Need for in-Hospital Stay- patient unable to be discharged at this time due to --AKI requiring IV fluids, ascites requiring Rocephin IV for SBP prophylaxis  Status is: Inpatient  Remains inpatient appropriate because:-AKI requiring IV fluids, ascites requiring Rocephin IV for SBP prophylaxis -Patient with orthostatic dizziness and unsteady gait,  Disposition: The patient is from: Home  Anticipated d/c is to: SNF              Anticipated d/c date is: 2 days              Patient currently is not medically stable to d/c. Barriers: Not Clinically Stable-   Code Status :  -  Code Status: Full Code   Family Communication:   (patient is alert, awake and coherent)   Consults  :  Gi  DVT  Prophylaxis  :   - SCDs   SCDs Start: 01/18/20 2120   Lab Results  Component Value Date   PLT 131 (L) 01/21/2020    Inpatient Medications  Scheduled Meds: . carvedilol  3.125 mg Oral BID WC  . folic acid  1 mg Oral Daily  . hydrocortisone  25 mg Rectal BID  . lactulose  30 g Oral Daily  . midodrine  7.5 mg Oral TID WC  . nicotine  14 mg Transdermal Daily  . pantoprazole  40 mg Oral BID AC  . polyethylene glycol  17 g Oral BID  . thiamine  100 mg Oral Daily   Continuous Infusions: . sodium chloride 75 mL/hr at 01/19/20 1017  . dextrose 5 % and 0.45 % NaCl with KCl 20 mEq/L 20 mL/hr at 01/22/20 0652   PRN Meds:.albuterol, prochlorperazine    Anti-infectives (From admission, onward)   Start     Dose/Rate Route Frequency Ordered Stop   01/18/20 2315  cefTRIAXone (ROCEPHIN) 2 g in sodium chloride 0.9 % 100 mL IVPB  Status:  Discontinued       Note to Pharmacy: Unable to use oral antibiotic that we will not increase QT interval.   2 g 200 mL/hr over 30 Minutes Intravenous Every 24 hours 01/18/20 2302 01/22/20 1402        Objective:   Vitals:   01/21/20 1300 01/21/20 1954 01/21/20 2101 01/22/20 0443  BP: 130/87  115/65 104/67  Pulse: 84  80 87  Resp:   17 18  Temp: 98 F (36.7 C)  (!) 97.5 F (36.4 C) 98.2 F (36.8 C)  TempSrc:      SpO2: 100% 99% 100% 99%  Weight:      Height:        Wt Readings from Last 3 Encounters:  01/18/20 80.7 kg  06/29/19 93 kg  06/08/19 98.7 kg     Intake/Output Summary (Last 24 hours) at 01/22/2020 1949 Last data filed at 01/22/2020 1700 Gross per 24 hour  Intake 60 ml  Output 650 ml  Net -590 ml     Physical Exam  Gen:- Awake Alert,  In no apparent distress  HEENT:- [place].AT, No sclera icterus Neck-Supple Neck,No JVD,.  Lungs-  CTAB , fair symmetrical air movement CV- S1, S2 normal, regular  Abd-  +ve B.Sounds, Abd Soft, ascites much improved, not particularly tender Extremity/Skin:- No  edema, pedal pulses present    Psych-affect is appropriate, oriented x3 Neuro--generalized weakness without new acute deficits Rectal Exam--- Per GI provider---Mass-like area protruding from the rectum appears consistent with large external hemorrhoid or protruding internal hemorrhoid; non-obstructive but irregular, nodular rectal vault..     Data Review:   Micro Results Recent Results (from the past 240 hour(s))  Resp Panel by RT-PCR (Flu A&B, Covid) Nasopharyngeal Swab     Status: None   Collection Time: 01/18/20  8:27 PM   Specimen: Nasopharyngeal Swab; Nasopharyngeal(NP) swabs in vial transport medium  Result Value Ref Range Status   SARS  Coronavirus 2 by RT PCR NEGATIVE NEGATIVE Final    Comment: (NOTE) SARS-CoV-2 target nucleic acids are NOT DETECTED.  The SARS-CoV-2 RNA is generally detectable in upper respiratory specimens during the acute phase of infection. The lowest concentration of SARS-CoV-2 viral copies this assay can detect is 138 copies/mL. A negative result does not preclude SARS-Cov-2 infection and should not be used as the sole basis for treatment or other patient management decisions. A negative result may occur with  improper specimen collection/handling, submission of specimen other than nasopharyngeal swab, presence of viral mutation(s) within the areas targeted by this assay, and inadequate number of viral copies(<138 copies/mL). A negative result must be combined with clinical observations, patient history, and epidemiological information. The expected result is Negative.  Fact Sheet for Patients:  EntrepreneurPulse.com.au  Fact Sheet for Healthcare Providers:  IncredibleEmployment.be  This test is no t yet approved or cleared by the [place] FDA and  has been authorized for detection and/or diagnosis of SARS-CoV-2 by FDA under an Emergency Use Authorization (EUA). This EUA will remain  in effect (meaning this test can be used) for the  duration of the COVID-19 declaration under Section 564(b)(1) of the Act, 21 U.S.C.section 360bbb-3(b)(1), unless the authorization is terminated  or revoked sooner.       Influenza A by PCR NEGATIVE NEGATIVE Final   Influenza B by PCR NEGATIVE NEGATIVE Final    Comment: (NOTE) The Xpert Xpress SARS-CoV-2/FLU/RSV plus assay is intended as an aid in the diagnosis of influenza from Nasopharyngeal swab specimens and should not be used as a sole basis for treatment. Nasal washings and aspirates are unacceptable for Xpert Xpress SARS-CoV-2/FLU/RSV testing.  Fact Sheet for Patients: EntrepreneurPulse.com.au  Fact Sheet for Healthcare Providers: IncredibleEmployment.be  This test is not yet approved or cleared by the [place] FDA and has been authorized for detection and/or diagnosis of SARS-CoV-2 by FDA under an Emergency Use Authorization (EUA). This EUA will remain in effect (meaning this test can be used) for the duration of the COVID-19 declaration under Section 564(b)(1) of the Act, 21 U.S.C. section 360bbb-3(b)(1), unless the authorization is terminated or revoked.  Performed at [facility], [address] [place], [place] [address]     Radiology Reports CT Chest W Contrast  Result Date: 01/18/2020 CLINICAL DATA:  Weakness, weight loss, abdominal distension EXAM: CT CHEST, ABDOMEN, AND PELVIS WITH CONTRAST TECHNIQUE: Multidetector CT imaging of the chest, abdomen and pelvis was performed following the standard protocol during bolus administration of intravenous contrast. CONTRAST:  53mL OMNIPAQUE IOHEXOL 300 MG/ML  SOLN COMPARISON:  06/13/2019 FINDINGS: CT CHEST FINDINGS Cardiovascular: The heart is unremarkable without pericardial effusion. Mild atherosclerosis of the aorta without thoracic aortic aneurysm or dissection. Mediastinum/Nodes: No enlarged mediastinal, hilar, or axillary lymph nodes. Thyroid gland, trachea, and esophagus  demonstrate no significant findings. Lungs/Pleura: Trace left pleural effusion. No acute airspace disease or pneumothorax. The central airways are patent. Musculoskeletal: No acute or destructive bony lesions. Reconstructed images demonstrate no additional findings. CT ABDOMEN PELVIS FINDINGS Hepatobiliary: Diffuse heterogeneity of the liver is noted. Overall, increased enhancement since prior study where diffuse hepatic steatosis was noted. Findings could reflect underlying hepatitis, please correlate with liver function tests. Gallbladder is unremarkable. Pancreas: Diffuse pancreatic atrophy unchanged. Spleen: Normal in size without focal abnormality. Adrenals/Urinary Tract: Multiple right renal cortical cysts unchanged. Otherwise the kidneys enhance normally. No urinary tract calculi or obstruction. Bladder is decompressed, which limits its evaluation. The adrenals are normal. Stomach/Bowel: No bowel obstruction or ileus.  Mild wall thickening of the centralized small bowel loops is likely due to underlying liver disease and diffuse ascites. Vascular/Lymphatic: Splenic vein, SMV, and portal vein are patent. Small esophageal and gastric varices are seen within the upper abdomen, as well as recanalization of the umbilical vein. Findings are consistent with portal venous hypertension. Minimal atherosclerosis of the distal aorta. No pathologic adenopathy. Reproductive: Prostate is unremarkable. Other: Large volume ascites is noted. Ascites extends into a right inguinal hernia. No bowel herniation. No free intraperitoneal gas. Musculoskeletal: No acute or destructive bony lesions. Reconstructed images demonstrate no additional findings. IMPRESSION: 1. Diffuse heterogeneity of the liver parenchyma consistent with a combination of hepatitis and underlying hepatic steatosis. 2. Large volume ascites. 3. Trace left pleural effusion. 4. Small gastric and esophageal varices. 5. Right inguinal hernia containing peritoneal fat  and ascites. No bowel herniation. Electronically Signed   By: Randa Ngo M.D.   On: 01/18/2020 19:51   CT ABDOMEN PELVIS W CONTRAST  Result Date: 01/18/2020 CLINICAL DATA:  Weakness, weight loss, abdominal distension EXAM: CT CHEST, ABDOMEN, AND PELVIS WITH CONTRAST TECHNIQUE: Multidetector CT imaging of the chest, abdomen and pelvis was performed following the standard protocol during bolus administration of intravenous contrast. CONTRAST:  31mL OMNIPAQUE IOHEXOL 300 MG/ML  SOLN COMPARISON:  06/13/2019 FINDINGS: CT CHEST FINDINGS Cardiovascular: The heart is unremarkable without pericardial effusion. Mild atherosclerosis of the aorta without thoracic aortic aneurysm or dissection. Mediastinum/Nodes: No enlarged mediastinal, hilar, or axillary lymph nodes. Thyroid gland, trachea, and esophagus demonstrate no significant findings. Lungs/Pleura: Trace left pleural effusion. No acute airspace disease or pneumothorax. The central airways are patent. Musculoskeletal: No acute or destructive bony lesions. Reconstructed images demonstrate no additional findings. CT ABDOMEN PELVIS FINDINGS Hepatobiliary: Diffuse heterogeneity of the liver is noted. Overall, increased enhancement since prior study where diffuse hepatic steatosis was noted. Findings could reflect underlying hepatitis, please correlate with liver function tests. Gallbladder is unremarkable. Pancreas: Diffuse pancreatic atrophy unchanged. Spleen: Normal in size without focal abnormality. Adrenals/Urinary Tract: Multiple right renal cortical cysts unchanged. Otherwise the kidneys enhance normally. No urinary tract calculi or obstruction. Bladder is decompressed, which limits its evaluation. The adrenals are normal. Stomach/Bowel: No bowel obstruction or ileus. Mild wall thickening of the centralized small bowel loops is likely due to underlying liver disease and diffuse ascites. Vascular/Lymphatic: Splenic vein, SMV, and portal vein are patent. Small  esophageal and gastric varices are seen within the upper abdomen, as well as recanalization of the umbilical vein. Findings are consistent with portal venous hypertension. Minimal atherosclerosis of the distal aorta. No pathologic adenopathy. Reproductive: Prostate is unremarkable. Other: Large volume ascites is noted. Ascites extends into a right inguinal hernia. No bowel herniation. No free intraperitoneal gas. Musculoskeletal: No acute or destructive bony lesions. Reconstructed images demonstrate no additional findings. IMPRESSION: 1. Diffuse heterogeneity of the liver parenchyma consistent with a combination of hepatitis and underlying hepatic steatosis. 2. Large volume ascites. 3. Trace left pleural effusion. 4. Small gastric and esophageal varices. 5. Right inguinal hernia containing peritoneal fat and ascites. No bowel herniation. Electronically Signed   By: Randa Ngo M.D.   On: 01/18/2020 19:51   US Paracentesis  Result Date: 01/20/2020 INDICATION: Alcohol abuse, ascites EXAM: ULTRASOUND GUIDED THERAPEUTIC PARACENTESIS MEDICATIONS: None COMPLICATIONS: None immediate PROCEDURE: Informed written consent was obtained from the patient after a discussion of the risks, benefits and alternatives to treatment. A timeout was performed prior to the initiation of the procedure. Initial ultrasound scanning demonstrates a large amount of ascites within  the right lower abdominal quadrant. The right lower abdomen was prepped and draped in the usual sterile fashion. 1% lidocaine was used for local anesthesia. Following this, a 5 [place] Yueh catheter was introduced. An ultrasound image was saved for documentation purposes. The paracentesis was performed. The catheter was removed and a dressing was applied. The patient tolerated the procedure well without immediate post procedural complication. FINDINGS: A total of approximately 4 L of yellow ascitic fluid was removed. Samples were sent to the laboratory as requested  by the clinical team. IMPRESSION: Successful ultrasound-guided paracentesis yielding 4 liters of peritoneal fluid. Electronically Signed   By: Lavonia Dana M.D.   On: 01/20/2020 13:13   DG Chest Port 1 View  Result Date: 01/18/2020 CLINICAL DATA:  69 year old male with tachycardia. EXAM: PORTABLE CHEST 1 VIEW COMPARISON:  Chest radiograph dated 06/04/2019. FINDINGS: Shallow inspiration. No focal consolidation, pleural effusion, pneumothorax. The cardiac silhouette is within limits. No acute osseous pathology. IMPRESSION: No active disease. Electronically Signed   By: Anner Crete M.D.   On: 01/18/2020 17:25     CBC Recent Labs  Lab 01/18/20 1722 01/19/20 0534 01/20/20 0439 01/20/20 1417 01/21/20 0534  WBC 9.2 5.9 5.3  --  7.6  HGB 10.3* 8.3* 7.2* 8.3* 9.4*  HCT 30.7* 24.6* 21.1* 24.3* 27.0*  PLT 226 178 129*  --  131*  MCV 97.5 96.5 96.8  --  94.4  MCH 32.7 32.5 33.0  --  32.9  MCHC 33.6 33.7 34.1  --  34.8  RDW 15.4 15.4 15.3  --  15.5    Chemistries  Recent Labs  Lab 01/18/20 1722 01/19/20 0534 01/20/20 0439 01/21/20 0534  NA 134* 136 137 139  K 4.0 2.8* 3.7 3.6  CL 106 107 111 111  CO2 18* 18* 16* 19*  GLUCOSE 158* 106* 172* 152*  BUN 27* 24* 21 17  CREATININE 2.00* 1.73* 1.60* 1.66*  CALCIUM 7.8* 8.0* 8.4* 8.5*  MG 0.4*  --  2.1  --   AST 72* 44*  --  31  ALT 28 22  --  15  ALKPHOS 125 86  --  61  BILITOT 3.3* 3.2*  --  3.5*   ------------------------------------------------------------------------------------------------------------------ No results for input(s): CHOL, HDL, LDLCALC, TRIG, CHOLHDL, LDLDIRECT in the last 72 hours.  Lab Results  Component Value Date   HGBA1C 4.4 (L) 01/19/2020   ------------------------------------------------------------------------------------------------------------------ No results for input(s): TSH, T4TOTAL, T3FREE, THYROIDAB in the last 72 hours.  Invalid input(s):  FREET3 ------------------------------------------------------------------------------------------------------------------ No results for input(s): VITAMINB12, FOLATE, FERRITIN, TIBC, IRON, RETICCTPCT in the last 72 hours.  Coagulation profile Recent Labs  Lab 01/18/20 2133 01/21/20 0534  INR 1.7* 2.3*    No results for input(s): DDIMER in the last 72 hours.  Cardiac Enzymes No results for input(s): CKMB, TROPONINI, MYOGLOBIN in the last 168 hours.  Invalid input(s): CK ------------------------------------------------------------------------------------------------------------------    Component Value Date/Time   BNP 238.0 (H) 06/05/2019 2440     Roxan Hockey M.D on 01/22/2020 at 7:49 PM  Go to [email or web address] - for contact info  [facility] - Office  [phone]

## 2020-01-22 NOTE — Progress Notes (Signed)
MD made aware of pt heart rate of 44 bpm.

## 2020-01-23 DIAGNOSIS — N179 Acute kidney failure, unspecified: Secondary | ICD-10-CM | POA: Diagnosis not present

## 2020-01-23 LAB — COMPREHENSIVE METABOLIC PANEL
ALT: 16 U/L (ref 0–44)
AST: 34 U/L (ref 15–41)
Albumin: 3.3 g/dL — ABNORMAL LOW (ref 3.5–5.0)
Alkaline Phosphatase: 71 U/L (ref 38–126)
Anion gap: 7 (ref 5–15)
BUN: 18 mg/dL (ref 8–23)
CO2: 20 mmol/L — ABNORMAL LOW (ref 22–32)
Calcium: 8.1 mg/dL — ABNORMAL LOW (ref 8.9–10.3)
Chloride: 113 mmol/L — ABNORMAL HIGH (ref 98–111)
Creatinine, Ser: 1.65 mg/dL — ABNORMAL HIGH (ref 0.61–1.24)
GFR, Estimated: 45 mL/min — ABNORMAL LOW (ref >60)
Glucose, Bld: 159 mg/dL — ABNORMAL HIGH (ref 70–99)
Potassium: 3.4 mmol/L — ABNORMAL LOW (ref 3.5–5.1)
Sodium: 140 mmol/L (ref 135–145)
Total Bilirubin: 3.2 mg/dL — ABNORMAL HIGH (ref 0.3–1.2)
Total Protein: 6.2 g/dL — ABNORMAL LOW (ref 6.5–8.1)

## 2020-01-23 LAB — PROTIME-INR
INR: 2 — ABNORMAL HIGH (ref 0.8–1.2)
Prothrombin Time: 21.7 seconds — ABNORMAL HIGH (ref 11.4–15.2)

## 2020-01-23 LAB — CBC
HCT: 33.6 % — ABNORMAL LOW (ref 39.0–52.0)
Hemoglobin: 11.2 g/dL — ABNORMAL LOW (ref 13.0–17.0)
MCH: 32.2 pg (ref 26.0–34.0)
MCHC: 33.3 g/dL (ref 30.0–36.0)
MCV: 96.6 fL (ref 80.0–100.0)
Platelets: 151 10*3/uL (ref 150–400)
RBC: 3.48 MIL/uL — ABNORMAL LOW (ref 4.22–5.81)
RDW: 15.9 % — ABNORMAL HIGH (ref 11.5–15.5)
WBC: 7.4 10*3/uL (ref 4.0–10.5)
nRBC: 0 % (ref 0.0–0.2)

## 2020-01-23 LAB — GLUCOSE, CAPILLARY
Glucose-Capillary: 123 mg/dL — ABNORMAL HIGH (ref 70–99)
Glucose-Capillary: 138 mg/dL — ABNORMAL HIGH (ref 70–99)

## 2020-01-23 NOTE — TOC Transition Note (Signed)
Transition of Care [facility]) - CM/SW Discharge Note   Patient Details  Name: Darren Howard MRN: [id number] Date of Birth: Aug 25, 1950  Transition of Care [facility]) CM/SW Contact:  Shade Flood, LCSW Phone Number: 01/23/2020, 1:20 PM   Clinical Narrative:     Pt admitted from home and has a high risk readmission score. Met with pt today to assess. Per pt, he lives alone though his son sometimes stays with him. Pt states he has a walker for DME. Pt reports that he is able to get to appointments and obtain medications. It appears pt has a history of noncompliance with treatment recommendations. Pt does state that he has discontinued ETOH intake. Discussed PT recommendations for [facility] PT at dc. Pt agreeable. Reviewed CMS agencies and pt agreeable to referral to any agency that can accept his insurance.  HH will be arranged and added to pt's AVS.  There are no other TOC needs for dc.  Expected Discharge Plan: [place] Barriers to Discharge: Barriers Resolved   Patient Goals and CMS Choice Patient states their goals for this hospitalization and ongoing recovery are:: go home CMS Medicare.gov Compare Post Acute Care list provided to:: Patient Choice offered to / list presented to : Patient  Expected Discharge Plan and Services Expected Discharge Plan: [place] In-house Referral: Clinical Social Work Discharge Planning Services: Follow-up appt scheduled Post Acute Care Choice: Home Health Living arrangements for the past 2 months: [place]: PT [place]: [place] ([place]) Date [place]: 01/23/20   Representative spoke with at [place]: [place] Arrangements/Services Living arrangements for the past 2 months: [place] with:: Self Patient language and need for interpreter reviewed:: Yes Do you feel safe going back to the place where you live?:  Yes      Need for Family Participation in Patient Care: No (Comment) Care giver support system in place?: No (comment) Current home services: DME Criminal Activity/Legal Involvement Pertinent to Current Situation/Hospitalization: No - Comment as needed  Activities of Daily Living Home Assistive Devices/Equipment: Cane (specify quad or straight), Walker (specify type) ADL Screening (condition at time of admission) Patient's cognitive ability adequate to safely complete daily activities?: Yes Is the patient deaf or have difficulty hearing?: No Does the patient have difficulty seeing, even when wearing glasses/contacts?: No Does the patient have difficulty concentrating, remembering, or making decisions?: No Patient able to express need for assistance with ADLs?: Yes Does the patient have difficulty dressing or bathing?: No Independently performs ADLs?: Yes (appropriate for developmental age) Does the patient have difficulty walking or climbing stairs?: No Weakness of Legs: None Weakness of Arms/Hands: None  Permission Sought/Granted Permission sought to share information with : Chartered certified accountant granted to share information with : Yes, Verbal Permission Granted     Permission granted to share info w AGENCY: HH        Emotional Assessment Appearance:: Appears stated age Attitude/Demeanor/Rapport: Engaged Affect (typically observed): Pleasant Orientation: : Oriented to Self, Oriented to Place, Oriented to  Time, Oriented to Situation Alcohol / Substance Use: Not Applicable Psych Involvement: No (comment)  Admission diagnosis:  Hepatitis [K75.9] Other ascites [R18.8] Generalized weakness [R53.1] AKI (acute kidney injury) ([place]) [N17.9] Abdominal ascites [R18.8] Patient Active Problem List   Diagnosis Date  Noted  . Abdominal ascites   . Generalized weakness   . Hepatitis   . AKI (acute kidney injury) ([place]) 01/18/2020  . Hepatorenal syndrome ([place])  01/18/2020  . Prolonged QT interval 01/18/2020  . HLD (hyperlipidemia) 08/11/2019  . Abdominal pain   . Colitis, acute---??? infectious pancolitis 06/13/2019  . Urinary retention   . Diarrhea   . Abnormal CT scan, colon   . Pyelonephritis of right kidney 06/05/2019  . Pyelonephritis 06/05/2019  . Alcohol abuse   . Tobacco use   . Hematochezia 05/30/2018  . Hypokalemia 05/27/2018  . Hypomagnesemia 05/27/2018  . Hypocalcemia 05/27/2018  . Dizziness 05/13/2018  . Epistaxis 02/13/2014  . Hypertension 02/13/2014  . Normocytic anemia 02/13/2014  . DM2 (diabetes mellitus, type 2) ([place]) 02/13/2014  . Tachycardia 02/13/2014  . Hypertensive urgency    PCP:  [name], MD Pharmacy:   [facility], [address] [place] [address] Phone: [phone] Fax: [phone]     Social Determinants of Health (SDOH) Interventions    Readmission Risk Interventions Readmission Risk Prevention Plan 01/23/2020 05/18/2018 05/14/2018  Post Dischage Appt - Complete -  Medication Screening - Complete -  Transportation Screening Complete Complete Complete  PCP or Specialist Appt within 5-7 Days - - Complete  PCP or Specialist Appt within 3-5 Days Complete - -  Home Care Screening - - Complete  Medication Review (RN CM) - - Complete  HRI or Home Care Consult Complete - -  Social Work Consult for Recovery Care Planning/Counseling Complete - -  Palliative Care Screening Not Applicable - -  Medication Review Press photographer) Complete - -  Some recent data might be hidden   tao  Final next level of care: [place] Barriers to Discharge: Barriers Resolved   Patient Goals and CMS Choice Patient states their goals for this hospitalization and ongoing recovery are:: go home CMS Medicare.gov Compare Post Acute Care list provided to:: Patient Choice offered to / list presented to :  Patient  Discharge Placement                       Discharge Plan and Services In-house Referral: Clinical Social Work Discharge Planning Services: Follow-up appt scheduled Post Acute Care Choice: Home Health                    HH Arranged: PT [place] Agency: [place] ([place]) Date [place]: 01/23/20   Representative spoke with at [place]: [place] ([place]) Interventions     Readmission Risk Interventions Readmission Risk Prevention Plan 01/23/2020 05/18/2018 05/14/2018  Post Dischage Appt - Complete -  Medication Screening - Complete -  Transportation Screening Complete Complete Complete  PCP or Specialist Appt within 5-7 Days - - Complete  PCP or Specialist Appt within 3-5 Days Complete - -  Home Care Screening - - Complete  Medication Review (RN CM) - - Complete  HRI or Home Care Consult Complete - -  Social Work Consult for [place] Planning/Counseling Complete - -  Palliative Care Screening Not Applicable - -  Medication Review (RN Care Manager) Complete - -  Some recent data might be hidden

## 2020-01-23 NOTE — Progress Notes (Signed)
Subjective: No abdominal pain. Per nursing staff, having loose stool multiple episodes in setting of Miralax and lactulose. Small amount of rectal bleeding with BMs. No confusion or mental status changes.   Objective: Vital signs in last 24 hours: Temp:  [98 F (36.7 C)] 98 F (36.7 C) (12/06 0600) Pulse Rate:  [59-61] 59 (12/06 0600) Resp:  [16-18] 16 (12/06 0600) BP: (130-140)/(77-81) 140/81 (12/06 0600) SpO2:  [100 %] 100 % (12/06 0600) Last BM Date: 01/23/20 General:   Alert and oriented, pleasant, sitting up in chair Abdomen:  Bowel sounds present, limited exam with patient sitting up in chair, soft but round Extremities:  Without  edema. Neurologic:  Alert and  oriented x4;  Negative asterixis Psych:  Normal mood and affect.  Intake/Output from previous day: 12/05 0701 - 12/06 0700 In: 60 [P.O.:60] Out: 300 [Urine:300] Intake/Output this shift: No intake/output data recorded.  Lab Results: Recent Labs    01/20/20 1417 01/21/20 0534  WBC  --  7.6  HGB 8.3* 9.4*  HCT 24.3* 27.0*  PLT  --  131*   BMET Recent Labs    01/21/20 0534  NA 139  K 3.6  CL 111  CO2 19*  GLUCOSE 152*  BUN 17  CREATININE 1.66*  CALCIUM 8.5*   LFT Recent Labs    01/21/20 0534  PROT 5.9*  ALBUMIN 3.4*  AST 31  ALT 15  ALKPHOS 61  BILITOT 3.5*   PT/INR Recent Labs    01/21/20 0534  LABPROT 24.9*  INR 2.3*    Assessment: 69 year old male with history of ETOH abuse, microcytic anemia, newly diagnosed cirrhosis due to ETOH, admitted with rectal bleeding and anemia.   Anemia: long-standing history of normocytic anemia that is multifactorial in setting of chronic disease, alcohol use, hospitalizations for GI bleeding, receiving 1 unit PRBCs this admission after Hgb declined to 7 range, now up to 9.4 several days ago and labs pending for this morning. While inpatient, underwent EGD and colonoscopy. Colonoscopy with poor prep and large amount of semi-solid stool in entire  colon, large protruding internal hemorrhoids with fresh oozing from hemorrhoids requiring manual tamponade. EGD with Grade 1 esophageal varices without bleeding, LA Grade B esophagitis, normal stomach, single duodenal polyp s/p biopsy. INR 2.3 on 12/4, with repeat INR ordered but not completed today. Received one dose of Vit K 5 mg orally on 12/4. He would benefit from surgical consult for hemorrhoidectomy, as he continues to have low-volume hematochezia chronically. If persistent or worsening anemia after hemorrhoids addressed, could consider capsule study.   Cirrhosis: will need to calculate MELD once labs resulted today. No evidence for encephalopathy. Para this admission with 4 liters removed but no fluid analysis. Continue on Coreg for variceal bleeding prophylaxis.   Diarrhea: stool noted in colon on colonoscopy but now with multiple loose stools per nursing staff. Doubt infections in setting of receiving both Miralax and Lactulose. On hold for today, as she had accidents in hallway as well while ambulating with PT. Will need to be on a dedicated bowel regimen but having numerous stools now. Would recommend just Miralax and hold off on Lactulose; I am unaware of encephalopathic history.    Plan: Follow-up on pending labs ordered (CBC, CMP, INR) PPI BID Hold Lactulose and Miralax dosing this morning Continue Coreg ETOH cessation Recommend Surgical Consult due to extensive hemorrhoids: not outpatient banding candidate due to severity AND history of cirrhosis Follow-up on path Will continue to follow with you  Annitta Needs, PhD, ANP-BC [facility]     LOS: 4 days    01/23/2020, 8:41 AM

## 2020-01-23 NOTE — Progress Notes (Signed)
Patient Demographics:    Darren Howard, is a 69 y.o. male, DOB - 29-Oct-1950, EEF:[id number]  Admit date - 01/18/2020   Admitting Physician [name], MD  Outpatient Primary MD for the patient is [name], MD  LOS - 4   Chief Complaint  Patient presents with  . Weakness        Subjective:    Darren Howard today has no fevers, no emesis,  No chest pain,   - -Patient had multiple loose BMs -Some dizziness   Assessment  & Plan :    Principal Problem:   AKI (acute kidney injury) ([place]) Active Problems:   Hypomagnesemia   Hypertension   Normocytic anemia   DM2 (diabetes mellitus, type 2) (HCC)   Alcohol abuse   Tobacco use   HLD (hyperlipidemia)   Hepatorenal syndrome (HCC)   Prolonged QT interval   Abdominal ascites   Generalized weakness   Hepatitis  Brief Summary:-  68 y.o. male with medical history significant of alcohol abuse, tobacco abuse, microcytic anemia, type II DM, history of upper GI bleed, duodenal erosion, erosive gastropathy, hyperlipidemia, hypertension, noncompliance of medications admitted with concerns for AKI in the setting of diarrhea and rectal bleeding  A/p 1)AKI----acute kidney injury suspect due to dehydration in setting of GI losses with diarrhea and possibly some component of hepatorenal syndrome-   creatinine on admission=2.0  , baseline creatinine = 0.8   , creatinine is now= improved to 1.65 ---renally adjust medications, avoid nephrotoxic agents / dehydration  / hypotension  2) persistent diarrhea- large volume hematochezia noted as well. --No further bloody BMs -Colonoscopy from 01/21/2020 consistent with rather large hemorrhoid -c/n [facility] -Consider surgical consult for hemorrhoidectomy if rebleeds -Hold lactulose and MiraLAX  3)hypokalemia and hypomagnesemia--- mostly due to #2 above replace and recheck -Prolonged QT syndrome noted  4)  alcoholic liver cirrhosis with ascites---  ---Patient states he quit drinking back in November 2021 -Continue multivitamin -Completed IV Rocephin for SBP prophylaxis -Status post palliative/therapeutic paracentesis on 01/20/20 with 4 L removed -Anticipate another paracentesis on 01/24/2020 prior to discharge home MELD score is 23  5) acute GI bleed heme positive stools in the setting of diarrhea---- had large volume hematochezia noted as well. -- - --Please see #2 above -c/n IV Protonix,   6) acute on chronic normocytic anemia--- hemoglobin dropped initially due to GI bleed/acute blood loss anemia -Hemoglobin is up to 11.2 after transfusion of PRBC -Please see #2 and  # 5 above  7)Tobacco abuse and COPD--- nicotine patch and bronchodilators-ordered, patient not interested in smoking cessation  8)HTN--hold antihypertensives until more hemodynamically stable especially given GI losses/volume depletion with diarrhea  9)DM2-A1c 5.4, patient is high risk for hypoglycemic episodes, especially given diarrhea -Continue nutritional supplements  10)-Patient with orthostatic dizziness and unsteady gait/ambulatory dysfunction --- concerns about hemodynamic instability, continue midodrine  PT eval appreciated, recommends home health PT   Disposition/Need for in-Hospital Stay- patient unable to be discharged at this time due to --anticipate discharge home on 01/24/20 if remains hemodynamically stable after paracentesis  Status is: Inpatient  Remains inpatient appropriate because:-AKI requiring IV fluids, ascites requiring Rocephin IV for SBP prophylaxis -Patient with orthostatic dizziness and unsteady gait,  Disposition: The patient is from: Home  Anticipated d/c is to: Home with [facility]               Anticipated d/c date is: 1 day              Patient currently is not medically stable to d/c. Barriers: Not Clinically Stable-   Code Status :  -  Code Status: Full Code   Family  Communication:   (patient is alert, awake and coherent)   Consults  :  Gi  DVT Prophylaxis  :   - SCDs   SCDs Start: 01/18/20 2120   Lab Results  Component Value Date   PLT 151 01/23/2020    Inpatient Medications  Scheduled Meds: . carvedilol  3.125 mg Oral BID WC  . folic acid  1 mg Oral Daily  . hydrocortisone  25 mg Rectal BID  . lactulose  30 g Oral Daily  . midodrine  7.5 mg Oral TID WC  . nicotine  14 mg Transdermal Daily  . pantoprazole  40 mg Oral BID AC  . polyethylene glycol  17 g Oral BID  . thiamine  100 mg Oral Daily   Continuous Infusions:  PRN Meds:.albuterol, prochlorperazine    Anti-infectives (From admission, onward)   Start     Dose/Rate Route Frequency Ordered Stop   01/18/20 2315  cefTRIAXone (ROCEPHIN) 2 g in sodium chloride 0.9 % 100 mL IVPB  Status:  Discontinued       Note to Pharmacy: Unable to use oral antibiotic that we will not increase QT interval.   2 g 200 mL/hr over 30 Minutes Intravenous Every 24 hours 01/18/20 2302 01/22/20 1402        Objective:   Vitals:   01/22/20 0443 01/22/20 2131 01/23/20 0600 01/23/20 1634  BP: 104/67 130/77 140/81 122/64  Pulse: 87 61 (!) 59 61  Resp: 18 18 16 20   Temp: 98.2 F (36.8 C) 98 F (36.7 C) 98 F (36.7 C) 98.2 F (36.8 C)  TempSrc:  Oral Oral Oral  SpO2: 99% 100% 100% 100%  Weight:      Height:        Wt Readings from Last 3 Encounters:  01/18/20 80.7 kg  06/29/19 93 kg  06/08/19 98.7 kg     Intake/Output Summary (Last 24 hours) at 01/23/2020 1838 Last data filed at 01/23/2020 1800 Gross per 24 hour  Intake 2030.03 ml  Output 610 ml  Net 1420.03 ml     Physical Exam  Gen:- Awake Alert,  In no apparent distress  HEENT:- [place].AT, No sclera icterus Neck-Supple Neck,No JVD,.  Lungs-  CTAB , fair symmetrical air movement CV- S1, S2 normal, regular  Abd-  +ve B.Sounds, Abd Soft, abdominal distention is worsened again not particularly tender Extremity/Skin:- No  edema, pedal  pulses present  Psych-affect is appropriate, oriented x3 Neuro--generalized weakness without new acute deficits Rectal Exam--- Per GI provider---Mass-like area protruding from the rectum appears consistent with large external hemorrhoid or protruding internal hemorrhoid; non-obstructive but irregular, nodular rectal vault..     Data Review:   Micro Results Recent Results (from the past 240 hour(s))  Resp Panel by RT-PCR (Flu A&B, Covid) Nasopharyngeal Swab     Status: None   Collection Time: 01/18/20  8:27 PM   Specimen: Nasopharyngeal Swab; Nasopharyngeal(NP) swabs in vial transport medium  Result Value Ref Range Status   SARS Coronavirus 2 by RT PCR NEGATIVE NEGATIVE Final    Comment: (NOTE) SARS-CoV-2 target nucleic acids are NOT DETECTED.  The SARS-CoV-2 RNA is generally detectable in upper respiratory specimens during the acute phase of infection. The lowest concentration of SARS-CoV-2 viral copies this assay can detect is 138 copies/mL. A negative result does not preclude SARS-Cov-2 infection and should not be used as the sole basis for treatment or other patient management decisions. A negative result may occur with  improper specimen collection/handling, submission of specimen other than nasopharyngeal swab, presence of viral mutation(s) within the areas targeted by this assay, and inadequate number of viral copies(<138 copies/mL). A negative result must be combined with clinical observations, patient history, and epidemiological information. The expected result is Negative.  Fact Sheet for Patients:  EntrepreneurPulse.com.au  Fact Sheet for Healthcare Providers:  IncredibleEmployment.be  This test is no t yet approved or cleared by the [place] FDA and  has been authorized for detection and/or diagnosis of SARS-CoV-2 by FDA under an Emergency Use Authorization (EUA). This EUA will remain  in effect (meaning this test can be used)  for the duration of the COVID-19 declaration under Section 564(b)(1) of the Act, 21 U.S.C.section 360bbb-3(b)(1), unless the authorization is terminated  or revoked sooner.       Influenza A by PCR NEGATIVE NEGATIVE Final   Influenza B by PCR NEGATIVE NEGATIVE Final    Comment: (NOTE) The Xpert Xpress SARS-CoV-2/FLU/RSV plus assay is intended as an aid in the diagnosis of influenza from Nasopharyngeal swab specimens and should not be used as a sole basis for treatment. Nasal washings and aspirates are unacceptable for Xpert Xpress SARS-CoV-2/FLU/RSV testing.  Fact Sheet for Patients: EntrepreneurPulse.com.au  Fact Sheet for Healthcare Providers: IncredibleEmployment.be  This test is not yet approved or cleared by the [place] FDA and has been authorized for detection and/or diagnosis of SARS-CoV-2 by FDA under an Emergency Use Authorization (EUA). This EUA will remain in effect (meaning this test can be used) for the duration of the COVID-19 declaration under Section 564(b)(1) of the Act, 21 U.S.C. section 360bbb-3(b)(1), unless the authorization is terminated or revoked.  Performed at [facility], [address], [place], [place] [address]     Radiology Reports CT Chest W Contrast  Result Date: 01/18/2020 CLINICAL DATA:  Weakness, weight loss, abdominal distension EXAM: CT CHEST, ABDOMEN, AND PELVIS WITH CONTRAST TECHNIQUE: Multidetector CT imaging of the chest, abdomen and pelvis was performed following the standard protocol during bolus administration of intravenous contrast. CONTRAST:  60mL OMNIPAQUE IOHEXOL 300 MG/ML  SOLN COMPARISON:  06/13/2019 FINDINGS: CT CHEST FINDINGS Cardiovascular: The heart is unremarkable without pericardial effusion. Mild atherosclerosis of the aorta without thoracic aortic aneurysm or dissection. Mediastinum/Nodes: No enlarged mediastinal, hilar, or axillary lymph nodes. Thyroid gland, trachea, and  esophagus demonstrate no significant findings. Lungs/Pleura: Trace left pleural effusion. No acute airspace disease or pneumothorax. The central airways are patent. Musculoskeletal: No acute or destructive bony lesions. Reconstructed images demonstrate no additional findings. CT ABDOMEN PELVIS FINDINGS Hepatobiliary: Diffuse heterogeneity of the liver is noted. Overall, increased enhancement since prior study where diffuse hepatic steatosis was noted. Findings could reflect underlying hepatitis, please correlate with liver function tests. Gallbladder is unremarkable. Pancreas: Diffuse pancreatic atrophy unchanged. Spleen: Normal in size without focal abnormality. Adrenals/Urinary Tract: Multiple right renal cortical cysts unchanged. Otherwise the kidneys enhance normally. No urinary tract calculi or obstruction. Bladder is decompressed, which limits its evaluation. The adrenals are normal. Stomach/Bowel: No bowel obstruction or ileus. Mild wall thickening of the centralized small bowel loops is likely due to underlying liver disease and diffuse ascites. Vascular/Lymphatic: Splenic  vein, SMV, and portal vein are patent. Small esophageal and gastric varices are seen within the upper abdomen, as well as recanalization of the umbilical vein. Findings are consistent with portal venous hypertension. Minimal atherosclerosis of the distal aorta. No pathologic adenopathy. Reproductive: Prostate is unremarkable. Other: Large volume ascites is noted. Ascites extends into a right inguinal hernia. No bowel herniation. No free intraperitoneal gas. Musculoskeletal: No acute or destructive bony lesions. Reconstructed images demonstrate no additional findings. IMPRESSION: 1. Diffuse heterogeneity of the liver parenchyma consistent with a combination of hepatitis and underlying hepatic steatosis. 2. Large volume ascites. 3. Trace left pleural effusion. 4. Small gastric and esophageal varices. 5. Right inguinal hernia containing  peritoneal fat and ascites. No bowel herniation. Electronically Signed   By: Randa Ngo M.D.   On: 01/18/2020 19:51   CT ABDOMEN PELVIS W CONTRAST  Result Date: 01/18/2020 CLINICAL DATA:  Weakness, weight loss, abdominal distension EXAM: CT CHEST, ABDOMEN, AND PELVIS WITH CONTRAST TECHNIQUE: Multidetector CT imaging of the chest, abdomen and pelvis was performed following the standard protocol during bolus administration of intravenous contrast. CONTRAST:  50mL OMNIPAQUE IOHEXOL 300 MG/ML  SOLN COMPARISON:  06/13/2019 FINDINGS: CT CHEST FINDINGS Cardiovascular: The heart is unremarkable without pericardial effusion. Mild atherosclerosis of the aorta without thoracic aortic aneurysm or dissection. Mediastinum/Nodes: No enlarged mediastinal, hilar, or axillary lymph nodes. Thyroid gland, trachea, and esophagus demonstrate no significant findings. Lungs/Pleura: Trace left pleural effusion. No acute airspace disease or pneumothorax. The central airways are patent. Musculoskeletal: No acute or destructive bony lesions. Reconstructed images demonstrate no additional findings. CT ABDOMEN PELVIS FINDINGS Hepatobiliary: Diffuse heterogeneity of the liver is noted. Overall, increased enhancement since prior study where diffuse hepatic steatosis was noted. Findings could reflect underlying hepatitis, please correlate with liver function tests. Gallbladder is unremarkable. Pancreas: Diffuse pancreatic atrophy unchanged. Spleen: Normal in size without focal abnormality. Adrenals/Urinary Tract: Multiple right renal cortical cysts unchanged. Otherwise the kidneys enhance normally. No urinary tract calculi or obstruction. Bladder is decompressed, which limits its evaluation. The adrenals are normal. Stomach/Bowel: No bowel obstruction or ileus. Mild wall thickening of the centralized small bowel loops is likely due to underlying liver disease and diffuse ascites. Vascular/Lymphatic: Splenic vein, SMV, and portal vein are  patent. Small esophageal and gastric varices are seen within the upper abdomen, as well as recanalization of the umbilical vein. Findings are consistent with portal venous hypertension. Minimal atherosclerosis of the distal aorta. No pathologic adenopathy. Reproductive: Prostate is unremarkable. Other: Large volume ascites is noted. Ascites extends into a right inguinal hernia. No bowel herniation. No free intraperitoneal gas. Musculoskeletal: No acute or destructive bony lesions. Reconstructed images demonstrate no additional findings. IMPRESSION: 1. Diffuse heterogeneity of the liver parenchyma consistent with a combination of hepatitis and underlying hepatic steatosis. 2. Large volume ascites. 3. Trace left pleural effusion. 4. Small gastric and esophageal varices. 5. Right inguinal hernia containing peritoneal fat and ascites. No bowel herniation. Electronically Signed   By: Randa Ngo M.D.   On: 01/18/2020 19:51   US Paracentesis  Result Date: 01/20/2020 INDICATION: Alcohol abuse, ascites EXAM: ULTRASOUND GUIDED THERAPEUTIC PARACENTESIS MEDICATIONS: None COMPLICATIONS: None immediate PROCEDURE: Informed written consent was obtained from the patient after a discussion of the risks, benefits and alternatives to treatment. A timeout was performed prior to the initiation of the procedure. Initial ultrasound scanning demonstrates a large amount of ascites within the right lower abdominal quadrant. The right lower abdomen was prepped and draped in the usual sterile fashion. 1% lidocaine was  used for local anesthesia. Following this, a 5 [place] Yueh catheter was introduced. An ultrasound image was saved for documentation purposes. The paracentesis was performed. The catheter was removed and a dressing was applied. The patient tolerated the procedure well without immediate post procedural complication. FINDINGS: A total of approximately 4 L of yellow ascitic fluid was removed. Samples were sent to the  laboratory as requested by the clinical team. IMPRESSION: Successful ultrasound-guided paracentesis yielding 4 liters of peritoneal fluid. Electronically Signed   By: Lavonia Dana M.D.   On: 01/20/2020 13:13   DG Chest Port 1 View  Result Date: 01/18/2020 CLINICAL DATA:  69 year old male with tachycardia. EXAM: PORTABLE CHEST 1 VIEW COMPARISON:  Chest radiograph dated 06/04/2019. FINDINGS: Shallow inspiration. No focal consolidation, pleural effusion, pneumothorax. The cardiac silhouette is within limits. No acute osseous pathology. IMPRESSION: No active disease. Electronically Signed   By: Anner Crete M.D.   On: 01/18/2020 17:25     CBC Recent Labs  Lab 01/18/20 1722 01/18/20 1722 01/19/20 0534 01/20/20 0439 01/20/20 1417 01/21/20 0534 01/23/20 0959  WBC 9.2  --  5.9 5.3  --  7.6 7.4  HGB 10.3*   < > 8.3* 7.2* 8.3* 9.4* 11.2*  HCT 30.7*   < > 24.6* 21.1* 24.3* 27.0* 33.6*  PLT 226  --  178 129*  --  131* 151  MCV 97.5  --  96.5 96.8  --  94.4 96.6  MCH 32.7  --  32.5 33.0  --  32.9 32.2  MCHC 33.6  --  33.7 34.1  --  34.8 33.3  RDW 15.4  --  15.4 15.3  --  15.5 15.9*   < > = values in this interval not displayed.    Chemistries  Recent Labs  Lab 01/18/20 1722 01/19/20 0534 01/20/20 0439 01/21/20 0534 01/23/20 0959  NA 134* 136 137 139 140  K 4.0 2.8* 3.7 3.6 3.4*  CL 106 107 111 111 113*  CO2 18* 18* 16* 19* 20*  GLUCOSE 158* 106* 172* 152* 159*  BUN 27* 24* 21 17 18   CREATININE 2.00* 1.73* 1.60* 1.66* 1.65*  CALCIUM 7.8* 8.0* 8.4* 8.5* 8.1*  MG 0.4*  --  2.1  --   --   AST 72* 44*  --  31 34  ALT 28 22  --  15 16  ALKPHOS 125 86  --  61 71  BILITOT 3.3* 3.2*  --  3.5* 3.2*   ------------------------------------------------------------------------------------------------------------------ No results for input(s): CHOL, HDL, LDLCALC, TRIG, CHOLHDL, LDLDIRECT in the last 72 hours.  Lab Results  Component Value Date   HGBA1C 4.4 (L) 01/19/2020    ------------------------------------------------------------------------------------------------------------------ No results for input(s): TSH, T4TOTAL, T3FREE, THYROIDAB in the last 72 hours.  Invalid input(s): FREET3 ------------------------------------------------------------------------------------------------------------------ No results for input(s): VITAMINB12, FOLATE, FERRITIN, TIBC, IRON, RETICCTPCT in the last 72 hours.  Coagulation profile Recent Labs  Lab 01/18/20 2133 01/21/20 0534 01/23/20 0959  INR 1.7* 2.3* 2.0*    No results for input(s): DDIMER in the last 72 hours.  Cardiac Enzymes No results for input(s): CKMB, TROPONINI, MYOGLOBIN in the last 168 hours.  Invalid input(s): CK ------------------------------------------------------------------------------------------------------------------    Component Value Date/Time   BNP 238.0 (H) 06/05/2019 2426   Roxan Hockey M.D on 01/23/2020 at 6:38 PM  Go to [email or web address] - for contact info  [facility] - Office  [phone]

## 2020-01-23 NOTE — Progress Notes (Signed)
MELD Na 23 today.

## 2020-01-23 NOTE — Evaluation (Signed)
Physical Therapy Evaluation Patient Details Name: Darren Howard MRN: [id number] DOB: 1950/05/14 Today's Date: 01/23/2020   History of Present Illness  Darren Howard is a 69 y.o. male with medical history significant of alcohol abuse, tobacco abuse, microcytic anemia, type II DM, history of upper GI bleed, duodenal erosion, erosive gastropathy, hyperlipidemia, hypertension, noncompliance of medications and treatment who is referred by his PCP to the emergency department due to weakness, decreased appetite, weight loss and abdominal distention of several months duration, but particularly significant in the past few weeks.  He denies abdominal pain, nausea, emesis, constipation, melena or hematochezia.  His stools are occasionally soft.  His hearing has been darker and less in volume recently.  No dysuria, frequency or hematuria.  He denies fever, chills, rhinorrhea, sore throat, wheezing or hemoptysis.  He gets occasional dyspnea.  Denies chest pain, palpitations, diaphoresis, PND or orthopnea, but has lower extremity edema and gets occasionally mildly lightheaded.  No polyuria, polydipsia, polyphagia or blurred vision.    Clinical Impression  Patient demonstrates good return for sitting up at bedside and completing bed/chair transfers using [facility] without loss of balance, has difficulty completing sit to stands from commode in bathroom due to BLE weakness, ambulated in hallway with good return for using SPC, but limited mostly due to incontinent of stool and fatigue.  Patient tolerated sitting up in chair after therapy - RN aware.  Patient will benefit from continued physical therapy in hospital and recommended venue below to increase strength, balance, endurance for safe ADLs and gait.     Follow Up Recommendations Home health PT;Supervision for mobility/OOB;Supervision - Intermittent    Equipment Recommendations  None recommended by PT    Recommendations for Other Services       Precautions /  Restrictions Precautions Precautions: Fall Restrictions Weight Bearing Restrictions: No      Mobility  Bed Mobility Overal bed mobility: Modified Independent                  Transfers Overall transfer level: Needs assistance Equipment used: 1 person hand held assist;Straight cane Transfers: Sit to/from Stand;Stand Pivot Transfers Sit to Stand: Min guard Stand pivot transfers: Min guard       General transfer comment: required Min/mod assist to complete sit to stand from commode in bathroom, demonstrates good return for transferring to/from bed and chair  Ambulation/Gait Ambulation/Gait assistance: Supervision;Min guard Gait Distance (Feet): 75 Feet Assistive device: Straight cane Gait Pattern/deviations: Decreased step length - left;Decreased stance time - right;Decreased stride length;Step-to pattern Gait velocity: decreased   General Gait Details: demonstrates fair/good return for using SPC with mostly 3 point gait pattern without loss of balance, limited mostly due to incontinent of stool and fatigue  Stairs            Wheelchair Mobility    Modified Rankin (Stroke Patients Only)       Balance Overall balance assessment: Needs assistance Sitting-balance support: Feet supported;No upper extremity supported Sitting balance-Leahy Scale: Good Sitting balance - Comments: seated at EOB   Standing balance support: During functional activity;No upper extremity supported Standing balance-Leahy Scale: Poor Standing balance comment: fair using SPC                             Pertinent Vitals/Pain Pain Assessment: No/denies pain    Home Living Family/patient expects to be discharged to:: Private residence Living Arrangements: Alone Available Help at Discharge: Family;Available PRN/intermittently Type of Home: House  Home Access: Stairs to enter Entrance Stairs-Rails: None Entrance Stairs-Number of Steps: 3 Home Layout: One level Home  Equipment: Walker - 2 wheels;Cane - single point      Prior Function Level of Independence: Independent with assistive device(s)         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, drives, occasional use of SPC     Hand Dominance   Dominant Hand: Right    Extremity/Trunk Assessment   Upper Extremity Assessment Upper Extremity Assessment: Overall WFL for tasks assessed    Lower Extremity Assessment Lower Extremity Assessment: Generalized weakness    Cervical / Trunk Assessment Cervical / Trunk Assessment: Normal  Communication   Communication: No difficulties  Cognition Arousal/Alertness: Awake/alert Behavior During Therapy: WFL for tasks assessed/performed Overall Cognitive Status: Within Functional Limits for tasks assessed                                        General Comments      Exercises     Assessment/Plan    PT Assessment Patient needs continued PT services  PT Problem List Decreased strength;Decreased activity tolerance;Decreased balance;Decreased mobility       PT Treatment Interventions Balance training;DME instruction;Gait training;Stair training;Functional mobility training;Therapeutic activities;Therapeutic exercise;Patient/family education    PT Goals (Current goals can be found in the Care Plan section)  Acute Rehab PT Goals Patient Stated Goal: return home with family to assist PT Goal Formulation: With patient Time For Goal Achievement: 01/30/20 Potential to Achieve Goals: Good    Frequency Min 3X/week   Barriers to discharge        Co-evaluation               AM-PAC PT "6 Clicks" Mobility  Outcome Measure Help needed turning from your back to your side while in a flat bed without using bedrails?: None Help needed moving from lying on your back to sitting on the side of a flat bed without using bedrails?: None Help needed moving to and from a bed to a chair (including a wheelchair)?: A Little Help needed standing up  from a chair using your arms (e.g., wheelchair or bedside chair)?: A Little Help needed to walk in hospital room?: A Little Help needed climbing 3-5 steps with a railing? : A Little 6 Click Score: 20    End of Session   Activity Tolerance: Patient tolerated treatment well;Patient limited by fatigue Patient left: in chair;with call bell/phone within reach Nurse Communication: Mobility status PT Visit Diagnosis: Other abnormalities of gait and mobility (R26.89);Unsteadiness on feet (R26.81);Muscle weakness (generalized) (M62.81)    Time: 2409-7353 PT Time Calculation (min) (ACUTE ONLY): 31 min   Charges:   PT Evaluation $PT Eval Moderate Complexity: 1 Mod PT Treatments $Therapeutic Activity: 23-37 mins        12:22 PM, 01/23/20 Lonell Grandchild, MPT Physical Therapist with [facility] [phone] office [phone] mobile phone

## 2020-01-23 NOTE — Plan of Care (Signed)
  Problem: Acute Rehab PT Goals(only PT should resolve) Goal: Pt Will Go Supine/Side To Sit Outcome: Progressing Flowsheets (Taken 01/23/2020 1224) Pt will go Supine/Side to Sit: Independently Goal: Patient Will Transfer Sit To/From Stand Outcome: Progressing Flowsheets (Taken 01/23/2020 1224) Patient will transfer sit to/from stand: Independently Goal: Pt Will Transfer Bed To Chair/Chair To Bed Outcome: Progressing Flowsheets (Taken 01/23/2020 1224) Pt will Transfer Bed to Chair/Chair to Bed:  with supervision  with modified independence Goal: Pt Will Ambulate Outcome: Progressing Flowsheets (Taken 01/23/2020 1224) Pt will Ambulate:  > 125 feet  with modified independence  with supervision  with cane   12:25 PM, 01/23/20 Lonell Grandchild, MPT Physical Therapist with [facility] [phone] office [phone] mobile phone

## 2020-01-24 ENCOUNTER — Encounter (HOSPITAL_COMMUNITY): Payer: Self-pay | Admitting: Gastroenterology

## 2020-01-24 ENCOUNTER — Inpatient Hospital Stay (HOSPITAL_COMMUNITY): Payer: PPO

## 2020-01-24 DIAGNOSIS — K7031 Alcoholic cirrhosis of liver with ascites: Secondary | ICD-10-CM

## 2020-01-24 LAB — LACTATE DEHYDROGENASE, PLEURAL OR PERITONEAL FLUID: LD, Fluid: 44 U/L — ABNORMAL HIGH (ref 3–23)

## 2020-01-24 LAB — CBC
HCT: 29.8 % — ABNORMAL LOW (ref 39.0–52.0)
Hemoglobin: 10.2 g/dL — ABNORMAL LOW (ref 13.0–17.0)
MCH: 32.9 pg (ref 26.0–34.0)
MCHC: 34.2 g/dL (ref 30.0–36.0)
MCV: 96.1 fL (ref 80.0–100.0)
Platelets: 117 10*3/uL — ABNORMAL LOW (ref 150–400)
RBC: 3.1 MIL/uL — ABNORMAL LOW (ref 4.22–5.81)
RDW: 15.7 % — ABNORMAL HIGH (ref 11.5–15.5)
WBC: 7 10*3/uL (ref 4.0–10.5)
nRBC: 0 % (ref 0.0–0.2)

## 2020-01-24 LAB — GLUCOSE, CAPILLARY
Glucose-Capillary: 118 mg/dL — ABNORMAL HIGH (ref 70–99)
Glucose-Capillary: 125 mg/dL — ABNORMAL HIGH (ref 70–99)
Glucose-Capillary: 131 mg/dL — ABNORMAL HIGH (ref 70–99)
Glucose-Capillary: 97 mg/dL (ref 70–99)

## 2020-01-24 LAB — COMPREHENSIVE METABOLIC PANEL
ALT: 12 U/L (ref 0–44)
AST: 29 U/L (ref 15–41)
Albumin: 2.8 g/dL — ABNORMAL LOW (ref 3.5–5.0)
Alkaline Phosphatase: 62 U/L (ref 38–126)
Anion gap: 7 (ref 5–15)
BUN: 18 mg/dL (ref 8–23)
CO2: 19 mmol/L — ABNORMAL LOW (ref 22–32)
Calcium: 7.6 mg/dL — ABNORMAL LOW (ref 8.9–10.3)
Chloride: 109 mmol/L (ref 98–111)
Creatinine, Ser: 1.66 mg/dL — ABNORMAL HIGH (ref 0.61–1.24)
GFR, Estimated: 45 mL/min — ABNORMAL LOW (ref >60)
Glucose, Bld: 117 mg/dL — ABNORMAL HIGH (ref 70–99)
Potassium: 3.8 mmol/L (ref 3.5–5.1)
Sodium: 135 mmol/L (ref 135–145)
Total Bilirubin: 2.8 mg/dL — ABNORMAL HIGH (ref 0.3–1.2)
Total Protein: 5.3 g/dL — ABNORMAL LOW (ref 6.5–8.1)

## 2020-01-24 LAB — PROTIME-INR
INR: 2.1 — ABNORMAL HIGH (ref 0.8–1.2)
Prothrombin Time: 23.2 seconds — ABNORMAL HIGH (ref 11.4–15.2)

## 2020-01-24 LAB — BODY FLUID CELL COUNT WITH DIFFERENTIAL
Eos, Fluid: 0 %
Lymphs, Fluid: 22 %
Monocyte-Macrophage-Serous Fluid: 74 % (ref 50–90)
Neutrophil Count, Fluid: 4 % (ref 0–25)
Total Nucleated Cell Count, Fluid: 136 cu mm (ref 0–1000)

## 2020-01-24 LAB — PROTEIN, PLEURAL OR PERITONEAL FLUID: Total protein, fluid: <3 g/dL

## 2020-01-24 LAB — SURGICAL PATHOLOGY

## 2020-01-24 LAB — GRAM STAIN: Gram Stain: NONE SEEN

## 2020-01-24 MED ORDER — ALBUMIN HUMAN 25 % IV SOLN
25.0000 g | Freq: Three times a day (TID) | INTRAVENOUS | Status: DC
  Administered 2020-01-24 – 2020-01-26 (×6): 25 g via INTRAVENOUS
  Filled 2020-01-24 (×6): qty 100

## 2020-01-24 MED ORDER — PHYTONADIONE 5 MG PO TABS
10.0000 mg | ORAL_TABLET | Freq: Once | ORAL | Status: AC
  Administered 2020-01-24: 10 mg via ORAL
  Filled 2020-01-24: qty 2

## 2020-01-24 MED ORDER — ALBUMIN HUMAN 25 % IV SOLN
25.0000 g | Freq: Once | INTRAVENOUS | Status: AC
  Administered 2020-01-24: 25 g via INTRAVENOUS
  Filled 2020-01-24: qty 100

## 2020-01-24 NOTE — Progress Notes (Signed)
Patient Demographics:    Darren Howard, is a 69 y.o. male, DOB - 01-29-1951, GNF:[id number]  Admit date - 01/18/2020   Admitting Physician Elisheba Mcdonnell Denton Brick, MD  Outpatient Primary MD for the patient is [name], MD  LOS - 5   Chief Complaint  Patient presents with  . Weakness        Subjective:    Darren Howard today has no fevers, no emesis,  No chest pain,   - -HAd paracentesis with 4.7 L removed today -Occasional dizzy spells -Diarrhea has improved -Due to concerns about hemodynamic instability GI service initiated IV albumin -   Assessment  & Plan :    Principal Problem:   AKI (acute kidney injury) ([place]) Active Problems:   Hypomagnesemia   Hypertension   Normocytic anemia   DM2 (diabetes mellitus, type 2) (HCC)   Alcohol abuse   Tobacco use   HLD (hyperlipidemia)   Hepatorenal syndrome (HCC)   Prolonged QT interval   Abdominal ascites   Generalized weakness   Hepatitis   Alcoholic cirrhosis of liver with ascites (HCC)  Brief Summary:-  69 y.o. male with medical history significant of alcohol abuse, tobacco abuse, microcytic anemia, type II DM, history of upper GI bleed, duodenal erosion, erosive gastropathy, hyperlipidemia, hypertension, noncompliance of medications admitted with concerns for AKI in the setting of diarrhea and rectal bleeding  A/p 1)AKI----acute kidney injury suspect due to dehydration in setting of GI losses with diarrhea and possibly some component of hepatorenal syndrome-   creatinine on admission=2.0  , baseline creatinine = 0.8   , creatinine is now= improved to 1.66 ---renally adjust medications, avoid nephrotoxic agents / dehydration  / hypotension  2) persistent diarrhea- large volume hematochezia noted as well. --No further bloody BMs -Colonoscopy from 01/21/2020 consistent with rather large hemorrhoid -c/n [facility] - surgical consult for possible  hemorrhoidectomy requested -Okay to restart lactulose but discontinue MiraLAX  3)hypokalemia and hypomagnesemia--- mostly due to #2 above replace and recheck -Prolonged QT syndrome noted  4) alcoholic liver cirrhosis with ascites---  ---Patient states he quit drinking back in November 2021 -Continue multivitamin -Completed IV Rocephin for SBP prophylaxis -Status post palliative/therapeutic paracentesis on 01/20/20 with 4 L removed and again on 01/24/20 with 4.7 L removed -INR 2.1--GI service gave vitamin K -T bili is down to 2.8 from 3.5 MELD score is 23  5) acute GI bleed heme positive stools in the setting of diarrhea---- had large volume hematochezia noted as well. -- - --Please see #2 above -c/n IV Protonix,   6) acute on chronic normocytic anemia--- hemoglobin dropped initially due to GI bleed/acute blood loss anemia -Hemoglobin was down to 7.2 so patient received transfusion of PRBCs -Hemoglobin is down to 10.2 from 11.2  -Please see #2 and  # 5 above  7)Tobacco abuse and COPD--- nicotine patch and bronchodilators-ordered, patient not interested in smoking cessation  8)HTN--hold antihypertensives until more hemodynamically stable especially given GI losses/volume depletion with diarrhea  9)DM2-A1c 5.4, patient is high risk for hypoglycemic episodes, especially given diarrhea -Continue nutritional supplements  10)-Patient with orthostatic dizziness and unsteady gait/ambulatory dysfunction --- concerns about hemodynamic instability, continue midodrine -GI service gave IV albumin  PT eval appreciated, recommends home health PT   Disposition/Need for  in-Hospital Stay- patient unable to be discharged at this time due to --anticipate discharge home on 01/25/20 with home health services if remains hemodynamically stable-patient receiving IV albumin after paracentesis pending surgical consult for possible hemorrhoidectomy  Status is: Inpatient  Remains inpatient appropriate  because:See above -Patient with orthostatic dizziness and unsteady gait,  Disposition: The patient is from: Home              Anticipated d/c is to: Home with [facility]               Anticipated d/c date is: 1 day              Patient currently is not medically stable to d/c. Barriers: Not Clinically Stable-   Code Status :  -  Code Status: Full Code   Family Communication:   (patient is alert, awake and coherent)   Consults  :  Gi  DVT Prophylaxis  :   - SCDs   SCDs Start: 01/18/20 2120   Lab Results  Component Value Date   PLT 117 (L) 01/24/2020    Inpatient Medications  Scheduled Meds: . carvedilol  3.125 mg Oral BID WC  . folic acid  1 mg Oral Daily  . hydrocortisone  25 mg Rectal BID  . lactulose  30 g Oral Daily  . midodrine  7.5 mg Oral TID WC  . nicotine  14 mg Transdermal Daily  . pantoprazole  40 mg Oral BID AC  . polyethylene glycol  17 g Oral BID  . thiamine  100 mg Oral Daily   Continuous Infusions:  PRN Meds:.albuterol, prochlorperazine    Anti-infectives (From admission, onward)   Start     Dose/Rate Route Frequency Ordered Stop   01/18/20 2315  cefTRIAXone (ROCEPHIN) 2 g in sodium chloride 0.9 % 100 mL IVPB  Status:  Discontinued       Note to Pharmacy: Unable to use oral antibiotic that we will not increase QT interval.   2 g 200 mL/hr over 30 Minutes Intravenous Every 24 hours 01/18/20 2302 01/22/20 1402        Objective:   Vitals:   01/24/20 1410 01/24/20 1413 01/24/20 1420 01/24/20 1453  BP: (!) 141/78 122/65 130/69 131/89  Pulse: 70 71 69 70  Resp: 18 18 18 16   Temp:  99 F (37.2 C)  98.6 F (37 C)  TempSrc:  Oral  Oral  SpO2: 100% 100% 100% 100%  Weight:      Height:        Wt Readings from Last 3 Encounters:  01/18/20 80.7 kg  06/29/19 93 kg  06/08/19 98.7 kg     Intake/Output Summary (Last 24 hours) at 01/24/2020 1728 Last data filed at 01/23/2020 1800 Gross per 24 hour  Intake 200 ml  Output --  Net 200 ml     Physical  Exam  Gen:- Awake Alert,  In no apparent distress  HEENT:- [place].AT, No sclera icterus Neck-Supple Neck,No JVD,.  Lungs-  CTAB , fair symmetrical air movement CV- S1, S2 normal, regular  Abd-  +ve B.Sounds, Abd Soft, abdominal distention much improved after paracentesis, not tender Extremity/Skin:- , pedal pulses present  Psych-affect is appropriate, oriented x3 Neuro--generalized weakness without new acute deficits Rectal Exam--- Per GI provider---Mass-like area protruding from the rectum appears consistent with large external hemorrhoid or protruding internal hemorrhoid; non-obstructive but irregular, nodular rectal vault..     Data Review:   Micro Results Recent Results (from the past 240 hour(s))  Resp Panel by RT-PCR (Flu A&B, Covid) Nasopharyngeal Swab     Status: None   Collection Time: 01/18/20  8:27 PM   Specimen: Nasopharyngeal Swab; Nasopharyngeal(NP) swabs in vial transport medium  Result Value Ref Range Status   SARS Coronavirus 2 by RT PCR NEGATIVE NEGATIVE Final    Comment: (NOTE) SARS-CoV-2 target nucleic acids are NOT DETECTED.  The SARS-CoV-2 RNA is generally detectable in upper respiratory specimens during the acute phase of infection. The lowest concentration of SARS-CoV-2 viral copies this assay can detect is 138 copies/mL. A negative result does not preclude SARS-Cov-2 infection and should not be used as the sole basis for treatment or other patient management decisions. A negative result may occur with  improper specimen collection/handling, submission of specimen other than nasopharyngeal swab, presence of viral mutation(s) within the areas targeted by this assay, and inadequate number of viral copies(<138 copies/mL). A negative result must be combined with clinical observations, patient history, and epidemiological information. The expected result is Negative.  Fact Sheet for Patients:  EntrepreneurPulse.com.au  Fact Sheet for Healthcare  Providers:  IncredibleEmployment.be  This test is no t yet approved or cleared by the [place] FDA and  has been authorized for detection and/or diagnosis of SARS-CoV-2 by FDA under an Emergency Use Authorization (EUA). This EUA will remain  in effect (meaning this test can be used) for the duration of the COVID-19 declaration under Section 564(b)(1) of the Act, 21 U.S.C.section 360bbb-3(b)(1), unless the authorization is terminated  or revoked sooner.       Influenza A by PCR NEGATIVE NEGATIVE Final   Influenza B by PCR NEGATIVE NEGATIVE Final    Comment: (NOTE) The Xpert Xpress SARS-CoV-2/FLU/RSV plus assay is intended as an aid in the diagnosis of influenza from Nasopharyngeal swab specimens and should not be used as a sole basis for treatment. Nasal washings and aspirates are unacceptable for Xpert Xpress SARS-CoV-2/FLU/RSV testing.  Fact Sheet for Patients: EntrepreneurPulse.com.au  Fact Sheet for Healthcare Providers: IncredibleEmployment.be  This test is not yet approved or cleared by the [place] FDA and has been authorized for detection and/or diagnosis of SARS-CoV-2 by FDA under an Emergency Use Authorization (EUA). This EUA will remain in effect (meaning this test can be used) for the duration of the COVID-19 declaration under Section 564(b)(1) of the Act, 21 U.S.C. section 360bbb-3(b)(1), unless the authorization is terminated or revoked.  Performed at [facility], [address] [place], [place] [address]   Gram stain     Status: None   Collection Time: 01/24/20  1:55 PM   Specimen: Peritoneal Washings; Body Fluid  Result Value Ref Range Status   Specimen Description PERITONEAL  Final   Special Requests NONE  Final   Gram Stain   Final    NO ORGANISMS SEEN WBC PRESENT, PREDOMINANTLY MONONUCLEAR CYTOSPIN SMEAR Performed at  [facility], [address], [place], [place] [address]    Report  Status 01/24/2020 FINAL  Final  Culture, body fluid-bottle     Status: None (Preliminary result)   Collection Time: 01/24/20  1:55 PM   Specimen: Peritoneal Washings  Result Value Ref Range Status   Specimen Description PERITONEAL  Final   Special Requests   Final    BOTTLES DRAWN AEROBIC AND ANAEROBIC 10CC Performed at [facility], [address], [place], [place] [address]    Culture PENDING  Incomplete   Report Status PENDING  Incomplete    Radiology Reports CT Chest W Contrast  Result Date: 01/18/2020 CLINICAL  DATA:  Weakness, weight loss, abdominal distension EXAM: CT CHEST, ABDOMEN, AND PELVIS WITH CONTRAST TECHNIQUE: Multidetector CT imaging of the chest, abdomen and pelvis was performed following the standard protocol during bolus administration of intravenous contrast. CONTRAST:  67mL OMNIPAQUE IOHEXOL 300 MG/ML  SOLN COMPARISON:  06/13/2019 FINDINGS: CT CHEST FINDINGS Cardiovascular: The heart is unremarkable without pericardial effusion. Mild atherosclerosis of the aorta without thoracic aortic aneurysm or dissection. Mediastinum/Nodes: No enlarged mediastinal, hilar, or axillary lymph nodes. Thyroid gland, trachea, and esophagus demonstrate no significant findings. Lungs/Pleura: Trace left pleural effusion. No acute airspace disease or pneumothorax. The central airways are patent. Musculoskeletal: No acute or destructive bony lesions. Reconstructed images demonstrate no additional findings. CT ABDOMEN PELVIS FINDINGS Hepatobiliary: Diffuse heterogeneity of the liver is noted. Overall, increased enhancement since prior study where diffuse hepatic steatosis was noted. Findings could reflect underlying hepatitis, please correlate with liver function tests. Gallbladder is unremarkable. Pancreas: Diffuse pancreatic atrophy unchanged. Spleen: Normal in size without focal abnormality. Adrenals/Urinary Tract: Multiple right renal cortical cysts unchanged. Otherwise the kidneys enhance normally.  No urinary tract calculi or obstruction. Bladder is decompressed, which limits its evaluation. The adrenals are normal. Stomach/Bowel: No bowel obstruction or ileus. Mild wall thickening of the centralized small bowel loops is likely due to underlying liver disease and diffuse ascites. Vascular/Lymphatic: Splenic vein, SMV, and portal vein are patent. Small esophageal and gastric varices are seen within the upper abdomen, as well as recanalization of the umbilical vein. Findings are consistent with portal venous hypertension. Minimal atherosclerosis of the distal aorta. No pathologic adenopathy. Reproductive: Prostate is unremarkable. Other: Large volume ascites is noted. Ascites extends into a right inguinal hernia. No bowel herniation. No free intraperitoneal gas. Musculoskeletal: No acute or destructive bony lesions. Reconstructed images demonstrate no additional findings. IMPRESSION: 1. Diffuse heterogeneity of the liver parenchyma consistent with a combination of hepatitis and underlying hepatic steatosis. 2. Large volume ascites. 3. Trace left pleural effusion. 4. Small gastric and esophageal varices. 5. Right inguinal hernia containing peritoneal fat and ascites. No bowel herniation. Electronically Signed   By: Randa Ngo M.D.   On: 01/18/2020 19:51   CT ABDOMEN PELVIS W CONTRAST  Result Date: 01/18/2020 CLINICAL DATA:  Weakness, weight loss, abdominal distension EXAM: CT CHEST, ABDOMEN, AND PELVIS WITH CONTRAST TECHNIQUE: Multidetector CT imaging of the chest, abdomen and pelvis was performed following the standard protocol during bolus administration of intravenous contrast. CONTRAST:  62mL OMNIPAQUE IOHEXOL 300 MG/ML  SOLN COMPARISON:  06/13/2019 FINDINGS: CT CHEST FINDINGS Cardiovascular: The heart is unremarkable without pericardial effusion. Mild atherosclerosis of the aorta without thoracic aortic aneurysm or dissection. Mediastinum/Nodes: No enlarged mediastinal, hilar, or axillary lymph  nodes. Thyroid gland, trachea, and esophagus demonstrate no significant findings. Lungs/Pleura: Trace left pleural effusion. No acute airspace disease or pneumothorax. The central airways are patent. Musculoskeletal: No acute or destructive bony lesions. Reconstructed images demonstrate no additional findings. CT ABDOMEN PELVIS FINDINGS Hepatobiliary: Diffuse heterogeneity of the liver is noted. Overall, increased enhancement since prior study where diffuse hepatic steatosis was noted. Findings could reflect underlying hepatitis, please correlate with liver function tests. Gallbladder is unremarkable. Pancreas: Diffuse pancreatic atrophy unchanged. Spleen: Normal in size without focal abnormality. Adrenals/Urinary Tract: Multiple right renal cortical cysts unchanged. Otherwise the kidneys enhance normally. No urinary tract calculi or obstruction. Bladder is decompressed, which limits its evaluation. The adrenals are normal. Stomach/Bowel: No bowel obstruction or ileus. Mild wall thickening of the centralized small bowel loops is likely due to underlying liver disease and  diffuse ascites. Vascular/Lymphatic: Splenic vein, SMV, and portal vein are patent. Small esophageal and gastric varices are seen within the upper abdomen, as well as recanalization of the umbilical vein. Findings are consistent with portal venous hypertension. Minimal atherosclerosis of the distal aorta. No pathologic adenopathy. Reproductive: Prostate is unremarkable. Other: Large volume ascites is noted. Ascites extends into a right inguinal hernia. No bowel herniation. No free intraperitoneal gas. Musculoskeletal: No acute or destructive bony lesions. Reconstructed images demonstrate no additional findings. IMPRESSION: 1. Diffuse heterogeneity of the liver parenchyma consistent with a combination of hepatitis and underlying hepatic steatosis. 2. Large volume ascites. 3. Trace left pleural effusion. 4. Small gastric and esophageal varices. 5.  Right inguinal hernia containing peritoneal fat and ascites. No bowel herniation. Electronically Signed   By: Randa Ngo M.D.   On: 01/18/2020 19:51   US Paracentesis  Result Date: 01/24/2020 INDICATION: Hepatic steatosis, alcohol abuse, ascites EXAM: ULTRASOUND GUIDED DIAGNOSTIC AND THERAPEUTIC PARACENTESIS MEDICATIONS: None COMPLICATIONS: None immediate PROCEDURE: Informed written consent was obtained from the patient after a discussion of the risks, benefits and alternatives to treatment. A timeout was performed prior to the initiation of the procedure. Initial ultrasound scanning demonstrates a large amount of ascites within the right lower abdominal quadrant. The right lower abdomen was prepped and draped in the usual sterile fashion. 1% lidocaine was used for local anesthesia. Following this, a 5 [place] Yueh catheter was introduced. An ultrasound image was saved for documentation purposes. The paracentesis was performed. The catheter was removed and a dressing was applied. The patient tolerated the procedure well without immediate post procedural complication. Patient received post-procedure intravenous albumin; see nursing notes for details. FINDINGS: A total of approximately 4.7 L of yellow ascitic fluid was removed. Samples were sent to the laboratory as requested by the clinical team. IMPRESSION: Successful ultrasound-guided paracentesis yielding 4.7 liters of peritoneal fluid. Electronically Signed   By: Lavonia Dana M.D.   On: 01/24/2020 15:44   US Paracentesis  Result Date: 01/20/2020 INDICATION: Alcohol abuse, ascites EXAM: ULTRASOUND GUIDED THERAPEUTIC PARACENTESIS MEDICATIONS: None COMPLICATIONS: None immediate PROCEDURE: Informed written consent was obtained from the patient after a discussion of the risks, benefits and alternatives to treatment. A timeout was performed prior to the initiation of the procedure. Initial ultrasound scanning demonstrates a large amount of ascites within the  right lower abdominal quadrant. The right lower abdomen was prepped and draped in the usual sterile fashion. 1% lidocaine was used for local anesthesia. Following this, a 5 [place] Yueh catheter was introduced. An ultrasound image was saved for documentation purposes. The paracentesis was performed. The catheter was removed and a dressing was applied. The patient tolerated the procedure well without immediate post procedural complication. FINDINGS: A total of approximately 4 L of yellow ascitic fluid was removed. Samples were sent to the laboratory as requested by the clinical team. IMPRESSION: Successful ultrasound-guided paracentesis yielding 4 liters of peritoneal fluid. Electronically Signed   By: Lavonia Dana M.D.   On: 01/20/2020 13:13   DG Chest Port 1 View  Result Date: 01/18/2020 CLINICAL DATA:  69 year old male with tachycardia. EXAM: PORTABLE CHEST 1 VIEW COMPARISON:  Chest radiograph dated 06/04/2019. FINDINGS: Shallow inspiration. No focal consolidation, pleural effusion, pneumothorax. The cardiac silhouette is within limits. No acute osseous pathology. IMPRESSION: No active disease. Electronically Signed   By: Anner Crete M.D.   On: 01/18/2020 17:25     CBC Recent Labs  Lab 01/19/20 0534 01/19/20 0534 01/20/20 0439 01/20/20 1417 01/21/20 0534 01/23/20  8889 01/24/20 0654  WBC 5.9  --  5.3  --  7.6 7.4 7.0  HGB 8.3*   < > 7.2* 8.3* 9.4* 11.2* 10.2*  HCT 24.6*   < > 21.1* 24.3* 27.0* 33.6* 29.8*  PLT 178  --  129*  --  131* 151 117*  MCV 96.5  --  96.8  --  94.4 96.6 96.1  MCH 32.5  --  33.0  --  32.9 32.2 32.9  MCHC 33.7  --  34.1  --  34.8 33.3 34.2  RDW 15.4  --  15.3  --  15.5 15.9* 15.7*   < > = values in this interval not displayed.    Chemistries  Recent Labs  Lab 01/18/20 1722 01/18/20 1722 01/19/20 0534 01/20/20 0439 01/21/20 0534 01/23/20 0959 01/24/20 0654  NA 134*   < > 136 137 139 140 135  K 4.0   < > 2.8* 3.7 3.6 3.4* 3.8  CL 106   < > 107 111 111  113* 109  CO2 18*   < > 18* 16* 19* 20* 19*  GLUCOSE 158*   < > 106* 172* 152* 159* 117*  BUN 27*   < > 24* 21 17 18 18   CREATININE 2.00*   < > 1.73* 1.60* 1.66* 1.65* 1.66*  CALCIUM 7.8*   < > 8.0* 8.4* 8.5* 8.1* 7.6*  MG 0.4*  --   --  2.1  --   --   --   AST 72*  --  44*  --  31 34 29  ALT 28  --  22  --  15 16 12   ALKPHOS 125  --  86  --  61 71 62  BILITOT 3.3*  --  3.2*  --  3.5* 3.2* 2.8*   < > = values in this interval not displayed.   ------------------------------------------------------------------------------------------------------------------ No results for input(s): CHOL, HDL, LDLCALC, TRIG, CHOLHDL, LDLDIRECT in the last 72 hours.  Lab Results  Component Value Date   HGBA1C 4.4 (L) 01/19/2020   ------------------------------------------------------------------------------------------------------------------ No results for input(s): TSH, T4TOTAL, T3FREE, THYROIDAB in the last 72 hours.  Invalid input(s): FREET3 ------------------------------------------------------------------------------------------------------------------ No results for input(s): VITAMINB12, FOLATE, FERRITIN, TIBC, IRON, RETICCTPCT in the last 72 hours.  Coagulation profile Recent Labs  Lab 01/18/20 2133 01/21/20 0534 01/23/20 0959 01/24/20 0654  INR 1.7* 2.3* 2.0* 2.1*    No results for input(s): DDIMER in the last 72 hours.  Cardiac Enzymes No results for input(s): CKMB, TROPONINI, MYOGLOBIN in the last 168 hours.  Invalid input(s): CK ------------------------------------------------------------------------------------------------------------------    Component Value Date/Time   BNP 238.0 (H) 06/05/2019 1694   Roxan Hockey M.D on 01/24/2020 at 5:28 PM  Go to [email or web address] - for contact info  [facility] - Office  [phone]

## 2020-01-24 NOTE — Sedation Documentation (Signed)
PT tolerated right sided paracentesis procedure well today and 4.7 Liters of cloudy yellow fluid removed and labs sent for processing. PT's vital signs stable at completion of procedure and pt denies any complaints.

## 2020-01-24 NOTE — Procedures (Signed)
PreOperative Dx: Ascites Postoperative Dx: Ascites Procedure:   US guided paracentesis Radiologist:  Thornton Papas Anesthesia:  10 ml of1% lidocaine Specimen:  4.7 L of YELLOW ascitic fluid EBL:   < 1 ml Complications: nONE

## 2020-01-24 NOTE — Progress Notes (Signed)
Subjective: No abdominal pain, nausea, vomiting. Had a couple of BMs yesterday that were loose/watery. No nocturnal BMs. No BMs so far this morning. Had lactulose and miraLAX this morning. Had hematochezia with 1 BM yesterday. Feels the bleeding is improving. No rectal pain. No black stools. Appetite is better. Ate well this morning. No confusion.   At home, he was having 1-2 loose BMs daily. No nocturnal BMs.   Has been a couple of months since he last drank alcohol.   Objective: Vital signs in last 24 hours: Temp:  [98.2 F (36.8 C)-99.1 F (37.3 C)] 99.1 F (37.3 C) (12/07 0900) Pulse Rate:  [60-67] 67 (12/07 0900) Resp:  [18-20] 18 (12/07 0900) BP: (122-140)/(64-91) 128/84 (12/07 0900) SpO2:  [100 %] 100 % (12/07 0900) Last BM Date: 01/23/20 General:   Alert and oriented, pleasant Head:  Normocephalic and atraumatic. Eyes:  No icterus, sclera clear. Conjuctiva pink.  Abdomen:  Bowel sounds present, distended with moderately tense ascites. Non-tender. No rebound or guarding. No masses appreciated  Msk:  Symmetrical without gross deformities. Normal posture. Extremities:  With trace edema in the ankles.  Neurologic:  Alert and  oriented x4;  grossly normal neurologically. Skin:  Warm and dry, intact without significant lesions.  Cervical Nodes:  No significant cervical adenopathy. Psych:  Alert and cooperative. Normal mood and affect.  Intake/Output from previous day: 12/06 0701 - 12/07 0700 In: 2630 [P.O.:840; I.V.:1790] Out: 630 [Urine:630] Intake/Output this shift: No intake/output data recorded.  Lab Results: Recent Labs    01/23/20 0959 01/24/20 0654  WBC 7.4 7.0  HGB 11.2* 10.2*  HCT 33.6* 29.8*  PLT 151 117*   BMET Recent Labs    01/23/20 0959 01/24/20 0654  NA 140 135  K 3.4* 3.8  CL 113* 109  CO2 20* 19*  GLUCOSE 159* 117*  BUN 18 18  CREATININE 1.65* 1.66*  CALCIUM 8.1* 7.6*   LFT Recent Labs    01/23/20 0959 01/24/20 0654  PROT 6.2*  5.3*  ALBUMIN 3.3* 2.8*  AST 34 29  ALT 16 12  ALKPHOS 71 62  BILITOT 3.2* 2.8*   PT/INR Recent Labs    01/23/20 0959 01/24/20 0654  LABPROT 21.7* 23.2*  INR 2.0* 2.1*   Assessment: 69 year old male with history of ETOH abuse, microcytic anemia, newly diagnosed cirrhosis due to ETOH, admitted with rectal bleeding and anemia.   Anemia: long-standing history of normocytic anemia that is multifactorial in setting of chronic disease, alcohol use, hospitalizations for GI bleeding, receiving 1 unit PRBCs this admission after Hgb declined to 7 range. While inpatient, underwent EGD and colonoscopy. Colonoscopy with poor prep and large amount of semi-solid stool in entire colon, large protruding internal hemorrhoids with fresh oozing from hemorrhoids requiring manual tamponade. EGD with Grade 1 esophageal varices without bleeding, LA Grade B esophagitis, normal stomach, single duodenal polyp s/p biopsy (peptic duodenitis). INR 2.3 on 12/4.  Received 1 dose vitamin K 5 mg orally on 12/4 with INR 2.0 on 12/6, back up to 2.1 today. Hemoglobin up to 11.2 yesterday but back down to 10.2 today.  Reports 1 episode of rectal bleeding yesterday, but overall, he feels rectal bleeding is slowing down.  No additional rectal bleeding as of time of consult this morning around 10 AM. Patient would likely benefit from surgical consult for hemorrhoidectomy as he continues to have low-volume hematochezia chronically. Consider inpatient consult if bleeding persist and hemoglobin doesn't stabilize. If persistent or worsening anemia after hemorrhoids addressed, consider  capsule study.   Cirrhosis: MELD 24 today. Alert and oriented x4. No evidence for encephalopathy. Mild trembling when attempting to assess for asterixis but I suspect this is secondary to deconditioning/weakness rather than asterixis.  Currently receiving lactulose daily. Paracentesis 12/3 yielding 4 L, no fluid analysis.  Repeat para today yielding 4.7 L.   Fluid analysis pending. Unable to start diuretics due to AKI with kidney function not back to baseline.  Will order 25g IV albumin today.  Discussed importance of 2 g sodium diet and alcohol cessation.  Diarrhea: Stool noted in colon on colonoscopy but developed multiple loose stools per nursing staff.  Doubt infections in setting of receiving both Miralax and Lactulose. 3 BMs documented yesterday.  Patient denies any BMs overnight and no BM so far this morning.  Lactulose was held and patient only received 1 dose of MiraLAX yesterday.  He did receive dose of MiraLAX and lactulose this morning. Spoke with nursing staff this evening around 3:30pm and patient hasn't had a BM. Will continue to monitor over night. Query whether patient was having overflow diarrhea related to underlying constipation.   Plan: 1. Paracentesis as scheduled today. Called ultrasound to have labs added on. 2. IV albumin 25 g daily. 3. Oral vitamin K 10 mg today. 4. Continue PPI twice daily. 5. Continue to monitor H/H 6. Monitor for ongoing overt GI bleeding.  7. Hold evening dose of MiraLAX today and monitor for return of diarrhea. 8. Will need surgical consult due to extensive hemorrhoids and not an outpatient banding candidate. Consider inpatient consult if rectal bleeding continues and hemoglobin does not stabilize. 9. Counseled on alcohol cessation. 10. 2 g sodium diet. 11. Repeat CBC, CMP, INR tomorrow. 12. Suspect possible discharge in the next 24 hours.     LOS: 5 days    01/24/2020, 9:52 AM   Aliene Altes, PA-C [facility]

## 2020-01-24 NOTE — Care Management Important Message (Signed)
Important Message  Patient Details  Name: Darren Howard MRN: [id number] Date of Birth: Sep 01, 1950   Medicare Important Message Given:  Yes     [name] 01/24/2020, 1:23 PM

## 2020-01-25 DIAGNOSIS — K7031 Alcoholic cirrhosis of liver with ascites: Secondary | ICD-10-CM

## 2020-01-25 DIAGNOSIS — D649 Anemia, unspecified: Secondary | ICD-10-CM

## 2020-01-25 DIAGNOSIS — K649 Unspecified hemorrhoids: Secondary | ICD-10-CM

## 2020-01-25 LAB — COMPREHENSIVE METABOLIC PANEL
ALT: 13 U/L (ref 0–44)
AST: 28 U/L (ref 15–41)
Albumin: 2.9 g/dL — ABNORMAL LOW (ref 3.5–5.0)
Alkaline Phosphatase: 60 U/L (ref 38–126)
Anion gap: 7 (ref 5–15)
BUN: 17 mg/dL (ref 8–23)
CO2: 19 mmol/L — ABNORMAL LOW (ref 22–32)
Calcium: 7.8 mg/dL — ABNORMAL LOW (ref 8.9–10.3)
Chloride: 109 mmol/L (ref 98–111)
Creatinine, Ser: 1.44 mg/dL — ABNORMAL HIGH (ref 0.61–1.24)
GFR, Estimated: 53 mL/min — ABNORMAL LOW (ref >60)
Glucose, Bld: 128 mg/dL — ABNORMAL HIGH (ref 70–99)
Potassium: 3.6 mmol/L (ref 3.5–5.1)
Sodium: 135 mmol/L (ref 135–145)
Total Bilirubin: 2 mg/dL — ABNORMAL HIGH (ref 0.3–1.2)
Total Protein: 5.1 g/dL — ABNORMAL LOW (ref 6.5–8.1)

## 2020-01-25 LAB — CBC
HCT: 26.1 % — ABNORMAL LOW (ref 39.0–52.0)
Hemoglobin: 9 g/dL — ABNORMAL LOW (ref 13.0–17.0)
MCH: 32.4 pg (ref 26.0–34.0)
MCHC: 34.5 g/dL (ref 30.0–36.0)
MCV: 93.9 fL (ref 80.0–100.0)
Platelets: 88 10*3/uL — ABNORMAL LOW (ref 150–400)
RBC: 2.78 MIL/uL — ABNORMAL LOW (ref 4.22–5.81)
RDW: 15.4 % (ref 11.5–15.5)
WBC: 4.7 10*3/uL (ref 4.0–10.5)
nRBC: 0 % (ref 0.0–0.2)

## 2020-01-25 LAB — GLUCOSE, CAPILLARY
Glucose-Capillary: 109 mg/dL — ABNORMAL HIGH (ref 70–99)
Glucose-Capillary: 109 mg/dL — ABNORMAL HIGH (ref 70–99)
Glucose-Capillary: 133 mg/dL — ABNORMAL HIGH (ref 70–99)
Glucose-Capillary: 141 mg/dL — ABNORMAL HIGH (ref 70–99)
Glucose-Capillary: 143 mg/dL — ABNORMAL HIGH (ref 70–99)
Glucose-Capillary: 171 mg/dL — ABNORMAL HIGH (ref 70–99)

## 2020-01-25 LAB — PROTIME-INR
INR: 2.3 — ABNORMAL HIGH (ref 0.8–1.2)
Prothrombin Time: 24.3 seconds — ABNORMAL HIGH (ref 11.4–15.2)

## 2020-01-25 LAB — CYTOLOGY - NON PAP

## 2020-01-25 MED ORDER — SPIRONOLACTONE 25 MG PO TABS
50.0000 mg | ORAL_TABLET | Freq: Every day | ORAL | Status: DC
  Administered 2020-01-25 – 2020-01-26 (×2): 50 mg via ORAL
  Filled 2020-01-25 (×2): qty 2

## 2020-01-25 MED ORDER — FUROSEMIDE 20 MG PO TABS
20.0000 mg | ORAL_TABLET | Freq: Every day | ORAL | Status: DC
  Administered 2020-01-25 – 2020-01-26 (×2): 20 mg via ORAL
  Filled 2020-01-25 (×2): qty 1

## 2020-01-25 NOTE — Progress Notes (Signed)
Subjective: Feeling well. Eating well. No abdominal pain, nausea, or vomiting. No BM yesterday.  Soft/mushy BM this morning with bright red blood in toilet water. States it wasn't a lot. No melena. No confusion.  Alert and oriented x4.  Discussed with nursing staff who states patient had a soft mushy BMs this morning with no BRBPR or melena.  No reported BMs overnight.  Objective: Vital signs in last 24 hours: Temp:  [97.5 F (36.4 C)-99.2 F (37.3 C)] 98.6 F (37 C) (12/08 0552) Pulse Rate:  [64-74] 66 (12/08 0552) Resp:  [16-20] 20 (12/08 0552) BP: (106-141)/(58-89) 127/70 (12/08 0552) SpO2:  [98 %-100 %] 100 % (12/08 0552) Last BM Date: 01/24/20 General:   Alert and oriented, pleasant, NAD Head:  Normocephalic and atraumatic. Eyes:  No icterus, sclera clear. Conjuctiva pink.  Abdomen:  Bowel sounds present.  Abdomen is distended but soft.  No rebound or guarding.  Msk:  Symmetrical without gross deformities. Normal posture. Extremities:  With trace pedal/ankle edema. Neurologic:  Alert and  oriented x4;  grossly normal neurologically. No asterixis.  Skin:  Warm and dry, intact without significant lesions.  Psych: Normal mood and affect.  Intake/Output from previous day: 12/07 0701 - 12/08 0700 In: 240 [P.O.:240] Out: -  Intake/Output this shift: No intake/output data recorded.  Lab Results: Recent Labs    01/23/20 0959 01/24/20 0654 01/25/20 0531  WBC 7.4 7.0 4.7  HGB 11.2* 10.2* 9.0*  HCT 33.6* 29.8* 26.1*  PLT 151 117* 88*   BMET Recent Labs    01/23/20 0959 01/24/20 0654 01/25/20 0531  NA 140 135 135  K 3.4* 3.8 3.6  CL 113* 109 109  CO2 20* 19* 19*  GLUCOSE 159* 117* 128*  BUN 18 18 17   CREATININE 1.65* 1.66* 1.44*  CALCIUM 8.1* 7.6* 7.8*   LFT Recent Labs    01/23/20 0959 01/24/20 0654 01/25/20 0531  PROT 6.2* 5.3* 5.1*  ALBUMIN 3.3* 2.8* 2.9*  AST 34 29 28  ALT 16 12 13   ALKPHOS 71 62 60  BILITOT 3.2* 2.8* 2.0*   PT/INR Recent  Labs    01/24/20 0654 01/25/20 0516  LABPROT 23.2* 24.3*  INR 2.1* 2.3*   Studies/Results: US Paracentesis  Result Date: 01/24/2020 INDICATION: Hepatic steatosis, alcohol abuse, ascites EXAM: ULTRASOUND GUIDED DIAGNOSTIC AND THERAPEUTIC PARACENTESIS MEDICATIONS: None COMPLICATIONS: None immediate PROCEDURE: Informed written consent was obtained from the patient after a discussion of the risks, benefits and alternatives to treatment. A timeout was performed prior to the initiation of the procedure. Initial ultrasound scanning demonstrates a large amount of ascites within the right lower abdominal quadrant. The right lower abdomen was prepped and draped in the usual sterile fashion. 1% lidocaine was used for local anesthesia. Following this, a 5 [place] Yueh catheter was introduced. An ultrasound image was saved for documentation purposes. The paracentesis was performed. The catheter was removed and a dressing was applied. The patient tolerated the procedure well without immediate post procedural complication. Patient received post-procedure intravenous albumin; see nursing notes for details. FINDINGS: A total of approximately 4.7 L of yellow ascitic fluid was removed. Samples were sent to the laboratory as requested by the clinical team. IMPRESSION: Successful ultrasound-guided paracentesis yielding 4.7 liters of peritoneal fluid. Electronically Signed   By: Lavonia Dana M.D.   On: 01/24/2020 15:44    Assessment: 69 year old male with history of ETOH abuse, microcytic anemia,newly diagnosedcirrhosisdue to ETOH, admitted with rectal bleeding and anemia.   Anemia: Long-standing history of  normocytic anemia that is multifactorial in setting of chronic disease, alcohol use, hospitalizations for GI bleeding. This admission he redeived 1 unit PRBCs after Hgb declined to 7 range.  Posttransfusion hemoglobin 8.3> 9.4> 11.2> 10.2> 9.0 today.  Underwent EGD and colonoscopy this admission. Colonoscopy with poor  prep and large amount of semi-solid stool in entire colon, large protruding internal hemorrhoids with fresh oozing from hemorrhoids requiring manual tamponade. EGD with Grade 1 esophageal varices without bleeding, LA Grade B esophagitis, normal stomach, single duodenal polyp s/p biopsy (peptic duodenitis). Denies any rectal bleeding yesterday and reports one BM today with lower volume bright red blood on toilet water. Nursing staff reports no rectal bleeding today.   It is not clear to me that patient has had any significant bleeding to account for his ongoing decline in hemoglobin.  Query whether he may have been hemoconcentrated accounting for hemoglobin as high as 11.2 as he only received 1 unit PRBCs when hemoglobin was 7.2.  He has not been receiving IV albumin which may account for some of the decline.  Dr. Arnoldo Morale saw patient today and stated surgical management of hemorrhoids is contraindicated given multiple comorbidities.  Afraid that postop bleeding would not be controllable.  Patient will need to continue with topical ointments and avoid constipation/straining.  Unfortunately, in the setting of cirrhosis with elevated INR and thrombocytopenia, patient has increased bleeding risk.  We will continue to monitor hemoglobin for now.  May need to consider small bowel evaluation if hemoglobin continues to decline.  Cirrhosis: MELD stable at 23 today. Alert and oriented x4. No evidence for encephalopathy. Currently receiving lactulose daily. Paracentesis 12/3 yielding 4 L, no fluid analysis.  Repeat para 01/25/2020 yielding 4.7 L, no SBP, gram stain negative.  Abdomen continues to be distended but is fairly soft and without TTP.  Diuretics had not been started thus far due to AKI on admission.  Kidney function is improving with creatinine 1.44 today, down from 2 on admission.  He started receiving IV albumin yesterday.  Currently scheduled to receive 25 g IV albumin every 8 hours.  Discussed with Dr.  Jenetta Downer.  We will start low-dose diuretics with Lasix 20 mg and Aldactone 50 mg daily today. INR continues to be elevated. Today INR is 2.3, received vitamin K 5 mg 12/4 and 10 mg 12/7.  We will monitor for now.  Continue lactulose and coreg.   Diarrhea: Resolved. Likely over flow diarrhea or med effect secondary to lactulose and miraLAX. He has had 1 BM thus far today which patient states was soft/mushy. Currently receiving lactulose 30g daily.  MiraLAX has been discontinued.  Recommend he continue lactulose daily to prevent hepatic encephalopathy in light of his advanced cirrhosis.  Plan: 1.  Start Lasix 20 mg and Aldactone 50 mg daily today. 2.  Repeat CBC, CMP, INR tomorrow. 3.  Monitor for overt GI bleeding. 4.  Continue with IV albumin. 5.  Continue lactulose 30 g daily 6.  Continue Coreg. 7.  Continue PPI twice daily. 8.  Continue Anusol suppository twice daily 9.  2 g sodium diet. 10.  Hopeful discharge in the next 24 hours as long as labs remain stable.   LOS: 6 days    01/25/2020, 12:56 PM   Aliene Altes, [facility]

## 2020-01-25 NOTE — Consult Note (Signed)
Reason for Consult: Bleeding hemorrhoidal disease Referring Physician: Dr. Valinda Party is an 69 y.o. male.  HPI: Patient is a 69 year old black male with a longstanding history of cirrhosis secondary to alcohol abuse who has had intermittent episodes of blood per rectum.  He does have chronic anemia and has undergone multiple blood transfusions in the past.  He recently underwent paracentesis.  Surgery has been asked to assess his hemorrhoidal disease.  Patient states he does get constipated but has not had to push his hemorrhoids back into his rectum.  He has noticed blood on the toilet paper when he wipes himself, sometimes in the bowl.  Past Medical History:  Diagnosis Date  . Alcohol abuse   . Anemia   . DM2 (diabetes mellitus, type 2) ([place]) 02/13/2014  . Duodenal erosion   . Erosive gastropathy   . GI bleed 04/2018  . Glucose intolerance (impaired glucose tolerance)   . Hyperlipemia   . Hypertension   . Noncompliance     Past Surgical History:  Procedure Laterality Date  . BIOPSY  05/17/2018   Procedure: BIOPSY;  Surgeon: Daneil Dolin, MD;  [facility];  Service: Endoscopy;;  . BIOPSY  06/15/2019   Procedure: BIOPSY;  Surgeon: Daneil Dolin, MD;  [facility];  Service: Endoscopy;;  . BIOPSY  01/21/2020   Procedure: BIOPSY;  Surgeon: Harvel Quale, MD;  [facility];  Service: Gastroenterology;;  duodenal polyp;  . CARDIAC CATHETERIZATION    . COLONOSCOPY N/A 06/15/2019   Procedure: COLONOSCOPY;  Surgeon: Daneil Dolin, MD;  [facility];  Service: Endoscopy;  Laterality: N/A;  . COLONOSCOPY WITH PROPOFOL N/A 06/01/2018   inadequate colon prep, hemorrhoids on perianal exam, two 4-6 mm polyps at hepatic flexure, one 18 mm polyp at IC valve s/p piecemeal removal (tubular adenomas). was to have surveillance colonoscopy in 6 months but did not return to the office  . COLONOSCOPY WITH PROPOFOL N/A 01/21/2020    Procedure: COLONOSCOPY WITH PROPOFOL;  Surgeon: Harvel Quale, MD;  [facility];  Service: Gastroenterology;  Laterality: N/A;  . ESOPHAGOGASTRODUODENOSCOPY (EGD) WITH PROPOFOL N/A 05/17/2018   Dr. Gala Romney: normal esophagus, erosive gastropathy s/p biopsy, duodenal erosions, felt to be NSAID effect  . ESOPHAGOGASTRODUODENOSCOPY (EGD) WITH PROPOFOL N/A 01/21/2020   Procedure: ESOPHAGOGASTRODUODENOSCOPY (EGD) WITH PROPOFOL;  Surgeon: Harvel Quale, MD;  [facility];  Service: Gastroenterology;  Laterality: N/A;  . HERNIA REPAIR    . POLYPECTOMY  06/01/2018   Procedure: POLYPECTOMY;  Surgeon: Daneil Dolin, MD;  [facility];  Service: Endoscopy;;  colon    Family History  Problem Relation Age of Onset  . Congestive Heart Failure Mother   . Congestive Heart Failure Father   . Cancer Brother        unknown kind   . Colon cancer Neg Hx   . Colon polyps Neg Hx     Social History:  reports that he has been smoking cigarettes. He has a 15.00 pack-year smoking history. He has never used smokeless tobacco. He reports previous alcohol use of about 2.0 standard drinks of alcohol per week. He reports that he does not use drugs.  Allergies: No Known Allergies  Medications: I have reviewed the patient's current medications.  Results for orders placed or performed during the hospital encounter of 01/18/20 (from the past 48 hour(s))  Glucose, capillary     Status: Abnormal   Collection  Time: 01/23/20  4:31 PM  Result Value Ref Range   Glucose-Capillary 123 (H) 70 - 99 mg/dL    Comment: Glucose reference range applies only to samples taken after fasting for at least 8 hours.  Glucose, capillary     Status: Abnormal   Collection Time: 01/23/20  9:46 PM  Result Value Ref Range   Glucose-Capillary 138 (H) 70 - 99 mg/dL    Comment: Glucose reference range applies only to samples taken after fasting for at least 8 hours.   Comment 1 Notify RN     Comment 2 Document in Chart   Glucose, capillary     Status: Abnormal   Collection Time: 01/24/20 12:33 AM  Result Value Ref Range   Glucose-Capillary 125 (H) 70 - 99 mg/dL    Comment: Glucose reference range applies only to samples taken after fasting for at least 8 hours.   Comment 1 Notify RN    Comment 2 Document in Chart   Glucose, capillary     Status: Abnormal   Collection Time: 01/24/20  5:33 AM  Result Value Ref Range   Glucose-Capillary 118 (H) 70 - 99 mg/dL    Comment: Glucose reference range applies only to samples taken after fasting for at least 8 hours.   Comment 1 Notify RN    Comment 2 Document in Chart   CBC     Status: Abnormal   Collection Time: 01/24/20  6:54 AM  Result Value Ref Range   WBC 7.0 4.0 - 10.5 K/uL   RBC 3.10 (L) 4.22 - 5.81 MIL/uL   Hemoglobin 10.2 (L) 13.0 - 17.0 g/dL   HCT 29.8 (L) 39 - 52 %   MCV 96.1 80.0 - 100.0 fL   MCH 32.9 26.0 - 34.0 pg   MCHC 34.2 30.0 - 36.0 g/dL   RDW 15.7 (H) 11.5 - 15.5 %   Platelets 117 (L) 150 - 400 K/uL    Comment: PLATELET COUNT CONFIRMED BY SMEAR SPECIMEN CHECKED FOR CLOTS    nRBC 0.0 0.0 - 0.2 %    Comment: Performed at [facility], [address] [place], [place] [address]  Comprehensive metabolic panel     Status: Abnormal   Collection Time: 01/24/20  6:54 AM  Result Value Ref Range   Sodium 135 135 - 145 mmol/L   Potassium 3.8 3.5 - 5.1 mmol/L   Chloride 109 98 - 111 mmol/L   CO2 19 (L) 22 - 32 mmol/L   Glucose, Bld 117 (H) 70 - 99 mg/dL    Comment: Glucose reference range applies only to samples taken after fasting for at least 8 hours.   BUN 18 8 - 23 mg/dL   Creatinine, Ser 1.66 (H) 0.61 - 1.24 mg/dL   Calcium 7.6 (L) 8.9 - 10.3 mg/dL   Total Protein 5.3 (L) 6.5 - 8.1 g/dL   Albumin 2.8 (L) 3.5 - 5.0 g/dL   AST 29 15 - 41 U/L   ALT 12 0 - 44 U/L   Alkaline Phosphatase 62 38 - 126 U/L   Total Bilirubin 2.8 (H) 0.3 - 1.2 mg/dL   GFR, Estimated 45 (L) >60 mL/min    Comment:  (NOTE) Calculated using the CKD-EPI Creatinine Equation (2021)    Anion gap 7 5 - 15    Comment: Performed at [facility], [address] [place], [place] [address]  Protime-INR     Status: Abnormal   Collection Time: 01/24/20  6:54 AM  Result Value Ref Range  Prothrombin Time 23.2 (H) 11.4 - 15.2 seconds   INR 2.1 (H) 0.8 - 1.2    Comment: (NOTE) INR goal varies based on device and disease states. Performed at [facility], [address] [place], [place] [address]   Glucose, capillary     Status: None   Collection Time: 01/24/20  8:02 AM  Result Value Ref Range   Glucose-Capillary 97 70 - 99 mg/dL    Comment: Glucose reference range applies only to samples taken after fasting for at least 8 hours.  Body fluid cell count with differential     Status: Abnormal   Collection Time: 01/24/20  1:55 PM  Result Value Ref Range   Fluid Type-FCT PERITONEAL     Comment: CORRECTED ON 12/07 AT 1435: PREVIOUSLY REPORTED AS Peritoneal   Color, Fluid YELLOW YELLOW   Appearance, Fluid CLEAR (A) CLEAR   Total Nucleated Cell Count, Fluid 136 0 - 1,000 cu mm   Neutrophil Count, Fluid 4 0 - 25 %   Lymphs, Fluid 22 %   Monocyte-Macrophage-Serous Fluid 74 50 - 90 %   Eos, Fluid 0 %   Other Cells, Fluid FEW MESOTHELIALS %    Comment: Performed at [facility], [address] [place], [place] [address]  Gram stain     Status: None   Collection Time: 01/24/20  1:55 PM   Specimen: Peritoneal Washings; Body Fluid  Result Value Ref Range   Specimen Description PERITONEAL    Special Requests NONE    Gram Stain      NO ORGANISMS SEEN WBC PRESENT, PREDOMINANTLY MONONUCLEAR CYTOSPIN SMEAR Performed at [facility], [address], [place], [place] [address]    Report Status 01/24/2020 FINAL   Lactate dehydrogenase (pleural or peritoneal fluid)     Status: Abnormal   Collection Time: 01/24/20  1:55 PM  Result Value Ref Range   LD, Fluid 44 (H) 3 - 23 U/L    Comment: (NOTE) Results should be  evaluated in conjunction with serum values    Fluid Type-FLDH PERITONEAL CAVITY     Comment: Performed at [facility], [address], [place], [place] [address]  Protein, pleural or peritoneal fluid     Status: None   Collection Time: 01/24/20  1:55 PM  Result Value Ref Range   Total protein, fluid <3.0 g/dL   Fluid Type-FTP PERITONEAL     Comment: Performed at [facility], [address], [place], [place] [address]  Culture, body fluid-bottle     Status: None (Preliminary result)   Collection Time: 01/24/20  1:55 PM   Specimen: Peritoneal Washings  Result Value Ref Range   Specimen Description PERITONEAL    Special Requests      BOTTLES DRAWN AEROBIC AND ANAEROBIC 10CC Performed at [facility], [address], [place], [place] [address]    Culture PENDING    Report Status PENDING   Glucose, capillary     Status: Abnormal   Collection Time: 01/24/20  4:23 PM  Result Value Ref Range   Glucose-Capillary 131 (H) 70 - 99 mg/dL    Comment: Glucose reference range applies only to samples taken after fasting for at least 8 hours.   Comment 1 Notify RN    Comment 2 Document in Chart   Glucose, capillary     Status: Abnormal   Collection Time: 01/25/20 12:07 AM  Result Value Ref Range   Glucose-Capillary 133 (H) 70 - 99 mg/dL    Comment: Glucose reference range applies only to  samples taken after fasting for at least 8 hours.   Comment 1 Notify RN    Comment 2 Document in Chart   Protime-INR     Status: Abnormal   Collection Time: 01/25/20  5:16 AM  Result Value Ref Range   Prothrombin Time 24.3 (H) 11.4 - 15.2 seconds   INR 2.3 (H) 0.8 - 1.2    Comment: (NOTE) INR goal varies based on device and disease states. Performed at [facility], [address] [place], [place] [address]   CBC     Status: Abnormal   Collection Time: 01/25/20  5:31 AM  Result Value Ref Range   WBC 4.7 4.0 - 10.5 K/uL   RBC 2.78 (L) 4.22 - 5.81 MIL/uL   Hemoglobin 9.0 (L) 13.0 - 17.0 g/dL   HCT 26.1  (L) 39 - 52 %   MCV 93.9 80.0 - 100.0 fL   MCH 32.4 26.0 - 34.0 pg   MCHC 34.5 30.0 - 36.0 g/dL   RDW 15.4 11.5 - 15.5 %   Platelets 88 (L) 150 - 400 K/uL    Comment: PLATELET COUNT CONFIRMED BY SMEAR SPECIMEN CHECKED FOR CLOTS Immature Platelet Fraction may be clinically indicated, consider ordering this additional test [id number]    nRBC 0.0 0.0 - 0.2 %    Comment: Performed at [facility], [address] [place], [place] [address]  Comprehensive metabolic panel     Status: Abnormal   Collection Time: 01/25/20  5:31 AM  Result Value Ref Range   Sodium 135 135 - 145 mmol/L   Potassium 3.6 3.5 - 5.1 mmol/L   Chloride 109 98 - 111 mmol/L   CO2 19 (L) 22 - 32 mmol/L   Glucose, Bld 128 (H) 70 - 99 mg/dL    Comment: Glucose reference range applies only to samples taken after fasting for at least 8 hours.   BUN 17 8 - 23 mg/dL   Creatinine, Ser 1.44 (H) 0.61 - 1.24 mg/dL   Calcium 7.8 (L) 8.9 - 10.3 mg/dL   Total Protein 5.1 (L) 6.5 - 8.1 g/dL   Albumin 2.9 (L) 3.5 - 5.0 g/dL   AST 28 15 - 41 U/L   ALT 13 0 - 44 U/L   Alkaline Phosphatase 60 38 - 126 U/L   Total Bilirubin 2.0 (H) 0.3 - 1.2 mg/dL   GFR, Estimated 53 (L) >60 mL/min    Comment: (NOTE) Calculated using the CKD-EPI Creatinine Equation (2021)    Anion gap 7 5 - 15    Comment: Performed at [facility], [address], [place],  [address]  Glucose, capillary     Status: Abnormal   Collection Time: 01/25/20  5:51 AM  Result Value Ref Range   Glucose-Capillary 109 (H) 70 - 99 mg/dL    Comment: Glucose reference range applies only to samples taken after fasting for at least 8 hours.  Glucose, capillary     Status: Abnormal   Collection Time: 01/25/20  8:37 AM  Result Value Ref Range   Glucose-Capillary 109 (H) 70 - 99 mg/dL    Comment: Glucose reference range applies only to samples taken after fasting for at least 8 hours.    US Paracentesis  Result Date: 01/24/2020 INDICATION: Hepatic steatosis, alcohol  abuse, ascites EXAM: ULTRASOUND GUIDED DIAGNOSTIC AND THERAPEUTIC PARACENTESIS MEDICATIONS: None COMPLICATIONS: None immediate PROCEDURE: Informed written consent was obtained from the patient after a discussion of the risks, benefits and alternatives to treatment. A timeout was performed prior  to the initiation of the procedure. Initial ultrasound scanning demonstrates a large amount of ascites within the right lower abdominal quadrant. The right lower abdomen was prepped and draped in the usual sterile fashion. 1% lidocaine was used for local anesthesia. Following this, a 5 [place] Yueh catheter was introduced. An ultrasound image was saved for documentation purposes. The paracentesis was performed. The catheter was removed and a dressing was applied. The patient tolerated the procedure well without immediate post procedural complication. Patient received post-procedure intravenous albumin; see nursing notes for details. FINDINGS: A total of approximately 4.7 L of yellow ascitic fluid was removed. Samples were sent to the laboratory as requested by the clinical team. IMPRESSION: Successful ultrasound-guided paracentesis yielding 4.7 liters of peritoneal fluid. Electronically Signed   By: Lavonia Dana M.D.   On: 01/24/2020 15:44    ROS:  Pertinent items are noted in HPI.  Blood pressure 127/70, pulse 66, temperature 98.6 F (37 C), temperature source Oral, resp. rate 20, height 6\' 1"  (1.854 m), weight 80.7 kg, SpO2 100 %. Physical Exam: Pleasant black male no acute distress Head is normocephalic, atraumatic Lungs clear to auscultation with good breath sounds bilaterally Heart examination reveals a regular rate and rhythm without S3, S4, murmurs Rectal examination reveals protruding internal and external hemorrhoids.  No active bleeding is noted at the present time.  Labs reviewed.  INR today is 2.3.  Hematocrit 26.  Platelet count 88,000.  Assessment/Plan: Impression: Intermittent hemorrhoidal  bleeding in the setting of noncompensated cirrhosis, alcohol abuse, thrombocytopenia, auto coagulopathy.  Hemorrhoidectomy is contraindicated at this time given his multiple comorbidities.  I am afraid that any postop bleeding would not be controllable.  Would maximize topical ointments and instruct patient on how to avoid straining when moving his bowels.  Okay for discharge from surgery standpoint.  Aviva Signs 01/25/2020, 10:58 AM

## 2020-01-25 NOTE — Care Management Important Message (Signed)
Important Message  Patient Details  Name: Darren Howard MRN: [id number] Date of Birth: [birth year]   Medicare Important Message Given:  Yes     [name] 01/25/2020, 12:32 PM

## 2020-01-25 NOTE — Progress Notes (Signed)
Patient Demographics:    Darren Howard, is a 69 y.o. male, DOB - 1950-09-20, SMO:[id number]  Admit date - 01/18/2020   Admitting Physician [name], MD  Outpatient Primary MD for the patient is [name], MD  LOS - 6   Chief Complaint  Patient presents with  . Weakness        Subjective:    [name] today has no fevers, no emesis,  No chest pain,   - -HAd paracentesis with 4.7 L removed today -Occasional dizzy spells -Diarrhea has improved -Due to concerns about hemodynamic instability GI service initiated IV albumin -   Assessment  & Plan :    Principal Problem:   AKI (acute kidney injury) ([place]) Active Problems:   Hypertension   Normocytic anemia   DM2 (diabetes mellitus, type 2) (HCC)   Hypomagnesemia   Alcohol abuse   Tobacco use   HLD (hyperlipidemia)   Hepatorenal syndrome (HCC)   Prolonged QT interval   Abdominal ascites   Generalized weakness   Hepatitis   Alcoholic cirrhosis of liver with ascites (HCC)   Bleeding hemorrhoids  Brief Summary:-  69 y.o. male with medical history significant of alcohol abuse, tobacco abuse, microcytic anemia, type II DM, history of upper GI bleed, duodenal erosion, erosive gastropathy, hyperlipidemia, hypertension, noncompliance of medications admitted with concerns for AKI in the setting of diarrhea and rectal bleeding  A/p 1)AKI----acute kidney injury suspect due to dehydration in setting of GI losses with diarrhea and possibly some component of hepatorenal syndrome-   creatinine on admission=2.0  , baseline creatinine = 0.8   , creatinine is now= improved to 1.4 ---renally adjust medications, avoid nephrotoxic agents / dehydration  / hypotension  2) persistent diarrhea- large volume hematochezia noted as well. --No further bloody BMs -Colonoscopy from 01/21/2020 consistent with rather large hemorrhoid -c/n Anusol HC -Seen by  general surgery not felt to be a surgical candidate at this time for hemorrhoidectomy -Okay to restart lactulose but discontinue MiraLAX  3)hypokalemia and hypomagnesemia--- mostly due to #2 above replace and recheck -Prolonged QT syndrome noted  4) alcoholic liver cirrhosis with ascites---  ---Patient states he quit drinking back in November 2021 -Continue multivitamin -Completed IV Rocephin for SBP prophylaxis -Status post palliative/therapeutic paracentesis on 01/20/20 with 4 L removed and again on 01/24/20 with 4.7 L removed -INR 2.1--GI service gave vitamin K -T bili is down to 2.8 from 3.5 MELD score is 23 -Started on oral Lasix and Aldactone  5) acute GI bleed heme positive stools in the setting of diarrhea---- had large volume hematochezia noted as well.  Related to hemorrhoid --Please see #2 above   6) acute on chronic normocytic anemia--- hemoglobin dropped initially due to GI bleed/acute blood loss anemia -Hemoglobin was down to 7.2 so patient received 1 unit transfusion of PRBCs -Posttransfusion hemoglobin improved to 11.2 and has since trended back down to 9.0 -Continue to monitor hemoglobin to ensure stability -He has not had any significant visible bleeding  7)Tobacco abuse and COPD--- nicotine patch and bronchodilators-ordered, patient not interested in smoking cessation  8)HTN--hold antihypertensives until more hemodynamically stable especially given GI losses/volume depletion with diarrhea  9)DM2-A1c 5.4, patient is high risk for hypoglycemic episodes, especially given diarrhea -Continue nutritional supplements  10) thrombocytopenia.  Suspect is related to liver disease.  Continue to monitor   Disposition/Need for in-Hospital Stay- patient unable to be discharged at this time due to --continue observation for downtrending hemoglobin.  Need to ensure stability prior to discharge  Status is: Inpatient   Disposition: The patient is from: Home               Anticipated d/c is to: Home with [facility]               Anticipated d/c date is: 1 day              Patient currently is not medically stable to d/c. Barriers: Not Clinically Stable-   Code Status :  -  Code Status: Full Code   Family Communication:   (patient is alert, awake and coherent)   Consults  :  Gi  DVT Prophylaxis  :   - SCDs   SCDs Start: 01/18/20 2120   Lab Results  Component Value Date   PLT 88 (L) 01/25/2020    Inpatient Medications  Scheduled Meds: . carvedilol  3.125 mg Oral BID WC  . folic acid  1 mg Oral Daily  . furosemide  20 mg Oral Daily  . hydrocortisone  25 mg Rectal BID  . lactulose  30 g Oral Daily  . midodrine  7.5 mg Oral TID WC  . nicotine  14 mg Transdermal Daily  . pantoprazole  40 mg Oral BID AC  . spironolactone  50 mg Oral Daily  . thiamine  100 mg Oral Daily   Continuous Infusions: . albumin human 25 g (01/25/20 1443)   PRN Meds:.albuterol, prochlorperazine    Anti-infectives (From admission, onward)   Start     Dose/Rate Route Frequency Ordered Stop   01/18/20 2315  cefTRIAXone (ROCEPHIN) 2 g in sodium chloride 0.9 % 100 mL IVPB  Status:  Discontinued       Note to Pharmacy: Unable to use oral antibiotic that we will not increase QT interval.   2 g 200 mL/hr over 30 Minutes Intravenous Every 24 hours 01/18/20 2302 01/22/20 1402        Objective:   Vitals:   01/24/20 2024 01/24/20 2138 01/25/20 0552 01/25/20 1408  BP:  (!) 106/58 127/70 109/65  Pulse:  64 66 70  Resp:  18 20 19   Temp:  (!) 97.5 F (36.4 C) 98.6 F (37 C) 98.2 F (36.8 C)  TempSrc:  Oral Oral   SpO2: 98% 100% 100% 100%  Weight:      Height:        Wt Readings from Last 3 Encounters:  01/18/20 80.7 kg  06/29/19 93 kg  06/08/19 98.7 kg     Intake/Output Summary (Last 24 hours) at 01/25/2020 2029 Last data filed at 01/25/2020 1503 Gross per 24 hour  Intake 221.64 ml  Output --  Net 221.64 ml     Physical Exam  General exam: Alert, awake,  oriented x 3 Respiratory system: Clear to auscultation. Respiratory effort normal. Cardiovascular system:RRR. No murmurs, rubs, gallops. Gastrointestinal system: Abdomen is distended, soft and nontender. No organomegaly or masses felt. Normal bowel sounds heard. Central nervous system: Alert and oriented. No focal neurological deficits. Extremities: No C/C/E, +pedal pulses Skin: No rashes, lesions or ulcers Psychiatry: Judgement and insight appear normal. Mood & affect appropriate.     Data Review:   Micro Results Recent Results (from the past 240 hour(s))  Resp Panel by RT-PCR (Flu A&B,  Covid) Nasopharyngeal Swab     Status: None   Collection Time: 01/18/20  8:27 PM   Specimen: Nasopharyngeal Swab; Nasopharyngeal(NP) swabs in vial transport medium  Result Value Ref Range Status   SARS Coronavirus 2 by RT PCR NEGATIVE NEGATIVE Final    Comment: (NOTE) SARS-CoV-2 target nucleic acids are NOT DETECTED.  The SARS-CoV-2 RNA is generally detectable in upper respiratory specimens during the acute phase of infection. The lowest concentration of SARS-CoV-2 viral copies this assay can detect is 138 copies/mL. A negative result does not preclude SARS-Cov-2 infection and should not be used as the sole basis for treatment or other patient management decisions. A negative result may occur with  improper specimen collection/handling, submission of specimen other than nasopharyngeal swab, presence of viral mutation(s) within the areas targeted by this assay, and inadequate number of viral copies(<138 copies/mL). A negative result must be combined with clinical observations, patient history, and epidemiological information. The expected result is Negative.  Fact Sheet for Patients:  EntrepreneurPulse.com.au  Fact Sheet for Healthcare Providers:  IncredibleEmployment.be  This test is no t yet approved or cleared by the [place] FDA and  has been  authorized for detection and/or diagnosis of SARS-CoV-2 by FDA under an Emergency Use Authorization (EUA). This EUA will remain  in effect (meaning this test can be used) for the duration of the COVID-19 declaration under Section 564(b)(1) of the Act, 21 U.S.C.section 360bbb-3(b)(1), unless the authorization is terminated  or revoked sooner.       Influenza A by PCR NEGATIVE NEGATIVE Final   Influenza B by PCR NEGATIVE NEGATIVE Final    Comment: (NOTE) The Xpert Xpress SARS-CoV-2/FLU/RSV plus assay is intended as an aid in the diagnosis of influenza from Nasopharyngeal swab specimens and should not be used as a sole basis for treatment. Nasal washings and aspirates are unacceptable for Xpert Xpress SARS-CoV-2/FLU/RSV testing.  Fact Sheet for Patients: EntrepreneurPulse.com.au  Fact Sheet for Healthcare Providers: IncredibleEmployment.be  This test is not yet approved or cleared by the [place] FDA and has been authorized for detection and/or diagnosis of SARS-CoV-2 by FDA under an Emergency Use Authorization (EUA). This EUA will remain in effect (meaning this test can be used) for the duration of the COVID-19 declaration under Section 564(b)(1) of the Act, 21 U.S.C. section 360bbb-3(b)(1), unless the authorization is terminated or revoked.  Performed at [facility], [address], [place], [place] [address]   Gram stain     Status: None   Collection Time: 01/24/20  1:55 PM   Specimen: Peritoneal Washings; Body Fluid  Result Value Ref Range Status   Specimen Description PERITONEAL  Final   Special Requests NONE  Final   Gram Stain   Final    NO ORGANISMS SEEN WBC PRESENT, PREDOMINANTLY MONONUCLEAR CYTOSPIN SMEAR Performed at [facility], [address], [place], [place] [address]    Report Status 01/24/2020 FINAL  Final  Culture, body fluid-bottle     Status: None (Preliminary result)   Collection Time: 01/24/20  1:55 PM    Specimen: Peritoneal Washings  Result Value Ref Range Status   Specimen Description PERITONEAL  Final   Special Requests   Final    BOTTLES DRAWN AEROBIC AND ANAEROBIC 10CC Performed at [facility], [address] [place], [place] [address]    Culture PENDING  Incomplete   Report Status PENDING  Incomplete    Radiology Reports CT Chest W Contrast  Result Date: 01/18/2020 CLINICAL DATA:  Weakness, weight loss, abdominal  distension EXAM: CT CHEST, ABDOMEN, AND PELVIS WITH CONTRAST TECHNIQUE: Multidetector CT imaging of the chest, abdomen and pelvis was performed following the standard protocol during bolus administration of intravenous contrast. CONTRAST:  52mL OMNIPAQUE IOHEXOL 300 MG/ML  SOLN COMPARISON:  06/13/2019 FINDINGS: CT CHEST FINDINGS Cardiovascular: The heart is unremarkable without pericardial effusion. Mild atherosclerosis of the aorta without thoracic aortic aneurysm or dissection. Mediastinum/Nodes: No enlarged mediastinal, hilar, or axillary lymph nodes. Thyroid gland, trachea, and esophagus demonstrate no significant findings. Lungs/Pleura: Trace left pleural effusion. No acute airspace disease or pneumothorax. The central airways are patent. Musculoskeletal: No acute or destructive bony lesions. Reconstructed images demonstrate no additional findings. CT ABDOMEN PELVIS FINDINGS Hepatobiliary: Diffuse heterogeneity of the liver is noted. Overall, increased enhancement since prior study where diffuse hepatic steatosis was noted. Findings could reflect underlying hepatitis, please correlate with liver function tests. Gallbladder is unremarkable. Pancreas: Diffuse pancreatic atrophy unchanged. Spleen: Normal in size without focal abnormality. Adrenals/Urinary Tract: Multiple right renal cortical cysts unchanged. Otherwise the kidneys enhance normally. No urinary tract calculi or obstruction. Bladder is decompressed, which limits its evaluation. The adrenals are normal. Stomach/Bowel: No  bowel obstruction or ileus. Mild wall thickening of the centralized small bowel loops is likely due to underlying liver disease and diffuse ascites. Vascular/Lymphatic: Splenic vein, SMV, and portal vein are patent. Small esophageal and gastric varices are seen within the upper abdomen, as well as recanalization of the umbilical vein. Findings are consistent with portal venous hypertension. Minimal atherosclerosis of the distal aorta. No pathologic adenopathy. Reproductive: Prostate is unremarkable. Other: Large volume ascites is noted. Ascites extends into a right inguinal hernia. No bowel herniation. No free intraperitoneal gas. Musculoskeletal: No acute or destructive bony lesions. Reconstructed images demonstrate no additional findings. IMPRESSION: 1. Diffuse heterogeneity of the liver parenchyma consistent with a combination of hepatitis and underlying hepatic steatosis. 2. Large volume ascites. 3. Trace left pleural effusion. 4. Small gastric and esophageal varices. 5. Right inguinal hernia containing peritoneal fat and ascites. No bowel herniation. Electronically Signed   By: Randa Ngo M.D.   On: 01/18/2020 19:51   CT ABDOMEN PELVIS W CONTRAST  Result Date: 01/18/2020 CLINICAL DATA:  Weakness, weight loss, abdominal distension EXAM: CT CHEST, ABDOMEN, AND PELVIS WITH CONTRAST TECHNIQUE: Multidetector CT imaging of the chest, abdomen and pelvis was performed following the standard protocol during bolus administration of intravenous contrast. CONTRAST:  70mL OMNIPAQUE IOHEXOL 300 MG/ML  SOLN COMPARISON:  06/13/2019 FINDINGS: CT CHEST FINDINGS Cardiovascular: The heart is unremarkable without pericardial effusion. Mild atherosclerosis of the aorta without thoracic aortic aneurysm or dissection. Mediastinum/Nodes: No enlarged mediastinal, hilar, or axillary lymph nodes. Thyroid gland, trachea, and esophagus demonstrate no significant findings. Lungs/Pleura: Trace left pleural effusion. No acute airspace  disease or pneumothorax. The central airways are patent. Musculoskeletal: No acute or destructive bony lesions. Reconstructed images demonstrate no additional findings. CT ABDOMEN PELVIS FINDINGS Hepatobiliary: Diffuse heterogeneity of the liver is noted. Overall, increased enhancement since prior study where diffuse hepatic steatosis was noted. Findings could reflect underlying hepatitis, please correlate with liver function tests. Gallbladder is unremarkable. Pancreas: Diffuse pancreatic atrophy unchanged. Spleen: Normal in size without focal abnormality. Adrenals/Urinary Tract: Multiple right renal cortical cysts unchanged. Otherwise the kidneys enhance normally. No urinary tract calculi or obstruction. Bladder is decompressed, which limits its evaluation. The adrenals are normal. Stomach/Bowel: No bowel obstruction or ileus. Mild wall thickening of the centralized small bowel loops is likely due to underlying liver disease and diffuse ascites. Vascular/Lymphatic: Splenic vein, SMV,  and portal vein are patent. Small esophageal and gastric varices are seen within the upper abdomen, as well as recanalization of the umbilical vein. Findings are consistent with portal venous hypertension. Minimal atherosclerosis of the distal aorta. No pathologic adenopathy. Reproductive: Prostate is unremarkable. Other: Large volume ascites is noted. Ascites extends into a right inguinal hernia. No bowel herniation. No free intraperitoneal gas. Musculoskeletal: No acute or destructive bony lesions. Reconstructed images demonstrate no additional findings. IMPRESSION: 1. Diffuse heterogeneity of the liver parenchyma consistent with a combination of hepatitis and underlying hepatic steatosis. 2. Large volume ascites. 3. Trace left pleural effusion. 4. Small gastric and esophageal varices. 5. Right inguinal hernia containing peritoneal fat and ascites. No bowel herniation. Electronically Signed   By: Randa Ngo M.D.   On: 01/18/2020  19:51   US Paracentesis  Result Date: 01/24/2020 INDICATION: Hepatic steatosis, alcohol abuse, ascites EXAM: ULTRASOUND GUIDED DIAGNOSTIC AND THERAPEUTIC PARACENTESIS MEDICATIONS: None COMPLICATIONS: None immediate PROCEDURE: Informed written consent was obtained from the patient after a discussion of the risks, benefits and alternatives to treatment. A timeout was performed prior to the initiation of the procedure. Initial ultrasound scanning demonstrates a large amount of ascites within the right lower abdominal quadrant. The right lower abdomen was prepped and draped in the usual sterile fashion. 1% lidocaine was used for local anesthesia. Following this, a 5 [place] Yueh catheter was introduced. An ultrasound image was saved for documentation purposes. The paracentesis was performed. The catheter was removed and a dressing was applied. The patient tolerated the procedure well without immediate post procedural complication. Patient received post-procedure intravenous albumin; see nursing notes for details. FINDINGS: A total of approximately 4.7 L of yellow ascitic fluid was removed. Samples were sent to the laboratory as requested by the clinical team. IMPRESSION: Successful ultrasound-guided paracentesis yielding 4.7 liters of peritoneal fluid. Electronically Signed   By: Lavonia Dana M.D.   On: 01/24/2020 15:44   US Paracentesis  Result Date: 01/20/2020 INDICATION: Alcohol abuse, ascites EXAM: ULTRASOUND GUIDED THERAPEUTIC PARACENTESIS MEDICATIONS: None COMPLICATIONS: None immediate PROCEDURE: Informed written consent was obtained from the patient after a discussion of the risks, benefits and alternatives to treatment. A timeout was performed prior to the initiation of the procedure. Initial ultrasound scanning demonstrates a large amount of ascites within the right lower abdominal quadrant. The right lower abdomen was prepped and draped in the usual sterile fashion. 1% lidocaine was used for local  anesthesia. Following this, a 5 [place] Yueh catheter was introduced. An ultrasound image was saved for documentation purposes. The paracentesis was performed. The catheter was removed and a dressing was applied. The patient tolerated the procedure well without immediate post procedural complication. FINDINGS: A total of approximately 4 L of yellow ascitic fluid was removed. Samples were sent to the laboratory as requested by the clinical team. IMPRESSION: Successful ultrasound-guided paracentesis yielding 4 liters of peritoneal fluid. Electronically Signed   By: Lavonia Dana M.D.   On: 01/20/2020 13:13   DG Chest Port 1 View  Result Date: 01/18/2020 CLINICAL DATA:  69 year old male with tachycardia. EXAM: PORTABLE CHEST 1 VIEW COMPARISON:  Chest radiograph dated 06/04/2019. FINDINGS: Shallow inspiration. No focal consolidation, pleural effusion, pneumothorax. The cardiac silhouette is within limits. No acute osseous pathology. IMPRESSION: No active disease. Electronically Signed   By: Anner Crete M.D.   On: 01/18/2020 17:25     CBC Recent Labs  Lab 01/20/20 0439 01/20/20 0439 01/20/20 1417 01/21/20 0534 01/23/20 0959 01/24/20 0654 01/25/20 0531  WBC 5.3  --   --  7.6 7.4 7.0 4.7  HGB 7.2*   < > 8.3* 9.4* 11.2* 10.2* 9.0*  HCT 21.1*   < > 24.3* 27.0* 33.6* 29.8* 26.1*  PLT 129*  --   --  131* 151 117* 88*  MCV 96.8  --   --  94.4 96.6 96.1 93.9  MCH 33.0  --   --  32.9 32.2 32.9 32.4  MCHC 34.1  --   --  34.8 33.3 34.2 34.5  RDW 15.3  --   --  15.5 15.9* 15.7* 15.4   < > = values in this interval not displayed.    Chemistries  Recent Labs  Lab 01/19/20 0534 01/19/20 0534 01/20/20 0439 01/21/20 0534 01/23/20 0959 01/24/20 0654 01/25/20 0531  NA 136   < > 137 139 140 135 135  K 2.8*   < > 3.7 3.6 3.4* 3.8 3.6  CL 107   < > 111 111 113* 109 109  CO2 18*   < > 16* 19* 20* 19* 19*  GLUCOSE 106*   < > 172* 152* 159* 117* 128*  BUN 24*   < > 21 17 18 18 17   CREATININE 1.73*    < > 1.60* 1.66* 1.65* 1.66* 1.44*  CALCIUM 8.0*   < > 8.4* 8.5* 8.1* 7.6* 7.8*  MG  --   --  2.1  --   --   --   --   AST 44*  --   --  31 34 29 28  ALT 22  --   --  15 16 12 13   ALKPHOS 86  --   --  61 71 62 60  BILITOT 3.2*  --   --  3.5* 3.2* 2.8* 2.0*   < > = values in this interval not displayed.   ------------------------------------------------------------------------------------------------------------------ No results for input(s): CHOL, HDL, LDLCALC, TRIG, CHOLHDL, LDLDIRECT in the last 72 hours.  Lab Results  Component Value Date   HGBA1C 4.4 (L) 01/19/2020   ------------------------------------------------------------------------------------------------------------------ No results for input(s): TSH, T4TOTAL, T3FREE, THYROIDAB in the last 72 hours.  Invalid input(s): FREET3 ------------------------------------------------------------------------------------------------------------------ No results for input(s): VITAMINB12, FOLATE, FERRITIN, TIBC, IRON, RETICCTPCT in the last 72 hours.  Coagulation profile Recent Labs  Lab 01/18/20 2133 01/21/20 0534 01/23/20 0959 01/24/20 0654 01/25/20 0516  INR 1.7* 2.3* 2.0* 2.1* 2.3*    No results for input(s): DDIMER in the last 72 hours.  Cardiac Enzymes No results for input(s): CKMB, TROPONINI, MYOGLOBIN in the last 168 hours.  Invalid input(s): CK ------------------------------------------------------------------------------------------------------------------    Component Value Date/Time   BNP 238.0 (H) 06/05/2019 0421   Kathie Dike M.D on 01/25/2020 at 8:29 PM  Go to [email or web address] - for contact info  [facility] - Office  [phone]

## 2020-01-26 ENCOUNTER — Other Ambulatory Visit: Payer: Self-pay

## 2020-01-26 ENCOUNTER — Telehealth: Payer: Self-pay | Admitting: Gastroenterology

## 2020-01-26 ENCOUNTER — Encounter: Payer: Self-pay | Admitting: Gastroenterology

## 2020-01-26 DIAGNOSIS — K767 Hepatorenal syndrome: Secondary | ICD-10-CM

## 2020-01-26 DIAGNOSIS — N179 Acute kidney failure, unspecified: Secondary | ICD-10-CM

## 2020-01-26 LAB — GLUCOSE, CAPILLARY
Glucose-Capillary: 112 mg/dL — ABNORMAL HIGH (ref 70–99)
Glucose-Capillary: 129 mg/dL — ABNORMAL HIGH (ref 70–99)
Glucose-Capillary: 107 mg/dL — ABNORMAL HIGH (ref 70–99)

## 2020-01-26 LAB — CBC
HCT: 24.5 % — ABNORMAL LOW (ref 39.0–52.0)
HCT: 28.4 % — ABNORMAL LOW (ref 39.0–52.0)
Hemoglobin: 8.4 g/dL — ABNORMAL LOW (ref 13.0–17.0)
Hemoglobin: 9.4 g/dL — ABNORMAL LOW (ref 13.0–17.0)
MCH: 32.4 pg (ref 26.0–34.0)
MCH: 32.1 pg (ref 26.0–34.0)
MCHC: 34.3 g/dL (ref 30.0–36.0)
MCHC: 33.1 g/dL (ref 30.0–36.0)
MCV: 94.6 fL (ref 80.0–100.0)
MCV: 96.9 fL (ref 80.0–100.0)
Platelets: 79 K/uL — ABNORMAL LOW (ref 150–400)
Platelets: 100 K/uL — ABNORMAL LOW (ref 150–400)
RBC: 2.59 MIL/uL — ABNORMAL LOW (ref 4.22–5.81)
RBC: 2.93 MIL/uL — ABNORMAL LOW (ref 4.22–5.81)
RDW: 15.3 % (ref 11.5–15.5)
RDW: 15.5 % (ref 11.5–15.5)
WBC: 6.4 K/uL (ref 4.0–10.5)
WBC: 8.3 K/uL (ref 4.0–10.5)
nRBC: 0 % (ref 0.0–0.2)
nRBC: 0 % (ref 0.0–0.2)

## 2020-01-26 LAB — COMPREHENSIVE METABOLIC PANEL
ALT: 14 U/L (ref 0–44)
AST: 33 U/L (ref 15–41)
Albumin: 3 g/dL — ABNORMAL LOW (ref 3.5–5.0)
Alkaline Phosphatase: 51 U/L (ref 38–126)
Anion gap: 8 (ref 5–15)
BUN: 15 mg/dL (ref 8–23)
CO2: 19 mmol/L — ABNORMAL LOW (ref 22–32)
Calcium: 8 mg/dL — ABNORMAL LOW (ref 8.9–10.3)
Chloride: 109 mmol/L (ref 98–111)
Creatinine, Ser: 1.46 mg/dL — ABNORMAL HIGH (ref 0.61–1.24)
GFR, Estimated: 52 mL/min — ABNORMAL LOW (ref >60)
Glucose, Bld: 155 mg/dL — ABNORMAL HIGH (ref 70–99)
Potassium: 3.5 mmol/L (ref 3.5–5.1)
Sodium: 136 mmol/L (ref 135–145)
Total Bilirubin: 2.3 mg/dL — ABNORMAL HIGH (ref 0.3–1.2)
Total Protein: 5.1 g/dL — ABNORMAL LOW (ref 6.5–8.1)

## 2020-01-26 LAB — PROTIME-INR
INR: 2.1 — ABNORMAL HIGH (ref 0.8–1.2)
Prothrombin Time: 22.7 seconds — ABNORMAL HIGH (ref 11.4–15.2)

## 2020-01-26 LAB — LACTATE DEHYDROGENASE: LDH: 113 U/L (ref 98–192)

## 2020-01-26 MED ORDER — LACTULOSE 10 GM/15ML PO SOLN
30.0000 g | Freq: Every day | ORAL | 0 refills | Status: DC
Start: 2020-01-27 — End: 2020-03-01

## 2020-01-26 MED ORDER — SPIRONOLACTONE 50 MG PO TABS
50.0000 mg | ORAL_TABLET | Freq: Every day | ORAL | 1 refills | Status: DC
Start: 2020-01-27 — End: 2020-03-01

## 2020-01-26 MED ORDER — HYDROCORTISONE ACETATE 25 MG RE SUPP: 25.0000 mg | Freq: Two times a day (BID) | RECTAL | 0 refills | Status: AC

## 2020-01-26 MED ORDER — MIDODRINE HCL 2.5 MG PO TABS: 7.5000 mg | ORAL_TABLET | Freq: Three times a day (TID) | ORAL | 1 refills | Status: AC

## 2020-01-26 MED ORDER — CARVEDILOL 3.125 MG PO TABS: 3.1250 mg | ORAL_TABLET | Freq: Two times a day (BID) | ORAL | 1 refills | Status: AC

## 2020-01-26 MED ORDER — FUROSEMIDE 20 MG PO TABS: 20.0000 mg | ORAL_TABLET | Freq: Every day | ORAL | 1 refills | Status: AC

## 2020-01-26 NOTE — TOC Transition Note (Signed)
Transition of Care [facility]) - CM/SW Discharge Note   Patient Details  Name: Darren Howard MRN: [id number] Date of Birth: 05-14-1950  [facility]) CM/SW Contact:  Natasha Bence, LCSW Phone Number: 01/26/2020, 4:32 PM   Clinical Narrative:    CSW observed patient's D/C order. CSW notified Vaughan Basta with [facility] of patient's discharge. Vaughan Basta agreeable to provide services upon d/c. Patient's readmission score identified as high. Readmission Risk assessment complete. TOC signing off.   Final next level of care: [place] Barriers to Discharge: Barriers Resolved   Patient Goals and CMS Choice Patient states their goals for this hospitalization and ongoing recovery are:: go home CMS Medicare.gov Compare Post Acute Care list provided to:: Patient Choice offered to / list presented to : Patient  Discharge Placement                    Patient and family notified of of transfer: 01/26/20  Discharge Plan and Services In-house Referral: Clinical Social Work Discharge Planning Services: Follow-up appt scheduled Post Acute Care Choice: Home Health                    HH Arranged: RN,PT [place] Agency: [place] ([facility]) Date [place]: 01/26/20 Time [place]: 1624 Representative spoke with at [place]: [place] Determinants of Health ([place]) Interventions     Readmission Risk Interventions Readmission Risk Prevention Plan 01/26/2020 01/23/2020 05/18/2018  Post Dischage Appt - - Complete  Medication Screening - - Complete  Transportation Screening Complete Complete Complete  PCP or Specialist Appt within 5-7 Days - - -  PCP or Specialist Appt within 3-5 Days Complete Complete -  Home Care Screening - - -  Medication Review (RN CM) - - -  [place] or Home Care Consult Complete Complete -  Social Work Consult for [place] Planning/Counseling Complete Complete -  Palliative Care Screening Not Applicable Not  Applicable -  Medication Review Press photographer) Complete Complete -  Some recent data might be hidden

## 2020-01-26 NOTE — Telephone Encounter (Signed)
PATIENT SCHEDULED AND NURSE ON 300 CALLED WITH DATE

## 2020-01-26 NOTE — Telephone Encounter (Signed)
Patient will need hospital follow up in 2-3 weeks.  Needs labs next week: CBC, CMET, PT/INR

## 2020-01-26 NOTE — Progress Notes (Signed)
Subjective:  Feels good. Eating well. Denies abdominal pain, n/v. Last BM yesterday. Per nursing no brbpr.   Objective: Vital signs in last 24 hours: Temp:  [98.2 F (36.8 C)-99.4 F (37.4 C)] 99.4 F (37.4 C) (12/09 0649) Pulse Rate:  [70-79] 79 (12/09 0649) Resp:  [19-20] 20 (12/09 0649) BP: (109-130)/(64-68) 115/64 (12/09 0649) SpO2:  [100 %] 100 % (12/09 0649) Last BM Date: 01/25/20 General:   Alert,   pleasant and cooperative in NAD Head:  Normocephalic and atraumatic. Eyes:  Sclera clear, no icterus.  Abdomen:  Soft, nontender, distended but soft. Normal bowel sounds, without guarding, and without rebound.   Extremities:  Trace pedal edema bilaterally. Neurologic:  Alert and  oriented x4;  grossly normal neurologically. Skin:  Intact without significant lesions or rashes. Psych:  Alert and cooperative. Normal mood and affect.  Intake/Output from previous day: 12/08 0701 - 12/09 0700 In: 101.6 [IV Piggyback:101.6] Out: -  Intake/Output this shift: No intake/output data recorded.  Lab Results: CBC Recent Labs    01/24/20 0654 01/25/20 0531 01/26/20 0521  WBC 7.0 4.7 6.4  HGB 10.2* 9.0* 8.4*  HCT 29.8* 26.1* 24.5*  MCV 96.1 93.9 94.6  PLT 117* 88* 79*   BMET Recent Labs    01/24/20 0654 01/25/20 0531 01/26/20 0521  NA 135 135 136  K 3.8 3.6 3.5  CL 109 109 109  CO2 19* 19* 19*  GLUCOSE 117* 128* 155*  BUN 18 17 15   CREATININE 1.66* 1.44* 1.46*  CALCIUM 7.6* 7.8* 8.0*   LFTs Recent Labs    01/24/20 0654 01/25/20 0531 01/26/20 0521  BILITOT 2.8* 2.0* 2.3*  ALKPHOS 62 60 51  AST 29 28 33  ALT 12 13 14   PROT 5.3* 5.1* 5.1*  ALBUMIN 2.8* 2.9* 3.0*   No results for input(s): LIPASE in the last 72 hours. PT/INR Recent Labs    01/24/20 0654 01/25/20 0516 01/26/20 0521  LABPROT 23.2* 24.3* 22.7*  INR 2.1* 2.3* 2.1*      Imaging Studies: CT Chest W Contrast  Result Date: 01/18/2020 CLINICAL DATA:  Weakness, weight loss, abdominal  distension EXAM: CT CHEST, ABDOMEN, AND PELVIS WITH CONTRAST TECHNIQUE: Multidetector CT imaging of the chest, abdomen and pelvis was performed following the standard protocol during bolus administration of intravenous contrast. CONTRAST:  24mL OMNIPAQUE IOHEXOL 300 MG/ML  SOLN COMPARISON:  06/13/2019 FINDINGS: CT CHEST FINDINGS Cardiovascular: The heart is unremarkable without pericardial effusion. Mild atherosclerosis of the aorta without thoracic aortic aneurysm or dissection. Mediastinum/Nodes: No enlarged mediastinal, hilar, or axillary lymph nodes. Thyroid gland, trachea, and esophagus demonstrate no significant findings. Lungs/Pleura: Trace left pleural effusion. No acute airspace disease or pneumothorax. The central airways are patent. Musculoskeletal: No acute or destructive bony lesions. Reconstructed images demonstrate no additional findings. CT ABDOMEN PELVIS FINDINGS Hepatobiliary: Diffuse heterogeneity of the liver is noted. Overall, increased enhancement since prior study where diffuse hepatic steatosis was noted. Findings could reflect underlying hepatitis, please correlate with liver function tests. Gallbladder is unremarkable. Pancreas: Diffuse pancreatic atrophy unchanged. Spleen: Normal in size without focal abnormality. Adrenals/Urinary Tract: Multiple right renal cortical cysts unchanged. Otherwise the kidneys enhance normally. No urinary tract calculi or obstruction. Bladder is decompressed, which limits its evaluation. The adrenals are normal. Stomach/Bowel: No bowel obstruction or ileus. Mild wall thickening of the centralized small bowel loops is likely due to underlying liver disease and diffuse ascites. Vascular/Lymphatic: Splenic vein, SMV, and portal vein are patent. Small esophageal and gastric varices are seen within the  upper abdomen, as well as recanalization of the umbilical vein. Findings are consistent with portal venous hypertension. Minimal atherosclerosis of the distal aorta.  No pathologic adenopathy. Reproductive: Prostate is unremarkable. Other: Large volume ascites is noted. Ascites extends into a right inguinal hernia. No bowel herniation. No free intraperitoneal gas. Musculoskeletal: No acute or destructive bony lesions. Reconstructed images demonstrate no additional findings. IMPRESSION: 1. Diffuse heterogeneity of the liver parenchyma consistent with a combination of hepatitis and underlying hepatic steatosis. 2. Large volume ascites. 3. Trace left pleural effusion. 4. Small gastric and esophageal varices. 5. Right inguinal hernia containing peritoneal fat and ascites. No bowel herniation. Electronically Signed   By: Randa Ngo M.D.   On: 01/18/2020 19:51   CT ABDOMEN PELVIS W CONTRAST  Result Date: 01/18/2020 CLINICAL DATA:  Weakness, weight loss, abdominal distension EXAM: CT CHEST, ABDOMEN, AND PELVIS WITH CONTRAST TECHNIQUE: Multidetector CT imaging of the chest, abdomen and pelvis was performed following the standard protocol during bolus administration of intravenous contrast. CONTRAST:  4mL OMNIPAQUE IOHEXOL 300 MG/ML  SOLN COMPARISON:  06/13/2019 FINDINGS: CT CHEST FINDINGS Cardiovascular: The heart is unremarkable without pericardial effusion. Mild atherosclerosis of the aorta without thoracic aortic aneurysm or dissection. Mediastinum/Nodes: No enlarged mediastinal, hilar, or axillary lymph nodes. Thyroid gland, trachea, and esophagus demonstrate no significant findings. Lungs/Pleura: Trace left pleural effusion. No acute airspace disease or pneumothorax. The central airways are patent. Musculoskeletal: No acute or destructive bony lesions. Reconstructed images demonstrate no additional findings. CT ABDOMEN PELVIS FINDINGS Hepatobiliary: Diffuse heterogeneity of the liver is noted. Overall, increased enhancement since prior study where diffuse hepatic steatosis was noted. Findings could reflect underlying hepatitis, please correlate with liver function tests.  Gallbladder is unremarkable. Pancreas: Diffuse pancreatic atrophy unchanged. Spleen: Normal in size without focal abnormality. Adrenals/Urinary Tract: Multiple right renal cortical cysts unchanged. Otherwise the kidneys enhance normally. No urinary tract calculi or obstruction. Bladder is decompressed, which limits its evaluation. The adrenals are normal. Stomach/Bowel: No bowel obstruction or ileus. Mild wall thickening of the centralized small bowel loops is likely due to underlying liver disease and diffuse ascites. Vascular/Lymphatic: Splenic vein, SMV, and portal vein are patent. Small esophageal and gastric varices are seen within the upper abdomen, as well as recanalization of the umbilical vein. Findings are consistent with portal venous hypertension. Minimal atherosclerosis of the distal aorta. No pathologic adenopathy. Reproductive: Prostate is unremarkable. Other: Large volume ascites is noted. Ascites extends into a right inguinal hernia. No bowel herniation. No free intraperitoneal gas. Musculoskeletal: No acute or destructive bony lesions. Reconstructed images demonstrate no additional findings. IMPRESSION: 1. Diffuse heterogeneity of the liver parenchyma consistent with a combination of hepatitis and underlying hepatic steatosis. 2. Large volume ascites. 3. Trace left pleural effusion. 4. Small gastric and esophageal varices. 5. Right inguinal hernia containing peritoneal fat and ascites. No bowel herniation. Electronically Signed   By: Randa Ngo M.D.   On: 01/18/2020 19:51   US Paracentesis  Result Date: 01/24/2020 INDICATION: Hepatic steatosis, alcohol abuse, ascites EXAM: ULTRASOUND GUIDED DIAGNOSTIC AND THERAPEUTIC PARACENTESIS MEDICATIONS: None COMPLICATIONS: None immediate PROCEDURE: Informed written consent was obtained from the patient after a discussion of the risks, benefits and alternatives to treatment. A timeout was performed prior to the initiation of the procedure. Initial  ultrasound scanning demonstrates a large amount of ascites within the right lower abdominal quadrant. The right lower abdomen was prepped and draped in the usual sterile fashion. 1% lidocaine was used for local anesthesia. Following this, a 5 [place] Yueh catheter  was introduced. An ultrasound image was saved for documentation purposes. The paracentesis was performed. The catheter was removed and a dressing was applied. The patient tolerated the procedure well without immediate post procedural complication. Patient received post-procedure intravenous albumin; see nursing notes for details. FINDINGS: A total of approximately 4.7 L of yellow ascitic fluid was removed. Samples were sent to the laboratory as requested by the clinical team. IMPRESSION: Successful ultrasound-guided paracentesis yielding 4.7 liters of peritoneal fluid. Electronically Signed   By: Lavonia Dana M.D.   On: 01/24/2020 15:44   US Paracentesis  Result Date: 01/20/2020 INDICATION: Alcohol abuse, ascites EXAM: ULTRASOUND GUIDED THERAPEUTIC PARACENTESIS MEDICATIONS: None COMPLICATIONS: None immediate PROCEDURE: Informed written consent was obtained from the patient after a discussion of the risks, benefits and alternatives to treatment. A timeout was performed prior to the initiation of the procedure. Initial ultrasound scanning demonstrates a large amount of ascites within the right lower abdominal quadrant. The right lower abdomen was prepped and draped in the usual sterile fashion. 1% lidocaine was used for local anesthesia. Following this, a 5 [place] Yueh catheter was introduced. An ultrasound image was saved for documentation purposes. The paracentesis was performed. The catheter was removed and a dressing was applied. The patient tolerated the procedure well without immediate post procedural complication. FINDINGS: A total of approximately 4 L of yellow ascitic fluid was removed. Samples were sent to the laboratory as requested by the  clinical team. IMPRESSION: Successful ultrasound-guided paracentesis yielding 4 liters of peritoneal fluid. Electronically Signed   By: Lavonia Dana M.D.   On: 01/20/2020 13:13   DG Chest Port 1 View  Result Date: 01/18/2020 CLINICAL DATA:  69 year old male with tachycardia. EXAM: PORTABLE CHEST 1 VIEW COMPARISON:  Chest radiograph dated 06/04/2019. FINDINGS: Shallow inspiration. No focal consolidation, pleural effusion, pneumothorax. The cardiac silhouette is within limits. No acute osseous pathology. IMPRESSION: No active disease. Electronically Signed   By: Anner Crete M.D.   On: 01/18/2020 17:25  [2 weeks]   Assessment:  69 year old male with history of alcohol abuse, microcytic anemia, newly diagnosed cirrhosis due to alcohol, admitted with rectal bleeding, anemia, abdominal distention.  Patient also complains of diarrhea for a couple of months.  Anemia: Longstanding normocytic anemia, multifactorial in the setting of chronic disease, alcohol abuse, hospitalizations for GI bleeding.  Last week staff noted a large amount of hematochezia.  Hemoglobin typically in the 7-8 range.  This admission presented with hemoglobin was 10.3, the following day was down to 8.3.  He was transfused 1 unit of packed red blood cells on December 3, hemoglobin went up to 9.4.  Then 11.2-->10.2-->9.0--> 8.4.  Really no significant bleeding noted.  His platelets have also declined from 178,000 slightly down to 79,000 today.  White blood cell count normal.  This admission he underwent an EGD with grade 1 esophageal varices without bleeding, LA grade B esophagitis, normal stomach, single duodenal polyp status post biopsy.  Colonoscopy with poor prep and large amount of semisolid stool in the entire colon, large protruding internal hemorrhoids with fresh oozing from hemorrhoids requiring manual tamponade.  Dr. Arnoldo Morale saw patient and stated surgical management of hemorrhoids is contraindicated due to multiple  comorbidities.  Afraid that postop bleeding would not be controllable.  Patient needs to have topical ointments and avoid constipation/straining.  Cirrhosis: First paracentesis this admission with 4 L removed on December 3, no fluid analysis obtained.  Repeat paracentesis December 8 with 4.7 L removed, no SBP, Gram stain negative.  Cultures remain  negative.  Receiving IV albumin 25 g every 8 hours.  Continue Coreg for variceal bleeding prophylaxis. MELD Na 23 today, stable.  No evidence of encephalopathy.  Received vitamin K p.o. 15 mg total this admission  Plan: 1. Continue Lasix 20 mg daily and Aldactone 50 mg daily. 2. Continue Coreg. 3. Continue lactulose, titrate to have 2-3 soft stools daily. 4. Continue PPI twice daily. 5. Continue topical hemorrhoid regimen twice daily. 6. 2 g sodium diet. 7. Discussed with Dr. Roderic Palau.  Overall hemoglobin stable to slightly declined.  Suspect post transfusion hemoglobin of 11.2 likely hemoconcentrated.  Plan to repeat CBC after lunch, decide regarding discharge based on results. 8. Close follow-up as an outpatient planned.  Would recommend CBC,CMP, INR one week post discharge.   Laureen Ochs. Bernarda Caffey [facility] [phone] 12/9/202112:26 PM     LOS: 7 days

## 2020-01-26 NOTE — Progress Notes (Signed)
Physical Therapy Treatment Patient Details Name: Darren Howard MRN: [id number] DOB: 10-26-1950 Today's Date: 01/26/2020    History of Present Illness XAIVIER Howard is a 69 y.o. male with medical history significant of alcohol abuse, tobacco abuse, microcytic anemia, type II DM, history of upper GI bleed, duodenal erosion, erosive gastropathy, hyperlipidemia, hypertension, noncompliance of medications and treatment who is referred by his PCP to the emergency department due to weakness, decreased appetite, weight loss and abdominal distention of several months duration, but particularly significant in the past few weeks.  He denies abdominal pain, nausea, emesis, constipation, melena or hematochezia.  His stools are occasionally soft.  His hearing has been darker and less in volume recently.  No dysuria, frequency or hematuria.  He denies fever, chills, rhinorrhea, sore throat, wheezing or hemoptysis.  He gets occasional dyspnea.  Denies chest pain, palpitations, diaphoresis, PND or orthopnea, but has lower extremity edema and gets occasionally mildly lightheaded.  No polyuria, polydipsia, polyphagia or blurred vision.    PT Comments    Patient progressing well with therapy. Patient more steady with SOC use but required cues for sequencing of steps and placement of hands during transfers with [facility]. Therefore patient will require additional DME instruction on SPC use. Continue with current plan and progress as able. Patient agreeable to sitting in recliner at end of session. Nursing notified.    Follow Up Recommendations  Home health PT;Supervision for mobility/OOB;Supervision - Intermittent     Equipment Recommendations  None recommended by PT    Recommendations for Other Services       Precautions / Restrictions Precautions Precautions: Fall    Mobility  Bed Mobility Overal bed mobility: Modified Independent                Transfers Overall transfer level: Needs  assistance Equipment used: Straight cane Transfers: Sit to/from Stand;Stand Pivot Transfers Sit to Stand: Min assist;Supervision Stand pivot transfers: Supervision;Min guard       General transfer comment: cues for sequencing of steps and placement of hands for [facility] use  Ambulation/Gait Ambulation/Gait assistance: Supervision Gait Distance (Feet): 225 Feet Assistive device: Straight cane Gait Pattern/deviations: Decreased step length - right;Decreased step length - left;Decreased stride length Gait velocity: decreased   General Gait Details: somewhat slow and labored gait, reciprocal 3 point use of SPC; limited by fatigue.   Stairs             Wheelchair Mobility    Modified Rankin (Stroke Patients Only)       Balance Overall balance assessment: Needs assistance Sitting-balance support: Feet supported;No upper extremity supported Sitting balance-Leahy Scale: Good Sitting balance - Comments: seated at EOB   Standing balance support: During functional activity;No upper extremity supported Standing balance-Leahy Scale: Poor Standing balance comment: fair using SPC      Cognition Arousal/Alertness: Awake/alert Behavior During Therapy: WFL for tasks assessed/performed Overall Cognitive Status: Within Functional Limits for tasks assessed      Exercises      General Comments        Pertinent Vitals/Pain Pain Assessment: No/denies pain    Home Living         Prior Function            PT Goals (current goals can now be found in the care plan section) Acute Rehab PT Goals Patient Stated Goal: return home with family to assist PT Goal Formulation: With patient Time For Goal Achievement: 01/30/20 Potential to Achieve Goals: Good Progress towards PT goals: Progressing  toward goals    Frequency    Min 3X/week      PT Plan Current plan remains appropriate       AM-PAC PT "6 Clicks" Mobility   Outcome Measure  Help needed turning from your  back to your side while in a flat bed without using bedrails?: None Help needed moving from lying on your back to sitting on the side of a flat bed without using bedrails?: None Help needed moving to and from a bed to a chair (including a wheelchair)?: A Little Help needed standing up from a chair using your arms (e.g., wheelchair or bedside chair)?: A Little Help needed to walk in hospital room?: A Little Help needed climbing 3-5 steps with a railing? : A Little 6 Click Score: 20    End of Session   Activity Tolerance: Patient tolerated treatment well;Patient limited by fatigue Patient left: in chair;with call bell/phone within reach Nurse Communication: Mobility status PT Visit Diagnosis: Other abnormalities of gait and mobility (R26.89);Unsteadiness on feet (R26.81);Muscle weakness (generalized) (M62.81)     Time: [id number] PT Time Calculation (min) (ACUTE ONLY): 23 min  Charges:  $Gait Training: 8-22 mins $Therapeutic Activity: 8-22 mins                     [name]. Hartnett-Rands, MS, PT [place] #[id number] 01/26/2020, 2:03 PM

## 2020-01-26 NOTE — TOC Transition Note (Signed)
Transition of Care [facility]) - CM/SW Discharge Note   Patient Details  Name: Darren Howard MRN: [id number] Date of Birth: [birth year]  [facility] of [facility] CM/SW Contact:  Natasha Bence, LCSW Phone Number: 01/26/2020, 4:24 PM   Clinical Narrative:    CSW observed patient's D/C order. CSW notified Vaughan Basta with [facility] of patient's discharge. Vaughan Basta agreeable to provide services upon d/c. TOC signing off.   Final next level of care: [place] Barriers to Discharge: Barriers Resolved   Patient Goals and CMS Choice Patient states their goals for this hospitalization and ongoing recovery are:: go home CMS Medicare.gov Compare Post Acute Care list provided to:: Patient Choice offered to / list presented to : Patient  Discharge Placement                    Patient and family notified of of transfer: 01/26/20  Discharge Plan and Services In-house Referral: Clinical Social Work Discharge Planning Services: Follow-up appt scheduled Post Acute Care Choice: Home Health                    HH Arranged: RN,PT [place] Agency: [place] ([facility]) Date [place]: 01/26/20 Time [place]: 1624 Representative spoke with at [place]: [place] Determinants of Health ([place]) Interventions     Readmission Risk Interventions Readmission Risk Prevention Plan 01/23/2020 05/18/2018 05/14/2018  Post Dischage Appt - Complete -  Medication Screening - Complete -  Transportation Screening Complete Complete Complete  PCP or Specialist Appt within 5-7 Days - - Complete  PCP or Specialist Appt within 3-5 Days Complete - -  Home Care Screening - - Complete  Medication Review (RN CM) - - Complete  HRI or Home Care Consult Complete - -  Social Work Consult for [place] Planning/Counseling Complete - -  Palliative Care Screening Not Applicable - -  Medication Review (RN Care Manager) Complete - -  Some recent data might be hidden

## 2020-01-26 NOTE — Telephone Encounter (Signed)
Letter and labs mailed to pt.

## 2020-01-26 NOTE — Discharge Summary (Signed)
Physician Discharge Summary  Darren Howard ZHG:[id number] DOB: [birth year] DOA: 01/18/2020  PCP: [name], MD  Admit date: 01/18/2020 Discharge date: 01/26/2020  Admitted From: Home Disposition: Home  Recommendations for Outpatient Follow-up:  1. Follow up with PCP in 1-2 weeks 2. Please obtain BMP/CBC in one week 3. Follow-up with GI as an outpatient 4. May need repeat paracentesis moving forward  Home Health: Home health PT, RN Equipment/Devices:  Discharge Condition: Stable CODE STATUS: Full code Diet recommendation: Heart healthy, carb modified  Brief/Interim Summary: 69 year old male with a history of alcohol use, diabetes, cirrhosis, admitted to the hospital with acute kidney injury in the setting of diarrhea as well as rectal bleeding  Discharge Diagnoses:  Principal Problem:   AKI (acute kidney injury) ([place]) Active Problems:   Hypertension   Normocytic anemia   DM2 (diabetes mellitus, type 2) (HCC)   Hypomagnesemia   Alcohol abuse   Tobacco use   HLD (hyperlipidemia)   Hepatorenal syndrome (HCC)   Prolonged QT interval   Abdominal ascites   Generalized weakness   Hepatitis   Alcoholic cirrhosis of liver with ascites (HCC)   Bleeding hemorrhoids  Acute kidney injury.  Related to volume loss from diarrhea.  May have also had a component of hepatorenal syndrome.  Patient was treated with IV fluids, IV albumin.  Overall renal function has improved from 2.0-1.4.  Persistent diarrhea.  Patient had colonoscopy on 12/4.  He was found to have large hemorrhoid.  Overall bowel movements have improved  Alcoholic liver cirrhosis with ascites.  He completed IV Rocephin for SBP prophylaxis.  He underwent paracentesis on 12/3 with removal of 4 L of fluid and again on 12/7 with movable 4.7 L of fluid he is been started on oral diuretics.  INR was noted to be elevated at 2.1 and he received vitamin K.  Meld score is 23.  He will need follow-up with GI as an  outpatient  Acute blood loss anemia.  Secondary to GI bleeding likely from hemorrhoid.  Seen by general surgery and not felt to be an appropriate candidate for hemorrhoidectomy at this time.  Patient was transfused 1 unit of PRBC with improvement of hemoglobin.  Follow-up hemoglobin appears to have stabilized.  Clinically, he does not appear to have any ongoing bleeding for over 24 to 48 hours.  Thrombocytopenia.  Felt to be related to liver disease  Discharge Instructions  Discharge Instructions    Diet - low sodium heart healthy   Complete by: As directed    Increase activity slowly   Complete by: As directed      Allergies as of 01/26/2020   No Known Allergies     Medication List    STOP taking these medications   amLODipine 5 MG tablet Commonly known as: NORVASC   ammonium lactate 12 % cream Commonly known as: AMLACTIN   atorvastatin 10 MG tablet Commonly known as: LIPITOR   calcium carbonate 500 MG chewable tablet Commonly known as: TUMS - dosed in mg elemental calcium   gabapentin 300 MG capsule Commonly known as: NEURONTIN   hydrocortisone 2.5 % rectal cream Commonly known as: ANUSOL-HC   magnesium oxide 400 (241.3 Mg) MG tablet Commonly known as: MAG-OX   metFORMIN 500 MG 24 hr tablet Commonly known as: GLUCOPHAGE-XR   multivitamin with minerals Tabs tablet   potassium chloride SA 20 MEQ tablet Commonly known as: KLOR-CON   tamsulosin 0.4 MG Caps capsule Commonly known as: FLOMAX     TAKE these  medications   carvedilol 3.125 MG tablet Commonly known as: COREG Take 1 tablet (3.125 mg total) by mouth 2 (two) times daily with a meal. What changed:   medication strength  how much to take  when to take this   furosemide 20 MG tablet Commonly known as: LASIX Take 1 tablet (20 mg total) by mouth daily. Start taking on: January 27, 2020   hydrocortisone 25 MG suppository Commonly known as: ANUSOL-HC Place 1 suppository (25 mg total) rectally 2  (two) times daily.   lactulose 10 GM/15ML solution Commonly known as: CHRONULAC Take 45 mLs (30 g total) by mouth daily. Start taking on: January 27, 2020   midodrine 2.5 MG tablet Commonly known as: PROAMATINE Take 3 tablets (7.5 mg total) by mouth 3 (three) times daily with meals.   pantoprazole 40 MG tablet Commonly known as: PROTONIX Take 1 tablet (40 mg total) by mouth daily for 30 days.   saccharomyces boulardii 250 MG capsule Commonly known as: FLORASTOR Take 1 capsule (250 mg total) by mouth 2 (two) times daily.   spironolactone 50 MG tablet Commonly known as: ALDACTONE Take 1 tablet (50 mg total) by mouth daily. Start taking on: January 27, 2020       Follow-up Information    [name], MD. Go on 01/30/2020.   Specialty: Family Medicine Why: Please arrive by 1:00PM for a hospital follow up appointment. Contact information: [address] [place] [address] [facility].   Why: PT and RN Contact information: [address] [place] [address] [phone]             No Known Allergies  Consultations:  Gastroenterology  General surgery   Procedures/Studies: CT Chest W Contrast  Result Date: 01/18/2020 CLINICAL DATA:  Weakness, weight loss, abdominal distension EXAM: CT CHEST, ABDOMEN, AND PELVIS WITH CONTRAST TECHNIQUE: Multidetector CT imaging of the chest, abdomen and pelvis was performed following the standard protocol during bolus administration of intravenous contrast. CONTRAST:  81mL OMNIPAQUE IOHEXOL 300 MG/ML  SOLN COMPARISON:  06/13/2019 FINDINGS: CT CHEST FINDINGS Cardiovascular: The heart is unremarkable without pericardial effusion. Mild atherosclerosis of the aorta without thoracic aortic aneurysm or dissection. Mediastinum/Nodes: No enlarged mediastinal, hilar, or axillary lymph nodes. Thyroid gland, trachea, and esophagus demonstrate no significant findings.  Lungs/Pleura: Trace left pleural effusion. No acute airspace disease or pneumothorax. The central airways are patent. Musculoskeletal: No acute or destructive bony lesions. Reconstructed images demonstrate no additional findings. CT ABDOMEN PELVIS FINDINGS Hepatobiliary: Diffuse heterogeneity of the liver is noted. Overall, increased enhancement since prior study where diffuse hepatic steatosis was noted. Findings could reflect underlying hepatitis, please correlate with liver function tests. Gallbladder is unremarkable. Pancreas: Diffuse pancreatic atrophy unchanged. Spleen: Normal in size without focal abnormality. Adrenals/Urinary Tract: Multiple right renal cortical cysts unchanged. Otherwise the kidneys enhance normally. No urinary tract calculi or obstruction. Bladder is decompressed, which limits its evaluation. The adrenals are normal. Stomach/Bowel: No bowel obstruction or ileus. Mild wall thickening of the centralized small bowel loops is likely due to underlying liver disease and diffuse ascites. Vascular/Lymphatic: Splenic vein, SMV, and portal vein are patent. Small esophageal and gastric varices are seen within the upper abdomen, as well as recanalization of the umbilical vein. Findings are consistent with portal venous hypertension. Minimal atherosclerosis of the distal aorta. No pathologic adenopathy. Reproductive: Prostate is unremarkable. Other: Large volume ascites is noted. Ascites extends into a right inguinal hernia.  No bowel herniation. No free intraperitoneal gas. Musculoskeletal: No acute or destructive bony lesions. Reconstructed images demonstrate no additional findings. IMPRESSION: 1. Diffuse heterogeneity of the liver parenchyma consistent with a combination of hepatitis and underlying hepatic steatosis. 2. Large volume ascites. 3. Trace left pleural effusion. 4. Small gastric and esophageal varices. 5. Right inguinal hernia containing peritoneal fat and ascites. No bowel herniation.  Electronically Signed   By: Randa Ngo M.D.   On: 01/18/2020 19:51   CT ABDOMEN PELVIS W CONTRAST  Result Date: 01/18/2020 CLINICAL DATA:  Weakness, weight loss, abdominal distension EXAM: CT CHEST, ABDOMEN, AND PELVIS WITH CONTRAST TECHNIQUE: Multidetector CT imaging of the chest, abdomen and pelvis was performed following the standard protocol during bolus administration of intravenous contrast. CONTRAST:  21mL OMNIPAQUE IOHEXOL 300 MG/ML  SOLN COMPARISON:  06/13/2019 FINDINGS: CT CHEST FINDINGS Cardiovascular: The heart is unremarkable without pericardial effusion. Mild atherosclerosis of the aorta without thoracic aortic aneurysm or dissection. Mediastinum/Nodes: No enlarged mediastinal, hilar, or axillary lymph nodes. Thyroid gland, trachea, and esophagus demonstrate no significant findings. Lungs/Pleura: Trace left pleural effusion. No acute airspace disease or pneumothorax. The central airways are patent. Musculoskeletal: No acute or destructive bony lesions. Reconstructed images demonstrate no additional findings. CT ABDOMEN PELVIS FINDINGS Hepatobiliary: Diffuse heterogeneity of the liver is noted. Overall, increased enhancement since prior study where diffuse hepatic steatosis was noted. Findings could reflect underlying hepatitis, please correlate with liver function tests. Gallbladder is unremarkable. Pancreas: Diffuse pancreatic atrophy unchanged. Spleen: Normal in size without focal abnormality. Adrenals/Urinary Tract: Multiple right renal cortical cysts unchanged. Otherwise the kidneys enhance normally. No urinary tract calculi or obstruction. Bladder is decompressed, which limits its evaluation. The adrenals are normal. Stomach/Bowel: No bowel obstruction or ileus. Mild wall thickening of the centralized small bowel loops is likely due to underlying liver disease and diffuse ascites. Vascular/Lymphatic: Splenic vein, SMV, and portal vein are patent. Small esophageal and gastric varices are  seen within the upper abdomen, as well as recanalization of the umbilical vein. Findings are consistent with portal venous hypertension. Minimal atherosclerosis of the distal aorta. No pathologic adenopathy. Reproductive: Prostate is unremarkable. Other: Large volume ascites is noted. Ascites extends into a right inguinal hernia. No bowel herniation. No free intraperitoneal gas. Musculoskeletal: No acute or destructive bony lesions. Reconstructed images demonstrate no additional findings. IMPRESSION: 1. Diffuse heterogeneity of the liver parenchyma consistent with a combination of hepatitis and underlying hepatic steatosis. 2. Large volume ascites. 3. Trace left pleural effusion. 4. Small gastric and esophageal varices. 5. Right inguinal hernia containing peritoneal fat and ascites. No bowel herniation. Electronically Signed   By: Randa Ngo M.D.   On: 01/18/2020 19:51   US Paracentesis  Result Date: 01/24/2020 INDICATION: Hepatic steatosis, alcohol abuse, ascites EXAM: ULTRASOUND GUIDED DIAGNOSTIC AND THERAPEUTIC PARACENTESIS MEDICATIONS: None COMPLICATIONS: None immediate PROCEDURE: Informed written consent was obtained from the patient after a discussion of the risks, benefits and alternatives to treatment. A timeout was performed prior to the initiation of the procedure. Initial ultrasound scanning demonstrates a large amount of ascites within the right lower abdominal quadrant. The right lower abdomen was prepped and draped in the usual sterile fashion. 1% lidocaine was used for local anesthesia. Following this, a 5 [place] Yueh catheter was introduced. An ultrasound image was saved for documentation purposes. The paracentesis was performed. The catheter was removed and a dressing was applied. The patient tolerated the procedure well without immediate post procedural complication. Patient received post-procedure intravenous albumin; see nursing notes for details.  FINDINGS: A total of approximately 4.7 L  of yellow ascitic fluid was removed. Samples were sent to the laboratory as requested by the clinical team. IMPRESSION: Successful ultrasound-guided paracentesis yielding 4.7 liters of peritoneal fluid. Electronically Signed   By: Lavonia Dana M.D.   On: 01/24/2020 15:44   US Paracentesis  Result Date: 01/20/2020 INDICATION: Alcohol abuse, ascites EXAM: ULTRASOUND GUIDED THERAPEUTIC PARACENTESIS MEDICATIONS: None COMPLICATIONS: None immediate PROCEDURE: Informed written consent was obtained from the patient after a discussion of the risks, benefits and alternatives to treatment. A timeout was performed prior to the initiation of the procedure. Initial ultrasound scanning demonstrates a large amount of ascites within the right lower abdominal quadrant. The right lower abdomen was prepped and draped in the usual sterile fashion. 1% lidocaine was used for local anesthesia. Following this, a 5 [place] Yueh catheter was introduced. An ultrasound image was saved for documentation purposes. The paracentesis was performed. The catheter was removed and a dressing was applied. The patient tolerated the procedure well without immediate post procedural complication. FINDINGS: A total of approximately 4 L of yellow ascitic fluid was removed. Samples were sent to the laboratory as requested by the clinical team. IMPRESSION: Successful ultrasound-guided paracentesis yielding 4 liters of peritoneal fluid. Electronically Signed   By: Lavonia Dana M.D.   On: 01/20/2020 13:13   DG Chest Port 1 View  Result Date: 01/18/2020 CLINICAL DATA:  69 year old male with tachycardia. EXAM: PORTABLE CHEST 1 VIEW COMPARISON:  Chest radiograph dated 06/04/2019. FINDINGS: Shallow inspiration. No focal consolidation, pleural effusion, pneumothorax. The cardiac silhouette is within limits. No acute osseous pathology. IMPRESSION: No active disease. Electronically Signed   By: Anner Crete M.D.   On: 01/18/2020 17:25        Subjective: Patient denies any blood in stools.  No abdominal pain.  Discharge Exam: Vitals:   01/25/20 1408 01/25/20 2110 01/26/20 0649 01/26/20 1318  BP: 109/65 130/68 115/64 127/84  Pulse: 70 72 79 86  Resp: 19 20 20 20   Temp: 98.2 F (36.8 C) 98.7 F (37.1 C) 99.4 F (37.4 C) 98.1 F (36.7 C)  TempSrc:  Oral Oral Oral  SpO2: 100% 100% 100% 100%  Weight:      Height:        General: Pt is alert, awake, not in acute distress Cardiovascular: RRR, S1/S2 +, no rubs, no gallops Respiratory: CTA bilaterally, no wheezing, no rhonchi Abdominal: Soft, NT, distended, bowel sounds + Extremities: no edema, no cyanosis    The results of significant diagnostics from this hospitalization (including imaging, microbiology, ancillary and laboratory) are listed below for reference.     Microbiology: Recent Results (from the past 240 hour(s))  Resp Panel by RT-PCR (Flu A&B, Covid) Nasopharyngeal Swab     Status: None   Collection Time: 01/18/20  8:27 PM   Specimen: Nasopharyngeal Swab; Nasopharyngeal(NP) swabs in vial transport medium  Result Value Ref Range Status   SARS Coronavirus 2 by RT PCR NEGATIVE NEGATIVE Final    Comment: (NOTE) SARS-CoV-2 target nucleic acids are NOT DETECTED.  The SARS-CoV-2 RNA is generally detectable in upper respiratory specimens during the acute phase of infection. The lowest concentration of SARS-CoV-2 viral copies this assay can detect is 138 copies/mL. A negative result does not preclude SARS-Cov-2 infection and should not be used as the sole basis for treatment or other patient management decisions. A negative result may occur with  improper specimen collection/handling, submission of specimen other than nasopharyngeal swab, presence of viral mutation(s) within  the areas targeted by this assay, and inadequate number of viral copies(<138 copies/mL). A negative result must be combined with clinical observations, patient history, and  epidemiological information. The expected result is Negative.  Fact Sheet for Patients:  EntrepreneurPulse.com.au  Fact Sheet for Healthcare Providers:  IncredibleEmployment.be  This test is no t yet approved or cleared by the [place] FDA and  has been authorized for detection and/or diagnosis of SARS-CoV-2 by FDA under an Emergency Use Authorization (EUA). This EUA will remain  in effect (meaning this test can be used) for the duration of the COVID-19 declaration under Section 564(b)(1) of the Act, 21 U.S.C.section 360bbb-3(b)(1), unless the authorization is terminated  or revoked sooner.       Influenza A by PCR NEGATIVE NEGATIVE Final   Influenza B by PCR NEGATIVE NEGATIVE Final    Comment: (NOTE) The Xpert Xpress SARS-CoV-2/FLU/RSV plus assay is intended as an aid in the diagnosis of influenza from Nasopharyngeal swab specimens and should not be used as a sole basis for treatment. Nasal washings and aspirates are unacceptable for Xpert Xpress SARS-CoV-2/FLU/RSV testing.  Fact Sheet for Patients: EntrepreneurPulse.com.au  Fact Sheet for Healthcare Providers: IncredibleEmployment.be  This test is not yet approved or cleared by the [place] FDA and has been authorized for detection and/or diagnosis of SARS-CoV-2 by FDA under an Emergency Use Authorization (EUA). This EUA will remain in effect (meaning this test can be used) for the duration of the COVID-19 declaration under Section 564(b)(1) of the Act, 21 U.S.C. section 360bbb-3(b)(1), unless the authorization is terminated or revoked.  Performed at [facility], [address], [place], [place] [address]   Gram stain     Status: None   Collection Time: 01/24/20  1:55 PM   Specimen: Peritoneal Washings; Body Fluid  Result Value Ref Range Status   Specimen Description PERITONEAL  Final   Special Requests NONE  Final   Gram Stain   Final     NO ORGANISMS SEEN WBC PRESENT, PREDOMINANTLY MONONUCLEAR CYTOSPIN SMEAR Performed at [facility], [address], [place], [place] [address]    Report Status 01/24/2020 FINAL  Final  Culture, body fluid-bottle     Status: None (Preliminary result)   Collection Time: 01/24/20  1:55 PM   Specimen: Peritoneal Washings  Result Value Ref Range Status   Specimen Description PERITONEAL  Final   Special Requests BOTTLES DRAWN AEROBIC AND ANAEROBIC 10CC  Final   Culture   Final    NO GROWTH 2 DAYS Performed at [facility], [address] [place], [place] [address]    Report Status PENDING  Incomplete     Labs: BNP (last 3 results) Recent Labs    06/04/19 2005 06/05/19 0421  BNP 292.0* 875.6*   Basic Metabolic Panel: Recent Labs  Lab 01/20/20 0439 01/21/20 0534 01/23/20 0959 01/24/20 0654 01/25/20 0531 01/26/20 0521  NA 137 139 140 135 135 136  K 3.7 3.6 3.4* 3.8 3.6 3.5  CL 111 111 113* 109 109 109  CO2 16* 19* 20* 19* 19* 19*  GLUCOSE 172* 152* 159* 117* 128* 155*  BUN 21 17 18 18 17 15   CREATININE 1.60* 1.66* 1.65* 1.66* 1.44* 1.46*  CALCIUM 8.4* 8.5* 8.1* 7.6* 7.8* 8.0*  MG 2.1  --   --   --   --   --   PHOS 2.9  --   --   --   --   --    Liver Function Tests: Recent Labs  Lab 01/21/20 0534 01/23/20 0959 01/24/20 0654 01/25/20 0531 01/26/20 0521  AST 31 34 29 28 33  ALT 15 16 12 13 14   ALKPHOS 61 71 62 60 51  BILITOT 3.5* 3.2* 2.8* 2.0* 2.3*  PROT 5.9* 6.2* 5.3* 5.1* 5.1*  ALBUMIN 3.4* 3.3* 2.8* 2.9* 3.0*   No results for input(s): LIPASE, AMYLASE in the last 168 hours. No results for input(s): AMMONIA in the last 168 hours. CBC: Recent Labs  Lab 01/23/20 0959 01/24/20 0654 01/25/20 0531 01/26/20 0521 01/26/20 1425  WBC 7.4 7.0 4.7 6.4 8.3  HGB 11.2* 10.2* 9.0* 8.4* 9.4*  HCT 33.6* 29.8* 26.1* 24.5* 28.4*  MCV 96.6 96.1 93.9 94.6 96.9  PLT 151 117* 88* 79* 100*   Cardiac Enzymes: No results for input(s): CKTOTAL, CKMB, CKMBINDEX, TROPONINI  in the last 168 hours. BNP: Invalid input(s): POCBNP CBG: Recent Labs  Lab 01/25/20 1652 01/25/20 2345 01/26/20 0750 01/26/20 1123 01/26/20 1630  GLUCAP 141* 143* 112* 129* 107*   D-Dimer No results for input(s): DDIMER in the last 72 hours. Hgb A1c No results for input(s): HGBA1C in the last 72 hours. Lipid Profile No results for input(s): CHOL, HDL, LDLCALC, TRIG, CHOLHDL, LDLDIRECT in the last 72 hours. Thyroid function studies No results for input(s): TSH, T4TOTAL, T3FREE, THYROIDAB in the last 72 hours.  Invalid input(s): FREET3 Anemia work up No results for input(s): VITAMINB12, FOLATE, FERRITIN, TIBC, IRON, RETICCTPCT in the last 72 hours. Urinalysis    Component Value Date/Time   COLORURINE AMBER (A) 01/18/2020 2309   APPEARANCEUR HAZY (A) 01/18/2020 2309   LABSPEC 1.038 (H) 01/18/2020 2309   PHURINE 5.0 01/18/2020 2309   GLUCOSEU NEGATIVE 01/18/2020 2309   HGBUR MODERATE (A) 01/18/2020 2309   BILIRUBINUR NEGATIVE 01/18/2020 2309   KETONESUR NEGATIVE 01/18/2020 2309   PROTEINUR 30 (A) 01/18/2020 2309   UROBILINOGEN 0.2 06/14/2008 1930   NITRITE NEGATIVE 01/18/2020 2309   LEUKOCYTESUR MODERATE (A) 01/18/2020 2309   Sepsis Labs Invalid input(s): PROCALCITONIN,  WBC,  LACTICIDVEN Microbiology Recent Results (from the past 240 hour(s))  Resp Panel by RT-PCR (Flu A&B, Covid) Nasopharyngeal Swab     Status: None   Collection Time: 01/18/20  8:27 PM   Specimen: Nasopharyngeal Swab; Nasopharyngeal(NP) swabs in vial transport medium  Result Value Ref Range Status   SARS Coronavirus 2 by RT PCR NEGATIVE NEGATIVE Final    Comment: (NOTE) SARS-CoV-2 target nucleic acids are NOT DETECTED.  The SARS-CoV-2 RNA is generally detectable in upper respiratory specimens during the acute phase of infection. The lowest concentration of SARS-CoV-2 viral copies this assay can detect is 138 copies/mL. A negative result does not preclude SARS-Cov-2 infection and should not be  used as the sole basis for treatment or other patient management decisions. A negative result may occur with  improper specimen collection/handling, submission of specimen other than nasopharyngeal swab, presence of viral mutation(s) within the areas targeted by this assay, and inadequate number of viral copies(<138 copies/mL). A negative result must be combined with clinical observations, patient history, and epidemiological information. The expected result is Negative.  Fact Sheet for Patients:  EntrepreneurPulse.com.au  Fact Sheet for Healthcare Providers:  IncredibleEmployment.be  This test is no t yet approved or cleared by the [place] FDA and  has been authorized for detection and/or diagnosis of SARS-CoV-2 by FDA under an Emergency Use Authorization (EUA). This EUA will remain  in effect (meaning this test can be used) for the duration of the COVID-19 declaration under Section 564(b)(1)  of the Act, 21 U.S.C.section 360bbb-3(b)(1), unless the authorization is terminated  or revoked sooner.       Influenza A by PCR NEGATIVE NEGATIVE Final   Influenza B by PCR NEGATIVE NEGATIVE Final    Comment: (NOTE) The Xpert Xpress SARS-CoV-2/FLU/RSV plus assay is intended as an aid in the diagnosis of influenza from Nasopharyngeal swab specimens and should not be used as a sole basis for treatment. Nasal washings and aspirates are unacceptable for Xpert Xpress SARS-CoV-2/FLU/RSV testing.  Fact Sheet for Patients: EntrepreneurPulse.com.au  Fact Sheet for Healthcare Providers: IncredibleEmployment.be  This test is not yet approved or cleared by the [place] FDA and has been authorized for detection and/or diagnosis of SARS-CoV-2 by FDA under an Emergency Use Authorization (EUA). This EUA will remain in effect (meaning this test can be used) for the duration of the COVID-19 declaration under Section  564(b)(1) of the Act, 21 U.S.C. section 360bbb-3(b)(1), unless the authorization is terminated or revoked.  Performed at [facility], [address], [place], [place] [address]   Gram stain     Status: None   Collection Time: 01/24/20  1:55 PM   Specimen: Peritoneal Washings; Body Fluid  Result Value Ref Range Status   Specimen Description PERITONEAL  Final   Special Requests NONE  Final   Gram Stain   Final    NO ORGANISMS SEEN WBC PRESENT, PREDOMINANTLY MONONUCLEAR CYTOSPIN SMEAR Performed at [facility], [address] [place], [place] [address]    Report Status 01/24/2020 FINAL  Final  Culture, body fluid-bottle     Status: None (Preliminary result)   Collection Time: 01/24/20  1:55 PM   Specimen: Peritoneal Washings  Result Value Ref Range Status   Specimen Description PERITONEAL  Final   Special Requests BOTTLES DRAWN AEROBIC AND ANAEROBIC 10CC  Final   Culture   Final    NO GROWTH 2 DAYS Performed at [facility], [address], [place], [place] [address]    Report Status PENDING  Incomplete     Time coordinating discharge: 38mins  SIGNED:   Kathie Dike, MD  [facility] 01/26/2020, 9:05 PM   If 7PM-7AM, please contact night-coverage [email or web address]

## 2020-01-26 NOTE — Progress Notes (Signed)
Pt was informed of follow up appt at [facility] on Jan 21 at 0800

## 2020-01-28 DIAGNOSIS — E785 Hyperlipidemia, unspecified: Secondary | ICD-10-CM | POA: Diagnosis not present

## 2020-01-28 DIAGNOSIS — K319 Disease of stomach and duodenum, unspecified: Secondary | ICD-10-CM | POA: Diagnosis not present

## 2020-01-28 DIAGNOSIS — E119 Type 2 diabetes mellitus without complications: Secondary | ICD-10-CM | POA: Diagnosis not present

## 2020-01-28 DIAGNOSIS — K767 Hepatorenal syndrome: Secondary | ICD-10-CM | POA: Diagnosis not present

## 2020-01-28 DIAGNOSIS — K269 Duodenal ulcer, unspecified as acute or chronic, without hemorrhage or perforation: Secondary | ICD-10-CM | POA: Diagnosis not present

## 2020-01-28 DIAGNOSIS — K649 Unspecified hemorrhoids: Secondary | ICD-10-CM | POA: Diagnosis not present

## 2020-01-28 DIAGNOSIS — D649 Anemia, unspecified: Secondary | ICD-10-CM | POA: Diagnosis not present

## 2020-01-28 DIAGNOSIS — F1010 Alcohol abuse, uncomplicated: Secondary | ICD-10-CM | POA: Diagnosis not present

## 2020-01-28 DIAGNOSIS — K7031 Alcoholic cirrhosis of liver with ascites: Secondary | ICD-10-CM | POA: Diagnosis not present

## 2020-01-28 DIAGNOSIS — I10 Essential (primary) hypertension: Secondary | ICD-10-CM | POA: Diagnosis not present

## 2020-01-28 DIAGNOSIS — K759 Inflammatory liver disease, unspecified: Secondary | ICD-10-CM | POA: Diagnosis not present

## 2020-01-28 DIAGNOSIS — N179 Acute kidney failure, unspecified: Secondary | ICD-10-CM | POA: Diagnosis not present

## 2020-01-29 LAB — CULTURE, BODY FLUID W GRAM STAIN -BOTTLE: Culture: NO GROWTH

## 2020-01-30 DIAGNOSIS — G629 Polyneuropathy, unspecified: Secondary | ICD-10-CM | POA: Diagnosis not present

## 2020-01-30 DIAGNOSIS — F1020 Alcohol dependence, uncomplicated: Secondary | ICD-10-CM | POA: Diagnosis not present

## 2020-01-30 DIAGNOSIS — R188 Other ascites: Secondary | ICD-10-CM | POA: Diagnosis not present

## 2020-01-30 DIAGNOSIS — Z6822 Body mass index (BMI) 22.0-22.9, adult: Secondary | ICD-10-CM | POA: Diagnosis not present

## 2020-01-30 DIAGNOSIS — N179 Acute kidney failure, unspecified: Secondary | ICD-10-CM | POA: Diagnosis not present

## 2020-01-30 DIAGNOSIS — F17200 Nicotine dependence, unspecified, uncomplicated: Secondary | ICD-10-CM | POA: Diagnosis not present

## 2020-01-30 DIAGNOSIS — K922 Gastrointestinal hemorrhage, unspecified: Secondary | ICD-10-CM | POA: Diagnosis not present

## 2020-02-18 DIAGNOSIS — E785 Hyperlipidemia, unspecified: Secondary | ICD-10-CM | POA: Diagnosis not present

## 2020-02-18 DIAGNOSIS — K7031 Alcoholic cirrhosis of liver with ascites: Secondary | ICD-10-CM | POA: Diagnosis not present

## 2020-02-18 DIAGNOSIS — K269 Duodenal ulcer, unspecified as acute or chronic, without hemorrhage or perforation: Secondary | ICD-10-CM | POA: Diagnosis not present

## 2020-02-18 DIAGNOSIS — E119 Type 2 diabetes mellitus without complications: Secondary | ICD-10-CM | POA: Diagnosis not present

## 2020-02-18 DIAGNOSIS — K767 Hepatorenal syndrome: Secondary | ICD-10-CM | POA: Diagnosis not present

## 2020-02-18 DIAGNOSIS — F1010 Alcohol abuse, uncomplicated: Secondary | ICD-10-CM | POA: Diagnosis not present

## 2020-02-18 DIAGNOSIS — N179 Acute kidney failure, unspecified: Secondary | ICD-10-CM | POA: Diagnosis not present

## 2020-02-18 DIAGNOSIS — I10 Essential (primary) hypertension: Secondary | ICD-10-CM | POA: Diagnosis not present

## 2020-02-18 DIAGNOSIS — K319 Disease of stomach and duodenum, unspecified: Secondary | ICD-10-CM | POA: Diagnosis not present

## 2020-02-18 DIAGNOSIS — D649 Anemia, unspecified: Secondary | ICD-10-CM | POA: Diagnosis not present

## 2020-02-18 DIAGNOSIS — K759 Inflammatory liver disease, unspecified: Secondary | ICD-10-CM | POA: Diagnosis not present

## 2020-02-18 DIAGNOSIS — K649 Unspecified hemorrhoids: Secondary | ICD-10-CM | POA: Diagnosis not present

## 2020-02-24 ENCOUNTER — Emergency Department (HOSPITAL_COMMUNITY): Payer: PPO

## 2020-02-24 ENCOUNTER — Other Ambulatory Visit: Payer: Self-pay

## 2020-02-24 ENCOUNTER — Inpatient Hospital Stay (HOSPITAL_COMMUNITY)
Admission: EM | Admit: 2020-02-24 | Discharge: 2020-03-01 | DRG: 871 | Disposition: A | Discharge status: Skilled Nursing Facility | Payer: PPO | Attending: Family Medicine | Admitting: Family Medicine

## 2020-02-24 DIAGNOSIS — N179 Acute kidney failure, unspecified: Secondary | ICD-10-CM | POA: Diagnosis present

## 2020-02-24 DIAGNOSIS — K767 Hepatorenal syndrome: Secondary | ICD-10-CM | POA: Diagnosis present

## 2020-02-24 DIAGNOSIS — F1010 Alcohol abuse, uncomplicated: Secondary | ICD-10-CM | POA: Diagnosis present

## 2020-02-24 DIAGNOSIS — E876 Hypokalemia: Secondary | ICD-10-CM | POA: Diagnosis not present

## 2020-02-24 DIAGNOSIS — E872 Acidosis: Secondary | ICD-10-CM | POA: Diagnosis present

## 2020-02-24 DIAGNOSIS — I129 Hypertensive chronic kidney disease with stage 1 through stage 4 chronic kidney disease, or unspecified chronic kidney disease: Secondary | ICD-10-CM | POA: Diagnosis present

## 2020-02-24 DIAGNOSIS — K7290 Hepatic failure, unspecified without coma: Secondary | ICD-10-CM | POA: Diagnosis present

## 2020-02-24 DIAGNOSIS — E1122 Type 2 diabetes mellitus with diabetic chronic kidney disease: Secondary | ICD-10-CM | POA: Diagnosis present

## 2020-02-24 DIAGNOSIS — Z79899 Other long term (current) drug therapy: Secondary | ICD-10-CM | POA: Diagnosis not present

## 2020-02-24 DIAGNOSIS — Z8249 Family history of ischemic heart disease and other diseases of the circulatory system: Secondary | ICD-10-CM | POA: Diagnosis not present

## 2020-02-24 DIAGNOSIS — E785 Hyperlipidemia, unspecified: Secondary | ICD-10-CM | POA: Diagnosis present

## 2020-02-24 DIAGNOSIS — R2681 Unsteadiness on feet: Secondary | ICD-10-CM | POA: Diagnosis not present

## 2020-02-24 DIAGNOSIS — R4182 Altered mental status, unspecified: Secondary | ICD-10-CM

## 2020-02-24 DIAGNOSIS — D631 Anemia in chronic kidney disease: Secondary | ICD-10-CM | POA: Diagnosis present

## 2020-02-24 DIAGNOSIS — Z20822 Contact with and (suspected) exposure to covid-19: Secondary | ICD-10-CM | POA: Diagnosis present

## 2020-02-24 DIAGNOSIS — I10 Essential (primary) hypertension: Secondary | ICD-10-CM | POA: Diagnosis not present

## 2020-02-24 DIAGNOSIS — Z7189 Other specified counseling: Secondary | ICD-10-CM | POA: Diagnosis not present

## 2020-02-24 DIAGNOSIS — R000 Tachycardia, unspecified: Secondary | ICD-10-CM | POA: Diagnosis not present

## 2020-02-24 DIAGNOSIS — R1112 Projectile vomiting: Secondary | ICD-10-CM | POA: Diagnosis not present

## 2020-02-24 DIAGNOSIS — K759 Inflammatory liver disease, unspecified: Secondary | ICD-10-CM | POA: Diagnosis not present

## 2020-02-24 DIAGNOSIS — G9340 Encephalopathy, unspecified: Secondary | ICD-10-CM | POA: Diagnosis not present

## 2020-02-24 DIAGNOSIS — K7031 Alcoholic cirrhosis of liver with ascites: Secondary | ICD-10-CM | POA: Diagnosis present

## 2020-02-24 DIAGNOSIS — E119 Type 2 diabetes mellitus without complications: Secondary | ICD-10-CM | POA: Diagnosis not present

## 2020-02-24 DIAGNOSIS — I700 Atherosclerosis of aorta: Secondary | ICD-10-CM | POA: Diagnosis not present

## 2020-02-24 DIAGNOSIS — R188 Other ascites: Secondary | ICD-10-CM | POA: Diagnosis not present

## 2020-02-24 DIAGNOSIS — N189 Chronic kidney disease, unspecified: Secondary | ICD-10-CM | POA: Diagnosis not present

## 2020-02-24 DIAGNOSIS — Z720 Tobacco use: Secondary | ICD-10-CM | POA: Diagnosis not present

## 2020-02-24 DIAGNOSIS — N184 Chronic kidney disease, stage 4 (severe): Secondary | ICD-10-CM | POA: Diagnosis present

## 2020-02-24 DIAGNOSIS — F17210 Nicotine dependence, cigarettes, uncomplicated: Secondary | ICD-10-CM | POA: Diagnosis present

## 2020-02-24 DIAGNOSIS — K8689 Other specified diseases of pancreas: Secondary | ICD-10-CM | POA: Diagnosis not present

## 2020-02-24 DIAGNOSIS — Z8711 Personal history of peptic ulcer disease: Secondary | ICD-10-CM | POA: Diagnosis not present

## 2020-02-24 DIAGNOSIS — N281 Cyst of kidney, acquired: Secondary | ICD-10-CM | POA: Diagnosis not present

## 2020-02-24 DIAGNOSIS — R6520 Severe sepsis without septic shock: Secondary | ICD-10-CM | POA: Diagnosis not present

## 2020-02-24 DIAGNOSIS — A419 Sepsis, unspecified organism: Principal | ICD-10-CM | POA: Diagnosis present

## 2020-02-24 DIAGNOSIS — K7460 Unspecified cirrhosis of liver: Secondary | ICD-10-CM | POA: Diagnosis not present

## 2020-02-24 DIAGNOSIS — E7220 Disorder of urea cycle metabolism, unspecified: Secondary | ICD-10-CM | POA: Diagnosis not present

## 2020-02-24 DIAGNOSIS — M6281 Muscle weakness (generalized): Secondary | ICD-10-CM | POA: Diagnosis not present

## 2020-02-24 DIAGNOSIS — Z515 Encounter for palliative care: Secondary | ICD-10-CM | POA: Diagnosis not present

## 2020-02-24 DIAGNOSIS — Z452 Encounter for adjustment and management of vascular access device: Secondary | ICD-10-CM | POA: Diagnosis not present

## 2020-02-24 DIAGNOSIS — K7682 Hepatic encephalopathy: Secondary | ICD-10-CM | POA: Diagnosis present

## 2020-02-24 LAB — CBC WITH DIFFERENTIAL/PLATELET
Abs Immature Granulocytes: 0.05 10*3/uL (ref 0.00–0.07)
Basophils Absolute: 0.1 10*3/uL (ref 0.0–0.1)
Basophils Relative: 0 %
Eosinophils Absolute: 0.1 10*3/uL (ref 0.0–0.5)
Eosinophils Relative: 0 %
HCT: 24 % — ABNORMAL LOW (ref 39.0–52.0)
Hemoglobin: 8 g/dL — ABNORMAL LOW (ref 13.0–17.0)
Immature Granulocytes: 0 %
Lymphocytes Relative: 10 %
Lymphs Abs: 1.5 10*3/uL (ref 0.7–4.0)
MCH: 32.1 pg (ref 26.0–34.0)
MCHC: 33.3 g/dL (ref 30.0–36.0)
MCV: 96.4 fL (ref 80.0–100.0)
Monocytes Absolute: 1.4 10*3/uL — ABNORMAL HIGH (ref 0.1–1.0)
Monocytes Relative: 10 %
Neutro Abs: 11.4 10*3/uL — ABNORMAL HIGH (ref 1.7–7.7)
Neutrophils Relative %: 80 %
Platelets: 235 10*3/uL (ref 150–400)
RBC: 2.49 MIL/uL — ABNORMAL LOW (ref 4.22–5.81)
RDW: 17.4 % — ABNORMAL HIGH (ref 11.5–15.5)
WBC: 14.5 10*3/uL — ABNORMAL HIGH (ref 4.0–10.5)
nRBC: 0 % (ref 0.0–0.2)

## 2020-02-24 LAB — URINALYSIS, ROUTINE W REFLEX MICROSCOPIC
Bilirubin Urine: NEGATIVE
Glucose, UA: NEGATIVE mg/dL
Hgb urine dipstick: NEGATIVE
Ketones, ur: 5 mg/dL — AB
Leukocytes,Ua: NEGATIVE
Nitrite: NEGATIVE
Protein, ur: NEGATIVE mg/dL
Specific Gravity, Urine: 1.013 (ref 1.005–1.030)
pH: 5 (ref 5.0–8.0)

## 2020-02-24 LAB — COMPREHENSIVE METABOLIC PANEL
ALT: 22 U/L (ref 0–44)
AST: 45 U/L — ABNORMAL HIGH (ref 15–41)
Albumin: 2.8 g/dL — ABNORMAL LOW (ref 3.5–5.0)
Alkaline Phosphatase: 74 U/L (ref 38–126)
Anion gap: 13 (ref 5–15)
BUN: 53 mg/dL — ABNORMAL HIGH (ref 8–23)
CO2: 14 mmol/L — ABNORMAL LOW (ref 22–32)
Calcium: 8.2 mg/dL — ABNORMAL LOW (ref 8.9–10.3)
Chloride: 114 mmol/L — ABNORMAL HIGH (ref 98–111)
Creatinine, Ser: 4.22 mg/dL — ABNORMAL HIGH (ref 0.61–1.24)
GFR, Estimated: 15 mL/min — ABNORMAL LOW (ref >60)
Glucose, Bld: 93 mg/dL (ref 70–99)
Potassium: 4.3 mmol/L (ref 3.5–5.1)
Sodium: 141 mmol/L (ref 135–145)
Total Bilirubin: 4.3 mg/dL — ABNORMAL HIGH (ref 0.3–1.2)
Total Protein: 7.1 g/dL (ref 6.5–8.1)

## 2020-02-24 LAB — RAPID URINE DRUG SCREEN, HOSP PERFORMED
Amphetamines: NOT DETECTED
Barbiturates: NOT DETECTED
Benzodiazepines: NOT DETECTED
Cocaine: NOT DETECTED
Opiates: NOT DETECTED
Tetrahydrocannabinol: NOT DETECTED

## 2020-02-24 LAB — LACTIC ACID, PLASMA
Lactic Acid, Venous: 3.1 mmol/L (ref 0.5–1.9)
Lactic Acid, Venous: 2.2 mmol/L (ref 0.5–1.9)

## 2020-02-24 LAB — LIPASE, BLOOD: Lipase: 14 U/L (ref 11–51)

## 2020-02-24 LAB — CK: Total CK: 63 U/L (ref 49–397)

## 2020-02-24 LAB — AMMONIA: Ammonia: 143 umol/L — ABNORMAL HIGH (ref 9–35)

## 2020-02-24 LAB — ETHANOL: Alcohol, Ethyl (B): <10 mg/dL (ref <10)

## 2020-02-24 LAB — TROPONIN I (HIGH SENSITIVITY): Troponin I (High Sensitivity): 21 ng/L — ABNORMAL HIGH (ref <18)

## 2020-02-24 MED ORDER — SODIUM CHLORIDE 0.9 % IV BOLUS (SEPSIS)
1000.0000 mL | Freq: Once | INTRAVENOUS | Status: AC
  Administered 2020-02-24: 1000 mL via INTRAVENOUS

## 2020-02-24 MED ORDER — VANCOMYCIN HCL IN DEXTROSE 1-5 GM/200ML-% IV SOLN
1000.0000 mg | Freq: Once | INTRAVENOUS | Status: DC
  Filled 2020-02-24: qty 200

## 2020-02-24 MED ORDER — SODIUM CHLORIDE 0.9 % IV BOLUS (SEPSIS)
250.0000 mL | Freq: Once | INTRAVENOUS | Status: AC
  Administered 2020-02-24: 250 mL via INTRAVENOUS

## 2020-02-24 MED ORDER — VANCOMYCIN HCL 1250 MG/250ML IV SOLN
1250.0000 mg | Freq: Once | INTRAVENOUS | Status: AC
  Administered 2020-02-24: 1250 mg via INTRAVENOUS
  Filled 2020-02-24: qty 250

## 2020-02-24 MED ORDER — LACTATED RINGERS IV SOLN: INTRAVENOUS | Status: DC

## 2020-02-24 MED ORDER — SODIUM CHLORIDE 0.9 % IV SOLN
2.0000 g | Freq: Once | INTRAVENOUS | Status: AC
  Administered 2020-02-24: 2 g via INTRAVENOUS
  Filled 2020-02-24: qty 2

## 2020-02-24 MED ORDER — SODIUM CHLORIDE 0.9 % IV BOLUS
1000.0000 mL | Freq: Once | INTRAVENOUS | Status: AC
  Administered 2020-02-24: 1000 mL via INTRAVENOUS

## 2020-02-24 MED ORDER — SODIUM CHLORIDE 0.9 % IV SOLN: INTRAVENOUS | Status: DC

## 2020-02-24 MED ORDER — SODIUM CHLORIDE 0.9 % IV SOLN
2.0000 g | INTRAVENOUS | Status: DC
  Administered 2020-02-25: 2 g via INTRAVENOUS
  Filled 2020-02-24: qty 2

## 2020-02-24 MED ORDER — VANCOMYCIN VARIABLE DOSE PER UNSTABLE RENAL FUNCTION (PHARMACIST DOSING): Status: DC

## 2020-02-24 MED ORDER — PIPERACILLIN-TAZOBACTAM 3.375 G IVPB
3.3750 g | Freq: Once | INTRAVENOUS | Status: DC
  Administered 2020-02-24: 3.375 g via INTRAVENOUS
  Filled 2020-02-24: qty 50

## 2020-02-24 NOTE — ED Notes (Signed)
Pt. Has a tennis ball size lump present on the right side of their upper groin.

## 2020-02-24 NOTE — ED Triage Notes (Signed)
Unable to review history, screenings, si, and violence assessment, because pt only answers yes to questions

## 2020-02-24 NOTE — ED Notes (Signed)
Patient spit sputum at this nurse.  Mask reapplied to patient.

## 2020-02-24 NOTE — ED Triage Notes (Signed)
Pt to er, per ems pt was sent to er for altered mental status, states that the when they arrived there was no heat on in the house and pt was in his own excrement,  Cleaned pt, pt has redness and excoriation to his buttocks, gown and warm blankets placed, pt eyes open and responds to verbal stim with a "yes", however, pt doesn't answer questions.

## 2020-02-24 NOTE — ED Notes (Signed)
Date and time results received: 02/24/20 2052   Test: lactic Critical Value: 3.1  Name of Provider Notified: Dr. Rogene Houston Orders Received? Or Actions Taken?: none

## 2020-02-24 NOTE — Progress Notes (Signed)
Pharmacy Antibiotic Note  Darren Howard is a 70 y.o. male admitted on 02/24/2020 with AMS and AKI.  Pharmacy has been consulted for Vancomycin and Cefepime dosing for r/o sepsis.  SCr 4.22 (baseline 1.21 Jan 2020)  Plan: Vanc 1250mg  IV x 1 loading dose. No standing dose ordered due to AKI.  Follow SCr trend. Cefepime 2gm IV [id number] D/C Zosyn (duplicate therapy)  Height: 5\' 10"  (177.8 cm) Weight: 68 kg (150 lb) IBW/kg (Calculated) : 73  Temp (24hrs), Avg:97.1 F (36.2 C), Min:96.8 F (36 C), Max:97.8 F (36.6 C)  Recent Labs  Lab 02/24/20 1940  WBC 14.5*  CREATININE 4.22*  LATICACIDVEN 3.1*    Estimated Creatinine Clearance: 16.3 mL/min (A) (by C-G formula based on SCr of 4.22 mg/dL (H)).    No Known Allergies  Antimicrobials this admission: Vanc 1/7 >> Cefepime 1/7 >>  Dose adjustments this admission:   Microbiology results: pending  Thank you for allowing pharmacy to be a part of this patient's care.  [employer], Pharm.D., BCPS Clinical Pharmacist  02/24/2020 9:44 PM

## 2020-02-24 NOTE — Progress Notes (Addendum)
Notified bedside nurse of need to draw repeat lactic acid at 21:40.

## 2020-02-25 ENCOUNTER — Encounter (HOSPITAL_COMMUNITY): Payer: Self-pay | Admitting: Family Medicine

## 2020-02-25 ENCOUNTER — Other Ambulatory Visit: Payer: Self-pay

## 2020-02-25 DIAGNOSIS — F17210 Nicotine dependence, cigarettes, uncomplicated: Secondary | ICD-10-CM | POA: Diagnosis present

## 2020-02-25 DIAGNOSIS — A419 Sepsis, unspecified organism: Secondary | ICD-10-CM | POA: Diagnosis present

## 2020-02-25 DIAGNOSIS — N184 Chronic kidney disease, stage 4 (severe): Secondary | ICD-10-CM | POA: Diagnosis present

## 2020-02-25 DIAGNOSIS — R1112 Projectile vomiting: Secondary | ICD-10-CM | POA: Diagnosis not present

## 2020-02-25 DIAGNOSIS — E785 Hyperlipidemia, unspecified: Secondary | ICD-10-CM | POA: Diagnosis present

## 2020-02-25 DIAGNOSIS — R4182 Altered mental status, unspecified: Secondary | ICD-10-CM

## 2020-02-25 DIAGNOSIS — K767 Hepatorenal syndrome: Secondary | ICD-10-CM | POA: Diagnosis present

## 2020-02-25 DIAGNOSIS — Z20822 Contact with and (suspected) exposure to covid-19: Secondary | ICD-10-CM | POA: Diagnosis present

## 2020-02-25 DIAGNOSIS — Z8711 Personal history of peptic ulcer disease: Secondary | ICD-10-CM | POA: Diagnosis not present

## 2020-02-25 DIAGNOSIS — Z79899 Other long term (current) drug therapy: Secondary | ICD-10-CM | POA: Diagnosis not present

## 2020-02-25 DIAGNOSIS — K7031 Alcoholic cirrhosis of liver with ascites: Secondary | ICD-10-CM | POA: Diagnosis present

## 2020-02-25 DIAGNOSIS — Z7189 Other specified counseling: Secondary | ICD-10-CM | POA: Diagnosis not present

## 2020-02-25 DIAGNOSIS — N179 Acute kidney failure, unspecified: Secondary | ICD-10-CM

## 2020-02-25 DIAGNOSIS — F1010 Alcohol abuse, uncomplicated: Secondary | ICD-10-CM | POA: Diagnosis present

## 2020-02-25 DIAGNOSIS — E872 Acidosis: Secondary | ICD-10-CM | POA: Diagnosis present

## 2020-02-25 DIAGNOSIS — Z515 Encounter for palliative care: Secondary | ICD-10-CM | POA: Diagnosis not present

## 2020-02-25 DIAGNOSIS — K7290 Hepatic failure, unspecified without coma: Secondary | ICD-10-CM | POA: Diagnosis present

## 2020-02-25 DIAGNOSIS — K7682 Hepatic encephalopathy: Secondary | ICD-10-CM | POA: Diagnosis present

## 2020-02-25 DIAGNOSIS — E876 Hypokalemia: Secondary | ICD-10-CM | POA: Diagnosis not present

## 2020-02-25 DIAGNOSIS — Z8249 Family history of ischemic heart disease and other diseases of the circulatory system: Secondary | ICD-10-CM | POA: Diagnosis not present

## 2020-02-25 DIAGNOSIS — R6520 Severe sepsis without septic shock: Secondary | ICD-10-CM

## 2020-02-25 DIAGNOSIS — G9340 Encephalopathy, unspecified: Secondary | ICD-10-CM

## 2020-02-25 DIAGNOSIS — D631 Anemia in chronic kidney disease: Secondary | ICD-10-CM | POA: Diagnosis present

## 2020-02-25 DIAGNOSIS — E1122 Type 2 diabetes mellitus with diabetic chronic kidney disease: Secondary | ICD-10-CM | POA: Diagnosis present

## 2020-02-25 DIAGNOSIS — E7220 Disorder of urea cycle metabolism, unspecified: Secondary | ICD-10-CM

## 2020-02-25 DIAGNOSIS — I129 Hypertensive chronic kidney disease with stage 1 through stage 4 chronic kidney disease, or unspecified chronic kidney disease: Secondary | ICD-10-CM | POA: Diagnosis present

## 2020-02-25 LAB — COMPREHENSIVE METABOLIC PANEL
ALT: 27 U/L (ref 0–44)
AST: 51 U/L — ABNORMAL HIGH (ref 15–41)
Albumin: 2.7 g/dL — ABNORMAL LOW (ref 3.5–5.0)
Alkaline Phosphatase: 69 U/L (ref 38–126)
Anion gap: 13 (ref 5–15)
BUN: 51 mg/dL — ABNORMAL HIGH (ref 8–23)
CO2: 13 mmol/L — ABNORMAL LOW (ref 22–32)
Calcium: 8 mg/dL — ABNORMAL LOW (ref 8.9–10.3)
Chloride: 118 mmol/L — ABNORMAL HIGH (ref 98–111)
Creatinine, Ser: 3.95 mg/dL — ABNORMAL HIGH (ref 0.61–1.24)
GFR, Estimated: 16 mL/min — ABNORMAL LOW (ref >60)
Glucose, Bld: 100 mg/dL — ABNORMAL HIGH (ref 70–99)
Potassium: 4.2 mmol/L (ref 3.5–5.1)
Sodium: 144 mmol/L (ref 135–145)
Total Bilirubin: 4.7 mg/dL — ABNORMAL HIGH (ref 0.3–1.2)
Total Protein: 6.9 g/dL (ref 6.5–8.1)

## 2020-02-25 LAB — CORTISOL-AM, BLOOD: Cortisol - AM: 15.9 ug/dL (ref 6.7–22.6)

## 2020-02-25 LAB — RESP PANEL BY RT-PCR (FLU A&B, COVID) ARPGX2
Influenza A by PCR: NEGATIVE
Influenza B by PCR: NEGATIVE
SARS Coronavirus 2 by RT PCR: NEGATIVE

## 2020-02-25 LAB — CBC
HCT: 22.2 % — ABNORMAL LOW (ref 39.0–52.0)
Hemoglobin: 7.6 g/dL — ABNORMAL LOW (ref 13.0–17.0)
MCH: 33 pg (ref 26.0–34.0)
MCHC: 34.2 g/dL (ref 30.0–36.0)
MCV: 96.5 fL (ref 80.0–100.0)
Platelets: 192 10*3/uL (ref 150–400)
RBC: 2.3 MIL/uL — ABNORMAL LOW (ref 4.22–5.81)
RDW: 18 % — ABNORMAL HIGH (ref 11.5–15.5)
WBC: 12.6 10*3/uL — ABNORMAL HIGH (ref 4.0–10.5)
nRBC: 0 % (ref 0.0–0.2)

## 2020-02-25 LAB — TROPONIN I (HIGH SENSITIVITY): Troponin I (High Sensitivity): 27 ng/L — ABNORMAL HIGH (ref <18)

## 2020-02-25 LAB — PROTIME-INR
INR: 1.7 — ABNORMAL HIGH (ref 0.8–1.2)
Prothrombin Time: 19.1 seconds — ABNORMAL HIGH (ref 11.4–15.2)

## 2020-02-25 LAB — AMMONIA: Ammonia: 86 umol/L — ABNORMAL HIGH (ref 9–35)

## 2020-02-25 LAB — PROCALCITONIN: Procalcitonin: 0.72 ng/mL

## 2020-02-25 MED ORDER — ONDANSETRON HCL 4 MG PO TABS: 4.0000 mg | ORAL_TABLET | Freq: Four times a day (QID) | ORAL | Status: DC | PRN

## 2020-02-25 MED ORDER — LACTULOSE ENEMA
300.0000 mL | Freq: Once | ORAL | Status: DC
  Filled 2020-02-25: qty 300

## 2020-02-25 MED ORDER — METRONIDAZOLE IN NACL 5-0.79 MG/ML-% IV SOLN
500.0000 mg | Freq: Three times a day (TID) | INTRAVENOUS | Status: DC
  Administered 2020-02-25 – 2020-02-26 (×5): 500 mg via INTRAVENOUS
  Filled 2020-02-25 (×5): qty 100

## 2020-02-25 MED ORDER — VANCOMYCIN HCL IN DEXTROSE 1-5 GM/200ML-% IV SOLN: 1000.0000 mg | Freq: Once | INTRAVENOUS | Status: DC

## 2020-02-25 MED ORDER — THIAMINE HCL 100 MG/ML IJ SOLN
100.0000 mg | Freq: Every day | INTRAMUSCULAR | Status: DC
  Administered 2020-02-25 – 2020-02-28 (×4): 100 mg via INTRAVENOUS
  Filled 2020-02-25 (×4): qty 2

## 2020-02-25 MED ORDER — ONDANSETRON HCL 4 MG/2ML IJ SOLN
4.0000 mg | Freq: Four times a day (QID) | INTRAMUSCULAR | Status: DC | PRN
  Administered 2020-02-27: 4 mg via INTRAVENOUS
  Filled 2020-02-25: qty 2

## 2020-02-25 MED ORDER — LACTULOSE 10 GM/15ML PO SOLN
30.0000 g | Freq: Three times a day (TID) | ORAL | Status: DC
  Administered 2020-02-25 – 2020-03-01 (×15): 30 g via ORAL
  Filled 2020-02-25 (×16): qty 60

## 2020-02-25 NOTE — Progress Notes (Signed)
Pharmacy Antibiotic Note  Darren Howard is a 70 y.o. male admitted on 02/24/2020 with AMS and AKI.  Pharmacy has been consulted for Vancomycin and Cefepime dosing for r/o sepsis.  SCr 4.22 (baseline 1.21 Jan 2020) CrCl 17.5, weight 68kg   Plan: Vanc 1000mg  x 1 pulse dosing 1/9@2300  Cefepime 2gm IV q24hrs  Height: 5\' 10"  (177.8 cm) Weight: 68 kg (150 lb) IBW/kg (Calculated) : 73  Temp (24hrs), Avg:97.3 F (36.3 C), Min:96.8 F (36 C), Max:98.1 F (36.7 C)  Recent Labs  Lab 02/24/20 1940 02/24/20 2230 02/25/20 0500 02/25/20 0739  WBC 14.5*  --   --  12.6*  CREATININE 4.22*  --  3.95*  --   LATICACIDVEN 3.1* 2.2*  --   --     Estimated Creatinine Clearance: 17.5 mL/min (A) (by C-G formula based on SCr of 3.95 mg/dL (H)).    No Known Allergies  Antimicrobials this admission: Vanc 1/7 >> Cefepime 1/7 >> Zosyn 1/7 x 1  Dose adjustments this admission:   Microbiology results: pending  Thank you for allowing pharmacy to be a part of this patient's care.  Thomasenia Sales, PharmD, MBA, BCGP Clinical Pharmacist   02/25/2020 9:25 AM

## 2020-02-25 NOTE — ED Notes (Signed)
ED TO INPATIENT HANDOFF REPORT  ED Nurse Name and Phone #: Mazelle Huebert [phone]  S Name/Age/Gender Darren Howard 70 y.o. male Room/Bed: APA18/APA18  Code Status   Code Status: Full Code  Home/SNF/Other Home Patient oriented to: self Is this baseline? No   Triage Complete: Triage complete  Chief Complaint Acute encephalopathy [G93.40] Hepatic encephalopathy ([place]) [K72.90]  Triage Note Pt to er, per ems pt was sent to er for altered mental status, states that the when they arrived there was no heat on in the house and pt was in his own excrement,  Cleaned pt, pt has redness and excoriation to his buttocks, gown and warm blankets placed, pt eyes open and responds to verbal stim with a "yes", however, pt doesn't answer questions.    Unable to review history, screenings, si, and violence assessment, because pt only answers yes to questions    Allergies No Known Allergies  Level of Care/Admitting Diagnosis ED Disposition    ED Disposition Condition [place]: [facility] [[id number]]  Level of Care: Telemetry [5]  Covid Evaluation: Confirmed COVID Negative  Diagnosis: Hepatic encephalopathy ([place]) [572.2.ICD-9-CM]  Admitting Physician: Murlean Iba [[id number]]  Attending Physician: Murlean Iba [[id number]]  Estimated length of stay: past midnight tomorrow  Certification:: I certify this patient will need inpatient services for at least 2 midnights       B Medical/Surgery History Past Medical History:  Diagnosis Date  . Alcohol abuse   . Anemia   . DM2 (diabetes mellitus, type 2) ([place]) 02/13/2014  . Duodenal erosion   . Erosive gastropathy   . GI bleed 04/2018  . Glucose intolerance (impaired glucose tolerance)   . Hyperlipemia   . Hypertension   . Noncompliance    Past Surgical History:  Procedure Laterality Date  . BIOPSY  05/17/2018   Procedure: BIOPSY;  Surgeon: Daneil Dolin, MD;  [facility];  Service: Endoscopy;;   . BIOPSY  06/15/2019   Procedure: BIOPSY;  Surgeon: Daneil Dolin, MD;  [facility];  Service: Endoscopy;;  . BIOPSY  01/21/2020   Procedure: BIOPSY;  Surgeon: Harvel Quale, MD;  [facility];  Service: Gastroenterology;;  duodenal polyp;  . CARDIAC CATHETERIZATION    . COLONOSCOPY N/A 06/15/2019   Procedure: COLONOSCOPY;  Surgeon: Daneil Dolin, MD;  [facility];  Service: Endoscopy;  Laterality: N/A;  . COLONOSCOPY WITH PROPOFOL N/A 06/01/2018   inadequate colon prep, hemorrhoids on perianal exam, two 4-6 mm polyps at hepatic flexure, one 18 mm polyp at IC valve s/p piecemeal removal (tubular adenomas). was to have surveillance colonoscopy in 6 months but did not return to the office  . COLONOSCOPY WITH PROPOFOL N/A 01/21/2020   Procedure: COLONOSCOPY WITH PROPOFOL;  Surgeon: Harvel Quale, MD;  [facility];  Service: Gastroenterology;  Laterality: N/A;  . ESOPHAGOGASTRODUODENOSCOPY (EGD) WITH PROPOFOL N/A 05/17/2018   Dr. Gala Romney: normal esophagus, erosive gastropathy s/p biopsy, duodenal erosions, felt to be NSAID effect  . ESOPHAGOGASTRODUODENOSCOPY (EGD) WITH PROPOFOL N/A 01/21/2020   Procedure: ESOPHAGOGASTRODUODENOSCOPY (EGD) WITH PROPOFOL;  Surgeon: Harvel Quale, MD;  [facility];  Service: Gastroenterology;  Laterality: N/A;  . HERNIA REPAIR    . POLYPECTOMY  06/01/2018   Procedure: POLYPECTOMY;  Surgeon: Daneil Dolin, MD;  [facility];  Service: Endoscopy;;  colon     A IV Location/Drains/Wounds Patient Lines/Drains/Airways Status    Active  Line/Drains/Airways    Name Placement date Placement time Site Days   Peripheral IV 02/24/20 Anterior;Right Forearm 02/24/20  1959  Forearm  1   External Urinary Catheter 02/24/20  2010  -  1          Intake/Output Last 24 hours  Intake/Output Summary (Last 24 hours) at 02/25/2020 1159 Last data filed at 02/25/2020 0213 Gross per  24 hour  Intake 3950 ml  Output -  Net 3950 ml    Labs/Imaging Results for orders placed or performed during the hospital encounter of 02/24/20 (from the past 48 hour(s))  Comprehensive metabolic panel     Status: Abnormal   Collection Time: 02/24/20  7:40 PM  Result Value Ref Range   Sodium 141 135 - 145 mmol/L   Potassium 4.3 3.5 - 5.1 mmol/L   Chloride 114 (H) 98 - 111 mmol/L   CO2 14 (L) 22 - 32 mmol/L   Glucose, Bld 93 70 - 99 mg/dL    Comment: Glucose reference range applies only to samples taken after fasting for at least 8 hours.   BUN 53 (H) 8 - 23 mg/dL   Creatinine, Ser 4.22 (H) 0.61 - 1.24 mg/dL   Calcium 8.2 (L) 8.9 - 10.3 mg/dL   Total Protein 7.1 6.5 - 8.1 g/dL   Albumin 2.8 (L) 3.5 - 5.0 g/dL   AST 45 (H) 15 - 41 U/L   ALT 22 0 - 44 U/L   Alkaline Phosphatase 74 38 - 126 U/L   Total Bilirubin 4.3 (H) 0.3 - 1.2 mg/dL   GFR, Estimated 15 (L) >60 mL/min    Comment: (NOTE) Calculated using the CKD-EPI Creatinine Equation (2021)    Anion gap 13 5 - 15    Comment: Performed at [facility], [address], [place], [place] [address]  Lipase, blood     Status: None   Collection Time: 02/24/20  7:40 PM  Result Value Ref Range   Lipase 14 11 - 51 U/L    Comment: Performed at [facility], [address], [place], [place] [address]  Ethanol     Status: None   Collection Time: 02/24/20  7:40 PM  Result Value Ref Range   Alcohol, Ethyl (B) <10 <10 mg/dL    Comment: (NOTE) Lowest detectable limit for serum alcohol is 10 mg/dL.  For medical purposes only. Performed at [facility], [address] [place], [place] [address]   CK     Status: None   Collection Time: 02/24/20  7:40 PM  Result Value Ref Range   Total CK 63 49 - 397 U/L    Comment: Performed at [facility], [address] [place], [place] [address]  CBC with Differential     Status: Abnormal   Collection Time: 02/24/20  7:40 PM  Result Value Ref Range   WBC 14.5 (H) 4.0 - 10.5 K/uL   RBC 2.49 (L)  4.22 - 5.81 MIL/uL   Hemoglobin 8.0 (L) 13.0 - 17.0 g/dL   HCT 24.0 (L) 39.0 - 52.0 %   MCV 96.4 80.0 - 100.0 fL   MCH 32.1 26.0 - 34.0 pg   MCHC 33.3 30.0 - 36.0 g/dL   RDW 17.4 (H) 11.5 - 15.5 %   Platelets 235 150 - 400 K/uL   nRBC 0.0 0.0 - 0.2 %   Neutrophils Relative % 80 %   Neutro Abs 11.4 (H) 1.7 - 7.7 K/uL   Lymphocytes Relative 10 %   Lymphs Abs 1.5  0.7 - 4.0 K/uL   Monocytes Relative 10 %   Monocytes Absolute 1.4 (H) 0.1 - 1.0 K/uL   Eosinophils Relative 0 %   Eosinophils Absolute 0.1 0.0 - 0.5 K/uL   Basophils Relative 0 %   Basophils Absolute 0.1 0.0 - 0.1 K/uL   Immature Granulocytes 0 %   Abs Immature Granulocytes 0.05 0.00 - 0.07 K/uL    Comment: Performed at [facility], [address], [place], [place] [address]  Ammonia     Status: Abnormal   Collection Time: 02/24/20  7:40 PM  Result Value Ref Range   Ammonia 143 (H) 9 - 35 umol/L    Comment: Performed at [facility], [address] [place], [place] [address]  Lactic acid, plasma     Status: Abnormal   Collection Time: 02/24/20  7:40 PM  Result Value Ref Range   Lactic Acid, Venous 3.1 (HH) 0.5 - 1.9 mmol/L    Comment: CRITICAL RESULT CALLED TO, READ BACK BY AND VERIFIED WITH: POWELL,A ON 02/24/20 AT 2055 BY LOY,C Performed at [facility], [address] [place], [place] [address]   Culture, blood (Routine X 2) w Reflex to ID Panel     Status: None (Preliminary result)   Collection Time: 02/24/20  7:40 PM   Specimen: BLOOD RIGHT FOREARM  Result Value Ref Range   Specimen Description BLOOD RIGHT FOREARM    Special Requests      BOTTLES DRAWN AEROBIC AND ANAEROBIC Blood Culture results may not be optimal due to an inadequate volume of blood received in culture bottles   Culture      NO GROWTH < 12 HOURS Performed at [facility], [address] [place], [place] [address]    Report Status PENDING   Culture, blood (Routine X 2) w Reflex to ID Panel     Status: None (Preliminary result)   Collection  Time: 02/24/20  7:43 PM   Specimen: Left Antecubital; Blood  Result Value Ref Range   Specimen Description LEFT ANTECUBITAL    Special Requests      BOTTLES DRAWN AEROBIC AND ANAEROBIC Blood Culture results may not be optimal due to an inadequate volume of blood received in culture bottles   Culture      NO GROWTH < 12 HOURS Performed at [facility], [address] [place], [place] [address]    Report Status PENDING   Protime-INR     Status: Abnormal   Collection Time: 02/24/20  7:43 PM  Result Value Ref Range   Prothrombin Time 19.1 (H) 11.4 - 15.2 seconds   INR 1.7 (H) 0.8 - 1.2    Comment: (NOTE) INR goal varies based on device and disease states. Performed at [facility], [address] [place], [place] [address]   Lactic acid, plasma     Status: Abnormal   Collection Time: 02/24/20 10:30 PM  Result Value Ref Range   Lactic Acid, Venous 2.2 (HH) 0.5 - 1.9 mmol/L    Comment: CRITICAL VALUE NOTED.  VALUE IS CONSISTENT WITH PREVIOUSLY REPORTED AND CALLED VALUE. Performed at [facility], [address] [place], [place] [address]   Troponin I (High Sensitivity)     Status: Abnormal   Collection Time: 02/24/20 10:30 PM  Result Value Ref Range   Troponin I (High Sensitivity) 21 (H) <18 ng/L    Comment: (NOTE) Elevated high sensitivity troponin I (hsTnI) values and significant  changes across serial measurements may suggest ACS but many other  chronic and acute  conditions are known to elevate hsTnI results.  Refer to the "Links" section for chest pain algorithms and additional  guidance. Performed at [facility], [address] [place], [place] [address]   Urinalysis, Routine w reflex microscopic Urine, Clean Catch     Status: Abnormal   Collection Time: 02/24/20 10:37 PM  Result Value Ref Range   Color, Urine YELLOW YELLOW   APPearance CLEAR CLEAR   Specific Gravity, Urine 1.013 1.005 - 1.030   pH 5.0 5.0 - 8.0   Glucose, UA NEGATIVE NEGATIVE mg/dL   Hgb urine dipstick  NEGATIVE NEGATIVE   Bilirubin Urine NEGATIVE NEGATIVE   Ketones, ur 5 (A) NEGATIVE mg/dL   Protein, ur NEGATIVE NEGATIVE mg/dL   Nitrite NEGATIVE NEGATIVE   Leukocytes,Ua NEGATIVE NEGATIVE    Comment: Performed at [facility], [address] [place], [place] [address]  Urine rapid drug screen (hosp performed)     Status: None   Collection Time: 02/24/20 10:37 PM  Result Value Ref Range   Opiates NONE DETECTED NONE DETECTED   Cocaine NONE DETECTED NONE DETECTED   Benzodiazepines NONE DETECTED NONE DETECTED   Amphetamines NONE DETECTED NONE DETECTED   Tetrahydrocannabinol NONE DETECTED NONE DETECTED   Barbiturates NONE DETECTED NONE DETECTED    Comment: (NOTE) DRUG SCREEN FOR MEDICAL PURPOSES ONLY.  IF CONFIRMATION IS NEEDED FOR ANY PURPOSE, NOTIFY LAB WITHIN 5 DAYS.  LOWEST DETECTABLE LIMITS FOR URINE DRUG SCREEN Drug Class                     Cutoff (ng/mL) Amphetamine and metabolites    1000 Barbiturate and metabolites    200 Benzodiazepine                 202 Tricyclics and metabolites     300 Opiates and metabolites        300 Cocaine and metabolites        300 THC                            50 Performed at [facility], [address] [place], [place] [address]   Troponin I (High Sensitivity)     Status: Abnormal   Collection Time: 02/25/20 12:36 AM  Result Value Ref Range   Troponin I (High Sensitivity) 27 (H) <18 ng/L    Comment: (NOTE) Elevated high sensitivity troponin I (hsTnI) values and significant  changes across serial measurements may suggest ACS but many other  chronic and acute conditions are known to elevate hsTnI results.  Refer to the "Links" section for chest pain algorithms and additional  guidance. Performed at [facility], [address], [place], [place] [address]   Procalcitonin     Status: None   Collection Time: 02/25/20  5:00 AM  Result Value Ref Range   Procalcitonin 0.72 ng/mL    Comment:        Interpretation: PCT > 0.5 ng/mL and <=  2 ng/mL: Systemic infection (sepsis) is possible, but other conditions are known to elevate PCT as well. (NOTE)       Sepsis PCT Algorithm           Lower Respiratory Tract                                      Infection PCT Algorithm    ----------------------------     ----------------------------  PCT < 0.25 ng/mL                PCT < 0.10 ng/mL          Strongly encourage             Strongly discourage   discontinuation of antibiotics    initiation of antibiotics    ----------------------------     -----------------------------       PCT 0.25 - 0.50 ng/mL            PCT 0.10 - 0.25 ng/mL               OR       >80% decrease in PCT            Discourage initiation of                                            antibiotics      Encourage discontinuation           of antibiotics    ----------------------------     -----------------------------         PCT >= 0.50 ng/mL              PCT 0.26 - 0.50 ng/mL                AND       <80% decrease in PCT             Encourage initiation of                                             antibiotics       Encourage continuation           of antibiotics    ----------------------------     -----------------------------        PCT >= 0.50 ng/mL                  PCT > 0.50 ng/mL               AND         increase in PCT                  Strongly encourage                                      initiation of antibiotics    Strongly encourage escalation           of antibiotics                                     -----------------------------                                           PCT <= 0.25 ng/mL  OR                                        > 80% decrease in PCT                                      Discontinue / Do not initiate                                             antibiotics  Performed at [facility], [address], [place], [place] [address]   Comprehensive metabolic panel      Status: Abnormal   Collection Time: 02/25/20  5:00 AM  Result Value Ref Range   Sodium 144 135 - 145 mmol/L   Potassium 4.2 3.5 - 5.1 mmol/L   Chloride 118 (H) 98 - 111 mmol/L   CO2 13 (L) 22 - 32 mmol/L   Glucose, Bld 100 (H) 70 - 99 mg/dL    Comment: Glucose reference range applies only to samples taken after fasting for at least 8 hours.   BUN 51 (H) 8 - 23 mg/dL   Creatinine, Ser 3.95 (H) 0.61 - 1.24 mg/dL   Calcium 8.0 (L) 8.9 - 10.3 mg/dL   Total Protein 6.9 6.5 - 8.1 g/dL   Albumin 2.7 (L) 3.5 - 5.0 g/dL   AST 51 (H) 15 - 41 U/L   ALT 27 0 - 44 U/L   Alkaline Phosphatase 69 38 - 126 U/L   Total Bilirubin 4.7 (H) 0.3 - 1.2 mg/dL   GFR, Estimated 16 (L) >60 mL/min    Comment: (NOTE) Calculated using the CKD-EPI Creatinine Equation (2021)    Anion gap 13 5 - 15    Comment: Performed at [facility], [address], [place],  [address]  Resp Panel by RT-PCR (Flu A&B, Covid) Nasopharyngeal Swab     Status: None   Collection Time: 02/25/20  5:46 AM   Specimen: Nasopharyngeal Swab; Nasopharyngeal(NP) swabs in vial transport medium  Result Value Ref Range   SARS Coronavirus 2 by RT PCR NEGATIVE NEGATIVE    Comment: (NOTE) SARS-CoV-2 target nucleic acids are NOT DETECTED.  The SARS-CoV-2 RNA is generally detectable in upper respiratory specimens during the acute phase of infection. The lowest concentration of SARS-CoV-2 viral copies this assay can detect is 138 copies/mL. A negative result does not preclude SARS-Cov-2 infection and should not be used as the sole basis for treatment or other patient management decisions. A negative result may occur with  improper specimen collection/handling, submission of specimen other than nasopharyngeal swab, presence of viral mutation(s) within the areas targeted by this assay, and inadequate number of viral copies(<138 copies/mL). A negative result must be combined with clinical observations, patient history, and  epidemiological information. The expected result is Negative.  Fact Sheet for Patients:  EntrepreneurPulse.com.au  Fact Sheet for Healthcare Providers:  IncredibleEmployment.be  This test is no t yet approved or cleared by the [place] FDA and  has been authorized for detection and/or diagnosis of SARS-CoV-2 by FDA under an Emergency Use Authorization (EUA). This EUA will remain  in effect (meaning this test can be used) for the duration of the COVID-19 declaration under  Section 564(b)(1) of the Act, 21 U.S.C.section 360bbb-3(b)(1), unless the authorization is terminated  or revoked sooner.       Influenza A by PCR NEGATIVE NEGATIVE   Influenza B by PCR NEGATIVE NEGATIVE    Comment: (NOTE) The Xpert Xpress SARS-CoV-2/FLU/RSV plus assay is intended as an aid in the diagnosis of influenza from Nasopharyngeal swab specimens and should not be used as a sole basis for treatment. Nasal washings and aspirates are unacceptable for Xpert Xpress SARS-CoV-2/FLU/RSV testing.  Fact Sheet for Patients: EntrepreneurPulse.com.au  Fact Sheet for Healthcare Providers: IncredibleEmployment.be  This test is not yet approved or cleared by the [place] FDA and has been authorized for detection and/or diagnosis of SARS-CoV-2 by FDA under an Emergency Use Authorization (EUA). This EUA will remain in effect (meaning this test can be used) for the duration of the COVID-19 declaration under Section 564(b)(1) of the Act, 21 U.S.C. section 360bbb-3(b)(1), unless the authorization is terminated or revoked.  Performed at [facility], [address], [place], [place] [address]   Ammonia     Status: Abnormal   Collection Time: 02/25/20  6:30 AM  Result Value Ref Range   Ammonia 86 (H) 9 - 35 umol/L    Comment: Performed at [facility], [address], [place], [place] [address]  CBC     Status: Abnormal   Collection  Time: 02/25/20  7:39 AM  Result Value Ref Range   WBC 12.6 (H) 4.0 - 10.5 K/uL   RBC 2.30 (L) 4.22 - 5.81 MIL/uL   Hemoglobin 7.6 (L) 13.0 - 17.0 g/dL   HCT 22.2 (L) 39.0 - 52.0 %   MCV 96.5 80.0 - 100.0 fL   MCH 33.0 26.0 - 34.0 pg   MCHC 34.2 30.0 - 36.0 g/dL   RDW 18.0 (H) 11.5 - 15.5 %   Platelets 192 150 - 400 K/uL   nRBC 0.0 0.0 - 0.2 %    Comment: Performed at [facility], [address] [place], [place] [address]   CT Abdomen Pelvis Wo Contrast  Result Date: 02/24/2020 CLINICAL DATA:  70 year old male with altered mental status. Abdominal distension. EXAM: CT ABDOMEN AND PELVIS WITHOUT CONTRAST TECHNIQUE: Multidetector CT imaging of the abdomen and pelvis was performed following the standard protocol without IV contrast. COMPARISON:  CT of the pelvis dated 01/18/2020. FINDINGS: Evaluation of this exam is limited in the absence of intravenous contrast. Evaluation is also limited due to streak artifact caused by support wires. Lower chest: The visualized lung bases are clear. No intra-abdominal free air. Moderate ascites. Hepatobiliary: The liver is grossly unremarkable. No calcified gallstone. Pancreas: The pancreas is atrophic Spleen: Normal in size without focal abnormality. Adrenals/Urinary Tract: The adrenal glands unremarkable. There is no hydronephrosis or nephrolithiasis on either side. Right renal cysts measure up to 3 cm in the inferior pole the right kidney. The urinary bladder is minimally distended and grossly unremarkable Stomach/Bowel: Evaluation of the bowel is limited in the absence of oral contrast and due to ascites. There is no bowel obstruction. Vascular/Lymphatic: Mild aortoiliac atherosclerotic disease. The IVC is unremarkable. No portal venous gas. There is no adenopathy. Reproductive: The prostate and seminal vesicles are grossly unremarkable. Other: None Musculoskeletal: Degenerative changes of the spine. No acute osseous pathology. There is extension of ascitic fluid  into the inguinal canals bilaterally. IMPRESSION: 1. Moderate ascites. 2. No hydronephrosis or nephrolithiasis. 3. No evidence of bowel obstruction. 4. Aortic Atherosclerosis (ICD10-I70.0). Electronically Signed   By: Laren Everts.D.  On: 02/24/2020 23:35   CT Head Wo Contrast  Result Date: 02/24/2020 CLINICAL DATA:  70 year old male with altered mental status. EXAM: CT HEAD WITHOUT CONTRAST TECHNIQUE: Contiguous axial images were obtained from the base of the skull through the vertex without intravenous contrast. COMPARISON:  Head CT dated 05/13/2018. FINDINGS: Evaluation of this exam is limited due to motion artifact. Brain: Mild age-related atrophy and chronic microvascular ischemic changes. Old bilateral basal ganglia lacunar infarcts noted. There is no acute intracranial hemorrhage. No mass effect midline shift no extra-axial fluid collection. Vascular: No hyperdense vessel or unexpected calcification. Skull: Normal. Negative for fracture or focal lesion. Sinuses/Orbits: The visualized paranasal sinuses are clear. Right mastoid effusion. The left mastoid air cells are clear. Cerumen noted in the right external auditory canal. Other: None IMPRESSION: 1. No acute intracranial pathology. 2. Age-related atrophy and chronic microvascular ischemic changes. Old bilateral basal ganglia lacunar infarcts. Electronically Signed   By: Anner Crete M.D.   On: 02/24/2020 21:14   DG Chest Port 1 View  Result Date: 02/24/2020 CLINICAL DATA:  Altered mental status. EXAM: PORTABLE CHEST 1 VIEW COMPARISON:  01/18/2020 FINDINGS: The heart size and mediastinal contours are within normal limits. Both lungs are clear. The visualized skeletal structures are unremarkable. IMPRESSION: No active disease. Electronically Signed   By: Marlaine Hind M.D.   On: 02/24/2020 19:41    Pending Labs Unresulted Labs (From admission, onward)          Start     Ordered   02/26/20 0500  Comprehensive metabolic panel  Daily,   R       02/25/20 0937   02/26/20 0500  CBC  Daily,   R      02/25/20 0937   02/26/20 0500  Magnesium  Daily,   R      02/25/20 0937   02/25/20 0612  Ammonia  Daily,   R      02/25/20 0611   02/25/20 0500  Cortisol-am, blood  Tomorrow morning,   R        02/25/20 0043   02/24/20 2355  SARS CORONAVIRUS 2 (TAT 6-24 HRS) Nasopharyngeal Nasopharyngeal Swab  (Tier 3 - Symptomatic/asymptomatic with Precautions)  Once,   STAT       Question Answer Comment  Is this test for diagnosis or screening Screening   Symptomatic for COVID-19 as defined by CDC No   Hospitalized for COVID-19 No   Admitted to ICU for COVID-19 No   Previously tested for COVID-19 Yes   Resident in a congregate (group) care setting No   Employed in healthcare setting No   Has patient completed COVID vaccination(s) (2 doses of Pfizer/Moderna 1 dose of [employer]) Unknown      02/25/20 0009          Vitals/Pain Today's Vitals   02/25/20 0900 02/25/20 0930 02/25/20 1100 02/25/20 1130  BP: 115/60 124/66 107/75 (!) 144/72  Pulse:   88 97  Resp: 13 13 14 16   Temp:      TempSrc:      SpO2:   100% 100%  Weight:      Height:        Isolation Precautions Airborne and Contact precautions  Medications Medications  0.9 %  sodium chloride infusion ( Intravenous Stopped 02/24/20 2206)  vancomycin variable dose per unstable renal function (pharmacist dosing) (has no administration in time range)  ceFEPIme (MAXIPIME) 2 g in sodium chloride 0.9 % 100 mL IVPB (has no administration in  time range)  thiamine (B-1) injection 100 mg (100 mg Intravenous Given 02/25/20 1116)  metroNIDAZOLE (FLAGYL) IVPB 500 mg (500 mg Intravenous New Bag/Given 02/25/20 0519)  ondansetron (ZOFRAN) tablet 4 mg (has no administration in time range)    Or  ondansetron (ZOFRAN) injection 4 mg (has no administration in time range)  lactulose (CHRONULAC) 10 GM/15ML solution 30 g (30 g Oral Given 02/25/20 1116)  lactulose (CHRONULAC) enema 200 gm (300 mLs  Rectal Not Given 02/25/20 0147)  vancomycin (VANCOCIN) IVPB 1000 mg/200 mL premix (has no administration in time range)  sodium chloride 0.9 % bolus 1,000 mL (0 mLs Intravenous Stopped 02/24/20 2119)  ceFEPIme (MAXIPIME) 2 g in sodium chloride 0.9 % 100 mL IVPB (0 g Intravenous Stopped 02/24/20 2157)  sodium chloride 0.9 % bolus 1,000 mL (0 mLs Intravenous Stopped 02/24/20 2229)    And  sodium chloride 0.9 % bolus 1,000 mL (0 mLs Intravenous Stopped 02/24/20 2348)    And  sodium chloride 0.9 % bolus 250 mL (0 mLs Intravenous Stopped 02/25/20 0020)  vancomycin (VANCOREADY) IVPB 1250 mg/250 mL (0 mg Intravenous Stopped 02/25/20 0038)    Mobility non-ambulatory     Focused Assessments    R Recommendations: See Admitting Provider Note  Report given to:   Additional Notes:

## 2020-02-25 NOTE — H&P (Signed)
History and Physical    Darren Howard:[id number] DOB: 1950-06-09 DOA: 02/24/2020  PCP: [name], MD  Patient coming from: Home  I have personally briefly reviewed patient's old medical records in [place]  Chief Complaint: AMS  HPI: Darren Howard is a 69 y.o. male with medical history significant of prior EtOH abuse in past, HTN, cirrhosis.  Pt presents to the ED with AMS.  EMS was apparently called to patients house: on arrival to house there was no heat on in the house and patient was in his own excrement.  Pt noted to have AMS, brought in to ED.  Patient unable to provide further history or ROS secondary to AMS.   ED Course: WBC 14k, Creat 4.22, ammonia 143, INR 1.7.  HGB 8.0 (9.4 last month).  Lactate 3.1.  T 96.8, HR 108.  Pt treated initially as a suspected sepsis.  Given empiric cefepime / vanc  UA neg.  CXR neg.  Ct abd/pelvis w/o contrast: 1) Mod ascites 2) no bowel obstruction 3) No mention of hernia but they note "There is extension of ascitic fluid into the inguinal canals bilaterally." (presumably the cause of his inguinal swelling.   Review of Systems: Unable to perform due to AMS  Past Medical History:  Diagnosis Date  . Alcohol abuse   . Anemia   . DM2 (diabetes mellitus, type 2) ([place]) 02/13/2014  . Duodenal erosion   . Erosive gastropathy   . GI bleed 04/2018  . Glucose intolerance (impaired glucose tolerance)   . Hyperlipemia   . Hypertension   . Noncompliance     Past Surgical History:  Procedure Laterality Date  . BIOPSY  05/17/2018   Procedure: BIOPSY;  Surgeon: Daneil Dolin, MD;  [facility];  Service: Endoscopy;;  . BIOPSY  06/15/2019   Procedure: BIOPSY;  Surgeon: Daneil Dolin, MD;  [facility];  Service: Endoscopy;;  . BIOPSY  01/21/2020   Procedure: BIOPSY;  Surgeon: Harvel Quale, MD;  [facility];  Service: Gastroenterology;;  duodenal polyp;  . CARDIAC  CATHETERIZATION    . COLONOSCOPY N/A 06/15/2019   Procedure: COLONOSCOPY;  Surgeon: Daneil Dolin, MD;  [facility];  Service: Endoscopy;  Laterality: N/A;  . COLONOSCOPY WITH PROPOFOL N/A 06/01/2018   inadequate colon prep, hemorrhoids on perianal exam, two 4-6 mm polyps at hepatic flexure, one 18 mm polyp at IC valve s/p piecemeal removal (tubular adenomas). was to have surveillance colonoscopy in 6 months but did not return to the office  . COLONOSCOPY WITH PROPOFOL N/A 01/21/2020   Procedure: COLONOSCOPY WITH PROPOFOL;  Surgeon: Harvel Quale, MD;  [facility];  Service: Gastroenterology;  Laterality: N/A;  . ESOPHAGOGASTRODUODENOSCOPY (EGD) WITH PROPOFOL N/A 05/17/2018   Dr. Gala Romney: normal esophagus, erosive gastropathy s/p biopsy, duodenal erosions, felt to be NSAID effect  . ESOPHAGOGASTRODUODENOSCOPY (EGD) WITH PROPOFOL N/A 01/21/2020   Procedure: ESOPHAGOGASTRODUODENOSCOPY (EGD) WITH PROPOFOL;  Surgeon: Harvel Quale, MD;  [facility];  Service: Gastroenterology;  Laterality: N/A;  . HERNIA REPAIR    . POLYPECTOMY  06/01/2018   Procedure: POLYPECTOMY;  Surgeon: Daneil Dolin, MD;  [facility];  Service: Endoscopy;;  colon     reports that he has been smoking cigarettes. He has a 15.00 pack-year smoking history. He has never used smokeless tobacco. He reports previous alcohol use of about 2.0 standard drinks of alcohol per week. He reports that  he does not use drugs.  No Known Allergies  Family History  Problem Relation Age of Onset  . Congestive Heart Failure Mother   . Congestive Heart Failure Father   . Cancer Brother        unknown kind   . Colon cancer Neg Hx   . Colon polyps Neg Hx      Prior to Admission medications   Medication Sig Start Date End Date Taking? Authorizing Provider  carvedilol (COREG) 3.125 MG tablet Take 1 tablet (3.125 mg total) by mouth 2 (two) times daily with a meal. 01/26/20    Kathie Dike, MD  furosemide (LASIX) 20 MG tablet Take 1 tablet (20 mg total) by mouth daily. 01/27/20   Kathie Dike, MD  hydrocortisone (ANUSOL-HC) 25 MG suppository Place 1 suppository (25 mg total) rectally 2 (two) times daily. 01/26/20   Kathie Dike, MD  lactulose (CHRONULAC) 10 GM/15ML solution Take 45 mLs (30 g total) by mouth daily. 01/27/20   Kathie Dike, MD  midodrine (PROAMATINE) 2.5 MG tablet Take 3 tablets (7.5 mg total) by mouth 3 (three) times daily with meals. 01/26/20   Kathie Dike, MD  pantoprazole (PROTONIX) 40 MG tablet Take 1 tablet (40 mg total) by mouth daily for 30 days. 05/19/18 06/18/18  Manuella Ghazi, Pratik D, DO  saccharomyces boulardii (FLORASTOR) 250 MG capsule Take 1 capsule (250 mg total) by mouth 2 (two) times daily. Patient not taking: Reported on 01/18/2020 06/20/19   Barton Dubois, MD  spironolactone (ALDACTONE) 50 MG tablet Take 1 tablet (50 mg total) by mouth daily. 01/27/20   Kathie Dike, MD    Physical Exam: Vitals:   02/24/20 2200 02/24/20 2230 02/24/20 2330 02/25/20 0000  BP: (!) 143/77 112/77 112/77 123/72  Pulse: (!) 106  (!) 108 100  Resp: 19 17 17 13   Temp: (!) 97.1 F (36.2 C)  (!) 97.1 F (36.2 C) (!) 97.5 F (36.4 C)  TempSrc: Oral  Oral Oral  SpO2: 100%  98% 99%  Weight:      Height:        Constitutional: Lethargic, altered Eyes: PERRL, lids and conjunctivae normal ENMT: Mucous membranes are moist. Posterior pharynx clear of any exudate or lesions.Normal dentition.  Neck: normal, supple, no masses, no thyromegaly Respiratory: clear to auscultation bilaterally, no wheezing, no crackles. Normal respiratory effort. No accessory muscle use.  Cardiovascular: Regular rate and rhythm, no murmurs / rubs / gallops. No extremity edema. 2+ pedal pulses. No carotid bruits.  Abdomen: Fluid wave, inguinal swelling present. Musculoskeletal: no clubbing / cyanosis. No joint deformity upper and lower extremities. Good ROM, no contractures.  Normal muscle tone.  Skin: no rashes, lesions, ulcers. No induration Neurologic: MAE Psychiatric: Lethargic, altered   Labs on Admission: I have personally reviewed following labs and imaging studies  CBC: Recent Labs  Lab 02/24/20 1940  WBC 14.5*  NEUTROABS 11.4*  HGB 8.0*  HCT 24.0*  MCV 96.4  PLT 678   Basic Metabolic Panel: Recent Labs  Lab 02/24/20 1940  NA 141  K 4.3  CL 114*  CO2 14*  GLUCOSE 93  BUN 53*  CREATININE 4.22*  CALCIUM 8.2*   GFR: Estimated Creatinine Clearance: 16.3 mL/min (A) (by C-G formula based on SCr of 4.22 mg/dL (H)). Liver Function Tests: Recent Labs  Lab 02/24/20 1940  AST 45*  ALT 22  ALKPHOS 74  BILITOT 4.3*  PROT 7.1  ALBUMIN 2.8*   Recent Labs  Lab 02/24/20 1940  LIPASE 14   Recent  Labs  Lab 02/24/20 1940  AMMONIA 143*   Coagulation Profile: Recent Labs  Lab 02/24/20 1943  INR 1.7*   Cardiac Enzymes: Recent Labs  Lab 02/24/20 1940  CKTOTAL 63   BNP (last 3 results) No results for input(s): PROBNP in the last 8760 hours. HbA1C: No results for input(s): HGBA1C in the last 72 hours. CBG: No results for input(s): GLUCAP in the last 168 hours. Lipid Profile: No results for input(s): CHOL, HDL, LDLCALC, TRIG, CHOLHDL, LDLDIRECT in the last 72 hours. Thyroid Function Tests: No results for input(s): TSH, T4TOTAL, FREET4, T3FREE, THYROIDAB in the last 72 hours. Anemia Panel: No results for input(s): VITAMINB12, FOLATE, FERRITIN, TIBC, IRON, RETICCTPCT in the last 72 hours. Urine analysis:    Component Value Date/Time   COLORURINE YELLOW 02/24/2020 2237   APPEARANCEUR CLEAR 02/24/2020 2237   LABSPEC 1.013 02/24/2020 2237   PHURINE 5.0 02/24/2020 2237   GLUCOSEU NEGATIVE 02/24/2020 2237   HGBUR NEGATIVE 02/24/2020 2237   BILIRUBINUR NEGATIVE 02/24/2020 2237   KETONESUR 5 (A) 02/24/2020 2237   PROTEINUR NEGATIVE 02/24/2020 2237   UROBILINOGEN 0.2 06/14/2008 1930   NITRITE NEGATIVE 02/24/2020 2237    LEUKOCYTESUR NEGATIVE 02/24/2020 2237    Radiological Exams on Admission: CT Abdomen Pelvis Wo Contrast  Result Date: 02/24/2020 CLINICAL DATA:  70 year old male with altered mental status. Abdominal distension. EXAM: CT ABDOMEN AND PELVIS WITHOUT CONTRAST TECHNIQUE: Multidetector CT imaging of the abdomen and pelvis was performed following the standard protocol without IV contrast. COMPARISON:  CT of the pelvis dated 01/18/2020. FINDINGS: Evaluation of this exam is limited in the absence of intravenous contrast. Evaluation is also limited due to streak artifact caused by support wires. Lower chest: The visualized lung bases are clear. No intra-abdominal free air. Moderate ascites. Hepatobiliary: The liver is grossly unremarkable. No calcified gallstone. Pancreas: The pancreas is atrophic Spleen: Normal in size without focal abnormality. Adrenals/Urinary Tract: The adrenal glands unremarkable. There is no hydronephrosis or nephrolithiasis on either side. Right renal cysts measure up to 3 cm in the inferior pole the right kidney. The urinary bladder is minimally distended and grossly unremarkable Stomach/Bowel: Evaluation of the bowel is limited in the absence of oral contrast and due to ascites. There is no bowel obstruction. Vascular/Lymphatic: Mild aortoiliac atherosclerotic disease. The IVC is unremarkable. No portal venous gas. There is no adenopathy. Reproductive: The prostate and seminal vesicles are grossly unremarkable. Other: None Musculoskeletal: Degenerative changes of the spine. No acute osseous pathology. There is extension of ascitic fluid into the inguinal canals bilaterally. IMPRESSION: 1. Moderate ascites. 2. No hydronephrosis or nephrolithiasis. 3. No evidence of bowel obstruction. 4. Aortic Atherosclerosis (ICD10-I70.0). Electronically Signed   By: Anner Crete M.D.   On: 02/24/2020 23:35   CT Head Wo Contrast  Result Date: 02/24/2020 CLINICAL DATA:  70 year old male with altered  mental status. EXAM: CT HEAD WITHOUT CONTRAST TECHNIQUE: Contiguous axial images were obtained from the base of the skull through the vertex without intravenous contrast. COMPARISON:  Head CT dated 05/13/2018. FINDINGS: Evaluation of this exam is limited due to motion artifact. Brain: Mild age-related atrophy and chronic microvascular ischemic changes. Old bilateral basal ganglia lacunar infarcts noted. There is no acute intracranial hemorrhage. No mass effect midline shift no extra-axial fluid collection. Vascular: No hyperdense vessel or unexpected calcification. Skull: Normal. Negative for fracture or focal lesion. Sinuses/Orbits: The visualized paranasal sinuses are clear. Right mastoid effusion. The left mastoid air cells are clear. Cerumen noted in the right external auditory canal. Other:  None IMPRESSION: 1. No acute intracranial pathology. 2. Age-related atrophy and chronic microvascular ischemic changes. Old bilateral basal ganglia lacunar infarcts. Electronically Signed   By: Anner Crete M.D.   On: 02/24/2020 21:14   DG Chest Port 1 View  Result Date: 02/24/2020 CLINICAL DATA:  Altered mental status. EXAM: PORTABLE CHEST 1 VIEW COMPARISON:  01/18/2020 FINDINGS: The heart size and mediastinal contours are within normal limits. Both lungs are clear. The visualized skeletal structures are unremarkable. IMPRESSION: No active disease. Electronically Signed   By: Marlaine Hind M.D.   On: 02/24/2020 19:41    EKG: Independently reviewed.  Assessment/Plan Principal Problem:   Acute encephalopathy Active Problems:   AKI (acute kidney injury) ([place])   Hepatorenal syndrome (HCC)   Alcoholic cirrhosis of liver with ascites (HCC)   Sepsis (HCC)   Hyperammonemia ([place])    1. Acute encephalopathy - 1. CT head neg 2. Contributing factors may include: Hyperammonemia, AKI, possible sepsis. 3. See below regarding treatment. 2. Hyperammonemia - 1. And possible hepatic encephalopathy 2. Lactulose 30g  TID ordered 3. Lactulose PR ordered if unable to take POs (spoke with RN) 3. AKI - 1. CPK nl 2. UA unremarkable 3. Possibly due to dehydration? Vs hepatorenal syndrome.  Not the usual 20:1 ratio typically seen with pre-renal / ATN; however, did have similar AKI presentation just last month with BUN 27 and Creat 2.0 that seemed to improve with IVF 4. Therefore trying IVF: got 3293ml of LR in ED and NS at 75 thereafter 5. Strict intake and output 6. No obstruction seen on CT 7. Repeat CMP in AM 4. Possible Sepsis - 1. Vs just hypothermia due to heat not being on at home. 2. Sepsis pathway 3. Serial lactates 4. IVF as above 5. Will continue empiric cefepime / flagyl / vanc for the moment 6. UA neg 7. CXR neg 8. CT abd/pelvis: ascites 9. Will have IR do diagnostic paracentesis to r/o SBP in AM 5. Cirrhosis - 1. SBP in AM 2. INR 1.7 6. Prior EtOH abuse - 1. EtOH today neg 2. Not drinking anymore per admission h/p last month 3. Will just put on thiamine for the moment 7. Anemia - 1. Acute on chronic - unclear source 2. Repeat CBC in AM 3. No reported stigmata of GIB at this time (though did come in covered in [facility] apparently per EMS).  DVT prophylaxis: SCDs - given elevated INR, anemia Code Status: Full Family Communication: No family in room Disposition Plan: Home after mental status improved Consults called: None Admission status: Admit to inpatient  Severity of Illness: The appropriate patient status for this patient is INPATIENT. Inpatient status is judged to be reasonable and necessary in order to provide the required intensity of service to ensure the patient's safety. The patient's presenting symptoms, physical exam findings, and initial radiographic and laboratory data in the context of their chronic comorbidities is felt to place them at high risk for further clinical deterioration. Furthermore, it is not anticipated that the patient will be medically stable for discharge  from the hospital within 2 midnights of admission. The following factors support the patient status of inpatient.   IP status due to AMS, kidney failure with creat of 4.x.  Other issues as above.  * I certify that at the point of admission it is my clinical judgment that the patient will require inpatient hospital care spanning beyond 2 midnights from the point of admission due to high intensity of service, high risk for further  deterioration and high frequency of surveillance required.*    Kimyetta Flott M. DO [facility]  How to contact the [facility] Attending or Consulting provider [place] or covering provider during after hours [place], for this patient?  1. Check the care team in [facility] and look for a) attending/consulting TRH provider listed and b) the [facility] team listed 2. Log into [email or web address]  Amion Physician Scheduling and messaging for groups and whole hospitals  On call and physician scheduling software for group practices, residents, hospitalists and other medical providers for call, clinic, rotation and shift schedules. OnCall [employer] is a hospital-wide system for scheduling doctors and paging doctors on call. EasyPlot is for scientific plotting and data analysis.  [email or web address]  and use [place]'s universal password to access. If you do not have the password, please contact the hospital operator.  3. Locate the [facility] provider you are looking for under [facility] and page to a number that you can be directly reached. 4. If you still have difficulty reaching the provider, please page the [facility] (Director on Call) for the Hospitalists listed on amion for assistance.  02/25/2020, 1:29 AM

## 2020-02-25 NOTE — Progress Notes (Signed)
ASSUMPTION OF CARE NOTE   02/25/2020 11:44 AM  Darren Howard was seen and examined.  The H&P by the admitting provider, orders, imaging was reviewed.  Please see new orders.  Pt is slowly improving with treatments.  Can go to a telemetry bed.  Continue lactulose.  Will continue to follow.   Vitals:   02/25/20 1100 02/25/20 1130  BP: 107/75 (!) 144/72  Pulse: 88 97  Resp: 14 16  Temp:    SpO2: 100% 100%    Results for orders placed or performed during the hospital encounter of 02/24/20  Culture, blood (Routine X 2) w Reflex to ID Panel   Specimen: BLOOD RIGHT FOREARM  Result Value Ref Range   Specimen Description BLOOD RIGHT FOREARM    Special Requests      BOTTLES DRAWN AEROBIC AND ANAEROBIC Blood Culture results may not be optimal due to an inadequate volume of blood received in culture bottles   Culture      NO GROWTH < 12 HOURS Performed at [facility], [address] [place], [place] [address]    Report Status PENDING   Culture, blood (Routine X 2) w Reflex to ID Panel   Specimen: Left Antecubital; Blood  Result Value Ref Range   Specimen Description LEFT ANTECUBITAL    Special Requests      BOTTLES DRAWN AEROBIC AND ANAEROBIC Blood Culture results may not be optimal due to an inadequate volume of blood received in culture bottles   Culture      NO GROWTH < 12 HOURS Performed at [facility], [address], [place], [place] [address]    Report Status PENDING   Resp Panel by RT-PCR (Flu A&B, Covid) Nasopharyngeal Swab   Specimen: Nasopharyngeal Swab; Nasopharyngeal(NP) swabs in vial transport medium  Result Value Ref Range   SARS Coronavirus 2 by RT PCR NEGATIVE NEGATIVE   Influenza A by PCR NEGATIVE NEGATIVE   Influenza B by PCR NEGATIVE NEGATIVE  Comprehensive metabolic panel  Result Value Ref Range   Sodium 141 135 - 145 mmol/L   Potassium 4.3 3.5 - 5.1 mmol/L   Chloride 114 (H) 98 - 111 mmol/L   CO2 14 (L) 22 - 32 mmol/L   Glucose, Bld 93 70 - 99 mg/dL    BUN 53 (H) 8 - 23 mg/dL   Creatinine, Ser 4.22 (H) 0.61 - 1.24 mg/dL   Calcium 8.2 (L) 8.9 - 10.3 mg/dL   Total Protein 7.1 6.5 - 8.1 g/dL   Albumin 2.8 (L) 3.5 - 5.0 g/dL   AST 45 (H) 15 - 41 U/L   ALT 22 0 - 44 U/L   Alkaline Phosphatase 74 38 - 126 U/L   Total Bilirubin 4.3 (H) 0.3 - 1.2 mg/dL   GFR, Estimated 15 (L) >60 mL/min   Anion gap 13 5 - 15  Lipase, blood  Result Value Ref Range   Lipase 14 11 - 51 U/L  Ethanol  Result Value Ref Range   Alcohol, Ethyl (B) <10 <10 mg/dL  Urinalysis, Routine w reflex microscopic Urine, Clean Catch  Result Value Ref Range   Color, Urine YELLOW YELLOW   APPearance CLEAR CLEAR   Specific Gravity, Urine 1.013 1.005 - 1.030   pH 5.0 5.0 - 8.0   Glucose, UA NEGATIVE NEGATIVE mg/dL   Hgb urine dipstick NEGATIVE NEGATIVE   Bilirubin Urine NEGATIVE NEGATIVE   Ketones, ur 5 (A) NEGATIVE mg/dL   Protein, ur NEGATIVE NEGATIVE mg/dL   Nitrite NEGATIVE NEGATIVE  Leukocytes,Ua NEGATIVE NEGATIVE  Urine rapid drug screen (hosp performed)  Result Value Ref Range   Opiates NONE DETECTED NONE DETECTED   Cocaine NONE DETECTED NONE DETECTED   Benzodiazepines NONE DETECTED NONE DETECTED   Amphetamines NONE DETECTED NONE DETECTED   Tetrahydrocannabinol NONE DETECTED NONE DETECTED   Barbiturates NONE DETECTED NONE DETECTED  CK  Result Value Ref Range   Total CK 63 49 - 397 U/L  CBC with Differential  Result Value Ref Range   WBC 14.5 (H) 4.0 - 10.5 K/uL   RBC 2.49 (L) 4.22 - 5.81 MIL/uL   Hemoglobin 8.0 (L) 13.0 - 17.0 g/dL   HCT 24.0 (L) 39.0 - 52.0 %   MCV 96.4 80.0 - 100.0 fL   MCH 32.1 26.0 - 34.0 pg   MCHC 33.3 30.0 - 36.0 g/dL   RDW 17.4 (H) 11.5 - 15.5 %   Platelets 235 150 - 400 K/uL   nRBC 0.0 0.0 - 0.2 %   Neutrophils Relative % 80 %   Neutro Abs 11.4 (H) 1.7 - 7.7 K/uL   Lymphocytes Relative 10 %   Lymphs Abs 1.5 0.7 - 4.0 K/uL   Monocytes Relative 10 %   Monocytes Absolute 1.4 (H) 0.1 - 1.0 K/uL   Eosinophils Relative 0 %    Eosinophils Absolute 0.1 0.0 - 0.5 K/uL   Basophils Relative 0 %   Basophils Absolute 0.1 0.0 - 0.1 K/uL   Immature Granulocytes 0 %   Abs Immature Granulocytes 0.05 0.00 - 0.07 K/uL  Ammonia  Result Value Ref Range   Ammonia 143 (H) 9 - 35 umol/L  Lactic acid, plasma  Result Value Ref Range   Lactic Acid, Venous 3.1 (HH) 0.5 - 1.9 mmol/L  Lactic acid, plasma  Result Value Ref Range   Lactic Acid, Venous 2.2 (HH) 0.5 - 1.9 mmol/L  Procalcitonin  Result Value Ref Range   Procalcitonin 0.72 ng/mL  Protime-INR  Result Value Ref Range   Prothrombin Time 19.1 (H) 11.4 - 15.2 seconds   INR 1.7 (H) 0.8 - 1.2  Comprehensive metabolic panel  Result Value Ref Range   Sodium 144 135 - 145 mmol/L   Potassium 4.2 3.5 - 5.1 mmol/L   Chloride 118 (H) 98 - 111 mmol/L   CO2 13 (L) 22 - 32 mmol/L   Glucose, Bld 100 (H) 70 - 99 mg/dL   BUN 51 (H) 8 - 23 mg/dL   Creatinine, Ser 3.95 (H) 0.61 - 1.24 mg/dL   Calcium 8.0 (L) 8.9 - 10.3 mg/dL   Total Protein 6.9 6.5 - 8.1 g/dL   Albumin 2.7 (L) 3.5 - 5.0 g/dL   AST 51 (H) 15 - 41 U/L   ALT 27 0 - 44 U/L   Alkaline Phosphatase 69 38 - 126 U/L   Total Bilirubin 4.7 (H) 0.3 - 1.2 mg/dL   GFR, Estimated 16 (L) >60 mL/min   Anion gap 13 5 - 15  Ammonia  Result Value Ref Range   Ammonia 86 (H) 9 - 35 umol/L  CBC  Result Value Ref Range   WBC 12.6 (H) 4.0 - 10.5 K/uL   RBC 2.30 (L) 4.22 - 5.81 MIL/uL   Hemoglobin 7.6 (L) 13.0 - 17.0 g/dL   HCT 22.2 (L) 39.0 - 52.0 %   MCV 96.5 80.0 - 100.0 fL   MCH 33.0 26.0 - 34.0 pg   MCHC 34.2 30.0 - 36.0 g/dL   RDW 18.0 (H) 11.5 - 15.5 %  Platelets 192 150 - 400 K/uL   nRBC 0.0 0.0 - 0.2 %  Troponin I (High Sensitivity)  Result Value Ref Range   Troponin I (High Sensitivity) 27 (H) <18 ng/L  Troponin I (High Sensitivity)  Result Value Ref Range   Troponin I (High Sensitivity) 21 (H) <18 ng/L   C. Wynetta Emery, MD [facility]   02/24/2020  6:08 PM How to contact the [facility] Attending or Consulting  provider [place] or covering provider during after hours [place], for this patient?  1. Check the care team in [facility] and look for a) attending/consulting TRH provider listed and b) the [facility] team listed 2. Log into [email or web address] and use 's universal password to access. If you do not have the password, please contact the hospital operator. 3. Locate the [facility] provider you are looking for under [facility] and page to a number that you can be directly reached. 4. If you still have difficulty reaching the provider, please page the [facility] (Director on Call) for the Hospitalists listed on amion for assistance.

## 2020-02-25 NOTE — ED Provider Notes (Signed)
[facility] EMERGENCY DEPARTMENT Provider Note   CSN: [id number] Arrival date & time: 02/24/20  1758     History Chief Complaint  Patient presents with  . Altered Mental Status    Darren Howard is a 70 y.o. male.  Patient brought in by EMS.  For altered mental status.  When they arrived there was no here on in the house patient was in his own stool.  Patient cleaned redness and excoriation to buttocks.  Put on warm blankets.  Patient's eyes open in response to verbal stimuli with a yes however patient does not answer questions.  Abdomen was noted to be distended.  Patient has a past history of alcohol abuse.  And cirrhosis.  Altered mental status work-up was initiated.        Past Medical History:  Diagnosis Date  . Alcohol abuse   . Anemia   . DM2 (diabetes mellitus, type 2) ([place]) 02/13/2014  . Duodenal erosion   . Erosive gastropathy   . GI bleed 04/2018  . Glucose intolerance (impaired glucose tolerance)   . Hyperlipemia   . Hypertension   . Noncompliance     Patient Active Problem List   Diagnosis Date Noted  . Bleeding hemorrhoids   . Alcoholic cirrhosis of liver with ascites ([place])   . Abdominal ascites   . Generalized weakness   . Hepatitis   . AKI (acute kidney injury) ([place]) 01/18/2020  . Hepatorenal syndrome ([place]) 01/18/2020  . Prolonged QT interval 01/18/2020  . HLD (hyperlipidemia) 08/11/2019  . Abdominal pain   . Colitis, acute---??? infectious pancolitis 06/13/2019  . Urinary retention   . Diarrhea   . Abnormal CT scan, colon   . Pyelonephritis of right kidney 06/05/2019  . Pyelonephritis 06/05/2019  . Alcohol abuse   . Tobacco use   . Hematochezia 05/30/2018  . Hypokalemia 05/27/2018  . Hypomagnesemia 05/27/2018  . Hypocalcemia 05/27/2018  . Dizziness 05/13/2018  . Epistaxis 02/13/2014  . Hypertension 02/13/2014  . Normocytic anemia 02/13/2014  . DM2 (diabetes mellitus, type 2) ([place]) 02/13/2014  . Tachycardia 02/13/2014  . Hypertensive  urgency     Past Surgical History:  Procedure Laterality Date  . BIOPSY  05/17/2018   Procedure: BIOPSY;  Surgeon: Daneil Dolin, MD;  [facility];  Service: Endoscopy;;  . BIOPSY  06/15/2019   Procedure: BIOPSY;  Surgeon: Daneil Dolin, MD;  Location: [facility];  Service: Endoscopy;;  . BIOPSY  01/21/2020   Procedure: BIOPSY;  Surgeon: Harvel Quale, MD;  Location: [facility];  Service: Gastroenterology;;  duodenal polyp;  . CARDIAC CATHETERIZATION    . COLONOSCOPY N/A 06/15/2019   Procedure: COLONOSCOPY;  Surgeon: Daneil Dolin, MD;  Location: [facility];  Service: Endoscopy;  Laterality: N/A;  . COLONOSCOPY WITH PROPOFOL N/A 06/01/2018   inadequate colon prep, hemorrhoids on perianal exam, two 4-6 mm polyps at hepatic flexure, one 18 mm polyp at IC valve s/p piecemeal removal (tubular adenomas). was to have surveillance colonoscopy in 6 months but did not return to the office  . COLONOSCOPY WITH PROPOFOL N/A 01/21/2020   Procedure: COLONOSCOPY WITH PROPOFOL;  Surgeon: Harvel Quale, MD;  [facility];  Service: Gastroenterology;  Laterality: N/A;  . ESOPHAGOGASTRODUODENOSCOPY (EGD) WITH PROPOFOL N/A 05/17/2018   Dr. Gala Romney: normal esophagus, erosive gastropathy s/p biopsy, duodenal erosions, felt to be NSAID effect  . ESOPHAGOGASTRODUODENOSCOPY (EGD) WITH PROPOFOL N/A 01/21/2020   Procedure: ESOPHAGOGASTRODUODENOSCOPY (EGD) WITH PROPOFOL;  Surgeon: Jenetta Downer  Roney Marion, MD;  [facility];  Service: Gastroenterology;  Laterality: N/A;  . HERNIA REPAIR    . POLYPECTOMY  06/01/2018   Procedure: POLYPECTOMY;  Surgeon: Daneil Dolin, MD;  [facility];  Service: Endoscopy;;  colon       Family History  Problem Relation Age of Onset  . Congestive Heart Failure Mother   . Congestive Heart Failure Father   . Cancer Brother        unknown kind   . Colon cancer Neg Hx   . Colon polyps Neg Hx     Social  History   Tobacco Use  . Smoking status: Current Every Day Smoker    Packs/day: 0.50    Years: 30.00    Pack years: 15.00    Types: Cigarettes  . Smokeless tobacco: Never Used  . Tobacco comment: 2 cigarettes a day.  Vaping Use  . Vaping Use: Never used  Substance Use Topics  . Alcohol use: Not Currently    Alcohol/week: 2.0 standard drinks    Types: 2 Shots of liquor per week    Comment: denies  . Drug use: No    Home Medications Prior to Admission medications   Medication Sig Start Date End Date Taking? Authorizing Provider  carvedilol (COREG) 3.125 MG tablet Take 1 tablet (3.125 mg total) by mouth 2 (two) times daily with a meal. 01/26/20   Kathie Dike, MD  furosemide (LASIX) 20 MG tablet Take 1 tablet (20 mg total) by mouth daily. 01/27/20   Kathie Dike, MD  hydrocortisone (ANUSOL-HC) 25 MG suppository Place 1 suppository (25 mg total) rectally 2 (two) times daily. 01/26/20   Kathie Dike, MD  lactulose (CHRONULAC) 10 GM/15ML solution Take 45 mLs (30 g total) by mouth daily. 01/27/20   Kathie Dike, MD  midodrine (PROAMATINE) 2.5 MG tablet Take 3 tablets (7.5 mg total) by mouth 3 (three) times daily with meals. 01/26/20   Kathie Dike, MD  pantoprazole (PROTONIX) 40 MG tablet Take 1 tablet (40 mg total) by mouth daily for 30 days. 05/19/18 06/18/18  Manuella Ghazi, Pratik D, DO  saccharomyces boulardii (FLORASTOR) 250 MG capsule Take 1 capsule (250 mg total) by mouth 2 (two) times daily. Patient not taking: Reported on 01/18/2020 06/20/19   Barton Dubois, MD  spironolactone (ALDACTONE) 50 MG tablet Take 1 tablet (50 mg total) by mouth daily. 01/27/20   Kathie Dike, MD    Allergies    Patient has no known allergies.  Review of Systems   Review of Systems  Unable to perform ROS: Mental status change  Constitutional: Negative for chills and fever.  HENT: Negative for rhinorrhea and sore throat.   Eyes: Negative for visual disturbance.  Respiratory: Negative for cough and  shortness of breath.   Cardiovascular: Negative for chest pain and leg swelling.  Gastrointestinal: Negative for abdominal pain, diarrhea, nausea and vomiting.  Genitourinary: Negative for dysuria.  Musculoskeletal: Negative for back pain and neck pain.  Skin: Negative for rash.  Neurological: Negative for dizziness, light-headedness and headaches.  Hematological: Does not bruise/bleed easily.  Psychiatric/Behavioral: Negative for confusion.    Physical Exam Updated Vital Signs BP 112/77 (BP Location: Left Arm)   Pulse (!) 108   Temp (!) 97.1 F (36.2 C) (Oral)   Resp 17   Ht 1.778 m (5\' 10" )   Wt 68 kg   SpO2 98%   BMI 21.52 kg/m   Physical Exam Vitals and nursing note reviewed.  Constitutional:  Appearance: He is well-developed and well-nourished.  HENT:     Head: Normocephalic and atraumatic.     Mouth/Throat:     Mouth: Mucous membranes are dry.  Eyes:     Conjunctiva/sclera: Conjunctivae normal.  Cardiovascular:     Rate and Rhythm: Normal rate and regular rhythm.     Heart sounds: No murmur heard.   Pulmonary:     Effort: Pulmonary effort is normal. No respiratory distress.     Breath sounds: Normal breath sounds.  Abdominal:     General: There is distension.     Palpations: Abdomen is soft.     Tenderness: There is no abdominal tenderness.  Genitourinary:    Comments: Patient with swelling to bilateral inguinal hernia area. Musculoskeletal:        General: No edema.     Cervical back: Neck supple.  Skin:    General: Skin is warm and dry.  Neurological:     Mental Status: He is alert.     Comments: Patient will awake to verbal stimuli will answer yes.  But will not speak in sentences.  Does move all 4 extremities spontaneously  Psychiatric:        Mood and Affect: Mood and affect normal.     ED Results / Procedures / Treatments   Labs (all labs ordered are listed, but only abnormal results are displayed) Labs Reviewed  COMPREHENSIVE METABOLIC  PANEL - Abnormal; Notable for the following components:      Result Value   Chloride 114 (*)    CO2 14 (*)    BUN 53 (*)    Creatinine, Ser 4.22 (*)    Calcium 8.2 (*)    Albumin 2.8 (*)    AST 45 (*)    Total Bilirubin 4.3 (*)    GFR, Estimated 15 (*)    All other components within normal limits  URINALYSIS, ROUTINE W REFLEX MICROSCOPIC - Abnormal; Notable for the following components:   Ketones, ur 5 (*)    All other components within normal limits  CBC WITH DIFFERENTIAL/PLATELET - Abnormal; Notable for the following components:   WBC 14.5 (*)    RBC 2.49 (*)    Hemoglobin 8.0 (*)    HCT 24.0 (*)    RDW 17.4 (*)    Neutro Abs 11.4 (*)    Monocytes Absolute 1.4 (*)    All other components within normal limits  AMMONIA - Abnormal; Notable for the following components:   Ammonia 143 (*)    All other components within normal limits  LACTIC ACID, PLASMA - Abnormal; Notable for the following components:   Lactic Acid, Venous 3.1 (*)    All other components within normal limits  LACTIC ACID, PLASMA - Abnormal; Notable for the following components:   Lactic Acid, Venous 2.2 (*)    All other components within normal limits  TROPONIN I (HIGH SENSITIVITY) - Abnormal; Notable for the following components:   Troponin I (High Sensitivity) 21 (*)    All other components within normal limits  CULTURE, BLOOD (ROUTINE X 2)  CULTURE, BLOOD (ROUTINE X 2)  SARS CORONAVIRUS 2 (TAT 6-24 HRS)  LIPASE, BLOOD  ETHANOL  RAPID URINE DRUG SCREEN, HOSP PERFORMED  CK  TROPONIN I (HIGH SENSITIVITY)    EKG EKG Interpretation  Date/Time:  Friday February 24 2020 19:17:11 EST Ventricular Rate:  107 PR Interval:    QRS Duration: 163 QT Interval:  377 QTC Calculation: 503 R Axis:   -72 Text Interpretation: Sinus  tachycardia Paired ventricular premature complexes Nonspecific IVCD with LAD Inferior infarct, old Abnormal lateral Q waves Anterior infarct, old Interpretation limited secondary to  artifact Confirmed by Fredia Sorrow [phone]) on 02/24/2020 8:13:41 PM   Radiology CT Abdomen Pelvis Wo Contrast  Result Date: 02/24/2020 CLINICAL DATA:  70 year old male with altered mental status. Abdominal distension. EXAM: CT ABDOMEN AND PELVIS WITHOUT CONTRAST TECHNIQUE: Multidetector CT imaging of the abdomen and pelvis was performed following the standard protocol without IV contrast. COMPARISON:  CT of the pelvis dated 01/18/2020. FINDINGS: Evaluation of this exam is limited in the absence of intravenous contrast. Evaluation is also limited due to streak artifact caused by support wires. Lower chest: The visualized lung bases are clear. No intra-abdominal free air. Moderate ascites. Hepatobiliary: The liver is grossly unremarkable. No calcified gallstone. Pancreas: The pancreas is atrophic Spleen: Normal in size without focal abnormality. Adrenals/Urinary Tract: The adrenal glands unremarkable. There is no hydronephrosis or nephrolithiasis on either side. Right renal cysts measure up to 3 cm in the inferior pole the right kidney. The urinary bladder is minimally distended and grossly unremarkable Stomach/Bowel: Evaluation of the bowel is limited in the absence of oral contrast and due to ascites. There is no bowel obstruction. Vascular/Lymphatic: Mild aortoiliac atherosclerotic disease. The IVC is unremarkable. No portal venous gas. There is no adenopathy. Reproductive: The prostate and seminal vesicles are grossly unremarkable. Other: None Musculoskeletal: Degenerative changes of the spine. No acute osseous pathology. There is extension of ascitic fluid into the inguinal canals bilaterally. IMPRESSION: 1. Moderate ascites. 2. No hydronephrosis or nephrolithiasis. 3. No evidence of bowel obstruction. 4. Aortic Atherosclerosis (ICD10-I70.0). Electronically Signed   By: Anner Crete M.D.   On: 02/24/2020 23:35   CT Head Wo Contrast  Result Date: 02/24/2020 CLINICAL DATA:  70 year old male with  altered mental status. EXAM: CT HEAD WITHOUT CONTRAST TECHNIQUE: Contiguous axial images were obtained from the base of the skull through the vertex without intravenous contrast. COMPARISON:  Head CT dated 05/13/2018. FINDINGS: Evaluation of this exam is limited due to motion artifact. Brain: Mild age-related atrophy and chronic microvascular ischemic changes. Old bilateral basal ganglia lacunar infarcts noted. There is no acute intracranial hemorrhage. No mass effect midline shift no extra-axial fluid collection. Vascular: No hyperdense vessel or unexpected calcification. Skull: Normal. Negative for fracture or focal lesion. Sinuses/Orbits: The visualized paranasal sinuses are clear. Right mastoid effusion. The left mastoid air cells are clear. Cerumen noted in the right external auditory canal. Other: None IMPRESSION: 1. No acute intracranial pathology. 2. Age-related atrophy and chronic microvascular ischemic changes. Old bilateral basal ganglia lacunar infarcts. Electronically Signed   By: Anner Crete M.D.   On: 02/24/2020 21:14   DG Chest Port 1 View  Result Date: 02/24/2020 CLINICAL DATA:  Altered mental status. EXAM: PORTABLE CHEST 1 VIEW COMPARISON:  01/18/2020 FINDINGS: The heart size and mediastinal contours are within normal limits. Both lungs are clear. The visualized skeletal structures are unremarkable. IMPRESSION: No active disease. Electronically Signed   By: Marlaine Hind M.D.   On: 02/24/2020 19:41    Procedures Procedures (including critical care time)  CRITICAL CARE Performed by: Fredia Sorrow Total critical care time: 60 minutes Critical care time was exclusive of separately billable procedures and treating other patients. Critical care was necessary to treat or prevent imminent or life-threatening deterioration. Critical care was time spent personally by me on the following activities: development of treatment plan with patient and/or surrogate as well as nursing,  discussions with consultants, evaluation  of patient's response to treatment, examination of patient, obtaining history from patient or surrogate, ordering and performing treatments and interventions, ordering and review of laboratory studies, ordering and review of radiographic studies, pulse oximetry and re-evaluation of patient's condition.   Medications Ordered in ED Medications  0.9 %  sodium chloride infusion ( Intravenous Stopped 02/24/20 2206)  lactated ringers infusion (has no administration in time range)  vancomycin variable dose per unstable renal function (pharmacist dosing) (has no administration in time range)  ceFEPIme (MAXIPIME) 2 g in sodium chloride 0.9 % 100 mL IVPB (has no administration in time range)  sodium chloride 0.9 % bolus 1,000 mL (0 mLs Intravenous Stopped 02/24/20 2119)  ceFEPIme (MAXIPIME) 2 g in sodium chloride 0.9 % 100 mL IVPB (0 g Intravenous Stopped 02/24/20 2157)  sodium chloride 0.9 % bolus 1,000 mL (0 mLs Intravenous Stopped 02/24/20 2229)    And  sodium chloride 0.9 % bolus 1,000 mL (0 mLs Intravenous Stopped 02/24/20 2348)    And  sodium chloride 0.9 % bolus 250 mL (250 mLs Intravenous New Bag/Given 02/24/20 2348)  vancomycin (VANCOREADY) IVPB 1250 mg/250 mL (1,250 mg Intravenous New Bag/Given 02/24/20 2228)    ED Course  I have reviewed the triage vital signs and the nursing notes.  Pertinent labs & imaging results that were available during my care of the patient were reviewed by me and considered in my medical decision making (see chart for details).    MDM Rules/Calculators/A&P                             Patient is altered mental status most likely secondary to the ammonia level which is elevated.  Elevated bilirubin.  CT scan of the abdomen shows ascites.  But does not show any bowel obstruction.  The swelling in the groin area is due to fluid in the inguinal hernias from the ascites fluid.  Chest x-ray without acute findings.  Head CT without any  acute findings.  Patient CK not elevated.  Initial troponin elevated at 21.  Cultures pending.  Patient's initial vital signs questionable for sepsis.  But not ordered initially.  But then due to the hypothermia and some other concerns sepsis order set was initiated received broad-spectrum antibiotics.  Given fluids.  Which she probably needed either way.  Patient's initial lactic acid was 3.1.  But repeat was in the 2 range.  As I noted ammonia was markedly elevated at 143 most likely the patient's cause.  As stated above patient had CT had CT abdomen pelvis as well as chest x-ray.  Blood cultures are pending.  Most likely not Covid infection.  Patient CK was not elevated.  Discussed with hospitalist who will admit.  Patient will need lactulose.  Final Clinical Impression(s) / ED Diagnoses Final diagnoses:  Altered mental status, unspecified altered mental status type  Hyperammonemia [facility])    Rx / DC Orders ED Discharge Orders    None       Fredia Sorrow, MD 02/25/20 0020

## 2020-02-25 NOTE — ED Notes (Signed)
Hospitalist at bedside 

## 2020-02-26 DIAGNOSIS — K7290 Hepatic failure, unspecified without coma: Secondary | ICD-10-CM

## 2020-02-26 LAB — COMPREHENSIVE METABOLIC PANEL
ALT: 26 U/L (ref 0–44)
AST: 54 U/L — ABNORMAL HIGH (ref 15–41)
Albumin: 2.6 g/dL — ABNORMAL LOW (ref 3.5–5.0)
Alkaline Phosphatase: 60 U/L (ref 38–126)
Anion gap: 11 (ref 5–15)
BUN: 47 mg/dL — ABNORMAL HIGH (ref 8–23)
CO2: 14 mmol/L — ABNORMAL LOW (ref 22–32)
Calcium: 8.3 mg/dL — ABNORMAL LOW (ref 8.9–10.3)
Chloride: 120 mmol/L — ABNORMAL HIGH (ref 98–111)
Creatinine, Ser: 3.81 mg/dL — ABNORMAL HIGH (ref 0.61–1.24)
GFR, Estimated: 17 mL/min — ABNORMAL LOW (ref >60)
Glucose, Bld: 95 mg/dL (ref 70–99)
Potassium: 3.5 mmol/L (ref 3.5–5.1)
Sodium: 145 mmol/L (ref 135–145)
Total Bilirubin: 3.3 mg/dL — ABNORMAL HIGH (ref 0.3–1.2)
Total Protein: 6.6 g/dL (ref 6.5–8.1)

## 2020-02-26 LAB — CBC
HCT: 20.8 % — ABNORMAL LOW (ref 39.0–52.0)
Hemoglobin: 6.8 g/dL — CL (ref 13.0–17.0)
MCH: 31.9 pg (ref 26.0–34.0)
MCHC: 32.7 g/dL (ref 30.0–36.0)
MCV: 97.7 fL (ref 80.0–100.0)
Platelets: 156 10*3/uL (ref 150–400)
RBC: 2.13 MIL/uL — ABNORMAL LOW (ref 4.22–5.81)
RDW: 18.2 % — ABNORMAL HIGH (ref 11.5–15.5)
WBC: 8.9 10*3/uL (ref 4.0–10.5)
nRBC: 0 % (ref 0.0–0.2)

## 2020-02-26 LAB — MAGNESIUM: Magnesium: 1.5 mg/dL — ABNORMAL LOW (ref 1.7–2.4)

## 2020-02-26 LAB — VANCOMYCIN, RANDOM: Vancomycin Rm: 13

## 2020-02-26 LAB — PREPARE RBC (CROSSMATCH)

## 2020-02-26 LAB — SARS CORONAVIRUS 2 (TAT 6-24 HRS): SARS Coronavirus 2: NEGATIVE

## 2020-02-26 LAB — AMMONIA: Ammonia: 35 umol/L (ref 9–35)

## 2020-02-26 MED ORDER — SODIUM CHLORIDE 0.9% IV SOLUTION: Freq: Once | INTRAVENOUS | Status: DC

## 2020-02-26 MED ORDER — SODIUM BICARBONATE 8.4 % IV SOLN
INTRAVENOUS | Status: DC
  Filled 2020-02-26 (×7): qty 850

## 2020-02-26 MED ORDER — VANCOMYCIN HCL IN DEXTROSE 1-5 GM/200ML-% IV SOLN
1000.0000 mg | Freq: Once | INTRAVENOUS | Status: AC
  Administered 2020-02-26: 1000 mg via INTRAVENOUS
  Filled 2020-02-26: qty 200

## 2020-02-26 MED ORDER — SODIUM CHLORIDE 0.9 % IV SOLN: INTRAVENOUS | Status: DC

## 2020-02-26 NOTE — Progress Notes (Signed)
PROGRESS NOTE   Darren Howard  ZOX:[id number] DOB: Apr 02, 1950 DOA: 02/24/2020 PCP: [name], MD   Chief Complaint  Patient presents with  . Altered Mental Status   Brief Admission History:  70 year old male with history of alcohol abuse hypertension and liver cirrhosis presented to the emergency department with acute altered mental status changes.  He was brought by EMS and they found him living in a home with no heat and lying in his own excrement.  He was admitted with hepatic encephalopathy.  Assessment & Plan:   Principal Problem:   Acute encephalopathy Active Problems:   AKI (acute kidney injury) ([place])   Hepatorenal syndrome (HCC)   Alcoholic cirrhosis of liver with ascites (HCC)   Sepsis (HCC)   Hyperammonemia (HCC)   Hepatic encephalopathy ([place])  1. Hepatic encephalopathy-patient has responded well to the lactulose treatments and ammonia level is trending down.  His mentation is starting to improve.  He cannot go home without an evaluation by social services. 2. Hyperammonemia-improving with lactulose treatments. 3. AKI-improving with IV fluid hydration. 4. Liver cirrhosis-we will have him follow-up with GI clinic after discharge. 5. Anemia of chronic disease- likely down from hemodilution no active bleeding has been found.  Transfuse 1 unit PRBC recheck CBC in a.m. 6. Ascites- ultrasound paracentesis ordered with sample sent for fluid collection.  Do not suspect SBP at this time but will have fluid tested.  DVT prophylaxis: SCDs Code Status: Full Family Communication: No family available Disposition: To be determined after evaluation by social services  Status is: Inpatient  Remains inpatient appropriate because:IV treatments appropriate due to intensity of illness or inability to take PO and Inpatient level of care appropriate due to severity of illness  Dispo: The patient is from: Home              Anticipated d/c is to: TBD              Anticipated d/c date  is: 1 day              Patient currently is not medically stable to d/c.  Consultants:   TOC  Procedures:   Tentative US paracentesis  Antimicrobials:  Anti-infectives (From admission, onward)   Start     Dose/Rate Route Frequency Ordered Stop   02/26/20 2300  vancomycin (VANCOCIN) IVPB 1000 mg/200 mL premix  Status:  Discontinued        1,000 mg 200 mL/hr over 60 Minutes Intravenous  Once 02/25/20 0925 02/26/20 1114   02/26/20 1130  vancomycin (VANCOCIN) IVPB 1000 mg/200 mL premix        1,000 mg 200 mL/hr over 60 Minutes Intravenous  Once 02/26/20 1114     02/25/20 2100  ceFEPIme (MAXIPIME) 2 g in sodium chloride 0.9 % 100 mL IVPB        2 g 200 mL/hr over 30 Minutes Intravenous Every 24 hours 02/24/20 2145     02/25/20 0600  metroNIDAZOLE (FLAGYL) IVPB 500 mg        500 mg 100 mL/hr over 60 Minutes Intravenous Every 8 hours 02/25/20 0043     02/24/20 2200  vancomycin (VANCOREADY) IVPB 1250 mg/250 mL        1,250 mg 166.7 mL/hr over 90 Minutes Intravenous  Once 02/24/20 2141 02/25/20 0038   02/24/20 2142  vancomycin variable dose per unstable renal function (pharmacist dosing)         Does not apply See admin instructions 02/24/20 2142     02/24/20  2100  ceFEPIme (MAXIPIME) 2 g in sodium chloride 0.9 % 100 mL IVPB        2 g 200 mL/hr over 30 Minutes Intravenous  Once 02/24/20 2057 02/24/20 2157   02/24/20 2100  vancomycin (VANCOCIN) IVPB 1000 mg/200 mL premix  Status:  Discontinued        1,000 mg 200 mL/hr over 60 Minutes Intravenous  Once 02/24/20 2057 02/24/20 2139   02/24/20 2100  piperacillin-tazobactam (ZOSYN) IVPB 3.375 g  Status:  Discontinued        3.375 g 12.5 mL/hr over 240 Minutes Intravenous  Once 02/24/20 2057 02/25/20 3875        Subjective: Pt reports that he feels hungry and having some abdominal discomfort.    Objective: Vitals:   02/25/20 2033 02/25/20 2218 02/26/20 0203 02/26/20 0609  BP:  131/74 139/74 137/74  Pulse:  94 94 92  Resp:  16 16  18   Temp:  98.6 F (37 C) 98.4 F (36.9 C) 98.1 F (36.7 C)  TempSrc:  Oral Oral Oral  SpO2: 100% 100% 100% 100%  Weight:      Height:        Intake/Output Summary (Last 24 hours) at 02/26/2020 1320 Last data filed at 02/26/2020 0900 Gross per 24 hour  Intake 1010 ml  Output --  Net 1010 ml   Filed Weights   02/24/20 1812  Weight: 68 kg    Examination:  General exam: emaciated, poorly groomed, elderly, frail male, Appears calm and comfortable  Respiratory system: Clear to auscultation. Respiratory effort normal. Cardiovascular system: normal S1 & S2 heard. No JVD, murmurs, rubs, gallops or clicks. No pedal edema. Gastrointestinal system: Abdomen is distended, soft and mildly tender. No organomegaly or masses felt. Normal bowel sounds heard. Central nervous system: Alert and oriented. No focal neurological deficits. Extremities: Symmetric 5 x 5 power. Skin: No rashes, lesions or ulcers Psychiatry: Judgement and insight appear poor. Mood & affect appropriate.   Data Reviewed: I have personally reviewed following labs and imaging studies  CBC: Recent Labs  Lab 02/24/20 1940 02/25/20 0739 02/26/20 0940  WBC 14.5* 12.6* 8.9  NEUTROABS 11.4*  --   --   HGB 8.0* 7.6* 6.8*  HCT 24.0* 22.2* 20.8*  MCV 96.4 96.5 97.7  PLT 235 192 643    Basic Metabolic Panel: Recent Labs  Lab 02/24/20 1940 02/25/20 0500 02/26/20 0940  NA 141 144 145  K 4.3 4.2 3.5  CL 114* 118* 120*  CO2 14* 13* 14*  GLUCOSE 93 100* 95  BUN 53* 51* 47*  CREATININE 4.22* 3.95* 3.81*  CALCIUM 8.2* 8.0* 8.3*  MG  --   --  1.5*    GFR: Estimated Creatinine Clearance: 18.1 mL/min (A) (by C-G formula based on SCr of 3.81 mg/dL (H)).  Liver Function Tests: Recent Labs  Lab 02/24/20 1940 02/25/20 0500 02/26/20 0940  AST 45* 51* 54*  ALT 22 27 26   ALKPHOS 74 69 60  BILITOT 4.3* 4.7* 3.3*  PROT 7.1 6.9 6.6  ALBUMIN 2.8* 2.7* 2.6*    CBG: No results for input(s): GLUCAP in the last 168  hours.  Recent Results (from the past 240 hour(s))  Culture, blood (Routine X 2) w Reflex to ID Panel     Status: None (Preliminary result)   Collection Time: 02/24/20  7:40 PM   Specimen: BLOOD RIGHT FOREARM  Result Value Ref Range Status   Specimen Description BLOOD RIGHT FOREARM  Final   Special Requests  Final    BOTTLES DRAWN AEROBIC AND ANAEROBIC Blood Culture results may not be optimal due to an inadequate volume of blood received in culture bottles   Culture   Final    NO GROWTH 2 DAYS Performed at [facility], [address], [place], [place] [address]    Report Status PENDING  Incomplete  Culture, blood (Routine X 2) w Reflex to ID Panel     Status: None (Preliminary result)   Collection Time: 02/24/20  7:43 PM   Specimen: Left Antecubital; Blood  Result Value Ref Range Status   Specimen Description LEFT ANTECUBITAL  Final   Special Requests   Final    BOTTLES DRAWN AEROBIC AND ANAEROBIC Blood Culture results may not be optimal due to an inadequate volume of blood received in culture bottles   Culture   Final    NO GROWTH 2 DAYS Performed at [facility], [address] [place],  [address]    Report Status PENDING  Incomplete  SARS CORONAVIRUS 2 (TAT 6-24 HRS) Nasopharyngeal Nasopharyngeal Swab     Status: None   Collection Time: 02/25/20 12:19 AM   Specimen: Nasopharyngeal Swab  Result Value Ref Range Status   SARS Coronavirus 2 NEGATIVE NEGATIVE Final    Comment: (NOTE) SARS-CoV-2 target nucleic acids are NOT DETECTED.  The SARS-CoV-2 RNA is generally detectable in upper and lower respiratory specimens during the acute phase of infection. Negative results do not preclude SARS-CoV-2 infection, do not rule out co-infections with other pathogens, and should not be used as the sole basis for treatment or other patient management decisions. Negative results must be combined with clinical observations, patient history, and epidemiological information. The  expected result is Negative.  Fact Sheet for Patients: SugarRoll.be  Fact Sheet for Healthcare Providers: https://www.woods-mathews.com/  This test is not yet approved or cleared by the [place] FDA and  has been authorized for detection and/or diagnosis of SARS-CoV-2 by FDA under an Emergency Use Authorization (EUA). This EUA will remain  in effect (meaning this test can be used) for the duration of the COVID-19 declaration under Se ction 564(b)(1) of the Act, 21 U.S.C. section 360bbb-3(b)(1), unless the authorization is terminated or revoked sooner.  Performed at [facility], [place] [address], [place],  [address]   Resp Panel by RT-PCR (Flu A&B, Covid) Nasopharyngeal Swab     Status: None   Collection Time: 02/25/20  5:46 AM   Specimen: Nasopharyngeal Swab; Nasopharyngeal(NP) swabs in vial transport medium  Result Value Ref Range Status   SARS Coronavirus 2 by RT PCR NEGATIVE NEGATIVE Final    Comment: (NOTE) SARS-CoV-2 target nucleic acids are NOT DETECTED.  The SARS-CoV-2 RNA is generally detectable in upper respiratory specimens during the acute phase of infection. The lowest concentration of SARS-CoV-2 viral copies this assay can detect is 138 copies/mL. A negative result does not preclude SARS-Cov-2 infection and should not be used as the sole basis for treatment or other patient management decisions. A negative result may occur with  improper specimen collection/handling, submission of specimen other than nasopharyngeal swab, presence of viral mutation(s) within the areas targeted by this assay, and inadequate number of viral copies(<138 copies/mL). A negative result must be combined with clinical observations, patient history, and epidemiological information. The expected result is Negative.  Fact Sheet for Patients:  EntrepreneurPulse.com.au  Fact Sheet for Healthcare Providers:   IncredibleEmployment.be  This test is no t yet approved or cleared by the [place] and  has been authorized for detection and/or diagnosis of SARS-CoV-2 by FDA under an Emergency Use Authorization (EUA). This EUA will remain  in effect (meaning this test can be used) for the duration of the COVID-19 declaration under Section 564(b)(1) of the Act, 21 U.S.C.section 360bbb-3(b)(1), unless the authorization is terminated  or revoked sooner.       Influenza A by PCR NEGATIVE NEGATIVE Final   Influenza B by PCR NEGATIVE NEGATIVE Final    Comment: (NOTE) The Xpert Xpress SARS-CoV-2/FLU/RSV plus assay is intended as an aid in the diagnosis of influenza from Nasopharyngeal swab specimens and should not be used as a sole basis for treatment. Nasal washings and aspirates are unacceptable for Xpert Xpress SARS-CoV-2/FLU/RSV testing.  Fact Sheet for Patients: EntrepreneurPulse.com.au  Fact Sheet for Healthcare Providers: IncredibleEmployment.be  This test is not yet approved or cleared by the [place] FDA and has been authorized for detection and/or diagnosis of SARS-CoV-2 by FDA under an Emergency Use Authorization (EUA). This EUA will remain in effect (meaning this test can be used) for the duration of the COVID-19 declaration under Section 564(b)(1) of the Act, 21 U.S.C. section 360bbb-3(b)(1), unless the authorization is terminated or revoked.  Performed at [facility], [address], [place], [place] [address]      Radiology Studies: CT Abdomen Pelvis Wo Contrast  Result Date: 02/24/2020 CLINICAL DATA:  70 year old male with altered mental status. Abdominal distension. EXAM: CT ABDOMEN AND PELVIS WITHOUT CONTRAST TECHNIQUE: Multidetector CT imaging of the abdomen and pelvis was performed following the standard protocol without IV contrast. COMPARISON:  CT of the pelvis dated 01/18/2020. FINDINGS: Evaluation of this  exam is limited in the absence of intravenous contrast. Evaluation is also limited due to streak artifact caused by support wires. Lower chest: The visualized lung bases are clear. No intra-abdominal free air. Moderate ascites. Hepatobiliary: The liver is grossly unremarkable. No calcified gallstone. Pancreas: The pancreas is atrophic Spleen: Normal in size without focal abnormality. Adrenals/Urinary Tract: The adrenal glands unremarkable. There is no hydronephrosis or nephrolithiasis on either side. Right renal cysts measure up to 3 cm in the inferior pole the right kidney. The urinary bladder is minimally distended and grossly unremarkable Stomach/Bowel: Evaluation of the bowel is limited in the absence of oral contrast and due to ascites. There is no bowel obstruction. Vascular/Lymphatic: Mild aortoiliac atherosclerotic disease. The IVC is unremarkable. No portal venous gas. There is no adenopathy. Reproductive: The prostate and seminal vesicles are grossly unremarkable. Other: None Musculoskeletal: Degenerative changes of the spine. No acute osseous pathology. There is extension of ascitic fluid into the inguinal canals bilaterally. IMPRESSION: 1. Moderate ascites. 2. No hydronephrosis or nephrolithiasis. 3. No evidence of bowel obstruction. 4. Aortic Atherosclerosis (ICD10-I70.0). Electronically Signed   By: Anner Crete M.D.   On: 02/24/2020 23:35   CT Head Wo Contrast  Result Date: 02/24/2020 CLINICAL DATA:  70 year old male with altered mental status. EXAM: CT HEAD WITHOUT CONTRAST TECHNIQUE: Contiguous axial images were obtained from the base of the skull through the vertex without intravenous contrast. COMPARISON:  Head CT dated 05/13/2018. FINDINGS: Evaluation of this exam is limited due to motion artifact. Brain: Mild age-related atrophy and chronic microvascular ischemic changes. Old bilateral basal ganglia lacunar infarcts noted. There is no acute intracranial hemorrhage. No mass effect midline  shift no extra-axial fluid collection. Vascular: No hyperdense vessel or unexpected calcification. Skull: Normal. Negative for fracture or focal lesion. Sinuses/Orbits: The visualized paranasal sinuses are clear. Right mastoid effusion. The left mastoid  air cells are clear. Cerumen noted in the right external auditory canal. Other: None IMPRESSION: 1. No acute intracranial pathology. 2. Age-related atrophy and chronic microvascular ischemic changes. Old bilateral basal ganglia lacunar infarcts. Electronically Signed   By: Anner Crete M.D.   On: 02/24/2020 21:14   DG Chest Port 1 View  Result Date: 02/24/2020 CLINICAL DATA:  Altered mental status. EXAM: PORTABLE CHEST 1 VIEW COMPARISON:  01/18/2020 FINDINGS: The heart size and mediastinal contours are within normal limits. Both lungs are clear. The visualized skeletal structures are unremarkable. IMPRESSION: No active disease. Electronically Signed   By: Marlaine Hind M.D.   On: 02/24/2020 19:41   Scheduled Meds: . sodium chloride   Intravenous Once  . lactulose  30 g Oral TID  . lactulose  300 mL Rectal Once  . thiamine injection  100 mg Intravenous Daily  . vancomycin variable dose per unstable renal function (pharmacist dosing)   Does not apply See admin instructions   Continuous Infusions: . sodium chloride 35 mL/hr at 02/26/20 1027  . ceFEPime (MAXIPIME) IV 2 g (02/25/20 2121)  . metronidazole 500 mg (02/26/20 0636)  . vancomycin 1,000 mg (02/26/20 1305)     LOS: 1 day   Time spent: 36 mins  Dima Ferrufino Wynetta Emery, MD How to contact the [facility] Attending or Consulting provider [place] or covering provider during after hours [place], for this patient?  1. Check the care team in [facility] and look for a) attending/consulting TRH provider listed and b) the [facility] team listed 2. Log into [email or web address] and use [place]'s universal password to access. If you do not have the password, please contact the hospital operator. 3. Locate the [facility] provider you  are looking for under [facility] and page to a number that you can be directly reached. 4. If you still have difficulty reaching the provider, please page the [facility] (Director on Call) for the Hospitalists listed on amion for assistance.  02/26/2020, 1:20 PM

## 2020-02-26 NOTE — Progress Notes (Signed)
Pharmacy Antibiotic Note  Darren Howard is a 70 y.o. male admitted on 02/24/2020 with AMS and AKI.  Pharmacy has been consulted for Vancomycin and Cefepime dosing for r/o sepsis.  SCr 3.81 (baseline 1.21 Jan 2020) CrCl 18.57ml/min, weight 68kg  Vancomycin random after 1250mg  1/7@2230  was 13 today 1/9@ 0940 Vancomycin Random 1/11 afternoon will be approx 26 hours    Plan: Vanc 1000mg  x 1 pulse dose at 1200 Cefepime 2gm IV q24hrs  Height: 5\' 10"  (177.8 cm) Weight: 68 kg (150 lb) IBW/kg (Calculated) : 73  Temp (24hrs), Avg:98.2 F (36.8 C), Min:97.5 F (36.4 C), Max:98.6 F (37 C)  Recent Labs  Lab 02/24/20 1940 02/24/20 2230 02/25/20 0500 02/25/20 0739 02/26/20 0940  WBC 14.5*  --   --  12.6* 8.9  CREATININE 4.22*  --  3.95*  --  3.81*  LATICACIDVEN 3.1* 2.2*  --   --   --   VANCORANDOM  --   --   --   --  13    Estimated Creatinine Clearance: 18.1 mL/min (A) (by C-G formula based on SCr of 3.81 mg/dL (H)).    No Known Allergies  Antimicrobials this admission: Vanc 1/7 >> Cefepime 1/7 >> Zosyn 1/7 x 1  Dose adjustments this admission:   Microbiology results: pending  Thank you for allowing pharmacy to be a part of this patient's care.  Thomasenia Sales, PharmD, MBA, BCGP Clinical Pharmacist   02/26/2020 11:15 AM

## 2020-02-27 ENCOUNTER — Inpatient Hospital Stay (HOSPITAL_COMMUNITY): Payer: PPO

## 2020-02-27 ENCOUNTER — Encounter (HOSPITAL_COMMUNITY): Payer: Self-pay | Admitting: Family Medicine

## 2020-02-27 LAB — GRAM STAIN

## 2020-02-27 LAB — PROTEIN, PLEURAL OR PERITONEAL FLUID: Total protein, fluid: <3 g/dL

## 2020-02-27 LAB — COMPREHENSIVE METABOLIC PANEL
ALT: 23 U/L (ref 0–44)
AST: 41 U/L (ref 15–41)
Albumin: 2.4 g/dL — ABNORMAL LOW (ref 3.5–5.0)
Alkaline Phosphatase: 54 U/L (ref 38–126)
Anion gap: 11 (ref 5–15)
BUN: 42 mg/dL — ABNORMAL HIGH (ref 8–23)
CO2: 14 mmol/L — ABNORMAL LOW (ref 22–32)
Calcium: 7.7 mg/dL — ABNORMAL LOW (ref 8.9–10.3)
Chloride: 115 mmol/L — ABNORMAL HIGH (ref 98–111)
Creatinine, Ser: 3.81 mg/dL — ABNORMAL HIGH (ref 0.61–1.24)
GFR, Estimated: 17 mL/min — ABNORMAL LOW (ref >60)
Glucose, Bld: 140 mg/dL — ABNORMAL HIGH (ref 70–99)
Potassium: 3.3 mmol/L — ABNORMAL LOW (ref 3.5–5.1)
Sodium: 140 mmol/L (ref 135–145)
Total Bilirubin: 2.4 mg/dL — ABNORMAL HIGH (ref 0.3–1.2)
Total Protein: 6.2 g/dL — ABNORMAL LOW (ref 6.5–8.1)

## 2020-02-27 LAB — GLUCOSE, PLEURAL OR PERITONEAL FLUID: Glucose, Fluid: 140 mg/dL

## 2020-02-27 LAB — BODY FLUID CELL COUNT WITH DIFFERENTIAL
Eos, Fluid: 0 %
Lymphs, Fluid: 10 %
Monocyte-Macrophage-Serous Fluid: 81 % (ref 50–90)
Neutrophil Count, Fluid: 9 % (ref 0–25)
Total Nucleated Cell Count, Fluid: 125 cu mm (ref 0–1000)

## 2020-02-27 LAB — CBC
HCT: 19.6 % — ABNORMAL LOW (ref 39.0–52.0)
Hemoglobin: 6.5 g/dL — CL (ref 13.0–17.0)
MCH: 32.2 pg (ref 26.0–34.0)
MCHC: 33.2 g/dL (ref 30.0–36.0)
MCV: 97 fL (ref 80.0–100.0)
Platelets: 129 10*3/uL — ABNORMAL LOW (ref 150–400)
RBC: 2.02 MIL/uL — ABNORMAL LOW (ref 4.22–5.81)
RDW: 17.9 % — ABNORMAL HIGH (ref 11.5–15.5)
WBC: 8.2 10*3/uL (ref 4.0–10.5)
nRBC: 0 % (ref 0.0–0.2)

## 2020-02-27 LAB — AMMONIA: Ammonia: 55 umol/L — ABNORMAL HIGH (ref 9–35)

## 2020-02-27 LAB — MAGNESIUM: Magnesium: 1.6 mg/dL — ABNORMAL LOW (ref 1.7–2.4)

## 2020-02-27 LAB — PREPARE RBC (CROSSMATCH)

## 2020-02-27 MED ORDER — CHLORHEXIDINE GLUCONATE CLOTH 2 % EX PADS
6.0000 | MEDICATED_PAD | Freq: Every day | CUTANEOUS | Status: DC
  Administered 2020-02-27 – 2020-03-01 (×3): 6 via TOPICAL

## 2020-02-27 MED ORDER — PANTOPRAZOLE SODIUM 40 MG IV SOLR
40.0000 mg | INTRAVENOUS | Status: DC
  Administered 2020-02-27: 40 mg via INTRAVENOUS
  Filled 2020-02-27 (×2): qty 40

## 2020-02-27 MED ORDER — MAGNESIUM SULFATE 4 GM/100ML IV SOLN
4.0000 g | Freq: Once | INTRAVENOUS | Status: AC
  Administered 2020-02-27: 4 g via INTRAVENOUS
  Filled 2020-02-27: qty 100

## 2020-02-27 MED ORDER — POTASSIUM CHLORIDE CRYS ER 20 MEQ PO TBCR
40.0000 meq | EXTENDED_RELEASE_TABLET | Freq: Once | ORAL | Status: AC
  Administered 2020-02-27: 40 meq via ORAL
  Filled 2020-02-27: qty 2

## 2020-02-27 MED ORDER — DOXYCYCLINE HYCLATE 100 MG PO TABS
100.0000 mg | ORAL_TABLET | Freq: Two times a day (BID) | ORAL | Status: DC
  Administered 2020-02-27 – 2020-02-28 (×3): 100 mg via ORAL
  Filled 2020-02-27 (×3): qty 1

## 2020-02-27 NOTE — Progress Notes (Signed)
CRITICAL VALUE ALERT  Critical Value:  Hgb 6.5  Date & Time Notied:  02/27/20 0917  Provider Notified: Dr. Wynetta Emery  Orders Received/Actions taken: patient awaiting central line placement for IV access and transfusion

## 2020-02-27 NOTE — Procedures (Signed)
PreOperative Dx: Cirrhosis, ascites Postoperative Dx: Cirrhosis, ascites Procedure:   US guided paracentesis Radiologist:  Rosco Harriott Anesthesia:  10 ml of1% lidocaine Specimen:  6.2 L of yellow ascitic fluid EBL:   < 1 ml Complications: None  

## 2020-02-27 NOTE — Sedation Documentation (Signed)
Patient tolerated right sided paracentesis procedure well today and 6.2 Liters of clear yellow fluid removed with labs collected and sent for processing. PT vital signs remained WNL during and after procedure and he denied any complaints. PT verbalized understanding of post procedure instructions. Transporter took pt via stretcher back to inpatient room at this time.

## 2020-02-27 NOTE — TOC Initial Note (Signed)
Transition of Care [facility]) - Initial/Assessment Note    Patient Details  Name: Darren Howard MRN: [id number] Date of Birth: 20-Jan-1951  Transition of Care [facility]) CM/SW Contact:    Iona Beard, [place] Phone Number: 02/27/2020, 7:05 PM  Clinical Narrative:                 Pt admitted due to acute encephalopathy. Pt is high risk for readmission. CSW spoke with pts son Darren Howard to complete assessment. Pt lives alone currently. Pt is able to get dressed independently though his son is unsure of his ability to bathe. Pt can drive but does not much currently. Per pts son his grandmother will come by and check on the pt when she can. Pt had been active with [facility] for HHPT but pt was d/c due to not following through per son. Pts son states he thinks pt is able but just doesn't do it. CSW spoke with pts son about interest in SNF is PT recommends. Pt son is interested but would like TOC to update on PT evaluation before sending referral. Pts son does not want pt to go home alone and states pt can come to his home if he needs to however he would prefer he be somewhere where someone would be around all the time due to [place] working first shift.   [name] asked what will happen if pt does not progress during SNF stay if he goes. CSW informed TOC will speak more about this with him. CSW informed [name] that a referral could be made to the financial counselor to see if pt would be eligible for Medicaid if interested in the event that pt would need long term care. [name] is agreeable. CSW to make referral to financial counselor. TOC to follow for PT recommendation.   Expected Discharge Plan: Skilled Nursing Facility Barriers to Discharge: Continued Medical Work up   Patient Goals and CMS Choice Patient states their goals for this hospitalization and ongoing recovery are:: Go to SNF if PT recommends CMS Medicare.gov Compare Post Acute Care list provided to:: Patient Represenative (must  comment) Choice offered to / list presented to : Adult Children  Expected Discharge Plan and Services Expected Discharge Plan: [place] In-house Referral: NA Discharge Planning Services: NA Post Acute Care Choice: [place] Living arrangements for the past 2 months: Single Family Home                 DME Arranged: N/A DME Agency: NA                  Prior Living Arrangements/Services Living arrangements for the past 2 months: Single Family Home Lives with:: Self Patient language and need for interpreter reviewed:: Yes Do you feel safe going back to the place where you live?: Yes      Need for Family Participation in Patient Care: Yes (Comment) Care giver support system in place?: Yes (comment) Current home services: DME Criminal Activity/Legal Involvement Pertinent to Current Situation/Hospitalization: No - Comment as needed  Activities of Daily Living Home Assistive Devices/Equipment: None ADL Screening (condition at time of admission) Does the patient have difficulty walking or climbing stairs?: Yes Weakness of Legs: Both Weakness of Arms/Hands: Both  Permission Sought/Granted                  Emotional Assessment Appearance:: Appears older than stated age Attitude/Demeanor/Rapport: Unable to Assess Affect (typically observed): Unable to Assess Orientation: : Oriented to Self,Oriented to  Place Alcohol / Substance Use: Not Applicable Psych Involvement: No (comment)  Admission diagnosis:  Hepatic encephalopathy (HCC) [K72.90] Hyperammonemia ([place]) [E72.20] Acute encephalopathy [G93.40] Ascites [R18.8] Altered mental status, unspecified altered mental status type [R41.82] Patient Active Problem List   Diagnosis Date Noted  . Acute encephalopathy 02/25/2020  . Sepsis ([place]) 02/25/2020  . Hyperammonemia ([place]) 02/25/2020  . Hepatic encephalopathy ([place]) 02/25/2020  . Bleeding hemorrhoids   . Alcoholic cirrhosis of liver with  ascites ([place])   . Abdominal ascites   . Generalized weakness   . Hepatitis   . AKI (acute kidney injury) ([place]) 01/18/2020  . Hepatorenal syndrome ([place]) 01/18/2020  . Prolonged QT interval 01/18/2020  . HLD (hyperlipidemia) 08/11/2019  . Abdominal pain   . Colitis, acute---??? infectious pancolitis 06/13/2019  . Urinary retention   . Diarrhea   . Abnormal CT scan, colon   . Pyelonephritis of right kidney 06/05/2019  . Pyelonephritis 06/05/2019  . Alcohol abuse   . Tobacco use   . Hematochezia 05/30/2018  . Hypokalemia 05/27/2018  . Hypomagnesemia 05/27/2018  . Hypocalcemia 05/27/2018  . Dizziness 05/13/2018  . Epistaxis 02/13/2014  . Hypertension 02/13/2014  . Normocytic anemia 02/13/2014  . DM2 (diabetes mellitus, type 2) ([place]) 02/13/2014  . Tachycardia 02/13/2014  . Hypertensive urgency    PCP:  [name], MD Pharmacy:   [place], [place] [place] [address] Phone: [phone] Fax: [phone]     Social Determinants of Health (SDOH) Interventions    Readmission Risk Interventions Readmission Risk Prevention Plan 02/27/2020 01/26/2020 01/23/2020  Post Dischage Appt - - -  Medication Screening - - -  Transportation Screening Complete Complete Complete  PCP or Specialist Appt within 5-7 Days - - -  PCP or Specialist Appt within 3-5 Days - Complete Complete  Home Care Screening - - -  Medication Review (RN CM) - - -  [place] or Home Care Consult Complete Complete Complete  Social Work Consult for [place] Planning/Counseling Complete Complete Complete  Palliative Care Screening Not Applicable Not Applicable Not Applicable  Medication Review Press photographer) Complete Complete Complete  Some recent data might be hidden

## 2020-02-27 NOTE — Progress Notes (Addendum)
PROGRESS NOTE   Darren Howard  WPY:[id number] DOB: 1950/07/19 DOA: 02/24/2020 PCP: [name], MD   Chief Complaint  Patient presents with   Altered Mental Status   Brief Admission History:  70 year old male with history of alcohol abuse hypertension and liver cirrhosis presented to the emergency department with acute altered mental status changes.  He was brought by EMS and they found him living in a home with no heat and lying in his own excrement.  He was admitted with hepatic encephalopathy.  Assessment & Plan:   Principal Problem:   Acute encephalopathy Active Problems:   AKI (acute kidney injury) ([place])   Hepatorenal syndrome (HCC)   Alcoholic cirrhosis of liver with ascites (HCC)   Sepsis (HCC)   Hyperammonemia (HCC)   Hepatic encephalopathy (HCC)  Hepatic encephalopathy-RESOLVED.  Patient has responded well to the lactulose treatments and ammonia level is trending down.  His mentation is improved.  He cannot go home without an evaluation by social services as he was living in squalor. Hyperammonemia-improving with lactulose treatments. AKI-improving with IV fluid hydration. Metabolic acidosis - started bicarbonate infusion.  Liver cirrhosis-we will have him follow-up with GI clinic after discharge. Hypokalemia /Hypomagnesemia -IV replacement given.  Anemia of chronic disease- likely down from hemodilution no active bleeding has been found.  Transfuse 1 unit PRBC recheck CBC in a.m. Ascites- ultrasound paracentesis ordered with sample sent for fluid collection.  SBP ruled out.  DVT prophylaxis: SCDs Code Status: Full Family Communication: No family available Disposition: To be determined after evaluation by social services  Status is: Inpatient  Remains inpatient appropriate because:IV treatments appropriate due to intensity of illness or inability to take PO and Inpatient level of care appropriate due to severity of illness  Dispo: The patient is from: Home               Anticipated d/c is to:  TBD              Anticipated d/c date is: 1 day              Patient currently is not medically stable to d/c.  Consultants:  TOC  Procedures:  Tentative US paracentesis  Antimicrobials:  Anti-infectives (From admission, onward)    Start     Dose/Rate Route Frequency Ordered Stop   02/27/20 1000  doxycycline (VIBRA-TABS) tablet 100 mg        100 mg Oral Every 12 hours 02/27/20 0748     02/26/20 2300  vancomycin (VANCOCIN) IVPB 1000 mg/200 mL premix  Status:  Discontinued        1,000 mg 200 mL/hr over 60 Minutes Intravenous  Once 02/25/20 0925 02/26/20 1114   02/26/20 1130  vancomycin (VANCOCIN) IVPB 1000 mg/200 mL premix        1,000 mg 200 mL/hr over 60 Minutes Intravenous  Once 02/26/20 1114 02/26/20 1405   02/25/20 2100  ceFEPIme (MAXIPIME) 2 g in sodium chloride 0.9 % 100 mL IVPB  Status:  Discontinued        2 g 200 mL/hr over 30 Minutes Intravenous Every 24 hours 02/24/20 2145 02/27/20 0748   02/25/20 0600  metroNIDAZOLE (FLAGYL) IVPB 500 mg  Status:  Discontinued        500 mg 100 mL/hr over 60 Minutes Intravenous Every 8 hours 02/25/20 0043 02/27/20 0843   02/24/20 2200  vancomycin (VANCOREADY) IVPB 1250 mg/250 mL        1,250 mg 166.7 mL/hr over 90 Minutes Intravenous  Once  02/24/20 2141 02/25/20 0038   02/24/20 2142  vancomycin variable dose per unstable renal function (pharmacist dosing)  Status:  Discontinued         Does not apply See admin instructions 02/24/20 2142 02/27/20 0748   02/24/20 2100  ceFEPIme (MAXIPIME) 2 g in sodium chloride 0.9 % 100 mL IVPB        2 g 200 mL/hr over 30 Minutes Intravenous  Once 02/24/20 2057 02/24/20 2157   02/24/20 2100  vancomycin (VANCOCIN) IVPB 1000 mg/200 mL premix  Status:  Discontinued        1,000 mg 200 mL/hr over 60 Minutes Intravenous  Once 02/24/20 2057 02/24/20 2139   02/24/20 2100  piperacillin-tazobactam (ZOSYN) IVPB 3.375 g  Status:  Discontinued        3.375 g 12.5 mL/hr over 240  Minutes Intravenous  Once 02/24/20 2057 02/25/20 2706      Subjective: Pt having some abdominal pain and distension.   Objective: Vitals:   02/27/20 1031 02/27/20 1047 02/27/20 1125 02/27/20 1507  BP: 125/84 121/74 (!) 122/56 134/62  Pulse: 83 84 84 82  Resp: 18 18 18 17   Temp:   98.7 F (37.1 C) 98.1 F (36.7 C)  TempSrc:   Oral Oral  SpO2: 100% 100% 100% 100%  Weight:      Height:        Intake/Output Summary (Last 24 hours) at 02/27/2020 1735 Last data filed at 02/27/2020 1557 Gross per 24 hour  Intake 488.45 ml  Output --  Net 488.45 ml   Filed Weights   02/24/20 1812  Weight: 68 kg    Examination:  General exam: emaciated, poorly groomed, elderly, frail male, Appears calm and comfortable  Respiratory system: Clear to auscultation. Respiratory effort normal. Cardiovascular system: normal S1 & S2 heard. No JVD, murmurs, rubs, gallops or clicks. No pedal edema. Gastrointestinal system: Abdomen is distended, soft and mildly tender. No organomegaly or masses felt. Normal bowel sounds heard. Central nervous system: Alert and oriented. No focal neurological deficits. Extremities: Symmetric 5 x 5 power. Skin: No rashes, lesions or ulcers Psychiatry: Judgement and insight appear poor. Mood & affect appropriate.   Data Reviewed: I have personally reviewed following labs and imaging studies  CBC: Recent Labs  Lab 02/24/20 1940 02/25/20 0739 02/26/20 0940 02/27/20 0750  WBC 14.5* 12.6* 8.9 8.2  NEUTROABS 11.4*  --   --   --   HGB 8.0* 7.6* 6.8* 6.5*  HCT 24.0* 22.2* 20.8* 19.6*  MCV 96.4 96.5 97.7 97.0  PLT 235 192 156 129*    Basic Metabolic Panel: Recent Labs  Lab 02/24/20 1940 02/25/20 0500 02/26/20 0940 02/27/20 0750  NA 141 144 145 140  K 4.3 4.2 3.5 3.3*  CL 114* 118* 120* 115*  CO2 14* 13* 14* 14*  GLUCOSE 93 100* 95 140*  BUN 53* 51* 47* 42*  CREATININE 4.22* 3.95* 3.81* 3.81*  CALCIUM 8.2* 8.0* 8.3* 7.7*  MG  --   --  1.5* 1.6*     GFR: Estimated Creatinine Clearance: 18.1 mL/min (A) (by C-G formula based on SCr of 3.81 mg/dL (H)).  Liver Function Tests: Recent Labs  Lab 02/24/20 1940 02/25/20 0500 02/26/20 0940 02/27/20 0750  AST 45* 51* 54* 41  ALT 22 27 26 23   ALKPHOS 74 69 60 54  BILITOT 4.3* 4.7* 3.3* 2.4*  PROT 7.1 6.9 6.6 6.2*  ALBUMIN 2.8* 2.7* 2.6* 2.4*    CBG: No results for input(s): GLUCAP in the last  168 hours.  Recent Results (from the past 240 hour(s))  Culture, blood (Routine X 2) w Reflex to ID Panel     Status: None (Preliminary result)   Collection Time: 02/24/20  7:40 PM   Specimen: BLOOD RIGHT FOREARM  Result Value Ref Range Status   Specimen Description BLOOD RIGHT FOREARM  Final   Special Requests   Final    BOTTLES DRAWN AEROBIC AND ANAEROBIC Blood Culture results may not be optimal due to an inadequate volume of blood received in culture bottles   Culture   Final    NO GROWTH 3 DAYS Performed at [facility], [address], [place], [place] [address]    Report Status PENDING  Incomplete  Culture, blood (Routine X 2) w Reflex to ID Panel     Status: None (Preliminary result)   Collection Time: 02/24/20  7:43 PM   Specimen: Left Antecubital; Blood  Result Value Ref Range Status   Specimen Description LEFT ANTECUBITAL  Final   Special Requests   Final    BOTTLES DRAWN AEROBIC AND ANAEROBIC Blood Culture results may not be optimal due to an inadequate volume of blood received in culture bottles   Culture   Final    NO GROWTH 3 DAYS Performed at [facility], [address], [place], [place] [address]    Report Status PENDING  Incomplete  SARS CORONAVIRUS 2 (TAT 6-24 HRS) Nasopharyngeal Nasopharyngeal Swab     Status: None   Collection Time: 02/25/20 12:19 AM   Specimen: Nasopharyngeal Swab  Result Value Ref Range Status   SARS Coronavirus 2 NEGATIVE NEGATIVE Final    Comment: (NOTE) SARS-CoV-2 target nucleic acids are NOT DETECTED.  The SARS-CoV-2 RNA is generally  detectable in upper and lower respiratory specimens during the acute phase of infection. Negative results do not preclude SARS-CoV-2 infection, do not rule out co-infections with other pathogens, and should not be used as the sole basis for treatment or other patient management decisions. Negative results must be combined with clinical observations, patient history, and epidemiological information. The expected result is Negative.  Fact Sheet for Patients: SugarRoll.be  Fact Sheet for Healthcare Providers: https://www.woods-mathews.com/  This test is not yet approved or cleared by the [place] FDA and  has been authorized for detection and/or diagnosis of SARS-CoV-2 by FDA under an Emergency Use Authorization (EUA). This EUA will remain  in effect (meaning this test can be used) for the duration of the COVID-19 declaration under Se ction 564(b)(1) of the Act, 21 U.S.C. section 360bbb-3(b)(1), unless the authorization is terminated or revoked sooner.  Performed at [facility], [place] [address] [place], [place] [address]   Resp Panel by RT-PCR (Flu A&B, Covid) Nasopharyngeal Swab     Status: None   Collection Time: 02/25/20  5:46 AM   Specimen: Nasopharyngeal Swab; Nasopharyngeal(NP) swabs in vial transport medium  Result Value Ref Range Status   SARS Coronavirus 2 by RT PCR NEGATIVE NEGATIVE Final    Comment: (NOTE) SARS-CoV-2 target nucleic acids are NOT DETECTED.  The SARS-CoV-2 RNA is generally detectable in upper respiratory specimens during the acute phase of infection. The lowest concentration of SARS-CoV-2 viral copies this assay can detect is 138 copies/mL. A negative result does not preclude SARS-Cov-2 infection and should not be used as the sole basis for treatment or other patient management decisions. A negative result may occur with  improper specimen collection/handling, submission of specimen other than  nasopharyngeal swab, presence of viral mutation(s) within  the areas targeted by this assay, and inadequate number of viral copies(<138 copies/mL). A negative result must be combined with clinical observations, patient history, and epidemiological information. The expected result is Negative.  Fact Sheet for Patients:  EntrepreneurPulse.com.au  Fact Sheet for Healthcare Providers:  IncredibleEmployment.be  This test is no t yet approved or cleared by the [place] FDA and  has been authorized for detection and/or diagnosis of SARS-CoV-2 by FDA under an Emergency Use Authorization (EUA). This EUA will remain  in effect (meaning this test can be used) for the duration of the COVID-19 declaration under Section 564(b)(1) of the Act, 21 U.S.C.section 360bbb-3(b)(1), unless the authorization is terminated  or revoked sooner.       Influenza A by PCR NEGATIVE NEGATIVE Final   Influenza B by PCR NEGATIVE NEGATIVE Final    Comment: (NOTE) The Xpert Xpress SARS-CoV-2/FLU/RSV plus assay is intended as an aid in the diagnosis of influenza from Nasopharyngeal swab specimens and should not be used as a sole basis for treatment. Nasal washings and aspirates are unacceptable for Xpert Xpress SARS-CoV-2/FLU/RSV testing.  Fact Sheet for Patients: EntrepreneurPulse.com.au  Fact Sheet for Healthcare Providers: IncredibleEmployment.be  This test is not yet approved or cleared by the [place] FDA and has been authorized for detection and/or diagnosis of SARS-CoV-2 by FDA under an Emergency Use Authorization (EUA). This EUA will remain in effect (meaning this test can be used) for the duration of the COVID-19 declaration under Section 564(b)(1) of the Act, 21 U.S.C. section 360bbb-3(b)(1), unless the authorization is terminated or revoked.  Performed at [facility], [address] [place], [place] [address]    Culture, body fluid-bottle     Status: None (Preliminary result)   Collection Time: 02/27/20 10:30 AM   Specimen: Ascitic  Result Value Ref Range Status   Specimen Description ASCITIC  Final   Special Requests 10CC  Final   Culture   Final    NO GROWTH < 12 HOURS Performed at [facility], [address], [place], [place] [address]    Report Status PENDING  Incomplete  Gram stain     Status: None   Collection Time: 02/27/20 11:00 AM   Specimen: Ascitic  Result Value Ref Range Status   Specimen Description ASCITIC  Final   Special Requests NONE  Final   Gram Stain   Final    ASCITIES FLUID NO ORGANISMS SEEN PRIMARILY MONONUMCLEA WBCS PRESENT Performed at [facility], [address], [place], [place] [address]    Report Status 02/27/2020 FINAL  Final     Radiology Studies: US RENAL  Result Date: 02/27/2020 CLINICAL DATA:  Inpatient.  Chronic kidney disease. EXAM: RENAL / URINARY TRACT ULTRASOUND COMPLETE COMPARISON:  02/24/2020 CT abdomen/pelvis. FINDINGS: Right Kidney: Renal measurements: 11.3 x 4.8 x 5.3 cm = volume: 150 mL. Echogenic right renal parenchyma, normal thickness. No hydronephrosis. Simple 2.7 x 2.4 x 2.2 cm upper right renal cyst. Simple 3.4 x 3.3 x 4.0 cm lower right renal cyst. Left Kidney: Renal measurements: 10.1 x 4.6 x 4.6 cm = volume: 112 mL. Echogenic left renal parenchyma, normal thickness. No hydronephrosis. No left renal mass. Bladder: Appears normal for degree of bladder distention. Other: Large volume ascites incidentally noted. IMPRESSION: 1. No hydronephrosis. 2. Echogenic normal size kidneys, compatible with reported nonspecific chronic renal parenchymal disease. 3. Simple right renal cysts. 4. Normal bladder. 5. Incidental large volume ascites. Electronically Signed   By: Ilona Sorrel M.D.   On: 02/27/2020 10:52  US Paracentesis  Result Date: 02/27/2020 INDICATION: Cirrhosis, ascites EXAM: ULTRASOUND GUIDED DIAGNOSTIC AND THERAPEUTIC PARACENTESIS  MEDICATIONS: None COMPLICATIONS: None immediate PROCEDURE: Informed written consent was obtained from the patient after a discussion of the risks, benefits and alternatives to treatment. A timeout was performed prior to the initiation of the procedure. Initial ultrasound scanning demonstrates a large amount of ascites within the right lower abdominal quadrant. The right lower abdomen was prepped and draped in the usual sterile fashion. 1% lidocaine was used for local anesthesia. Following this, a 5 [place] Yueh catheter was introduced. An ultrasound image was saved for documentation purposes. The paracentesis was performed. The catheter was removed and a dressing was applied. The patient tolerated the procedure well without immediate post procedural complication. Patient received post-procedure intravenous albumin; see nursing notes for details. FINDINGS: A total of approximately 6.2 L of yellow ascitic fluid was removed. Samples were sent to the laboratory as requested by the clinical team. IMPRESSION: Successful ultrasound-guided paracentesis yielding 6.2 liters of peritoneal fluid. Electronically Signed   By: Lavonia Dana M.D.   On: 02/27/2020 11:40   DG Chest Portable 1 View  Result Date: 02/27/2020 CLINICAL DATA:  Placement EXAM: PORTABLE CHEST 1 VIEW COMPARISON:  02/24/2020 FINDINGS: The heart size and mediastinal contours are within normal limits. Interval placement of right neck vascular catheter, tip projecting near the superior cavoatrial junction. Both lungs are clear. The visualized skeletal structures are unremarkable. IMPRESSION: Interval placement of right neck vascular catheter, tip projecting near the superior cavoatrial junction. Electronically Signed   By: Eddie Candle M.D.   On: 02/27/2020 12:52   Scheduled Meds:  sodium chloride   Intravenous Once   sodium chloride   Intravenous Once   Chlorhexidine Gluconate Cloth  6 each Topical Daily   doxycycline  100 mg Oral Q12H   lactulose  30 g  Oral TID   lactulose  300 mL Rectal Once   pantoprazole (PROTONIX) IV  40 mg Intravenous Q24H   potassium chloride  40 mEq Oral Once   thiamine injection  100 mg Intravenous Daily   Continuous Infusions:  magnesium sulfate bolus IVPB     sodium bicarbonate (isotonic) 150 mEq in D5W 1000 mL infusion 50 mL/hr at 02/27/20 1557     LOS: 2 days   Time spent: 35 mins  Fionnuala Hemmerich Wynetta Emery, MD How to contact the [facility] Attending or Consulting provider [place] or covering provider during after hours [place], for this patient?  Check the care team in [facility] and look for a) attending/consulting TRH provider listed and b) the [facility] team listed Log into [email or web address] and use [place] universal password to access. If you do not have the password, please contact the hospital operator. Locate the [facility] provider you are looking for under [facility] and page to a number that you can be directly reached. If you still have difficulty reaching the provider, please page the [facility] (Director on Call) for the Hospitalists listed on amion for assistance.  02/27/2020, 5:35 PM

## 2020-02-27 NOTE — Progress Notes (Signed)
PT Cancellation Note  Patient Details Name: Darren Howard MRN: [id number] DOB: May 12, 1950   Cancelled Treatment:    Reason Eval/Treat Not Completed: Medical issues which prohibited therapy. HgB critical lab value of 6.5. Will defer PT evaluation at this time.   [name]. Hartnett-Rands, MS, PT Per [place] [phone] 02/27/2020, 10:01 AM

## 2020-02-28 ENCOUNTER — Encounter (HOSPITAL_COMMUNITY): Payer: Self-pay | Admitting: Family Medicine

## 2020-02-28 DIAGNOSIS — Z7189 Other specified counseling: Secondary | ICD-10-CM

## 2020-02-28 DIAGNOSIS — Z515 Encounter for palliative care: Secondary | ICD-10-CM

## 2020-02-28 LAB — COMPREHENSIVE METABOLIC PANEL
ALT: 18 U/L (ref 0–44)
AST: 32 U/L (ref 15–41)
Albumin: 2.1 g/dL — ABNORMAL LOW (ref 3.5–5.0)
Alkaline Phosphatase: 51 U/L (ref 38–126)
Anion gap: 9 (ref 5–15)
BUN: 38 mg/dL — ABNORMAL HIGH (ref 8–23)
CO2: 15 mmol/L — ABNORMAL LOW (ref 22–32)
Calcium: 7.5 mg/dL — ABNORMAL LOW (ref 8.9–10.3)
Chloride: 113 mmol/L — ABNORMAL HIGH (ref 98–111)
Creatinine, Ser: 3.59 mg/dL — ABNORMAL HIGH (ref 0.61–1.24)
GFR, Estimated: 18 mL/min — ABNORMAL LOW (ref >60)
Glucose, Bld: 193 mg/dL — ABNORMAL HIGH (ref 70–99)
Potassium: 3.3 mmol/L — ABNORMAL LOW (ref 3.5–5.1)
Sodium: 137 mmol/L (ref 135–145)
Total Bilirubin: 3.1 mg/dL — ABNORMAL HIGH (ref 0.3–1.2)
Total Protein: 5.7 g/dL — ABNORMAL LOW (ref 6.5–8.1)

## 2020-02-28 LAB — BPAM RBC
Blood Product Expiration Date: 202202102359
Blood Product Expiration Date: 202202132359
ISSUE DATE / TIME: 202201091605
ISSUE DATE / TIME: 202201101813
Unit Type and Rh: 1700
Unit Type and Rh: 5100

## 2020-02-28 LAB — CBC
HCT: 23.6 % — ABNORMAL LOW (ref 39.0–52.0)
Hemoglobin: 8.1 g/dL — ABNORMAL LOW (ref 13.0–17.0)
MCH: 31.6 pg (ref 26.0–34.0)
MCHC: 34.3 g/dL (ref 30.0–36.0)
MCV: 92.2 fL (ref 80.0–100.0)
Platelets: 113 10*3/uL — ABNORMAL LOW (ref 150–400)
RBC: 2.56 MIL/uL — ABNORMAL LOW (ref 4.22–5.81)
RDW: 17.4 % — ABNORMAL HIGH (ref 11.5–15.5)
WBC: 7.3 10*3/uL (ref 4.0–10.5)
nRBC: 0 % (ref 0.0–0.2)

## 2020-02-28 LAB — TYPE AND SCREEN
ABO/RH(D): B POS
Antibody Screen: NEGATIVE
Unit division: 0
Unit division: 0

## 2020-02-28 LAB — HEMOGLOBIN AND HEMATOCRIT, BLOOD
HCT: 24.3 % — ABNORMAL LOW (ref 39.0–52.0)
Hemoglobin: 8.1 g/dL — ABNORMAL LOW (ref 13.0–17.0)

## 2020-02-28 LAB — MAGNESIUM: Magnesium: 2.6 mg/dL — ABNORMAL HIGH (ref 1.7–2.4)

## 2020-02-28 LAB — AMMONIA: Ammonia: 65 umol/L — ABNORMAL HIGH (ref 9–35)

## 2020-02-28 LAB — PATHOLOGIST SMEAR REVIEW

## 2020-02-28 MED ORDER — POTASSIUM CHLORIDE CRYS ER 20 MEQ PO TBCR
40.0000 meq | EXTENDED_RELEASE_TABLET | Freq: Once | ORAL | Status: AC
  Administered 2020-02-28: 40 meq via ORAL
  Filled 2020-02-28: qty 2

## 2020-02-28 MED ORDER — INFLUENZA VAC A&B SA ADJ QUAD 0.5 ML IM PRSY: 0.5000 mL | PREFILLED_SYRINGE | INTRAMUSCULAR | Status: DC | PRN

## 2020-02-28 MED ORDER — SODIUM CHLORIDE 0.9% FLUSH
10.0000 mL | Freq: Two times a day (BID) | INTRAVENOUS | Status: DC
  Administered 2020-02-29 – 2020-03-01 (×2): 10 mL via INTRAVENOUS

## 2020-02-28 MED ORDER — THIAMINE HCL 100 MG PO TABS
100.0000 mg | ORAL_TABLET | Freq: Every day | ORAL | Status: DC
  Administered 2020-02-29 – 2020-03-01 (×2): 100 mg via ORAL
  Filled 2020-02-28 (×2): qty 1

## 2020-02-28 MED ORDER — PANTOPRAZOLE SODIUM 40 MG PO TBEC
40.0000 mg | DELAYED_RELEASE_TABLET | Freq: Every day | ORAL | Status: DC
  Administered 2020-02-28 – 2020-03-01 (×3): 40 mg via ORAL
  Filled 2020-02-28 (×3): qty 1

## 2020-02-28 NOTE — NC FL2 (Signed)
[place] LEVEL OF CARE SCREENING TOOL     IDENTIFICATION  Patient Name: Darren Howard Birthdate: 08/23/1950 Sex: male Admission Date (Current Location): 02/24/2020  [facility] and [place] Number:  [employer] and Address:  [place] [address] [place]      Provider Number: [id number]  Attending Physician Name and Address:  Murlean Iba, MD  Relative Name and Phone Number:       Current Level of Care: Hospital Recommended Level of Care: [place] Prior Approval Number:    Date Approved/Denied: 05/14/18 PASRR Number: [id number]  Discharge Plan: SNF    Current Diagnoses: Patient Active Problem List   Diagnosis Date Noted  . Acute encephalopathy 02/25/2020  . Sepsis ([place]) 02/25/2020  . Hyperammonemia ([place]) 02/25/2020  . Hepatic encephalopathy ([place]) 02/25/2020  . Bleeding hemorrhoids   . Alcoholic cirrhosis of liver with ascites ([place])   . Abdominal ascites   . Generalized weakness   . Hepatitis   . AKI (acute kidney injury) ([place]) 01/18/2020  . Hepatorenal syndrome ([place]) 01/18/2020  . Prolonged QT interval 01/18/2020  . HLD (hyperlipidemia) 08/11/2019  . Abdominal pain   . Colitis, acute---??? infectious pancolitis 06/13/2019  . Urinary retention   . Diarrhea   . Abnormal CT scan, colon   . Pyelonephritis of right kidney 06/05/2019  . Pyelonephritis 06/05/2019  . Alcohol abuse   . Tobacco use   . Hematochezia 05/30/2018  . Hypokalemia 05/27/2018  . Hypomagnesemia 05/27/2018  . Hypocalcemia 05/27/2018  . Dizziness 05/13/2018  . Epistaxis 02/13/2014  . Hypertension 02/13/2014  . Normocytic anemia 02/13/2014  . DM2 (diabetes mellitus, type 2) ([place]) 02/13/2014  . Tachycardia 02/13/2014  . Hypertensive urgency     Orientation RESPIRATION BLADDER Height & Weight     Self,Time,Situation,Place  Normal Incontinent Weight: 150 lb (68 kg) Height:  5\' 10"  (177.8 cm)  BEHAVIORAL  SYMPTOMS/MOOD NEUROLOGICAL BOWEL NUTRITION STATUS      Incontinent Diet (SOFT Room service appropriate? Yes; Fluid consistency: Thin)  AMBULATORY STATUS COMMUNICATION OF NEEDS Skin   Extensive Assist Verbally Normal                       Personal Care Assistance Level of Assistance  Bathing,Feeding,Dressing Bathing Assistance: Limited assistance Feeding assistance: Limited assistance Dressing Assistance: Limited assistance     Functional Limitations Info  Sight,Hearing,Speech Sight Info: Adequate Hearing Info: Adequate Speech Info: Adequate    SPECIAL CARE FACTORS FREQUENCY  PT (By licensed PT)     PT Frequency: 5x              Contractures Contractures Info: Not present    Additional Factors Info  Code Status,Allergies Code Status Info: Full Allergies Info: n/a           Current Medications (02/28/2020):  This is the current hospital active medication list Current Facility-Administered Medications  Medication Dose Route Frequency Provider Last Rate Last Admin  . 0.9 %  sodium chloride infusion (Manually program via Guardrails IV Fluids)   Intravenous Once Johnson, Clanford L, MD      . 0.9 %  sodium chloride infusion (Manually program via Guardrails IV Fluids)   Intravenous Once Johnson, Clanford L, MD      . Chlorhexidine Gluconate Cloth 2 % PADS 6 each  6 each Topical Daily Wynetta Emery, Clanford L, MD   6 each at 02/28/20 0831  . doxycycline (VIBRA-TABS) tablet 100 mg  100 mg  Oral Q12H Johnson, Clanford L, MD   100 mg at 02/28/20 0831  . influenza vaccine adjuvanted (FLUAD) injection 0.5 mL  0.5 mL Intramuscular Prior to discharge Johnson, Clanford L, MD      . lactulose ([place]) 10 GM/15ML solution 30 g  30 g Oral TID Etta Quill, DO   30 g at 02/28/20 0831  . ondansetron (ZOFRAN) tablet 4 mg  4 mg Oral Q6H PRN Etta Quill, DO       Or  . ondansetron [facility]) injection 4 mg  4 mg Intravenous Q6H PRN Etta Quill, DO   4 mg at 02/27/20 1548  .  pantoprazole (PROTONIX) injection 40 mg  40 mg Intravenous Q24H Johnson, Clanford L, MD   40 mg at 02/27/20 1547  . sodium bicarbonate 150 mEq in dextrose 5 % 1,000 mL infusion   Intravenous Continuous Wynetta Emery, Clanford L, MD 50 mL/hr at 02/27/20 1557 Infusion Verify at 02/27/20 1557  . thiamine (B-1) injection 100 mg  100 mg Intravenous Daily Jennette Kettle M, DO   100 mg at 02/28/20 2094     Discharge Medications: Please see discharge summary for a list of discharge medications.  Relevant Imaging Results:  Relevant Lab Results:   Additional Information Pt SSN: [id number]  Natasha Bence, LCSW

## 2020-02-28 NOTE — TOC Progression Note (Signed)
Transition of Care [facility]) - Progression Note    Patient Details  Name: Darren Howard MRN: [id number] Date of Birth: Nov 14, 1950  Transition of Care [facility]) CM/SW [place], LCSW Phone Number: 02/28/2020, 5:11 PM  Clinical Narrative:    CSW notified that patient's son agreeable to Local SNF referrals. CSW placed referral with [facility]. CSW also started auth for SNF and ambulance. TOC to follow.    Expected Discharge Plan: Skilled Nursing Facility Barriers to Discharge: Continued Medical Work up  Expected Discharge Plan and Services Expected Discharge Plan: [place] In-house Referral: NA Discharge Planning Services: NA Post Acute Care Choice: [place] Living arrangements for the past 2 months: Single Family Home                 DME Arranged: N/A DME Agency: NA                   Social Determinants of Health (SDOH) Interventions    Readmission Risk Interventions Readmission Risk Prevention Plan 02/27/2020 01/26/2020 01/23/2020  Post Dischage Appt - - -  Medication Screening - - -  Transportation Screening Complete Complete Complete  PCP or Specialist Appt within 5-7 Days - - -  PCP or Specialist Appt within 3-5 Days - Complete Complete  Home Care Screening - - -  Medication Review (RN CM) - - -  [place] or Home Care Consult Complete Complete Complete  Social Work Consult for [place] Planning/Counseling Complete Complete Complete  Palliative Care Screening Not Applicable Not Applicable Not Applicable  Medication Review Press photographer) Complete Complete Complete  Some recent data might be hidden

## 2020-02-28 NOTE — Consult Note (Signed)
Consultation Note Date: 02/28/2020   Patient Name: Darren Howard  DOB: October 20, 1950  MRN: [id number]  Age / Sex: 70 y.o., male  PCP: Darren Sites, MD Referring Physician: Murlean Iba, MD  Reason for Consultation: Establishing goals of care and Psychosocial/spiritual support  HPI/Patient Profile: 70 y.o. male  with past medical history of alcohol abuse, duodenal erosion, erosive gastropathy, GI bleed, DM 2, HTN/HLD, anemia, noncompliance admitted on 02/24/2020 with resolved hepatic encephalopathy, increased ammonia improving with lactulose, acute kidney injury.   Clinical Assessment and Goals of Care: I have reviewed medical records including EPIC notes, labs and imaging, received report from attending, examined the patient and met at bedside with Darren Howard to discuss diagnosis prognosis, Darren Howard, EOL wishes, disposition and options.  I introduced Palliative Medicine as specialized medical care for people living with serious illness. It focuses on providing relief from the symptoms and stress of a serious illness.   We discussed a brief life review of the patient.  Darren Howard worked at the [employer] for the 14 years previous to have his retirement.  Prior to that he worked at [employer].  He has 4 children.   As far as functional and nutritional status, he had been living independently, independent with ADLs/IADLs.  He tells me that he did have help with yard work.  We discussed current illness and what it means in the larger context of on-going co-morbidities.  Natural disease trajectory and expectations at EOL were discussed.  Advanced directives, concepts specific to code status, were considered and discussed.  At this point Darren Howard request attempted resuscitation, and is unable to set limits on life support.  Questions and concerns were addressed.  The family was  encouraged to call with questions or concerns.     HCPOA   NEXT OF KIN -Darren Howard tells me that his son, Darren Howard would be his surrogate decision maker.  He shares that he has 3 other children, but Darren Howard would be the primary.   SUMMARY OF RECOMMENDATIONS   Continue to treat the treatable Agreeable to short-term rehab Full scope/full code Would benefit from outpatient palliative   Code Status/Advance Care Planning:  Full code  Symptom Management:   Per hospitalist, no additional needs at this time.  Palliative Prophylaxis:   Frequent Pain Assessment and Oral Care  Additional Recommendations (Limitations, Scope, Preferences):  Full Scope Treatment  Psycho-social/Spiritual:   Desire for further Chaplaincy support:no  Additional Recommendations: Caregiving  Support/Resources  Prognosis:   Unable to determine, based on outcomes.  1 year or more would not be surprising if Darren Howard has proper care.  Discharge Planning: Agreeable to short-term rehab.  Would benefit from outpatient palliative      Primary Diagnoses: Present on Admission: . Alcoholic cirrhosis of liver with ascites ([name]) . Hepatorenal syndrome ([name]) . AKI (acute kidney injury) ([place]) . Acute encephalopathy . Sepsis ([name]) . Hyperammonemia ([name]) . Hepatic encephalopathy ([name])   I have reviewed the medical record, interviewed the patient and family,  and examined the patient. The following aspects are pertinent.  Past Medical History:  Diagnosis Date  . Alcohol abuse   . Anemia   . DM2 (diabetes mellitus, type 2) ([place]) 02/13/2014  . Duodenal erosion   . Erosive gastropathy   . GI bleed 04/2018  . Glucose intolerance (impaired glucose tolerance)   . Hyperlipemia   . Hypertension   . Noncompliance    Social History   Socioeconomic History  . Marital status: Widowed    Spouse name: Not on file  . Number of children: 4  . Years of education: Not on file  . Highest education  level: Not on file  Occupational History  . Occupation: retired    Comment: [employer]  Tobacco Use  . Smoking status: Current Every Day Smoker    Packs/day: 0.50    Years: 30.00    Pack years: 15.00    Types: Cigarettes  . Smokeless tobacco: Never Used  . Tobacco comment: 2 cigarettes a day.  Vaping Use  . Vaping Use: Never used  Substance and Sexual Activity  . Alcohol use: Not Currently    Alcohol/week: 2.0 standard drinks    Types: 2 Shots of liquor per week    Comment: denies  . Drug use: No  . Sexual activity: Not Currently  Other Topics Concern  . Not on file  Social History Narrative  . Not on file   Social Determinants of Health   Financial Resource Strain: Not on file  Food Insecurity: Not on file  Transportation Needs: Not on file  Physical Activity: Not on file  Stress: Not on file  Social Connections: Not on file   Family History  Problem Relation Age of Onset  . Congestive Heart Failure Mother   . Congestive Heart Failure Father   . Cancer Brother        unknown kind   . Colon cancer Neg Hx   . Colon polyps Neg Hx    Scheduled Meds: . sodium chloride   Intravenous Once  . sodium chloride   Intravenous Once  . Chlorhexidine Gluconate Cloth  6 each Topical Daily  . doxycycline  100 mg Oral Q12H  . lactulose  30 g Oral TID  . pantoprazole (PROTONIX) IV  40 mg Intravenous Q24H  . thiamine injection  100 mg Intravenous Daily   Continuous Infusions: . sodium bicarbonate (isotonic) 150 mEq in D5W 1000 mL infusion 50 mL/hr at 02/27/20 1557   PRN Meds:.influenza vaccine adjuvanted, ondansetron **OR** ondansetron (ZOFRAN) IV Medications Prior to Admission:  Prior to Admission medications   Medication Sig Start Date End Date Taking? Authorizing Provider  carvedilol (COREG) 3.125 MG tablet Take 1 tablet (3.125 mg total) by mouth 2 (two) times daily with a meal. 01/26/20   Kathie Dike, MD  furosemide (LASIX) 20 MG tablet Take 1 tablet (20 mg total) by  mouth daily. 01/27/20   Kathie Dike, MD  hydrocortisone (ANUSOL-HC) 25 MG suppository Place 1 suppository (25 mg total) rectally 2 (two) times daily. 01/26/20   Kathie Dike, MD  lactulose (CHRONULAC) 10 GM/15ML solution Take 45 mLs (30 g total) by mouth daily. 01/27/20   Kathie Dike, MD  midodrine (PROAMATINE) 2.5 MG tablet Take 3 tablets (7.5 mg total) by mouth 3 (three) times daily with meals. 01/26/20   Kathie Dike, MD  pantoprazole (PROTONIX) 40 MG tablet Take 1 tablet (40 mg total) by mouth daily for 30 days. 05/19/18 06/18/18  Manuella Ghazi, Pratik D, DO  saccharomyces boulardii Rosana Fret)  250 MG capsule Take 1 capsule (250 mg total) by mouth 2 (two) times daily. Patient not taking: Reported on 01/18/2020 06/20/19   Barton Dubois, MD  spironolactone (ALDACTONE) 50 MG tablet Take 1 tablet (50 mg total) by mouth daily. 01/27/20   Kathie Dike, MD   No Known Allergies Review of Systems  Unable to perform ROS: Age    Physical Exam Vitals and nursing note reviewed.  Constitutional:      Appearance: Normal appearance.  HENT:     Head: Normocephalic and atraumatic.     Mouth/Throat:     Mouth: Mucous membranes are moist.  Cardiovascular:     Rate and Rhythm: Normal rate.  Pulmonary:     Effort: Pulmonary effort is normal. No respiratory distress.  Abdominal:     General: Abdomen is flat.     Palpations: Abdomen is soft.  Skin:    General: Skin is warm and dry.  Neurological:     General: No focal deficit present.     Mental Status: He is alert.  Psychiatric:        Mood and Affect: Mood normal.        Behavior: Behavior normal.     Vital Signs: BP 125/75 (BP Location: Right Arm)   Pulse 85   Temp 98.6 F (37 C) (Oral)   Resp 18   Ht 5' 10"  (1.778 m)   Wt 68 kg   SpO2 100%   BMI 21.52 kg/m  Pain Scale: 0-10 POSS *See Group Information*: 1-Acceptable,Awake and alert Pain Score: 0-No pain   SpO2: SpO2: 100 % O2 Device:SpO2: 100 % O2 Flow Rate: .   IO:  Intake/output summary:   Intake/Output Summary (Last 24 hours) at 02/28/2020 1352 Last data filed at 02/28/2020 1300 Gross per 24 hour  Intake 1234.31 ml  Output 500 ml  Net 734.31 ml    LBM: Last BM Date: 02/26/20 Baseline Weight: Weight: 68 kg Most recent weight: Weight: 68 kg     Palliative Assessment/Data:   Flowsheet Rows   Flowsheet Row Most Recent Value  Intake Tab   Referral Department Hospitalist  Unit at Time of Referral Cardiac/Telemetry Unit  Palliative Care Primary Diagnosis Cardiac  Date Notified 02/24/20  Palliative Care Type New Palliative care  Reason for referral Clarify Goals of Care  Date of Admission 02/28/20  Date first seen by Palliative Care 02/28/20  # of days Palliative referral response time 4 Day(s)  # of days IP prior to Palliative referral -4  Clinical Assessment   Palliative Performance Scale Score 40%  Pain Max last 24 hours Not able to report  Pain Min Last 24 hours Not able to report  Dyspnea Max Last 24 Hours Not able to report  Dyspnea Min Last 24 hours Not able to report  Psychosocial & Spiritual Assessment   Palliative Care Outcomes       Time In: 1110 Time Out: 1200 Time Total: 50 minutes  Greater than 50%  of this time was spent counseling and coordinating care related to the above assessment and plan.  Signed by: Drue Novel, NP   Please contact Palliative Medicine Team phone at [phone] for questions and concerns.  For individual provider: See Shea Evans

## 2020-02-28 NOTE — Plan of Care (Signed)
  Problem: Acute Rehab PT Goals(only PT should resolve) Goal: Pt Will Go Supine/Side To Sit Outcome: Progressing Flowsheets (Taken 02/28/2020 1322) Pt will go Supine/Side to Sit: with supervision Goal: Patient Will Transfer Sit To/From Stand Outcome: Progressing Flowsheets (Taken 02/28/2020 1322) Patient will transfer sit to/from stand: with min guard assist Goal: Pt Will Transfer Bed To Chair/Chair To Bed Outcome: Progressing Flowsheets (Taken 02/28/2020 1322) Pt will Transfer Bed to Chair/Chair to Bed: min guard assist Goal: Pt Will Ambulate Outcome: Progressing Flowsheets (Taken 02/28/2020 1322) Pt will Ambulate:  50 feet  with minimal assist  with rolling walker    Tori Tonika Eden PT, DPT 02/28/20, 1:23 PM [phone]

## 2020-02-28 NOTE — Progress Notes (Signed)
PROGRESS NOTE   ZEBASTIAN CARICO  HDQ:[id number] DOB: 04-07-1950 DOA: 02/24/2020 PCP: [name], MD   Chief Complaint  Patient presents with  . Altered Mental Status   Brief Admission History:  70 year old male with history of alcohol abuse hypertension and liver cirrhosis presented to the emergency department with acute altered mental status changes.  He was brought by EMS and they found him living in a home with no heat and lying in his own excrement.  He was admitted with hepatic encephalopathy.  Assessment & Plan:   Principal Problem:   Acute encephalopathy Active Problems:   AKI (acute kidney injury) ([facility])   Hepatorenal syndrome (HCC)   Alcoholic cirrhosis of liver with ascites (HCC)   Sepsis (HCC)   Hyperammonemia (HCC)   Hepatic encephalopathy ([place])  1. Hepatic encephalopathy-RESOLVED.  Patient has responded well to the lactulose treatments and ammonia level is trending down.  His mentation is improved.  He cannot go home without an evaluation by social services as he was living in squalor. 2. Hyperammonemia-improving with lactulose treatments. 3. Stage 4 CKD - this appears to be progression of chronic kidney disease and not acute injury. 4. Metabolic acidosis - continue bicarbonate infusion.  5. Liver cirrhosis-we will have him follow-up with GI clinic after discharge. 6. Hypokalemia /Hypomagnesemia -IV replacement given.  7. Anemia of chronic disease- likely down from hemodilution no active bleeding has been found.  Transfused 1 unit PRBC.  8. Ascites- ultrasound paracentesis ordered with sample sent for fluid collection.  SBP ruled out.  DVT prophylaxis: SCDs Code Status: Full Family Communication:  Disposition: To be determined after evaluation by social services  Status is: Inpatient  Remains inpatient appropriate because:IV treatments appropriate due to intensity of illness or inability to take PO and Inpatient level of care appropriate due to severity of  illness  Dispo: The patient is from: Home              Anticipated d/c is to:  TBD              Anticipated d/c date is: 1 day              Patient currently is not medically stable to d/c.  Consultants:   TOC  Procedures:   Tentative US paracentesis  Antimicrobials:  Anti-infectives (From admission, onward)   Start     Dose/Rate Route Frequency Ordered Stop   02/27/20 1000  doxycycline (VIBRA-TABS) tablet 100 mg        100 mg Oral Every 12 hours 02/27/20 0748     02/26/20 2300  vancomycin (VANCOCIN) IVPB 1000 mg/200 mL premix  Status:  Discontinued        1,000 mg 200 mL/hr over 60 Minutes Intravenous  Once 02/25/20 0925 02/26/20 1114   02/26/20 1130  vancomycin (VANCOCIN) IVPB 1000 mg/200 mL premix        1,000 mg 200 mL/hr over 60 Minutes Intravenous  Once 02/26/20 1114 02/26/20 1405   02/25/20 2100  ceFEPIme (MAXIPIME) 2 g in sodium chloride 0.9 % 100 mL IVPB  Status:  Discontinued        2 g 200 mL/hr over 30 Minutes Intravenous Every 24 hours 02/24/20 2145 02/27/20 0748   02/25/20 0600  metroNIDAZOLE (FLAGYL) IVPB 500 mg  Status:  Discontinued        500 mg 100 mL/hr over 60 Minutes Intravenous Every 8 hours 02/25/20 0043 02/27/20 0843   02/24/20 2200  vancomycin (VANCOREADY) IVPB 1250 mg/250 mL  1,250 mg 166.7 mL/hr over 90 Minutes Intravenous  Once 02/24/20 2141 02/25/20 0038   02/24/20 2142  vancomycin variable dose per unstable renal function (pharmacist dosing)  Status:  Discontinued         Does not apply See admin instructions 02/24/20 2142 02/27/20 0748   02/24/20 2100  ceFEPIme (MAXIPIME) 2 g in sodium chloride 0.9 % 100 mL IVPB        2 g 200 mL/hr over 30 Minutes Intravenous  Once 02/24/20 2057 02/24/20 2157   02/24/20 2100  vancomycin (VANCOCIN) IVPB 1000 mg/200 mL premix  Status:  Discontinued        1,000 mg 200 mL/hr over 60 Minutes Intravenous  Once 02/24/20 2057 02/24/20 2139   02/24/20 2100  piperacillin-tazobactam (ZOSYN) IVPB 3.375 g  Status:   Discontinued        3.375 g 12.5 mL/hr over 240 Minutes Intravenous  Once 02/24/20 2057 02/25/20 7741     Subjective: Pt had projectile vomiting yesterday but today has been able to tolerate meals.   Objective: Vitals:   02/27/20 1835 02/27/20 2142 02/28/20 0602 02/28/20 1300  BP: 123/71 124/75 114/68 125/75  Pulse: 85 82 87 85  Resp: 16 17 18 18   Temp: 98.4 F (36.9 C) 98.4 F (36.9 C) 98.2 F (36.8 C) 98.6 F (37 C)  TempSrc: Oral Oral Oral Oral  SpO2: 100% 100% 100% 100%  Weight:      Height:        Intake/Output Summary (Last 24 hours) at 02/28/2020 1709 Last data filed at 02/28/2020 1600 Gross per 24 hour  Intake 1848.39 ml  Output 500 ml  Net 1348.39 ml   Filed Weights   02/24/20 1812  Weight: 68 kg    Examination:  General exam: emaciated, elderly, frail male, appears older than stated age, Appears calm and comfortable  Respiratory system: shallow BS bilateral. Respiratory effort normal. Cardiovascular system: normal S1 & S2 heard. No JVD, murmurs, rubs, gallops or clicks. No pedal edema. Gastrointestinal system: Abdomen is distended, soft and mildly tender. No organomegaly or masses felt. Normal bowel sounds heard. Central nervous system: Alert and oriented. No focal neurological deficits. Extremities: Symmetric 5 x 5 power. Skin: No rashes, lesions or ulcers Psychiatry: Judgement and insight appear poor. Mood & affect appropriate.   Data Reviewed: I have personally reviewed following labs and imaging studies  CBC: Recent Labs  Lab 02/24/20 1940 02/25/20 0739 02/26/20 0940 02/27/20 0750 02/27/20 2355 02/28/20 0923  WBC 14.5* 12.6* 8.9 8.2  --  7.3  NEUTROABS 11.4*  --   --   --   --   --   HGB 8.0* 7.6* 6.8* 6.5* 8.1* 8.1*  HCT 24.0* 22.2* 20.8* 19.6* 24.3* 23.6*  MCV 96.4 96.5 97.7 97.0  --  92.2  PLT 235 192 156 129*  --  113*    Basic Metabolic Panel: Recent Labs  Lab 02/24/20 1940 02/25/20 0500 02/26/20 0940 02/27/20 0750  02/28/20 0000  NA 141 144 145 140 137  K 4.3 4.2 3.5 3.3* 3.3*  CL 114* 118* 120* 115* 113*  CO2 14* 13* 14* 14* 15*  GLUCOSE 93 100* 95 140* 193*  BUN 53* 51* 47* 42* 38*  CREATININE 4.22* 3.95* 3.81* 3.81* 3.59*  CALCIUM 8.2* 8.0* 8.3* 7.7* 7.5*  MG  --   --  1.5* 1.6* 2.6*    GFR: Estimated Creatinine Clearance: 19.2 mL/min (A) (by C-G formula based on SCr of 3.59 mg/dL (H)).  Liver Function  Tests: Recent Labs  Lab 02/24/20 1940 02/25/20 0500 02/26/20 0940 02/27/20 0750 02/28/20 0000  AST 45* 51* 54* 41 32  ALT 22 27 26 23 18   ALKPHOS 74 69 60 54 51  BILITOT 4.3* 4.7* 3.3* 2.4* 3.1*  PROT 7.1 6.9 6.6 6.2* 5.7*  ALBUMIN 2.8* 2.7* 2.6* 2.4* 2.1*    CBG: No results for input(s): GLUCAP in the last 168 hours.  Recent Results (from the past 240 hour(s))  Culture, blood (Routine X 2) w Reflex to ID Panel     Status: None (Preliminary result)   Collection Time: 02/24/20  7:40 PM   Specimen: BLOOD RIGHT FOREARM  Result Value Ref Range Status   Specimen Description BLOOD RIGHT FOREARM  Final   Special Requests   Final    BOTTLES DRAWN AEROBIC AND ANAEROBIC Blood Culture results may not be optimal due to an inadequate volume of blood received in culture bottles   Culture   Final    NO GROWTH 4 DAYS Performed at [facility], [address] [place], [place] [address]    Report Status PENDING  Incomplete  Culture, blood (Routine X 2) w Reflex to ID Panel     Status: None (Preliminary result)   Collection Time: 02/24/20  7:43 PM   Specimen: Left Antecubital; Blood  Result Value Ref Range Status   Specimen Description LEFT ANTECUBITAL  Final   Special Requests   Final    BOTTLES DRAWN AEROBIC AND ANAEROBIC Blood Culture results may not be optimal due to an inadequate volume of blood received in culture bottles   Culture   Final    NO GROWTH 4 DAYS Performed at [facility], [address], [place], [place] [address]    Report Status PENDING  Incomplete  SARS  CORONAVIRUS 2 (TAT 6-24 HRS) Nasopharyngeal Nasopharyngeal Swab     Status: None   Collection Time: 02/25/20 12:19 AM   Specimen: Nasopharyngeal Swab  Result Value Ref Range Status   SARS Coronavirus 2 NEGATIVE NEGATIVE Final    Comment: (NOTE) SARS-CoV-2 target nucleic acids are NOT DETECTED.  The SARS-CoV-2 RNA is generally detectable in upper and lower respiratory specimens during the acute phase of infection. Negative results do not preclude SARS-CoV-2 infection, do not rule out co-infections with other pathogens, and should not be used as the sole basis for treatment or other patient management decisions. Negative results must be combined with clinical observations, patient history, and epidemiological information. The expected result is Negative.  Fact Sheet for Patients: SugarRoll.be  Fact Sheet for Healthcare Providers: https://www.woods-mathews.com/  This test is not yet approved or cleared by the [place] FDA and  has been authorized for detection and/or diagnosis of SARS-CoV-2 by FDA under an Emergency Use Authorization (EUA). This EUA will remain  in effect (meaning this test can be used) for the duration of the COVID-19 declaration under Se ction 564(b)(1) of the Act, 21 U.S.C. section 360bbb-3(b)(1), unless the authorization is terminated or revoked sooner.  Performed at [facility], [place] [address] [place], [place] [address]   Resp Panel by RT-PCR (Flu A&B, Covid) Nasopharyngeal Swab     Status: None   Collection Time: 02/25/20  5:46 AM   Specimen: Nasopharyngeal Swab; Nasopharyngeal(NP) swabs in vial transport medium  Result Value Ref Range Status   SARS Coronavirus 2 by RT PCR NEGATIVE NEGATIVE Final    Comment: (NOTE) SARS-CoV-2 target nucleic acids are NOT DETECTED.  The SARS-CoV-2 RNA is generally detectable in upper  respiratory specimens during the acute phase of infection. The lowest concentration of  SARS-CoV-2 viral copies this assay can detect is 138 copies/mL. A negative result does not preclude SARS-Cov-2 infection and should not be used as the sole basis for treatment or other patient management decisions. A negative result may occur with  improper specimen collection/handling, submission of specimen other than nasopharyngeal swab, presence of viral mutation(s) within the areas targeted by this assay, and inadequate number of viral copies(<138 copies/mL). A negative result must be combined with clinical observations, patient history, and epidemiological information. The expected result is Negative.  Fact Sheet for Patients:  EntrepreneurPulse.com.au  Fact Sheet for Healthcare Providers:  IncredibleEmployment.be  This test is no t yet approved or cleared by the [place] FDA and  has been authorized for detection and/or diagnosis of SARS-CoV-2 by FDA under an Emergency Use Authorization (EUA). This EUA will remain  in effect (meaning this test can be used) for the duration of the COVID-19 declaration under Section 564(b)(1) of the Act, 21 U.S.C.section 360bbb-3(b)(1), unless the authorization is terminated  or revoked sooner.       Influenza A by PCR NEGATIVE NEGATIVE Final   Influenza B by PCR NEGATIVE NEGATIVE Final    Comment: (NOTE) The Xpert Xpress SARS-CoV-2/FLU/RSV plus assay is intended as an aid in the diagnosis of influenza from Nasopharyngeal swab specimens and should not be used as a sole basis for treatment. Nasal washings and aspirates are unacceptable for Xpert Xpress SARS-CoV-2/FLU/RSV testing.  Fact Sheet for Patients: EntrepreneurPulse.com.au  Fact Sheet for Healthcare Providers: IncredibleEmployment.be  This test is not yet approved or cleared by the [place] FDA and has been authorized for detection and/or diagnosis of SARS-CoV-2 by FDA under an Emergency Use  Authorization (EUA). This EUA will remain in effect (meaning this test can be used) for the duration of the COVID-19 declaration under Section 564(b)(1) of the Act, 21 U.S.C. section 360bbb-3(b)(1), unless the authorization is terminated or revoked.  Performed at [facility], [address], [place], [place] [address]   Culture, body fluid-bottle     Status: None (Preliminary result)   Collection Time: 02/27/20 10:30 AM   Specimen: Ascitic  Result Value Ref Range Status   Specimen Description ASCITIC  Final   Special Requests 10CC  Final   Culture   Final    NO GROWTH < 24 HOURS Performed at [facility], [address], [place], [place] [address]    Report Status PENDING  Incomplete  Gram stain     Status: None   Collection Time: 02/27/20 11:00 AM   Specimen: Ascitic  Result Value Ref Range Status   Specimen Description ASCITIC  Final   Special Requests NONE  Final   Gram Stain   Final    ASCITIES FLUID NO ORGANISMS SEEN PRIMARILY MONONUMCLEA WBCS PRESENT Performed at [facility], [address], [place], [place] [address]    Report Status 02/27/2020 FINAL  Final     Radiology Studies: US RENAL  Result Date: 02/27/2020 CLINICAL DATA:  Inpatient.  Chronic kidney disease. EXAM: RENAL / URINARY TRACT ULTRASOUND COMPLETE COMPARISON:  02/24/2020 CT abdomen/pelvis. FINDINGS: Right Kidney: Renal measurements: 11.3 x 4.8 x 5.3 cm = volume: 150 mL. Echogenic right renal parenchyma, normal thickness. No hydronephrosis. Simple 2.7 x 2.4 x 2.2 cm upper right renal cyst. Simple 3.4 x 3.3 x 4.0 cm lower right renal cyst. Left Kidney: Renal measurements: 10.1 x 4.6 x 4.6 cm = volume: 112 mL. Echogenic left  renal parenchyma, normal thickness. No hydronephrosis. No left renal mass. Bladder: Appears normal for degree of bladder distention. Other: Large volume ascites incidentally noted. IMPRESSION: 1. No hydronephrosis. 2. Echogenic normal size kidneys, compatible with reported nonspecific chronic  renal parenchymal disease. 3. Simple right renal cysts. 4. Normal bladder. 5. Incidental large volume ascites. Electronically Signed   By: Ilona Sorrel M.D.   On: 02/27/2020 10:52   US Paracentesis  Result Date: 02/27/2020 INDICATION: Cirrhosis, ascites EXAM: ULTRASOUND GUIDED DIAGNOSTIC AND THERAPEUTIC PARACENTESIS MEDICATIONS: None COMPLICATIONS: None immediate PROCEDURE: Informed written consent was obtained from the patient after a discussion of the risks, benefits and alternatives to treatment. A timeout was performed prior to the initiation of the procedure. Initial ultrasound scanning demonstrates a large amount of ascites within the right lower abdominal quadrant. The right lower abdomen was prepped and draped in the usual sterile fashion. 1% lidocaine was used for local anesthesia. Following this, a 5 [place] Yueh catheter was introduced. An ultrasound image was saved for documentation purposes. The paracentesis was performed. The catheter was removed and a dressing was applied. The patient tolerated the procedure well without immediate post procedural complication. Patient received post-procedure intravenous albumin; see nursing notes for details. FINDINGS: A total of approximately 6.2 L of yellow ascitic fluid was removed. Samples were sent to the laboratory as requested by the clinical team. IMPRESSION: Successful ultrasound-guided paracentesis yielding 6.2 liters of peritoneal fluid. Electronically Signed   By: Lavonia Dana M.D.   On: 02/27/2020 11:40   DG Chest Portable 1 View  Result Date: 02/27/2020 CLINICAL DATA:  Placement EXAM: PORTABLE CHEST 1 VIEW COMPARISON:  02/24/2020 FINDINGS: The heart size and mediastinal contours are within normal limits. Interval placement of right neck vascular catheter, tip projecting near the superior cavoatrial junction. Both lungs are clear. The visualized skeletal structures are unremarkable. IMPRESSION: Interval placement of right neck vascular catheter, tip  projecting near the superior cavoatrial junction. Electronically Signed   By: Eddie Candle M.D.   On: 02/27/2020 12:52   Scheduled Meds: . sodium chloride   Intravenous Once  . sodium chloride   Intravenous Once  . Chlorhexidine Gluconate Cloth  6 each Topical Daily  . doxycycline  100 mg Oral Q12H  . lactulose  30 g Oral TID  . pantoprazole  40 mg Oral Daily  . sodium chloride flush  10 mL Intravenous Q12H  . [START ON 02/29/2020] thiamine  100 mg Oral Daily   Continuous Infusions: . sodium bicarbonate (isotonic) 150 mEq in D5W 1000 mL infusion 50 mL/hr at 02/28/20 1525     LOS: 3 days   Time spent: 35 mins  Danashia Landers Wynetta Emery, MD How to contact the [facility] Attending or Consulting provider [place] or covering provider during after hours [place], for this patient?  1. Check the care team in [facility] and look for a) attending/consulting TRH provider listed and b) the [facility] team listed 2. Log into [email or web address] and use [place]'s universal password to access. If you do not have the password, please contact the hospital operator. 3. Locate the [facility] provider you are looking for under [facility] and page to a number that you can be directly reached. 4. If you still have difficulty reaching the provider, please page the [facility] (Director on Call) for the Hospitalists listed on amion for assistance.  02/28/2020, 5:09 PM

## 2020-02-28 NOTE — Evaluation (Signed)
Physical Therapy Evaluation Patient Details Name: Darren Howard MRN: [id number] DOB: May 13, 1950 Today's Date: 02/28/2020   History of Present Illness  Darren Howard is a 70 year old male with history of alcohol abuse, HTN, and liver cirrhosis presented to the emergency department with acute altered mental status changes.  Clinical Impression  Pt admitted with above diagnosis. Pt able to come to EOB with cues, requiring min A to power up with cues for hand placement and mod A with slow, unsteady steps in room using RW. Pt with 3 STE home, lives alone and previously independent, so recommending SNF for short term strengthening prior to d/c home.  Pt currently with functional limitations due to the deficits listed below (see PT Problem List). Pt will benefit from skilled PT to increase their independence and safety with mobility to allow discharge to the venue listed below.       Follow Up Recommendations SNF    Equipment Recommendations  None recommended by PT    Recommendations for Other Services       Precautions / Restrictions Precautions Precautions: Fall Restrictions Weight Bearing Restrictions: No      Mobility  Bed Mobility Overal bed mobility: Needs Assistance Bed Mobility: Supine to Sit  Supine to sit: Min guard;HOB elevated    General bed mobility comments: slow to mobilize, cued for hand placement on bedrail to assist in uprighting trunk and scooting out to EOB    Transfers Overall transfer level: Needs assistance Equipment used: Rolling walker (2 wheeled) Transfers: Sit to/from Stand Sit to Stand: Min assist;From elevated surface  General transfer comment: cued for hand placement on seated surface to power up, min A to rise from EOB and cues for quad/glute activation, hip extension last  Ambulation/Gait Ambulation/Gait assistance: Mod assist Gait Distance (Feet): 3 Feet Assistive device: Rolling walker (2 wheeled) Gait Pattern/deviations: Step-to  pattern;Shuffle;Trunk flexed Gait velocity: decreased   General Gait Details: short, slow, unsteady, shuffling steps at bedside, cued for upright posture and shifting weight anterior due to maintaining weight through heels  Stairs            Wheelchair Mobility    Modified Rankin (Stroke Patients Only)       Balance Overall balance assessment: Needs assistance Sitting-balance support: Feet supported Sitting balance-Leahy Scale: Good Sitting balance - Comments: seated EOB   Standing balance support: During functional activity;Bilateral upper extremity supported Standing balance-Leahy Scale: Poor Standing balance comment: reliant on RW        Pertinent Vitals/Pain Pain Assessment: No/denies pain    Home Living Family/patient expects to be discharged to:: Private residence Living Arrangements: Alone Available Help at Discharge: Family;Available PRN/intermittently Type of Home: House Home Access: Stairs to enter Entrance Stairs-Rails: None Entrance Stairs-Number of Steps: 3 Home Layout: Two level;Able to live on main level with bedroom/bathroom Home Equipment: Gilford Rile - 2 wheels;Cane - single point      Prior Function Level of Independence: Independent with assistive device(s)         Comments: Pt reports community ambulator with RW, drives, denies recent falls, independent with ADLs.     Hand Dominance   Dominant Hand: Right    Extremity/Trunk Assessment   Upper Extremity Assessment Upper Extremity Assessment: Overall WFL for tasks assessed    Lower Extremity Assessment Lower Extremity Assessment: Generalized weakness (AROM WNL, strength gorsly 3+/5, denies numbness/tingling throughout BLE)    Cervical / Trunk Assessment Cervical / Trunk Assessment: Normal  Communication   Communication: No difficulties  Cognition Arousal/Alertness: Awake/alert Behavior  During Therapy: WFL for tasks assessed/performed Overall Cognitive Status: Within Functional  Limits for tasks assessed     General Comments      Exercises     Assessment/Plan    PT Assessment Patient needs continued PT services  PT Problem List Decreased strength;Decreased activity tolerance;Decreased balance;Decreased knowledge of use of DME       PT Treatment Interventions DME instruction;Gait training;Functional mobility training;Therapeutic activities;Therapeutic exercise;Balance training;Neuromuscular re-education;Patient/family education    PT Goals (Current goals can be found in the Care Plan section)  Acute Rehab PT Goals Patient Stated Goal: home, maybe some therapy PT Goal Formulation: With patient Time For Goal Achievement: 03/13/20 Potential to Achieve Goals: Good    Frequency Min 2X/week   Barriers to discharge        Co-evaluation               AM-PAC PT "6 Clicks" Mobility  Outcome Measure Help needed turning from your back to your side while in a flat bed without using bedrails?: None Help needed moving from lying on your back to sitting on the side of a flat bed without using bedrails?: A Little Help needed moving to and from a bed to a chair (including a wheelchair)?: A Little Help needed standing up from a chair using your arms (e.g., wheelchair or bedside chair)?: A Little Help needed to walk in hospital room?: A Lot Help needed climbing 3-5 steps with a railing? : Total 6 Click Score: 16    End of Session Equipment Utilized During Treatment: Gait belt Activity Tolerance: Patient tolerated treatment well Patient left: in chair;with call bell/phone within reach Nurse Communication: Mobility status PT Visit Diagnosis: Unsteadiness on feet (R26.81);Other abnormalities of gait and mobility (R26.89);Muscle weakness (generalized) (M62.81)    Time: 3220-2542 PT Time Calculation (min) (ACUTE ONLY): 18 min   Charges:   PT Evaluation $PT Eval Low Complexity: 1 Low           Tori Jamilla Galli PT, DPT 02/28/20, 1:20  PM [phone]

## 2020-02-29 LAB — COMPREHENSIVE METABOLIC PANEL
ALT: 16 U/L (ref 0–44)
AST: 25 U/L (ref 15–41)
Albumin: 2 g/dL — ABNORMAL LOW (ref 3.5–5.0)
Alkaline Phosphatase: 48 U/L (ref 38–126)
Anion gap: 9 (ref 5–15)
BUN: 36 mg/dL — ABNORMAL HIGH (ref 8–23)
CO2: 17 mmol/L — ABNORMAL LOW (ref 22–32)
Calcium: 7.3 mg/dL — ABNORMAL LOW (ref 8.9–10.3)
Chloride: 108 mmol/L (ref 98–111)
Creatinine, Ser: 3.81 mg/dL — ABNORMAL HIGH (ref 0.61–1.24)
GFR, Estimated: 17 mL/min — ABNORMAL LOW (ref >60)
Glucose, Bld: 147 mg/dL — ABNORMAL HIGH (ref 70–99)
Potassium: 3.5 mmol/L (ref 3.5–5.1)
Sodium: 134 mmol/L — ABNORMAL LOW (ref 135–145)
Total Bilirubin: 2.4 mg/dL — ABNORMAL HIGH (ref 0.3–1.2)
Total Protein: 5.7 g/dL — ABNORMAL LOW (ref 6.5–8.1)

## 2020-02-29 LAB — CBC
HCT: 22.6 % — ABNORMAL LOW (ref 39.0–52.0)
Hemoglobin: 8.1 g/dL — ABNORMAL LOW (ref 13.0–17.0)
MCH: 33.1 pg (ref 26.0–34.0)
MCHC: 35.8 g/dL (ref 30.0–36.0)
MCV: 92.2 fL (ref 80.0–100.0)
Platelets: 105 10*3/uL — ABNORMAL LOW (ref 150–400)
RBC: 2.45 MIL/uL — ABNORMAL LOW (ref 4.22–5.81)
RDW: 17.4 % — ABNORMAL HIGH (ref 11.5–15.5)
WBC: 7.1 10*3/uL (ref 4.0–10.5)
nRBC: 0 % (ref 0.0–0.2)

## 2020-02-29 LAB — AMMONIA: Ammonia: 59 umol/L — ABNORMAL HIGH (ref 9–35)

## 2020-02-29 LAB — CULTURE, BLOOD (ROUTINE X 2)
Culture: NO GROWTH
Culture: NO GROWTH

## 2020-02-29 LAB — PHOSPHORUS: Phosphorus: 3.9 mg/dL (ref 2.5–4.6)

## 2020-02-29 LAB — MAGNESIUM: Magnesium: 2.3 mg/dL (ref 1.7–2.4)

## 2020-02-29 MED ORDER — POTASSIUM CHLORIDE CRYS ER 20 MEQ PO TBCR
40.0000 meq | EXTENDED_RELEASE_TABLET | Freq: Once | ORAL | Status: AC
  Administered 2020-02-29: 40 meq via ORAL
  Filled 2020-02-29: qty 2

## 2020-02-29 NOTE — Progress Notes (Signed)
PROGRESS NOTE   Darren Howard  EXB:[id number] DOB: May 30, 1950 DOA: 02/24/2020 PCP: [name], MD   Chief Complaint  Patient presents with  . Altered Mental Status   Brief Admission History:  70 year old male with history of alcohol abuse hypertension and liver cirrhosis presented to the emergency department with acute altered mental status changes.  He was brought by EMS and they found him living in a home with no heat and lying in his own excrement.  He was admitted with hepatic encephalopathy.  Assessment & Plan:   Principal Problem:   Acute encephalopathy Active Problems:   AKI (acute kidney injury) ([place])   Hepatorenal syndrome (HCC)   Alcoholic cirrhosis of liver with ascites (HCC)   Sepsis (HCC)   Hyperammonemia (HCC)   Hepatic encephalopathy ([place])  1. Hepatic encephalopathy-RESOLVED.  Patient has responded well to the lactulose treatments and ammonia level is trending down.  His mentation is improved.  He cannot go home without an evaluation by social services as he was living in squalor. 2. Hyperammonemia-improving with lactulose treatments. 3. Stage 4 CKD - this appears to be progression of chronic kidney disease and not acute injury. 4. Metabolic acidosis - improving with  bicarbonate infusion.  5. Liver cirrhosis-we will have him follow-up with GI clinic after discharge. 6. Hypokalemia /Hypomagnesemia - replacement given.  7. Anemia of chronic disease- likely down from hemodilution no active bleeding has been found.  Transfused 1 unit PRBC.  8. Ascites- ultrasound paracentesis ordered with sample sent for fluid collection.  SBP ruled out. 9. Generalized weakness/deconditioning and poor living conditions--- awaiting transfer to SNF Rehab  DVT prophylaxis: SCDs Code Status: Full Family Communication:  Disposition: To be determined after evaluation by social services  Status is: Inpatient  Remains inpatient appropriate because:IV treatments appropriate due to  intensity of illness or inability to take PO and Inpatient level of care appropriate due to severity of illness  Dispo: The patient is from: Home              Anticipated d/c is to: SNF              Anticipated d/c date is: 1 day              Patient currently is not medically stable to d/c.  Consultants:   TOC  Procedures:    US paracentesis  Antimicrobials:  Anti-infectives (From admission, onward)   Start     Dose/Rate Route Frequency Ordered Stop   02/27/20 1000  doxycycline (VIBRA-TABS) tablet 100 mg  Status:  Discontinued        100 mg Oral Every 12 hours 02/27/20 0748 02/28/20 1714   02/26/20 2300  vancomycin (VANCOCIN) IVPB 1000 mg/200 mL premix  Status:  Discontinued        1,000 mg 200 mL/hr over 60 Minutes Intravenous  Once 02/25/20 0925 02/26/20 1114   02/26/20 1130  vancomycin (VANCOCIN) IVPB 1000 mg/200 mL premix        1,000 mg 200 mL/hr over 60 Minutes Intravenous  Once 02/26/20 1114 02/26/20 1405   02/25/20 2100  ceFEPIme (MAXIPIME) 2 g in sodium chloride 0.9 % 100 mL IVPB  Status:  Discontinued        2 g 200 mL/hr over 30 Minutes Intravenous Every 24 hours 02/24/20 2145 02/27/20 0748   02/25/20 0600  metroNIDAZOLE (FLAGYL) IVPB 500 mg  Status:  Discontinued        500 mg 100 mL/hr over 60 Minutes Intravenous Every 8  hours 02/25/20 0043 02/27/20 0843   02/24/20 2200  vancomycin (VANCOREADY) IVPB 1250 mg/250 mL        1,250 mg 166.7 mL/hr over 90 Minutes Intravenous  Once 02/24/20 2141 02/25/20 0038   02/24/20 2142  vancomycin variable dose per unstable renal function (pharmacist dosing)  Status:  Discontinued         Does not apply See admin instructions 02/24/20 2142 02/27/20 0748   02/24/20 2100  ceFEPIme (MAXIPIME) 2 g in sodium chloride 0.9 % 100 mL IVPB        2 g 200 mL/hr over 30 Minutes Intravenous  Once 02/24/20 2057 02/24/20 2157   02/24/20 2100  vancomycin (VANCOCIN) IVPB 1000 mg/200 mL premix  Status:  Discontinued        1,000 mg 200 mL/hr over  60 Minutes Intravenous  Once 02/24/20 2057 02/24/20 2139   02/24/20 2100  piperacillin-tazobactam (ZOSYN) IVPB 3.375 g  Status:  Discontinued        3.375 g 12.5 mL/hr over 240 Minutes Intravenous  Once 02/24/20 2057 02/25/20 0213     Subjective: - No further emesis, eating and drinking okay  Objective: Vitals:   02/28/20 1300 02/28/20 2139 02/29/20 0534 02/29/20 1400  BP: 125/75 120/80 118/66 127/79  Pulse: 85 82 91 86  Resp: 18 17 19    Temp: 98.6 F (37 C) 98.2 F (36.8 C) 97.8 F (36.6 C) 98.1 F (36.7 C)  TempSrc: Oral  Oral Oral  SpO2: 100% 100% 100% 100%  Weight:      Height:        Intake/Output Summary (Last 24 hours) at 02/29/2020 1709 Last data filed at 02/29/2020 1300 Gross per 24 hour  Intake 720 ml  Output 950 ml  Net -230 ml   Filed Weights   02/24/20 1812  Weight: 68 kg    Examination:  General exam: emaciated, elderly, frail male, appears older than stated age, Appears calm and comfortable  Respiratory system: shallow BS bilateral. Respiratory effort normal. Cardiovascular system: normal S1 & S2 heard. No JVD, murmurs, rubs, gallops or clicks. No pedal edema. Gastrointestinal system: Abdomen is distended, soft and mildly tender.  . Normal bowel sounds heard. Central nervous system: Generalized weakness, alert and oriented. No focal neurological deficits. Extremities: Symmetric 5 x 5 power. Skin: No rashes, lesions or ulcers Psychiatry: Judgement and insight appear poor. Mood & affect appropriate.   Data Reviewed: I have personally reviewed following labs and imaging studies  CBC: Recent Labs  Lab 02/24/20 1940 02/25/20 0739 02/26/20 0940 02/27/20 0750 02/27/20 2355 02/28/20 0923 02/29/20 0607  WBC 14.5* 12.6* 8.9 8.2  --  7.3 7.1  NEUTROABS 11.4*  --   --   --   --   --   --   HGB 8.0* 7.6* 6.8* 6.5* 8.1* 8.1* 8.1*  HCT 24.0* 22.2* 20.8* 19.6* 24.3* 23.6* 22.6*  MCV 96.4 96.5 97.7 97.0  --  92.2 92.2  PLT 235 192 156 129*  --  113* 105*     Basic Metabolic Panel: Recent Labs  Lab 02/25/20 0500 02/26/20 0940 02/27/20 0750 02/28/20 0000 02/29/20 0607  NA 144 145 140 137 134*  K 4.2 3.5 3.3* 3.3* 3.5  CL 118* 120* 115* 113* 108  CO2 13* 14* 14* 15* 17*  GLUCOSE 100* 95 140* 193* 147*  BUN 51* 47* 42* 38* 36*  CREATININE 3.95* 3.81* 3.81* 3.59* 3.81*  CALCIUM 8.0* 8.3* 7.7* 7.5* 7.3*  MG  --  1.5* 1.6* 2.6*  2.3  PHOS  --   --   --   --  3.9    GFR: Estimated Creatinine Clearance: 18.1 mL/min (A) (by C-G formula based on SCr of 3.81 mg/dL (H)).  Liver Function Tests: Recent Labs  Lab 02/25/20 0500 02/26/20 0940 02/27/20 0750 02/28/20 0000 02/29/20 0607  AST 51* 54* 41 32 25  ALT 27 26 23 18 16   ALKPHOS 69 60 54 51 48  BILITOT 4.7* 3.3* 2.4* 3.1* 2.4*  PROT 6.9 6.6 6.2* 5.7* 5.7*  ALBUMIN 2.7* 2.6* 2.4* 2.1* 2.0*    CBG: No results for input(s): GLUCAP in the last 168 hours.  Recent Results (from the past 240 hour(s))  Culture, blood (Routine X 2) w Reflex to ID Panel     Status: None   Collection Time: 02/24/20  7:40 PM   Specimen: BLOOD RIGHT FOREARM  Result Value Ref Range Status   Specimen Description BLOOD RIGHT FOREARM  Final   Special Requests   Final    BOTTLES DRAWN AEROBIC AND ANAEROBIC Blood Culture results may not be optimal due to an inadequate volume of blood received in culture bottles   Culture   Final    NO GROWTH 5 DAYS Performed at [facility], [address] [place], [place] [address]    Report Status 02/29/2020 FINAL  Final  Culture, blood (Routine X 2) w Reflex to ID Panel     Status: None   Collection Time: 02/24/20  7:43 PM   Specimen: Left Antecubital; Blood  Result Value Ref Range Status   Specimen Description LEFT ANTECUBITAL  Final   Special Requests   Final    BOTTLES DRAWN AEROBIC AND ANAEROBIC Blood Culture results may not be optimal due to an inadequate volume of blood received in culture bottles   Culture   Final    NO GROWTH 5 DAYS Performed at [facility], [address], [place], [place] [address]    Report Status 02/29/2020 FINAL  Final  SARS CORONAVIRUS 2 (TAT 6-24 HRS) Nasopharyngeal Nasopharyngeal Swab     Status: None   Collection Time: 02/25/20 12:19 AM   Specimen: Nasopharyngeal Swab  Result Value Ref Range Status   SARS Coronavirus 2 NEGATIVE NEGATIVE Final    Comment: (NOTE) SARS-CoV-2 target nucleic acids are NOT DETECTED.  The SARS-CoV-2 RNA is generally detectable in upper and lower respiratory specimens during the acute phase of infection. Negative results do not preclude SARS-CoV-2 infection, do not rule out co-infections with other pathogens, and should not be used as the sole basis for treatment or other patient management decisions. Negative results must be combined with clinical observations, patient history, and epidemiological information. The expected result is Negative.  Fact Sheet for Patients: SugarRoll.be  Fact Sheet for Healthcare Providers: https://www.woods-mathews.com/  This test is not yet approved or cleared by the [place] FDA and  has been authorized for detection and/or diagnosis of SARS-CoV-2 by FDA under an Emergency Use Authorization (EUA). This EUA will remain  in effect (meaning this test can be used) for the duration of the COVID-19 declaration under Se ction 564(b)(1) of the Act, 21 U.S.C. section 360bbb-3(b)(1), unless the authorization is terminated or revoked sooner.  Performed at [facility], [place] [address], [place],  [address]   Resp Panel by RT-PCR (Flu A&B, Covid) Nasopharyngeal Swab     Status: None   Collection Time: 02/25/20  5:46 AM   Specimen: Nasopharyngeal Swab; Nasopharyngeal(NP) swabs in vial transport medium  Result Value Ref Range Status   SARS Coronavirus 2 by RT PCR NEGATIVE NEGATIVE Final    Comment: (NOTE) SARS-CoV-2 target nucleic acids are NOT DETECTED.  The SARS-CoV-2 RNA is generally detectable in  upper respiratory specimens during the acute phase of infection. The lowest concentration of SARS-CoV-2 viral copies this assay can detect is 138 copies/mL. A negative result does not preclude SARS-Cov-2 infection and should not be used as the sole basis for treatment or other patient management decisions. A negative result may occur with  improper specimen collection/handling, submission of specimen other than nasopharyngeal swab, presence of viral mutation(s) within the areas targeted by this assay, and inadequate number of viral copies(<138 copies/mL). A negative result must be combined with clinical observations, patient history, and epidemiological information. The expected result is Negative.  Fact Sheet for Patients:  EntrepreneurPulse.com.au  Fact Sheet for Healthcare Providers:  IncredibleEmployment.be  This test is no t yet approved or cleared by the [place] FDA and  has been authorized for detection and/or diagnosis of SARS-CoV-2 by FDA under an Emergency Use Authorization (EUA). This EUA will remain  in effect (meaning this test can be used) for the duration of the COVID-19 declaration under Section 564(b)(1) of the Act, 21 U.S.C.section 360bbb-3(b)(1), unless the authorization is terminated  or revoked sooner.       Influenza A by PCR NEGATIVE NEGATIVE Final   Influenza B by PCR NEGATIVE NEGATIVE Final    Comment: (NOTE) The Xpert Xpress SARS-CoV-2/FLU/RSV plus assay is intended as an aid in the diagnosis of influenza from Nasopharyngeal swab specimens and should not be used as a sole basis for treatment. Nasal washings and aspirates are unacceptable for Xpert Xpress SARS-CoV-2/FLU/RSV testing.  Fact Sheet for Patients: EntrepreneurPulse.com.au  Fact Sheet for Healthcare Providers: IncredibleEmployment.be  This test is not yet approved or cleared by the [place] FDA and has been  authorized for detection and/or diagnosis of SARS-CoV-2 by FDA under an Emergency Use Authorization (EUA). This EUA will remain in effect (meaning this test can be used) for the duration of the COVID-19 declaration under Section 564(b)(1) of the Act, 21 U.S.C. section 360bbb-3(b)(1), unless the authorization is terminated or revoked.  Performed at [facility], [address] [place], [place] [address]   Culture, body fluid-bottle     Status: None (Preliminary result)   Collection Time: 02/27/20 10:30 AM   Specimen: Ascitic  Result Value Ref Range Status   Specimen Description ASCITIC  Final   Special Requests 10CC  Final   Culture   Final    NO GROWTH 2 DAYS Performed at [facility], [address] [place], [place] [address]    Report Status PENDING  Incomplete  Gram stain     Status: None   Collection Time: 02/27/20 11:00 AM   Specimen: Ascitic  Result Value Ref Range Status   Specimen Description ASCITIC  Final   Special Requests NONE  Final   Gram Stain   Final    ASCITIES FLUID NO ORGANISMS SEEN PRIMARILY MONONUMCLEA WBCS PRESENT Performed at [facility], [address] [place], [place] [address]    Report Status 02/27/2020 FINAL  Final     Radiology Studies: No results found. Scheduled Meds: . sodium chloride   Intravenous Once  . sodium chloride   Intravenous Once  . Chlorhexidine Gluconate Cloth  6 each Topical Daily  . lactulose  30 g Oral TID  . pantoprazole  40 mg Oral Daily  . potassium chloride  40 mEq Oral Once  . sodium chloride flush  10 mL Intravenous Q12H  . thiamine  100 mg Oral Daily   Continuous Infusions: . sodium bicarbonate (isotonic) 150 mEq in D5W 1000 mL infusion 50 mL/hr at 02/28/20 1525     LOS: 4 days   Roxan Hockey, MD How to contact the [facility] Attending or Consulting provider [place] or covering provider during after hours [place], for this patient?  1. Check the care team in [facility] and look for a) attending/consulting TRH provider  listed and b) the [facility] team listed 2. Log into [email or web address] and use [place]'s universal password to access. If you do not have the password, please contact the hospital operator. 3. Locate the [facility] provider you are looking for under [facility] and page to a number that you can be directly reached. 4. If you still have difficulty reaching the provider, please page the [facility] (Director on Call) for the Hospitalists listed on amion for assistance.  02/29/2020, 5:09 PM

## 2020-02-29 NOTE — TOC Progression Note (Signed)
Transition of Care [facility]) - Progression Note    Patient Details  Name: Darren Howard MRN: [id number] Date of Birth: 1950/06/28  [facility] of [facility]) CM/SW Contact  Natasha Bence, LCSW Phone Number: 02/29/2020, 4:44 PM  Clinical Narrative:    CSW received auth for SNF. Auth number is [id number]. Patient's son reported that he preferred [facility] for SNF placement and that patient had not been vaccinated for Covid 19. Debbie with [facility] agreeable to take patient, but reported due to patient's vaccination status, patient would need an isolation bed. Debbie reported that an isolation bed would not be available until 03/01/2020. Patient not approved for EMS transportation. CSW scheduled [facility] transportation for patient at 11:00am for 03/01/2020. TOC to follow.   Expected Discharge Plan: Skilled Nursing Facility Barriers to Discharge: Continued Medical Work up  Expected Discharge Plan and Services Expected Discharge Plan: [place] In-house Referral: NA Discharge Planning Services: NA Post Acute Care Choice: [place] Living arrangements for the past 2 months: Single Family Home                 DME Arranged: N/A DME Agency: NA                   Social Determinants of Health (SDOH) Interventions    Readmission Risk Interventions Readmission Risk Prevention Plan 02/27/2020 01/26/2020 01/23/2020  Post Dischage Appt - - -  Medication Screening - - -  Transportation Screening Complete Complete Complete  PCP or Specialist Appt within 5-7 Days - - -  PCP or Specialist Appt within 3-5 Days - Complete Complete  Home Care Screening - - -  Medication Review (RN CM) - - -  [place] or Home Care Consult Complete Complete Complete  Social Work Consult for [place] Planning/Counseling Complete Complete Complete  Palliative Care Screening Not Applicable Not Applicable Not Applicable  Medication Review Press photographer) Complete Complete Complete  Some recent  data might be hidden

## 2020-03-01 ENCOUNTER — Inpatient Hospital Stay (HOSPITAL_COMMUNITY): Payer: PPO

## 2020-03-01 DIAGNOSIS — E871 Hypo-osmolality and hyponatremia: Secondary | ICD-10-CM | POA: Diagnosis not present

## 2020-03-01 DIAGNOSIS — E872 Acidosis: Secondary | ICD-10-CM | POA: Diagnosis not present

## 2020-03-01 DIAGNOSIS — K319 Disease of stomach and duodenum, unspecified: Secondary | ICD-10-CM | POA: Diagnosis not present

## 2020-03-01 DIAGNOSIS — F1010 Alcohol abuse, uncomplicated: Secondary | ICD-10-CM | POA: Diagnosis not present

## 2020-03-01 DIAGNOSIS — G9340 Encephalopathy, unspecified: Secondary | ICD-10-CM | POA: Diagnosis not present

## 2020-03-01 DIAGNOSIS — F17210 Nicotine dependence, cigarettes, uncomplicated: Secondary | ICD-10-CM | POA: Diagnosis not present

## 2020-03-01 DIAGNOSIS — G9341 Metabolic encephalopathy: Secondary | ICD-10-CM | POA: Diagnosis not present

## 2020-03-01 DIAGNOSIS — K7011 Alcoholic hepatitis with ascites: Secondary | ICD-10-CM | POA: Diagnosis not present

## 2020-03-01 DIAGNOSIS — R609 Edema, unspecified: Secondary | ICD-10-CM | POA: Diagnosis not present

## 2020-03-01 DIAGNOSIS — Z515 Encounter for palliative care: Secondary | ICD-10-CM | POA: Diagnosis not present

## 2020-03-01 DIAGNOSIS — K7031 Alcoholic cirrhosis of liver with ascites: Secondary | ICD-10-CM | POA: Diagnosis not present

## 2020-03-01 DIAGNOSIS — D631 Anemia in chronic kidney disease: Secondary | ICD-10-CM | POA: Diagnosis not present

## 2020-03-01 DIAGNOSIS — N184 Chronic kidney disease, stage 4 (severe): Secondary | ICD-10-CM | POA: Diagnosis not present

## 2020-03-01 DIAGNOSIS — R404 Transient alteration of awareness: Secondary | ICD-10-CM | POA: Diagnosis not present

## 2020-03-01 DIAGNOSIS — R109 Unspecified abdominal pain: Secondary | ICD-10-CM | POA: Diagnosis present

## 2020-03-01 DIAGNOSIS — N189 Chronic kidney disease, unspecified: Secondary | ICD-10-CM | POA: Diagnosis not present

## 2020-03-01 DIAGNOSIS — E1122 Type 2 diabetes mellitus with diabetic chronic kidney disease: Secondary | ICD-10-CM | POA: Diagnosis not present

## 2020-03-01 DIAGNOSIS — K7460 Unspecified cirrhosis of liver: Secondary | ICD-10-CM | POA: Diagnosis not present

## 2020-03-01 DIAGNOSIS — R2681 Unsteadiness on feet: Secondary | ICD-10-CM | POA: Diagnosis not present

## 2020-03-01 DIAGNOSIS — M6281 Muscle weakness (generalized): Secondary | ICD-10-CM | POA: Diagnosis not present

## 2020-03-01 DIAGNOSIS — R188 Other ascites: Secondary | ICD-10-CM | POA: Diagnosis not present

## 2020-03-01 DIAGNOSIS — K7290 Hepatic failure, unspecified without coma: Secondary | ICD-10-CM | POA: Diagnosis not present

## 2020-03-01 DIAGNOSIS — Z66 Do not resuscitate: Secondary | ICD-10-CM | POA: Diagnosis not present

## 2020-03-01 DIAGNOSIS — Z8711 Personal history of peptic ulcer disease: Secondary | ICD-10-CM | POA: Diagnosis not present

## 2020-03-01 DIAGNOSIS — Z720 Tobacco use: Secondary | ICD-10-CM | POA: Diagnosis not present

## 2020-03-01 DIAGNOSIS — A419 Sepsis, unspecified organism: Secondary | ICD-10-CM | POA: Diagnosis not present

## 2020-03-01 DIAGNOSIS — K759 Inflammatory liver disease, unspecified: Secondary | ICD-10-CM | POA: Diagnosis not present

## 2020-03-01 DIAGNOSIS — Z20822 Contact with and (suspected) exposure to covid-19: Secondary | ICD-10-CM | POA: Diagnosis not present

## 2020-03-01 DIAGNOSIS — I10 Essential (primary) hypertension: Secondary | ICD-10-CM | POA: Diagnosis not present

## 2020-03-01 DIAGNOSIS — Z8249 Family history of ischemic heart disease and other diseases of the circulatory system: Secondary | ICD-10-CM | POA: Diagnosis not present

## 2020-03-01 DIAGNOSIS — E7220 Disorder of urea cycle metabolism, unspecified: Secondary | ICD-10-CM | POA: Diagnosis not present

## 2020-03-01 DIAGNOSIS — R0902 Hypoxemia: Secondary | ICD-10-CM | POA: Diagnosis not present

## 2020-03-01 DIAGNOSIS — Z9119 Patient's noncompliance with other medical treatment and regimen: Secondary | ICD-10-CM | POA: Diagnosis not present

## 2020-03-01 DIAGNOSIS — K921 Melena: Secondary | ICD-10-CM | POA: Diagnosis not present

## 2020-03-01 DIAGNOSIS — I129 Hypertensive chronic kidney disease with stage 1 through stage 4 chronic kidney disease, or unspecified chronic kidney disease: Secondary | ICD-10-CM | POA: Diagnosis not present

## 2020-03-01 DIAGNOSIS — E785 Hyperlipidemia, unspecified: Secondary | ICD-10-CM | POA: Diagnosis not present

## 2020-03-01 DIAGNOSIS — E119 Type 2 diabetes mellitus without complications: Secondary | ICD-10-CM | POA: Diagnosis not present

## 2020-03-01 DIAGNOSIS — D689 Coagulation defect, unspecified: Secondary | ICD-10-CM | POA: Diagnosis not present

## 2020-03-01 DIAGNOSIS — R4182 Altered mental status, unspecified: Secondary | ICD-10-CM | POA: Diagnosis not present

## 2020-03-01 DIAGNOSIS — K767 Hepatorenal syndrome: Secondary | ICD-10-CM | POA: Diagnosis not present

## 2020-03-01 DIAGNOSIS — K7040 Alcoholic hepatic failure without coma: Secondary | ICD-10-CM | POA: Diagnosis not present

## 2020-03-01 DIAGNOSIS — N179 Acute kidney failure, unspecified: Secondary | ICD-10-CM | POA: Diagnosis not present

## 2020-03-01 DIAGNOSIS — K649 Unspecified hemorrhoids: Secondary | ICD-10-CM | POA: Diagnosis not present

## 2020-03-01 LAB — COMPREHENSIVE METABOLIC PANEL
ALT: 16 U/L (ref 0–44)
AST: 26 U/L (ref 15–41)
Albumin: 2.1 g/dL — ABNORMAL LOW (ref 3.5–5.0)
Alkaline Phosphatase: 55 U/L (ref 38–126)
Anion gap: 8 (ref 5–15)
BUN: 38 mg/dL — ABNORMAL HIGH (ref 8–23)
CO2: 22 mmol/L (ref 22–32)
Calcium: 7.7 mg/dL — ABNORMAL LOW (ref 8.9–10.3)
Chloride: 106 mmol/L (ref 98–111)
Creatinine, Ser: 4.01 mg/dL — ABNORMAL HIGH (ref 0.61–1.24)
GFR, Estimated: 16 mL/min — ABNORMAL LOW (ref >60)
Glucose, Bld: 147 mg/dL — ABNORMAL HIGH (ref 70–99)
Potassium: 3.7 mmol/L (ref 3.5–5.1)
Sodium: 136 mmol/L (ref 135–145)
Total Bilirubin: 2.3 mg/dL — ABNORMAL HIGH (ref 0.3–1.2)
Total Protein: 5.8 g/dL — ABNORMAL LOW (ref 6.5–8.1)

## 2020-03-01 MED ORDER — PANTOPRAZOLE SODIUM 40 MG PO TBEC: 40.0000 mg | DELAYED_RELEASE_TABLET | Freq: Every day | ORAL | 3 refills | Status: AC

## 2020-03-01 MED ORDER — SODIUM BICARBONATE 650 MG PO TABS: 650.0000 mg | ORAL_TABLET | Freq: Two times a day (BID) | ORAL | 5 refills | Status: AC

## 2020-03-01 MED ORDER — THIAMINE HCL 100 MG PO TABS: 100.0000 mg | ORAL_TABLET | Freq: Every day | ORAL | 3 refills | Status: AC

## 2020-03-01 MED ORDER — SPIRONOLACTONE 25 MG PO TABS: 25.0000 mg | ORAL_TABLET | Freq: Every day | ORAL | 3 refills | Status: AC

## 2020-03-01 MED ORDER — SACCHAROMYCES BOULARDII 250 MG PO CAPS
250.0000 mg | ORAL_CAPSULE | Freq: Two times a day (BID) | ORAL | 0 refills | Status: AC
Start: 2020-03-01 — End: ?

## 2020-03-01 MED ORDER — LACTULOSE 10 GM/15ML PO SOLN: 30.0000 g | Freq: Three times a day (TID) | ORAL | 3 refills | Status: AC

## 2020-03-01 NOTE — Care Management Important Message (Signed)
Important Message  Patient Details  Name: Darren Howard MRN: [id number] Date of Birth: 11-23-1950   Medicare Important Message Given:  Yes     [name] 03/01/2020, 12:10 PM

## 2020-03-01 NOTE — Progress Notes (Signed)
PICC line removed per protocol without complications. Vaseline pressure dressing applied.

## 2020-03-01 NOTE — TOC Transition Note (Signed)
Transition of Care [facility]) - CM/SW Discharge Note   Patient Details  Name: Darren Howard MRN: [id number] Date of Birth: Feb 14, 1951  [facility]) CM/SW Contact:  Shade Flood, LCSW Phone Number: 03/01/2020, 2:45 PM   Clinical Narrative:     Pt stable for dc to [facility] today per MD. Updated Darren Howard at [facility] and they can take pt today. [facility] to transport at 3pm. Updated pt's son, Darren Howard, by phone. He remains in agreement with the plan. RN to call report. DC clinical sent electronically. There are no other TOC needs for dc.  Final next level of care: Skilled Nursing Facility Barriers to Discharge: Barriers Resolved   Patient Goals and CMS Choice Patient states their goals for this hospitalization and ongoing recovery are:: Go to SNF if PT recommends CMS Medicare.gov Compare Post Acute Care list provided to:: Patient Represenative (must comment) Choice offered to / list presented to : Adult Children  Discharge Placement              Patient chooses bed at: Other - please specify in the comment section below: ([facility]) Patient to be transferred to facility by: [facility] Name of family member notified: Darren Howard Patient and family notified of of transfer: 03/01/20  Discharge Plan and Services In-house Referral: NA Discharge Planning Services: NA Post Acute Care Choice: [place]          DME Arranged: N/A DME Agency: NA                  Social Determinants of Health (SDOH) Interventions     Readmission Risk Interventions Readmission Risk Prevention Plan 02/27/2020 01/26/2020 01/23/2020  Post Dischage Appt - - -  Medication Screening - - -  Transportation Screening Complete Complete Complete  PCP or Specialist Appt within 5-7 Days - - -  PCP or Specialist Appt within 3-5 Days - Complete Complete  Home Care Screening - - -  Medication Review (RN CM) - - -  [place] or Home Care Consult Complete Complete Complete  Social Work Consult for [place] Planning/Counseling Complete Complete Complete  Palliative Care Screening Not Applicable Not Applicable Not Applicable  Medication Review Press photographer) Complete Complete Complete  Some recent data might be hidden

## 2020-03-01 NOTE — Procedures (Signed)
PreOperative Dx: Cirrhosis, ascites °Postoperative Dx: Cirrhosis, ascites °Procedure:   US guided paracentesis °Radiologist:  Zhavia Cunanan °Anesthesia:  10 ml of1% lidocaine °Specimen:  4 L of yellow ascitic fluid °EBL:   < 1 ml °Complications: None   °

## 2020-03-01 NOTE — Progress Notes (Signed)
Report called,and given to April LPN. Vital signs stable,no c/o pain or discomfort noted.PICC line removed by SWAT nurse Arita Miss RN.Called patient. Patient to be transported by [facility] transport to awaiting facility.Marland Kitchen

## 2020-03-01 NOTE — Discharge Summary (Signed)
Darren Howard, is a 70 y.o. male  DOB 1950/11/27  MRN [id number].  Admission date:  02/24/2020  Admitting Physician  Murlean Iba, MD  Discharge Date:  03/01/2020   Primary MD  [name], MD  Recommendations for primary care physician for things to follow:   1) repeat CBC and BMP blood test every Monday starting 03/05/2020 for the next request 2)Avoid ibuprofen/Advil/Aleve/Motrin/Goody Powders/Naproxen/BC powders/Meloxicam/Diclofenac/Indomethacin and other Nonsteroidal anti-inflammatory medications as these will make you more likely to bleed and can cause stomach ulcers, can also cause Kidney problems.  3) you will most likely need palliative/therapeutic paracentensis to relieve your ascites from time to time most likely every 1 to 2 weeks  Admission Diagnosis  Hepatic encephalopathy ([place]) [K72.90] Hyperammonemia ([place]) [E72.20] Acute encephalopathy [G93.40] Ascites [R18.8] Altered mental status, unspecified altered mental status type [R41.82]   Discharge Diagnosis  Hepatic encephalopathy ([place]) [K72.90] Hyperammonemia ([place]) [E72.20] Acute encephalopathy [G93.40] Ascites [R18.8] Altered mental status, unspecified altered mental status type [R41.82]    Principal Problem:   Acute encephalopathy Active Problems:   AKI (acute kidney injury) ([place])   Hepatorenal syndrome (HCC)   Alcoholic cirrhosis of liver with ascites ([place])   Sepsis ([place])   Hyperammonemia (HCC)   Hepatic encephalopathy ([place])      Past Medical History:  Diagnosis Date  . Alcohol abuse   . Anemia   . DM2 (diabetes mellitus, type 2) ([place]) 02/13/2014  . Duodenal erosion   . Erosive gastropathy   . GI bleed 04/2018  . Glucose intolerance (impaired glucose tolerance)   . Hyperlipemia   . Hypertension   . Noncompliance     Past Surgical History:  Procedure Laterality Date  . BIOPSY  05/17/2018   Procedure: BIOPSY;   Surgeon: Daneil Dolin, MD;  [facility];  Service: Endoscopy;;  . BIOPSY  06/15/2019   Procedure: BIOPSY;  Surgeon: Daneil Dolin, MD;  [facility];  Service: Endoscopy;;  . BIOPSY  01/21/2020   Procedure: BIOPSY;  Surgeon: Harvel Quale, MD;  [facility];  Service: Gastroenterology;;  duodenal polyp;  . CARDIAC CATHETERIZATION    . COLONOSCOPY N/A 06/15/2019   Procedure: COLONOSCOPY;  Surgeon: Daneil Dolin, MD;  [facility];  Service: Endoscopy;  Laterality: N/A;  . COLONOSCOPY WITH PROPOFOL N/A 06/01/2018   inadequate colon prep, hemorrhoids on perianal exam, two 4-6 mm polyps at hepatic flexure, one 18 mm polyp at IC valve s/p piecemeal removal (tubular adenomas). was to have surveillance colonoscopy in 6 months but did not return to the office  . COLONOSCOPY WITH PROPOFOL N/A 01/21/2020   Procedure: COLONOSCOPY WITH PROPOFOL;  Surgeon: Harvel Quale, MD;  [facility];  Service: Gastroenterology;  Laterality: N/A;  . ESOPHAGOGASTRODUODENOSCOPY (EGD) WITH PROPOFOL N/A 05/17/2018   Dr. Gala Romney: normal esophagus, erosive gastropathy s/p biopsy, duodenal erosions, felt to be NSAID effect  . ESOPHAGOGASTRODUODENOSCOPY (EGD) WITH PROPOFOL N/A 01/21/2020   Procedure: ESOPHAGOGASTRODUODENOSCOPY (EGD) WITH PROPOFOL;  Surgeon: Montez Morita,  Quillian Quince, MD;  [facility];  Service: Gastroenterology;  Laterality: N/A;  . HERNIA REPAIR    . POLYPECTOMY  06/01/2018   Procedure: POLYPECTOMY;  Surgeon: Daneil Dolin, MD;  [facility];  Service: Endoscopy;;  colon      HPI  from the history and physical done on the day of admission:   HPI: Darren Howard is a 70 y.o. male with medical history significant of prior EtOH abuse in past, HTN, cirrhosis.  Pt presents to the ED with AMS.  EMS was apparently called to patients house: on arrival to house there was no heat on in the house and patient  was in his own excrement.  Pt noted to have AMS, brought in to ED.  Patient unable to provide further history or ROS secondary to AMS.   ED Course: WBC 14k, Creat 4.22, ammonia 143, INR 1.7.  HGB 8.0 (9.4 last month).  Lactate 3.1.  T 96.8, HR 108.  Pt treated initially as a suspected sepsis.  Given empiric cefepime / vanc  UA neg.  CXR neg.  Ct abd/pelvis w/o contrast: 1) Mod ascites 2) no bowel obstruction 3) No mention of hernia but they note "There is extension of ascitic fluid into the inguinal canals bilaterally." (presumably the cause of his inguinal swelling.        Hospital Course:   Brief Admission History:  70 year old male with history of alcohol abuse hypertension and liver cirrhosis presented to the emergency department with acute altered mental status changes.  He was brought by EMS and they found him living in a home with no heat and lying in his own excrement.  He was admitted with hepatic encephalopathy.  Assessment & Plan:   Principal Problem:   Acute encephalopathy Active Problems:   AKI (acute kidney injury) ([place])   Hepatorenal syndrome (HCC)   Alcoholic cirrhosis of liver with ascites (HCC)   Sepsis (HCC)   Hyperammonemia (HCC)   Hepatic encephalopathy ([place])  1. Hepatic encephalopathy-RESOLVED.  Patient has responded well to the lactulose treatments and ammonia level is trending down.  His mentation is improved.  He cannot go home without an evaluation by social services as he was living in squalor. 2. Hyperammonemia-improved with lactulose treatments. 3. Stage 4 CKD - this appears to be progression of chronic kidney disease and not acute injury--creatinine is currently around 4, weekly BMP test advised, will probably need outpatient follow-up with nephrology 4. Metabolic acidosis - resolved with  bicarbonate infusion.,  Discharged on bicarbonate tablets 5. Liver cirrhosis with recurrent ascites- recurrent episodes of hepatic  encephalopathy, requiring pressure sensation from time to time, outpatient follow-up with GI service advised--Lasix and Aldactone as ordered 6. Hypokalemia /Hypomagnesemia -due to diuretics and GI losses replacement given.  Repeat BMP every Monday 7. 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  Transfused 1 unit PRBC.  Hemoglobin stable above 8 8. Ascites- ultrasound paracentesis ordered with sample sent for fluid collection.  SBP ruled out.  Please see #5 above 9. Generalized weakness/deconditioning and poor living conditions---  transfer to SNF Rehab  DVT prophylaxis: SCDs Code Status: Full Family Communication:  Disposition:  SNF rehab  Dispo: The patient is from: Home  Anticipated d/c is to: SNF    Consultants:   IR for para  Procedures:    US paracentesis on 02/27/20 and 03/01/20   Discharge Condition: Stable  Follow UP   Contact information for after-discharge care    [facility] .   Service: Skilled Nursing Contact information: [address] [place] [phone]                   Consults obtained -   Diet and Activity recommendation:  As advised  Discharge Instructions    Discharge Instructions    Call MD for:  difficulty breathing, headache or visual disturbances   Complete by: As directed    Call MD for:  persistant dizziness or light-headedness   Complete by: As directed    Call MD for:  persistant nausea and vomiting   Complete by: As directed    Call MD for:  temperature >100.4   Complete by: As directed    Diet - low sodium heart healthy   Complete by: As directed    Discharge instructions   Complete by: As directed    1) repeat CBC and BMP blood test every Monday starting 03/05/2020 for the next request 2)Avoid  ibuprofen/Advil/Aleve/Motrin/Goody Powders/Naproxen/BC powders/Meloxicam/Diclofenac/Indomethacin and other Nonsteroidal anti-inflammatory medications as these will make you more likely to bleed and can cause stomach ulcers, can also cause Kidney problems.  3) you will most likely need palliative/therapeutic paracentensis to relieve your ascites from time to time most likely every 1 to 2 weeks   Increase activity slowly   Complete by: As directed       Discharge Medications     Allergies as of 03/01/2020   No Known Allergies     Medication List    TAKE these medications   carvedilol 3.125 MG tablet Commonly known as: COREG Take 1 tablet (3.125 mg total) by mouth 2 (two) times daily with a meal.   furosemide 20 MG tablet Commonly known as: LASIX Take 1 tablet (20 mg total) by mouth daily.   hydrocortisone 25 MG suppository Commonly known as: ANUSOL-HC Place 1 suppository (25 mg total) rectally 2 (two) times daily.   lactulose 10 GM/15ML solution Commonly known as: CHRONULAC Take 45 mLs (30 g total) by mouth 3 (three) times daily. What changed: when to take this   midodrine 2.5 MG tablet Commonly known as: PROAMATINE Take 3 tablets (7.5 mg total) by mouth 3 (three) times daily with meals.   pantoprazole 40 MG tablet Commonly known as: PROTONIX Take 1 tablet (40 mg total) by mouth daily.   saccharomyces boulardii 250 MG capsule Commonly known as: FLORASTOR Take 1 capsule (250 mg total) by mouth 2 (two) times daily.   sodium bicarbonate 650 MG tablet Take 1 tablet (650 mg total) by mouth 2 (two) times daily.   spironolactone 25 MG tablet Commonly known as: ALDACTONE Take 1 tablet (25 mg total) by mouth daily. What changed:   medication strength  how much to take   thiamine 100 MG tablet Take 1 tablet (100 mg total) by mouth daily. Start taking on: March 02, 2020       Major procedures and Radiology Reports - PLEASE review detailed and final reports for all  details, in brief -   CT Abdomen Pelvis Wo Contrast  Result Date: 02/24/2020 CLINICAL DATA:  70 year old male with altered mental status. Abdominal distension. EXAM: CT ABDOMEN AND PELVIS WITHOUT CONTRAST TECHNIQUE: Multidetector CT imaging of the abdomen and pelvis was performed following the standard protocol without IV contrast. COMPARISON:  CT of the pelvis dated 01/18/2020. FINDINGS: Evaluation of this exam is limited  in the absence of intravenous contrast. Evaluation is also limited due to streak artifact caused by support wires. Lower chest: The visualized lung bases are clear. No intra-abdominal free air. Moderate ascites. Hepatobiliary: The liver is grossly unremarkable. No calcified gallstone. Pancreas: The pancreas is atrophic Spleen: Normal in size without focal abnormality. Adrenals/Urinary Tract: The adrenal glands unremarkable. There is no hydronephrosis or nephrolithiasis on either side. Right renal cysts measure up to 3 cm in the inferior pole the right kidney. The urinary bladder is minimally distended and grossly unremarkable Stomach/Bowel: Evaluation of the bowel is limited in the absence of oral contrast and due to ascites. There is no bowel obstruction. Vascular/Lymphatic: Mild aortoiliac atherosclerotic disease. The IVC is unremarkable. No portal venous gas. There is no adenopathy. Reproductive: The prostate and seminal vesicles are grossly unremarkable. Other: None Musculoskeletal: Degenerative changes of the spine. No acute osseous pathology. There is extension of ascitic fluid into the inguinal canals bilaterally. IMPRESSION: 1. Moderate ascites. 2. No hydronephrosis or nephrolithiasis. 3. No evidence of bowel obstruction. 4. Aortic Atherosclerosis (ICD10-I70.0). Electronically Signed   By: Anner Crete M.D.   On: 02/24/2020 23:35   CT Head Wo Contrast  Result Date: 02/24/2020 CLINICAL DATA:  70 year old male with altered mental status. EXAM: CT HEAD WITHOUT CONTRAST TECHNIQUE:  Contiguous axial images were obtained from the base of the skull through the vertex without intravenous contrast. COMPARISON:  Head CT dated 05/13/2018. FINDINGS: Evaluation of this exam is limited due to motion artifact. Brain: Mild age-related atrophy and chronic microvascular ischemic changes. Old bilateral basal ganglia lacunar infarcts noted. There is no acute intracranial hemorrhage. No mass effect midline shift no extra-axial fluid collection. Vascular: No hyperdense vessel or unexpected calcification. Skull: Normal. Negative for fracture or focal lesion. Sinuses/Orbits: The visualized paranasal sinuses are clear. Right mastoid effusion. The left mastoid air cells are clear. Cerumen noted in the right external auditory canal. Other: None IMPRESSION: 1. No acute intracranial pathology. 2. Age-related atrophy and chronic microvascular ischemic changes. Old bilateral basal ganglia lacunar infarcts. Electronically Signed   By: Anner Crete M.D.   On: 02/24/2020 21:14   US RENAL  Result Date: 02/27/2020 CLINICAL DATA:  Inpatient.  Chronic kidney disease. EXAM: RENAL / URINARY TRACT ULTRASOUND COMPLETE COMPARISON:  02/24/2020 CT abdomen/pelvis. FINDINGS: Right Kidney: Renal measurements: 11.3 x 4.8 x 5.3 cm = volume: 150 mL. Echogenic right renal parenchyma, normal thickness. No hydronephrosis. Simple 2.7 x 2.4 x 2.2 cm upper right renal cyst. Simple 3.4 x 3.3 x 4.0 cm lower right renal cyst. Left Kidney: Renal measurements: 10.1 x 4.6 x 4.6 cm = volume: 112 mL. Echogenic left renal parenchyma, normal thickness. No hydronephrosis. No left renal mass. Bladder: Appears normal for degree of bladder distention. Other: Large volume ascites incidentally noted. IMPRESSION: 1. No hydronephrosis. 2. Echogenic normal size kidneys, compatible with reported nonspecific chronic renal parenchymal disease. 3. Simple right renal cysts. 4. Normal bladder. 5. Incidental large volume ascites. Electronically Signed   By: Ilona Sorrel M.D.   On: 02/27/2020 10:52   US Paracentesis  Result Date: 02/27/2020 INDICATION: Cirrhosis, ascites EXAM: ULTRASOUND GUIDED DIAGNOSTIC AND THERAPEUTIC PARACENTESIS MEDICATIONS: None COMPLICATIONS: None immediate PROCEDURE: Informed written consent was obtained from the patient after a discussion of the risks, benefits and alternatives to treatment. A timeout was performed prior to the initiation of the procedure. Initial ultrasound scanning demonstrates a large amount of ascites within the right lower abdominal quadrant. The right lower abdomen was prepped and draped in the  usual sterile fashion. 1% lidocaine was used for local anesthesia. Following this, a 5 [place] Yueh catheter was introduced. An ultrasound image was saved for documentation purposes. The paracentesis was performed. The catheter was removed and a dressing was applied. The patient tolerated the procedure well without immediate post procedural complication. Patient received post-procedure intravenous albumin; see nursing notes for details. FINDINGS: A total of approximately 6.2 L of yellow ascitic fluid was removed. Samples were sent to the laboratory as requested by the clinical team. IMPRESSION: Successful ultrasound-guided paracentesis yielding 6.2 liters of peritoneal fluid. Electronically Signed   By: Lavonia Dana M.D.   On: 02/27/2020 11:40   DG Chest Portable 1 View  Result Date: 02/27/2020 CLINICAL DATA:  Placement EXAM: PORTABLE CHEST 1 VIEW COMPARISON:  02/24/2020 FINDINGS: The heart size and mediastinal contours are within normal limits. Interval placement of right neck vascular catheter, tip projecting near the superior cavoatrial junction. Both lungs are clear. The visualized skeletal structures are unremarkable. IMPRESSION: Interval placement of right neck vascular catheter, tip projecting near the superior cavoatrial junction. Electronically Signed   By: Eddie Candle M.D.   On: 02/27/2020 12:52   DG Chest Port 1  View  Result Date: 02/24/2020 CLINICAL DATA:  Altered mental status. EXAM: PORTABLE CHEST 1 VIEW COMPARISON:  01/18/2020 FINDINGS: The heart size and mediastinal contours are within normal limits. Both lungs are clear. The visualized skeletal structures are unremarkable. IMPRESSION: No active disease. Electronically Signed   By: Marlaine Hind M.D.   On: 02/24/2020 19:41    Micro Results   Recent Results (from the past 240 hour(s))  Culture, blood (Routine X 2) w Reflex to ID Panel     Status: None   Collection Time: 02/24/20  7:40 PM   Specimen: BLOOD RIGHT FOREARM  Result Value Ref Range Status   Specimen Description BLOOD RIGHT FOREARM  Final   Special Requests   Final    BOTTLES DRAWN AEROBIC AND ANAEROBIC Blood Culture results may not be optimal due to an inadequate volume of blood received in culture bottles   Culture   Final    NO GROWTH 5 DAYS Performed at [facility], [address], [place], [place] [address]    Report Status 02/29/2020 FINAL  Final  Culture, blood (Routine X 2) w Reflex to ID Panel     Status: None   Collection Time: 02/24/20  7:43 PM   Specimen: Left Antecubital; Blood  Result Value Ref Range Status   Specimen Description LEFT ANTECUBITAL  Final   Special Requests   Final    BOTTLES DRAWN AEROBIC AND ANAEROBIC Blood Culture results may not be optimal due to an inadequate volume of blood received in culture bottles   Culture   Final    NO GROWTH 5 DAYS Performed at [facility], [address] [place], [place] [address]    Report Status 02/29/2020 FINAL  Final  SARS CORONAVIRUS 2 (TAT 6-24 HRS) Nasopharyngeal Nasopharyngeal Swab     Status: None   Collection Time: 02/25/20 12:19 AM   Specimen: Nasopharyngeal Swab  Result Value Ref Range Status   SARS Coronavirus 2 NEGATIVE NEGATIVE Final    Comment: (NOTE) SARS-CoV-2 target nucleic acids are NOT DETECTED.  The SARS-CoV-2 RNA is generally detectable in upper and lower respiratory specimens during the  acute phase of infection. Negative results do not preclude SARS-CoV-2 infection, do not rule out co-infections with other pathogens, and should not be used as the sole basis for treatment or  other patient management decisions. Negative results must be combined with clinical observations, patient history, and epidemiological information. The expected result is Negative.  Fact Sheet for Patients: SugarRoll.be  Fact Sheet for Healthcare Providers: https://www.woods-mathews.com/  This test is not yet approved or cleared by the [place] FDA and  has been authorized for detection and/or diagnosis of SARS-CoV-2 by FDA under an Emergency Use Authorization (EUA). This EUA will remain  in effect (meaning this test can be used) for the duration of the COVID-19 declaration under Se ction 564(b)(1) of the Act, 21 U.S.C. section 360bbb-3(b)(1), unless the authorization is terminated or revoked sooner.  Performed at [facility], [place] [address], [place], [place] [address]   Resp Panel by RT-PCR (Flu A&B, Covid) Nasopharyngeal Swab     Status: None   Collection Time: 02/25/20  5:46 AM   Specimen: Nasopharyngeal Swab; Nasopharyngeal(NP) swabs in vial transport medium  Result Value Ref Range Status   SARS Coronavirus 2 by RT PCR NEGATIVE NEGATIVE Final    Comment: (NOTE) SARS-CoV-2 target nucleic acids are NOT DETECTED.  The SARS-CoV-2 RNA is generally detectable in upper respiratory specimens during the acute phase of infection. The lowest concentration of SARS-CoV-2 viral copies this assay can detect is 138 copies/mL. A negative result does not preclude SARS-Cov-2 infection and should not be used as the sole basis for treatment or other patient management decisions. A negative result may occur with  improper specimen collection/handling, submission of specimen other than nasopharyngeal swab, presence of viral mutation(s) within the areas  targeted by this assay, and inadequate number of viral copies(<138 copies/mL). A negative result must be combined with clinical observations, patient history, and epidemiological information. The expected result is Negative.  Fact Sheet for Patients:  EntrepreneurPulse.com.au  Fact Sheet for Healthcare Providers:  IncredibleEmployment.be  This test is no t yet approved or cleared by the [place] FDA and  has been authorized for detection and/or diagnosis of SARS-CoV-2 by FDA under an Emergency Use Authorization (EUA). This EUA will remain  in effect (meaning this test can be used) for the duration of the COVID-19 declaration under Section 564(b)(1) of the Act, 21 U.S.C.section 360bbb-3(b)(1), unless the authorization is terminated  or revoked sooner.       Influenza A by PCR NEGATIVE NEGATIVE Final   Influenza B by PCR NEGATIVE NEGATIVE Final    Comment: (NOTE) The Xpert Xpress SARS-CoV-2/FLU/RSV plus assay is intended as an aid in the diagnosis of influenza from Nasopharyngeal swab specimens and should not be used as a sole basis for treatment. Nasal washings and aspirates are unacceptable for Xpert Xpress SARS-CoV-2/FLU/RSV testing.  Fact Sheet for Patients: EntrepreneurPulse.com.au  Fact Sheet for Healthcare Providers: IncredibleEmployment.be  This test is not yet approved or cleared by the [place] FDA and has been authorized for detection and/or diagnosis of SARS-CoV-2 by FDA under an Emergency Use Authorization (EUA). This EUA will remain in effect (meaning this test can be used) for the duration of the COVID-19 declaration under Section 564(b)(1) of the Act, 21 U.S.C. section 360bbb-3(b)(1), unless the authorization is terminated or revoked.  Performed at [facility], [address], [place],  [address]   Culture, body fluid-bottle     Status: None (Preliminary result)    Collection Time: 02/27/20 10:30 AM   Specimen: Ascitic  Result Value Ref Range Status   Specimen Description ASCITIC  Final   Special Requests 10CC  Final   Culture   Final    NO  GROWTH 3 DAYS Performed at [facility], [address], [place], [place] [address]    Report Status PENDING  Incomplete  Gram stain     Status: None   Collection Time: 02/27/20 11:00 AM   Specimen: Ascitic  Result Value Ref Range Status   Specimen Description ASCITIC  Final   Special Requests NONE  Final   Gram Stain   Final    ASCITIES FLUID NO ORGANISMS SEEN PRIMARILY MONONUMCLEA WBCS PRESENT Performed at [facility], [address], [place], [place] [address]    Report Status 02/27/2020 FINAL  Final    Today   Subjective    Darren Howard today has no new complaints, more coherent, had BM, eating and drinking fair      Patient has been seen and examined prior to discharge   Objective   Blood pressure 132/74, pulse 87, temperature 97.8 F (36.6 C), temperature source Oral, resp. rate 16, height 5\' 10"  (1.778 m), weight 68 kg, SpO2 100 %.   Intake/Output Summary (Last 24 hours) at 03/01/2020 1121 Last data filed at 03/01/2020 0500 Gross per 24 hour  Intake 780 ml  Output -  Net 780 ml    Exam Gen:- Awake Alert, no acute distress, chronically ill-appearing HEENT:- [place].AT, No sclera icterus Neck-Supple Neck,No JVD,.  Lungs-  CTAB , good air movement bilaterally  CV- S1, S2 normal, regular Abd-  +ve B.Sounds, Abd Soft, No tenderness, ascites noted Extremity/Skin:- No  edema,   good pulses Psych-affect is appropriate, oriented x3, a lot more coherent Neuro-generalized weakness, no new focal deficits, no tremors    Data Review   CBC w Diff:  Lab Results  Component Value Date   WBC 7.1 02/29/2020   HGB 8.1 (L) 02/29/2020   HCT 22.6 (L) 02/29/2020   PLT 105 (L) 02/29/2020   LYMPHOPCT 10 02/24/2020   MONOPCT 10 02/24/2020   EOSPCT 0 02/24/2020   BASOPCT 0 02/24/2020    CMP:   Lab Results  Component Value Date   NA 136 03/01/2020   K 3.7 03/01/2020   CL 106 03/01/2020   CO2 22 03/01/2020   BUN 38 (H) 03/01/2020   CREATININE 4.01 (H) 03/01/2020   PROT 5.8 (L) 03/01/2020   ALBUMIN 2.1 (L) 03/01/2020   BILITOT 2.3 (H) 03/01/2020   ALKPHOS 55 03/01/2020   AST 26 03/01/2020   ALT 16 03/01/2020  .   Total Discharge time is about 33 minutes  Roxan Hockey M.D on 03/01/2020 at 11:21 AM  Go to [email or web address] -  for contact info  [facility] - Office  [phone]

## 2020-03-01 NOTE — Discharge Instructions (Signed)
1) repeat CBC and BMP blood test every Monday starting 03/05/2020 for the next request 2)Avoid ibuprofen/Advil/Aleve/Motrin/Goody Powders/Naproxen/BC powders/Meloxicam/Diclofenac/Indomethacin and other Nonsteroidal anti-inflammatory medications as these will make you more likely to bleed and can cause stomach ulcers, can also cause Kidney problems.  3) you will most likely need palliative/therapeutic paracentensis to relieve your ascites from time to time most likely every 1 to 2 weeks

## 2020-03-01 NOTE — Sedation Documentation (Signed)
Patient tolerated left sided paracentesis well today and 4 Liters of clear yellow fluid removed and vital signs remained stable at completion of procedure. PT verbalized understanding of post procedure instructions and taken back via stretcher to inpatient bed assignment by transporter at this time.

## 2020-03-01 NOTE — Progress Notes (Signed)
Nsg Discharge Note  Admit Date:  02/24/2020 Discharge date: 03/01/2020   Darren Howard to be D/C'd Nursing Home per MD order.  AVS completed.  Copy for chart, and copy for patient signed, and dated. Patient/caregiver able to verbalize understanding.  Discharge Medication: Allergies as of 03/01/2020   No Known Allergies     Medication List    TAKE these medications   carvedilol 3.125 MG tablet Commonly known as: COREG Take 1 tablet (3.125 mg total) by mouth 2 (two) times daily with a meal.   furosemide 20 MG tablet Commonly known as: LASIX Take 1 tablet (20 mg total) by mouth daily.   hydrocortisone 25 MG suppository Commonly known as: ANUSOL-HC Place 1 suppository (25 mg total) rectally 2 (two) times daily.   lactulose 10 GM/15ML solution Commonly known as: CHRONULAC Take 45 mLs (30 g total) by mouth 3 (three) times daily. What changed: when to take this   midodrine 2.5 MG tablet Commonly known as: PROAMATINE Take 3 tablets (7.5 mg total) by mouth 3 (three) times daily with meals.   pantoprazole 40 MG tablet Commonly known as: PROTONIX Take 1 tablet (40 mg total) by mouth daily.   saccharomyces boulardii 250 MG capsule Commonly known as: FLORASTOR Take 1 capsule (250 mg total) by mouth 2 (two) times daily.   sodium bicarbonate 650 MG tablet Take 1 tablet (650 mg total) by mouth 2 (two) times daily.   spironolactone 25 MG tablet Commonly known as: ALDACTONE Take 1 tablet (25 mg total) by mouth daily. What changed:   medication strength  how much to take   thiamine 100 MG tablet Take 1 tablet (100 mg total) by mouth daily. Start taking on: March 02, 2020       Discharge Assessment: Vitals:   03/01/20 1327 03/01/20 1425  BP: (!) 150/79 122/74  Pulse: (!) 103 86  Resp: 17 17  Temp: 98.1 F (36.7 C) 98.5 F (36.9 C)  SpO2: 100% 100%   Skin clean, dry and intact without evidence of skin break down, no evidence of skin tears noted. IV catheter  discontinued intact. Site without signs and symptoms of complications - no redness or edema noted at insertion site, patient denies c/o pain - only slight tenderness at site.  Dressing with slight pressure applied.  D/c Instructions-Education: Discharge instructions given to patient/family with verbalized understanding. D/c education completed with patient/family including follow up instructions, medication list, d/c activities limitations if indicated, with other d/c instructions as indicated by MD - patient able to verbalize understanding, all questions fully answered. Patient instructed to return to ED, call 911, or call MD for any changes in condition.  Patient escorted via [place], and D/C home via private auto.  Darren Mcmurray, LPN 9/70/2637 8:58 PM

## 2020-03-03 LAB — CULTURE, BODY FLUID W GRAM STAIN -BOTTLE: Culture: NO GROWTH

## 2020-03-07 DIAGNOSIS — R4182 Altered mental status, unspecified: Secondary | ICD-10-CM

## 2020-03-08 NOTE — Progress Notes (Signed)
Unable to connect with patient to complete virtual visit today.  He was scheduled for hospital follow-up. Recently discharged to [facility] skilled nursing facility he was unaware of patient's office visit today. I did speak with his nurse to verify medications, ask about alarm symptoms, and requested labs to be updated.  See separate phone note documentation.   No charge today.   Aliene Altes, PA-C [facility]

## 2020-03-09 ENCOUNTER — Telehealth: Payer: Self-pay | Admitting: Gastroenterology

## 2020-03-09 ENCOUNTER — Telehealth (INDEPENDENT_AMBULATORY_CARE_PROVIDER_SITE_OTHER): Payer: PPO | Admitting: Gastroenterology

## 2020-03-09 ENCOUNTER — Other Ambulatory Visit: Payer: Self-pay

## 2020-03-09 VITALS — BP 130/78 | HR 88 | Temp 98.0°F | Resp 18 | Ht 70.0 in | Wt 160.0 lb

## 2020-03-09 DIAGNOSIS — K7031 Alcoholic cirrhosis of liver with ascites: Secondary | ICD-10-CM

## 2020-03-09 NOTE — Telephone Encounter (Signed)
I was not able to connect with patient today.  He was recently discharged from the hospital on [phone] to [place] facility.  I did connect with his nurse, Jacquenette Shone, who stated they were not aware that patient was scheduled for an office visit today.  She was not able to get patient to stay awake this morning for his visit as he was sleeping.  Stated, "he is not a morning person." States he is usually alert and oriented x3.  He continues to be weak and is undergoing rehabilitation.  The plan is to complete rehabilitation and then go home.  States he is supposed to have CBC and BMP weekly, but she cannot see any labs in the system.   I did review medications with his nurse. Relevant medications include: Anusol suppositories, Protonix 40 mg daily, lactulose 45 mL 3 times daily, lasix 20 mg daily, spironolactone 25 mg daily, and coreg 3.125 mg BID.   States patient has not had any GI complaints.  Nurse states there was a small amount of bright red blood in his stool 2 days ago. Unable to tell me how many BMs per day patient is having. He does not complain of rectal pain or abdominal pain.  He has been using Anusol suppositories.  No black tarry stools.  Nurse states his abdomen is not distended and he has no lower extremity edema.  States the whites of his eyes are yellow.  He has not had any nausea or vomiting and is eating about 50% of his meals.  He does feed himself, but usually will do better if someone helps him.  Palliative care is not following with him.  Recommendations:  1.  Increase Protonix to 40 mg twice daily due to esophagitis noted on EGD in December 2021 as it was recommended that patient take Protonix 40 mg twice daily x3 months. 2.  Continue other current medications.  3.  Lactulose should be titrated to have 3-4 BMs daily.  4.  Labs including CBC, BMP, HFP, INR, AFP, and ammonia. Nurse took verbal order. She will fax results to our office.  5.  Recommend getting  palliative medicine on board.  Nurse states she and the social worker will work on this. 6.  We need to arrange an in person office visit ASAP for follow-up. Ideally in the next 2-4 weeks.   Aliene Altes, PA-C [facility]  Erline Levine: Please arrange face to face office visit in the next 2-4 weeks with any APP or Dr. Gala Romney for follow-up of Cirrhosis.   Manuela Schwartz: Patient's nurse asked that we fax my above documentation to [facility] for their records. Fax #: [phone]. Attn: Jacquenette Shone.

## 2020-03-10 ENCOUNTER — Inpatient Hospital Stay (HOSPITAL_COMMUNITY)
Admission: EM | Admit: 2020-03-10 | Discharge: 2020-03-20 | DRG: 951 | Disposition: E | Payer: PPO | Attending: Internal Medicine | Admitting: Internal Medicine

## 2020-03-10 ENCOUNTER — Encounter (HOSPITAL_COMMUNITY): Payer: Self-pay

## 2020-03-10 ENCOUNTER — Other Ambulatory Visit: Payer: Self-pay

## 2020-03-10 DIAGNOSIS — I129 Hypertensive chronic kidney disease with stage 1 through stage 4 chronic kidney disease, or unspecified chronic kidney disease: Secondary | ICD-10-CM | POA: Diagnosis not present

## 2020-03-10 DIAGNOSIS — N184 Chronic kidney disease, stage 4 (severe): Secondary | ICD-10-CM | POA: Diagnosis present

## 2020-03-10 DIAGNOSIS — Z66 Do not resuscitate: Secondary | ICD-10-CM | POA: Diagnosis present

## 2020-03-10 DIAGNOSIS — E871 Hypo-osmolality and hyponatremia: Secondary | ICD-10-CM | POA: Diagnosis not present

## 2020-03-10 DIAGNOSIS — R404 Transient alteration of awareness: Secondary | ICD-10-CM | POA: Diagnosis not present

## 2020-03-10 DIAGNOSIS — R188 Other ascites: Secondary | ICD-10-CM | POA: Diagnosis present

## 2020-03-10 DIAGNOSIS — N179 Acute kidney failure, unspecified: Secondary | ICD-10-CM | POA: Diagnosis not present

## 2020-03-10 DIAGNOSIS — F1010 Alcohol abuse, uncomplicated: Secondary | ICD-10-CM | POA: Diagnosis not present

## 2020-03-10 DIAGNOSIS — K921 Melena: Secondary | ICD-10-CM | POA: Diagnosis not present

## 2020-03-10 DIAGNOSIS — R0902 Hypoxemia: Secondary | ICD-10-CM | POA: Diagnosis not present

## 2020-03-10 DIAGNOSIS — Z20822 Contact with and (suspected) exposure to covid-19: Secondary | ICD-10-CM | POA: Diagnosis present

## 2020-03-10 DIAGNOSIS — K7682 Hepatic encephalopathy: Secondary | ICD-10-CM

## 2020-03-10 DIAGNOSIS — F17210 Nicotine dependence, cigarettes, uncomplicated: Secondary | ICD-10-CM | POA: Diagnosis present

## 2020-03-10 DIAGNOSIS — D689 Coagulation defect, unspecified: Secondary | ICD-10-CM | POA: Diagnosis not present

## 2020-03-10 DIAGNOSIS — Z8711 Personal history of peptic ulcer disease: Secondary | ICD-10-CM

## 2020-03-10 DIAGNOSIS — K7290 Hepatic failure, unspecified without coma: Secondary | ICD-10-CM

## 2020-03-10 DIAGNOSIS — Z515 Encounter for palliative care: Principal | ICD-10-CM

## 2020-03-10 DIAGNOSIS — E878 Other disorders of electrolyte and fluid balance, not elsewhere classified: Secondary | ICD-10-CM

## 2020-03-10 DIAGNOSIS — R109 Unspecified abdominal pain: Secondary | ICD-10-CM | POA: Diagnosis present

## 2020-03-10 DIAGNOSIS — E785 Hyperlipidemia, unspecified: Secondary | ICD-10-CM | POA: Diagnosis not present

## 2020-03-10 DIAGNOSIS — K7031 Alcoholic cirrhosis of liver with ascites: Secondary | ICD-10-CM | POA: Diagnosis present

## 2020-03-10 DIAGNOSIS — E7220 Disorder of urea cycle metabolism, unspecified: Secondary | ICD-10-CM | POA: Diagnosis present

## 2020-03-10 DIAGNOSIS — K767 Hepatorenal syndrome: Secondary | ICD-10-CM | POA: Diagnosis present

## 2020-03-10 DIAGNOSIS — G9340 Encephalopathy, unspecified: Secondary | ICD-10-CM | POA: Diagnosis not present

## 2020-03-10 DIAGNOSIS — Z8249 Family history of ischemic heart disease and other diseases of the circulatory system: Secondary | ICD-10-CM | POA: Diagnosis not present

## 2020-03-10 DIAGNOSIS — E872 Acidosis, unspecified: Secondary | ICD-10-CM

## 2020-03-10 DIAGNOSIS — D631 Anemia in chronic kidney disease: Secondary | ICD-10-CM | POA: Diagnosis present

## 2020-03-10 DIAGNOSIS — K649 Unspecified hemorrhoids: Secondary | ICD-10-CM | POA: Diagnosis present

## 2020-03-10 DIAGNOSIS — Z79899 Other long term (current) drug therapy: Secondary | ICD-10-CM

## 2020-03-10 DIAGNOSIS — I10 Essential (primary) hypertension: Secondary | ICD-10-CM | POA: Diagnosis present

## 2020-03-10 DIAGNOSIS — K319 Disease of stomach and duodenum, unspecified: Secondary | ICD-10-CM | POA: Diagnosis present

## 2020-03-10 DIAGNOSIS — E1122 Type 2 diabetes mellitus with diabetic chronic kidney disease: Secondary | ICD-10-CM | POA: Diagnosis present

## 2020-03-10 DIAGNOSIS — K7011 Alcoholic hepatitis with ascites: Secondary | ICD-10-CM | POA: Diagnosis not present

## 2020-03-10 DIAGNOSIS — E119 Type 2 diabetes mellitus without complications: Secondary | ICD-10-CM

## 2020-03-10 DIAGNOSIS — R531 Weakness: Secondary | ICD-10-CM

## 2020-03-10 DIAGNOSIS — Z9119 Patient's noncompliance with other medical treatment and regimen: Secondary | ICD-10-CM | POA: Diagnosis not present

## 2020-03-10 DIAGNOSIS — R609 Edema, unspecified: Secondary | ICD-10-CM | POA: Diagnosis not present

## 2020-03-10 DIAGNOSIS — G9341 Metabolic encephalopathy: Secondary | ICD-10-CM | POA: Diagnosis not present

## 2020-03-10 DIAGNOSIS — Z720 Tobacco use: Secondary | ICD-10-CM | POA: Diagnosis present

## 2020-03-10 DIAGNOSIS — K7040 Alcoholic hepatic failure without coma: Secondary | ICD-10-CM | POA: Diagnosis not present

## 2020-03-10 LAB — COMPREHENSIVE METABOLIC PANEL
ALT: 22 U/L (ref 0–44)
AST: 44 U/L — ABNORMAL HIGH (ref 15–41)
Albumin: 2.3 g/dL — ABNORMAL LOW (ref 3.5–5.0)
Alkaline Phosphatase: 85 U/L (ref 38–126)
Anion gap: 13 (ref 5–15)
BUN: 83 mg/dL — ABNORMAL HIGH (ref 8–23)
CO2: 17 mmol/L — ABNORMAL LOW (ref 22–32)
Calcium: 8.2 mg/dL — ABNORMAL LOW (ref 8.9–10.3)
Chloride: 102 mmol/L (ref 98–111)
Creatinine, Ser: 8.26 mg/dL — ABNORMAL HIGH (ref 0.61–1.24)
GFR, Estimated: 7 mL/min — ABNORMAL LOW (ref >60)
Glucose, Bld: 114 mg/dL — ABNORMAL HIGH (ref 70–99)
Potassium: 4.7 mmol/L (ref 3.5–5.1)
Sodium: 132 mmol/L — ABNORMAL LOW (ref 135–145)
Total Bilirubin: 2.5 mg/dL — ABNORMAL HIGH (ref 0.3–1.2)
Total Protein: 6.8 g/dL (ref 6.5–8.1)

## 2020-03-10 LAB — CBC WITH DIFFERENTIAL/PLATELET
Abs Immature Granulocytes: 0.04 10*3/uL (ref 0.00–0.07)
Basophils Absolute: 0.1 10*3/uL (ref 0.0–0.1)
Basophils Relative: 1 %
Eosinophils Absolute: 0.2 10*3/uL (ref 0.0–0.5)
Eosinophils Relative: 2 %
HCT: 30.2 % — ABNORMAL LOW (ref 39.0–52.0)
Hemoglobin: 10.1 g/dL — ABNORMAL LOW (ref 13.0–17.0)
Immature Granulocytes: 0 %
Lymphocytes Relative: 27 %
Lymphs Abs: 2.5 10*3/uL (ref 0.7–4.0)
MCH: 31.4 pg (ref 26.0–34.0)
MCHC: 33.4 g/dL (ref 30.0–36.0)
MCV: 93.8 fL (ref 80.0–100.0)
Monocytes Absolute: 1.4 10*3/uL — ABNORMAL HIGH (ref 0.1–1.0)
Monocytes Relative: 15 %
Neutro Abs: 5 10*3/uL (ref 1.7–7.7)
Neutrophils Relative %: 55 %
Platelets: 176 10*3/uL (ref 150–400)
RBC: 3.22 MIL/uL — ABNORMAL LOW (ref 4.22–5.81)
RDW: 16 % — ABNORMAL HIGH (ref 11.5–15.5)
WBC: 9.2 10*3/uL (ref 4.0–10.5)
nRBC: 0 % (ref 0.0–0.2)

## 2020-03-10 LAB — AMMONIA: Ammonia: 187 umol/L — ABNORMAL HIGH (ref 9–35)

## 2020-03-10 LAB — PROTIME-INR
INR: 1.3 — ABNORMAL HIGH (ref 0.8–1.2)
Prothrombin Time: 15.5 seconds — ABNORMAL HIGH (ref 11.4–15.2)

## 2020-03-10 MED ORDER — LACTULOSE 10 GM/15ML PO SOLN
30.0000 g | Freq: Once | ORAL | Status: DC
  Filled 2020-03-10: qty 60

## 2020-03-10 MED ORDER — SODIUM CHLORIDE 0.9 % IV SOLN
1.0000 mg/h | INTRAVENOUS | Status: DC
  Administered 2020-03-10 – 2020-03-11 (×2): 1 mg/h via INTRAVENOUS
  Filled 2020-03-10 (×2): qty 2.5

## 2020-03-10 MED ORDER — HYDROMORPHONE HCL 1 MG/ML IJ SOLN
1.0000 mg | INTRAMUSCULAR | Status: DC | PRN
Start: 2020-03-10 — End: 2020-03-12
  Administered 2020-03-10: 1 mg via INTRAVENOUS
  Filled 2020-03-10: qty 1

## 2020-03-10 MED ORDER — SODIUM CHLORIDE 0.9 % IV BOLUS
1000.0000 mL | Freq: Once | INTRAVENOUS | Status: AC
  Administered 2020-03-10: 1000 mL via INTRAVENOUS

## 2020-03-10 MED ORDER — HYDROMORPHONE HCL 1 MG/ML IJ SOLN
INTRAMUSCULAR | Status: AC
  Administered 2020-03-10: 1 mg via INTRAVENOUS
  Filled 2020-03-10: qty 1

## 2020-03-10 MED ORDER — LORAZEPAM 2 MG/ML IJ SOLN: 1.0000 mg | INTRAMUSCULAR | Status: DC | PRN

## 2020-03-10 MED ORDER — LORAZEPAM 1 MG PO TABS: 1.0000 mg | ORAL_TABLET | ORAL | Status: DC | PRN

## 2020-03-10 MED ORDER — LORAZEPAM 2 MG/ML PO CONC: 1.0000 mg | ORAL | Status: DC | PRN

## 2020-03-10 MED ORDER — HYDROMORPHONE HCL PF 10 MG/ML IJ SOLN
INTRAMUSCULAR | Status: AC
  Filled 2020-03-10: qty 5

## 2020-03-10 NOTE — ED Provider Notes (Addendum)
[facility] EMERGENCY DEPARTMENT Provider Note  CSN: [id number] Arrival date & time: 03/16/2020 1622    History Chief Complaint  Patient presents with  . Abdominal Pain    HPI  Darren Howard is a 70 y.o. male with history of alcoholic cirrhosis recently admitted for encephalopathy and ascites. Mental status improved with lactulose and he had a paracentesis on 1/13. He originally was living at home but discharged to [facility] about a week ago. He was supposed to have a follow up visit with GI yesterday but this was not communicated to staff at [facility] and so he missed his appointment. There is a telephone note from the GI PA who spoke to staff at [facility] yesterday and they indicated he has not had abdominal distention but has been more sleepy in the morning recently. Today I spoke with staff at SNF who report family asked that the patient be sent to the ED due to worsening confusion, not eating and refusing meds and signs of abdominal discomfort. Patient was noted to have BRBPR at SNF yesterday and on arrival to the ED today with hemorrhoids. He is unable to provide any additional history. By report he is awake, alert and ambulatory at baseline.    Past Medical History:  Diagnosis Date  . Alcohol abuse   . Anemia   . DM2 (diabetes mellitus, type 2) ([place]) 02/13/2014  . Duodenal erosion   . Erosive gastropathy   . GI bleed 04/2018  . Glucose intolerance (impaired glucose tolerance)   . Hyperlipemia   . Hypertension   . Noncompliance     Past Surgical History:  Procedure Laterality Date  . BIOPSY  05/17/2018   Procedure: BIOPSY;  Surgeon: Daneil Dolin, MD;  [facility];  Service: Endoscopy;;  . BIOPSY  06/15/2019   Procedure: BIOPSY;  Surgeon: Daneil Dolin, MD;  [facility];  Service: Endoscopy;;  . BIOPSY  01/21/2020   Procedure: BIOPSY;  Surgeon: Harvel Quale, MD;  Location: [facility];  Service: Gastroenterology;;  duodenal polyp;  .  CARDIAC CATHETERIZATION    . COLONOSCOPY N/A 06/15/2019   Procedure: COLONOSCOPY;  Surgeon: Daneil Dolin, MD;  [facility];  Service: Endoscopy;  Laterality: N/A;  . COLONOSCOPY WITH PROPOFOL N/A 06/01/2018   inadequate colon prep, hemorrhoids on perianal exam, two 4-6 mm polyps at hepatic flexure, one 18 mm polyp at IC valve s/p piecemeal removal (tubular adenomas). was to have surveillance colonoscopy in 6 months but did not return to the office  . COLONOSCOPY WITH PROPOFOL N/A 01/21/2020   Procedure: COLONOSCOPY WITH PROPOFOL;  Surgeon: Harvel Quale, MD;  [facility];  Service: Gastroenterology;  Laterality: N/A;  . ESOPHAGOGASTRODUODENOSCOPY (EGD) WITH PROPOFOL N/A 05/17/2018   Dr. Gala Romney: normal esophagus, erosive gastropathy s/p biopsy, duodenal erosions, felt to be NSAID effect  . ESOPHAGOGASTRODUODENOSCOPY (EGD) WITH PROPOFOL N/A 01/21/2020   Procedure: ESOPHAGOGASTRODUODENOSCOPY (EGD) WITH PROPOFOL;  Surgeon: Harvel Quale, MD;  [facility];  Service: Gastroenterology;  Laterality: N/A;  . HERNIA REPAIR    . POLYPECTOMY  06/01/2018   Procedure: POLYPECTOMY;  Surgeon: Daneil Dolin, MD;  [facility];  Service: Endoscopy;;  colon    Family History  Problem Relation Age of Onset  . Congestive Heart Failure Mother   . Congestive Heart Failure Father   . Cancer Brother        unknown kind   . Colon cancer Neg  Hx   . Colon polyps Neg Hx     Social History   Tobacco Use  . Smoking status: Current Every Day Smoker    Packs/day: 0.50    Years: 30.00    Pack years: 15.00    Types: Cigarettes  . Smokeless tobacco: Never Used  . Tobacco comment: 2 cigarettes a day.  Vaping Use  . Vaping Use: Never used  Substance Use Topics  . Alcohol use: Not Currently    Alcohol/week: 2.0 standard drinks    Types: 2 Shots of liquor per week    Comment: denies  . Drug use: No     Home Medications Prior to Admission  medications   Medication Sig Start Date End Date Taking? Authorizing Provider  carvedilol (COREG) 3.125 MG tablet Take 1 tablet (3.125 mg total) by mouth 2 (two) times daily with a meal. 01/26/20   Kathie Dike, MD  furosemide (LASIX) 20 MG tablet Take 1 tablet (20 mg total) by mouth daily. 01/27/20   Kathie Dike, MD  hydrocortisone (ANUSOL-HC) 25 MG suppository Place 1 suppository (25 mg total) rectally 2 (two) times daily. Patient taking differently: Place 25 mg rectally 2 (two) times daily as needed. 01/26/20   Kathie Dike, MD  lactulose (CHRONULAC) 10 GM/15ML solution Take 45 mLs (30 g total) by mouth 3 (three) times daily. 03/01/20   Roxan Hockey, MD  midodrine (PROAMATINE) 2.5 MG tablet Take 3 tablets (7.5 mg total) by mouth 3 (three) times daily with meals. 01/26/20   Kathie Dike, MD  pantoprazole (PROTONIX) 40 MG tablet Take 1 tablet (40 mg total) by mouth daily. 03/01/20 03/31/20  Roxan Hockey, MD  saccharomyces boulardii (FLORASTOR) 250 MG capsule Take 1 capsule (250 mg total) by mouth 2 (two) times daily. 03/01/20   Roxan Hockey, MD  sodium bicarbonate 650 MG tablet Take 1 tablet (650 mg total) by mouth 2 (two) times daily. 03/01/20 03/01/21  Roxan Hockey, MD  spironolactone (ALDACTONE) 25 MG tablet Take 1 tablet (25 mg total) by mouth daily. 03/01/20   Roxan Hockey, MD  thiamine 100 MG tablet Take 1 tablet (100 mg total) by mouth daily. 03/02/20   Roxan Hockey, MD     Allergies    Patient has no known allergies.   Review of Systems   Review of Systems Unable to assess due to mental status.    Physical Exam BP 135/78   Pulse 91   Temp 98.9 F (37.2 C) (Axillary)   Resp 17   Ht '5\' 10"'$  (1.778 m)   Wt 72.6 kg   SpO2 100%   BMI 22.97 kg/m   Physical Exam Vitals and nursing note reviewed.  Constitutional:      Appearance: Normal appearance.  HENT:     Head: Normocephalic and atraumatic.     Nose: Nose normal.     Mouth/Throat:     Mouth:  Mucous membranes are moist.  Eyes:     Extraocular Movements: Extraocular movements intact.     Conjunctiva/sclera: Conjunctivae normal.  Cardiovascular:     Rate and Rhythm: Normal rate.  Pulmonary:     Effort: Pulmonary effort is normal.     Breath sounds: Normal breath sounds.  Abdominal:     General: Abdomen is protuberant. There is distension.     Palpations: Abdomen is soft. There is shifting dullness.     Tenderness: There is no abdominal tenderness. There is no guarding.  Musculoskeletal:        General: No swelling. Normal  range of motion.     Cervical back: Neck supple.  Skin:    General: Skin is warm and dry.  Neurological:     General: No focal deficit present.     Mental Status: He is alert. He is disoriented.  Psychiatric:        Mood and Affect: Mood normal.      ED Results / Procedures / Treatments   Labs (all labs ordered are listed, but only abnormal results are displayed) Labs Reviewed  COMPREHENSIVE METABOLIC PANEL - Abnormal; Notable for the following components:      Result Value   Sodium 132 (*)    CO2 17 (*)    Glucose, Bld 114 (*)    BUN 83 (*)    Creatinine, Ser 8.26 (*)    Calcium 8.2 (*)    Albumin 2.3 (*)    AST 44 (*)    Total Bilirubin 2.5 (*)    GFR, Estimated 7 (*)    All other components within normal limits  PROTIME-INR - Abnormal; Notable for the following components:   Prothrombin Time 15.5 (*)    INR 1.3 (*)    All other components within normal limits  CBC WITH DIFFERENTIAL/PLATELET - Abnormal; Notable for the following components:   RBC 3.22 (*)    Hemoglobin 10.1 (*)    HCT 30.2 (*)    RDW 16.0 (*)    Monocytes Absolute 1.4 (*)    All other components within normal limits  AMMONIA - Abnormal; Notable for the following components:   Ammonia 187 (*)    All other components within normal limits  SARS CORONAVIRUS 2 (TAT 6-24 HRS)    EKG None  Radiology No results found.  Procedures .Critical Care Performed by:  Truddie Hidden, MD Authorized by: Truddie Hidden, MD   Critical care provider statement:    Critical care time (minutes):  35   Critical care time was exclusive of:  Separately billable procedures and treating other patients   Critical care was necessary to treat or prevent imminent or life-threatening deterioration of the following conditions:  Hepatic failure   Critical care was time spent personally by me on the following activities:  Discussions with consultants, evaluation of patient's response to treatment, examination of patient, ordering and performing treatments and interventions, ordering and review of laboratory studies, ordering and review of radiographic studies, pulse oximetry, re-evaluation of patient's condition, obtaining history from patient or surrogate and review of old charts   Care discussed with: admitting provider      Medications Ordered in the ED Medications - No data to display   MDM Rules/Calculators/A&P MDM Patient with AMS, abdominal distention in setting of known liver disease. Will check labs, including CBC, CMP, INR and ammonia. Staff at SNF are aware that paracentesis is not available over the weekend.  ED Course  I have reviewed the triage vital signs and the nursing notes.  Pertinent labs & imaging results that were available during my care of the patient were reviewed by me and considered in my medical decision making (see chart for details).  Clinical Course as of 03/04/2020 2234  Sat Mar 10, 2020  1828 CBC with mild anemia at baseline, no leukocytosis or thrombocytopenia. His INR is mildly elevated improved from previous. His CMP shows marked worsening of his renal function and his ammonia is much higher than his baseline. Will begin IVF, lactulose and page hospitalist for admission.  [CS]  1901 Spoke with Hospitalist who  will come evaluate. May require transfer for more urgent paracentesis since that won't be available here for 2 more days.   [CS]    Clinical Course User Index [CS] Truddie Hidden, MD    Final Clinical Impression(s) / ED Diagnoses Final diagnoses:  Hepatic encephalopathy ([place])  AKI (acute kidney injury) ([place])  Ascites due to alcoholic cirrhosis [facility])    Rx / DC Orders ED Discharge Orders    None       Truddie Hidden, MD 02/21/2020 2234    Truddie Hidden, MD 03/26/20 [phone]

## 2020-03-10 NOTE — ED Triage Notes (Signed)
Patient sent from [facility] for abdominal swelling to have fluid "pulled from his liver." Noted with abdominal distention, firm to touch. Patient is not able to answer questions appropriately. Noted with swelling to groin area.

## 2020-03-10 NOTE — H&P (Addendum)
TRH H&P   Patient Demographics:    Darren Howard, is a 70 y.o. male  MRN: [id number]   DOB - 1951/02/04  Admit Date - 03/19/2020  Outpatient Primary MD for the patient is [name], MD  Referring MD/NP/PA: Dr Karle Starch  Patient coming from: Home  Chief Complaint  Patient presents with  . Abdominal Pain      HPI:    Darren Howard  is a 70 y.o. male, with past medical history significant for prior alcohol abuse in the past, hypertension, cirrhosis, with recent hospitalization at [facility] from 02/24/2020 >>03/01/2020 with hospital stay significant for acute encephalopathy due to hyperammonemia, and renal failure in the setting of hepatorenal syndrome, patient required large volume paracentesis x2 during that hospital stay, however he was discharged to SNF facility, with recommendation for palliative medicine follow-up at the facility, well supposed to follow with GI as an outpatient, and has not been followed by dilated at the facility, patient was brought to ED for further evaluation for worsening mental status and abdominal distention, not eating, refusing meds, and signs of abdominal discomfort, as well he was noted to have bright red blood per rectum at SNF facility yesterday, presentation to ED patient is lethargic, unable to provide any history, he is with significant ascites, significantly uncomfortable, I have discussed with son by phone, who was visiting his father, reports father clinical condition has been deteriorating over last few days. - in ED his exam was significant for, sodium of 132, creatinine increased from 4-8.26, BUN elevated at 83, with low bicarb at 17, ammonia significantly elevated at 187, total bili elevated at 2.5, INR elevated at 0.3, hemoglobin stable at 10, platelets stable at 176, [facility] hospitalist consulted to admit.    Review of systems:     Patient unable to provide any review of symptoms.  With Past History of the following :    Past Medical History:  Diagnosis Date  . Alcohol abuse   . Anemia   . DM2 (diabetes mellitus, type 2) ([place]) 02/13/2014  . Duodenal erosion   . Erosive gastropathy   . GI bleed 04/2018  . Glucose intolerance (impaired glucose tolerance)   . Hyperlipemia   . Hypertension   . Noncompliance       Past Surgical History:  Procedure Laterality Date  . BIOPSY  05/17/2018   Procedure: BIOPSY;  Surgeon: Daneil Dolin, MD;  [facility];  Service: Endoscopy;;  . BIOPSY  06/15/2019   Procedure: BIOPSY;  Surgeon: Daneil Dolin, MD;  [facility];  Service: Endoscopy;;  . BIOPSY  01/21/2020   Procedure: BIOPSY;  Surgeon: Harvel Quale, MD;  [facility];  Service: Gastroenterology;;  duodenal polyp;  . CARDIAC CATHETERIZATION    . COLONOSCOPY N/A 06/15/2019   Procedure: COLONOSCOPY;  Surgeon: Daneil Dolin, MD;  [facility];  Service: Endoscopy;  Laterality: N/A;  . COLONOSCOPY WITH PROPOFOL N/A 06/01/2018   inadequate colon prep, hemorrhoids on perianal exam, two 4-6 mm polyps at hepatic flexure, one 18 mm polyp at IC valve s/p piecemeal removal (tubular adenomas). was to have surveillance colonoscopy in 6 months but did not return to the office  . COLONOSCOPY WITH PROPOFOL N/A 01/21/2020   Procedure: COLONOSCOPY WITH PROPOFOL;  Surgeon: Harvel Quale, MD;  [facility];  Service: Gastroenterology;  Laterality: N/A;  . ESOPHAGOGASTRODUODENOSCOPY (EGD) WITH PROPOFOL N/A 05/17/2018   Dr. Gala Romney: normal esophagus, erosive gastropathy s/p biopsy, duodenal erosions, felt to be NSAID effect  . ESOPHAGOGASTRODUODENOSCOPY (EGD) WITH PROPOFOL N/A 01/21/2020   Procedure: ESOPHAGOGASTRODUODENOSCOPY (EGD) WITH PROPOFOL;  Surgeon: Harvel Quale, MD;  [facility];  Service: Gastroenterology;  Laterality: N/A;  .  HERNIA REPAIR    . POLYPECTOMY  06/01/2018   Procedure: POLYPECTOMY;  Surgeon: Daneil Dolin, MD;  [facility];  Service: Endoscopy;;  colon      Social History:     Social History   Tobacco Use  . Smoking status: Current Every Day Smoker    Packs/day: 0.50    Years: 30.00    Pack years: 15.00    Types: Cigarettes  . Smokeless tobacco: Never Used  . Tobacco comment: 2 cigarettes a day.  Substance Use Topics  . Alcohol use: Not Currently    Alcohol/week: 2.0 standard drinks    Types: 2 Shots of liquor per week    Comment: denies       Family History :     Family History  Problem Relation Age of Onset  . Congestive Heart Failure Mother   . Congestive Heart Failure Father   . Cancer Brother        unknown kind   . Colon cancer Neg Hx   . Colon polyps Neg Hx      Home Medications:   Prior to Admission medications   Medication Sig Start Date End Date Taking? Authorizing Provider  carvedilol (COREG) 3.125 MG tablet Take 1 tablet (3.125 mg total) by mouth 2 (two) times daily with a meal. 01/26/20  Yes Memon, Jolaine Artist, MD  furosemide (LASIX) 20 MG tablet Take 1 tablet (20 mg total) by mouth daily. 01/27/20  Yes Kathie Dike, MD  hydrocortisone (ANUSOL-HC) 25 MG suppository Place 1 suppository (25 mg total) rectally 2 (two) times daily. Patient taking differently: Place 25 mg rectally 2 (two) times daily as needed. 01/26/20  Yes Kathie Dike, MD  lactulose (CHRONULAC) 10 GM/15ML solution Take 45 mLs (30 g total) by mouth 3 (three) times daily. 03/01/20  Yes Emokpae, Courage, MD  midodrine (PROAMATINE) 2.5 MG tablet Take 3 tablets (7.5 mg total) by mouth 3 (three) times daily with meals. 01/26/20  Yes Kathie Dike, MD  saccharomyces boulardii (FLORASTOR) 250 MG capsule Take 1 capsule (250 mg total) by mouth 2 (two) times daily. 03/01/20  Yes Emokpae, Courage, MD  sodium bicarbonate 650 MG tablet Take 1 tablet (650 mg total) by mouth 2 (two) times daily.  03/01/20 03/01/21 Yes Emokpae, Courage, MD  spironolactone (ALDACTONE) 25 MG tablet Take 1 tablet (25 mg total) by mouth daily. 03/01/20  Yes Emokpae, Courage, MD  pantoprazole (PROTONIX) 40 MG tablet Take 1 tablet (40 mg total) by mouth daily. 03/01/20 03/31/20  Roxan Hockey, MD  thiamine 100 MG tablet Take 1 tablet (100 mg total) by mouth daily. 03/02/20   Roxan Hockey, MD  Allergies:    No Known Allergies   Physical Exam:   Vitals  Blood pressure (!) 159/112, pulse 90, temperature 98.9 F (37.2 C), temperature source Axillary, resp. rate 14, height '5\' 10"'$  (1.778 m), weight 72.6 kg, SpO2 96 %.   1. General extremely frail, elderly frail male, laying in bed with discomfort secondary to abdominal pain  2.  Patient  is lethargic, open eyes to verbal stimuli, but unable to answer any questions or follow any commands .  3.  Have appropriate neurological exams given his significant encephalopathy, but appears to be moving all extremities  4. Ears and Eyes appear Normal, Conjunctivae clear, PERRLA. Dry  Oral Mucosa.  5. Supple Neck, No JVD, No cervical lymphadenopathy appriciated, No Carotid Bruits.  6. Symmetrical Chest wall movement, diminished air entry at the bases  7. RRR, No Gallops, Rubs or Murmurs, No Parasternal Heave.  8.  Diminished significantly distended, tender to palpation, with significant ascites.   9.  No Cyanosis, delayed  Skin Turgor.  10.   joints appear normal , no effusions, with significant Muscle wasting.  11. No Palpable Lymph Nodes in Neck or Axillae    Data Review:    CBC Recent Labs  Lab 03/06/2020 1710  WBC 9.2  HGB 10.1*  HCT 30.2*  PLT 176  MCV 93.8  MCH 31.4  MCHC 33.4  RDW 16.0*  LYMPHSABS 2.5  MONOABS 1.4*  EOSABS 0.2  BASOSABS 0.1   ------------------------------------------------------------------------------------------------------------------  Chemistries  Recent Labs  Lab 02/29/2020 1710  NA 132*  K 4.7  CL 102   CO2 17*  GLUCOSE 114*  BUN 83*  CREATININE 8.26*  CALCIUM 8.2*  AST 44*  ALT 22  ALKPHOS 85  BILITOT 2.5*   ------------------------------------------------------------------------------------------------------------------ estimated creatinine clearance is 8.9 mL/min (A) (by C-G formula based on SCr of 8.26 mg/dL (H)). ------------------------------------------------------------------------------------------------------------------ No results for input(s): TSH, T4TOTAL, T3FREE, THYROIDAB in the last 72 hours.  Invalid input(s): FREET3  Coagulation profile Recent Labs  Lab 03/16/2020 1710  INR 1.3*   ------------------------------------------------------------------------------------------------------------------- No results for input(s): DDIMER in the last 72 hours. -------------------------------------------------------------------------------------------------------------------  Cardiac Enzymes No results for input(s): CKMB, TROPONINI, MYOGLOBIN in the last 168 hours.  Invalid input(s): CK ------------------------------------------------------------------------------------------------------------------    Component Value Date/Time   BNP 238.0 (H) 06/05/2019 0421     ---------------------------------------------------------------------------------------------------------------  Urinalysis    Component Value Date/Time   COLORURINE YELLOW 02/24/2020 2237   APPEARANCEUR CLEAR 02/24/2020 2237   LABSPEC 1.013 02/24/2020 2237   PHURINE 5.0 02/24/2020 2237   GLUCOSEU NEGATIVE 02/24/2020 2237   HGBUR NEGATIVE 02/24/2020 2237   BILIRUBINUR NEGATIVE 02/24/2020 2237   KETONESUR 5 (A) 02/24/2020 2237   PROTEINUR NEGATIVE 02/24/2020 2237   UROBILINOGEN 0.2 06/14/2008 1930   NITRITE NEGATIVE 02/24/2020 2237   LEUKOCYTESUR NEGATIVE 02/24/2020 2237    ----------------------------------------------------------------------------------------------------------------   Imaging  Results:    No results found.    Assessment & Plan:    Active Problems:   Hypertension   DM2 (diabetes mellitus, type 2) (HCC)   Hematochezia   Alcohol abuse   Tobacco use   Abdominal pain   HLD (hyperlipidemia)   AKI (acute kidney injury) ([place])   Hepatorenal syndrome (HCC)   Abdominal ascites   Generalized weakness   Alcoholic cirrhosis of liver with ascites (HCC)   Bleeding hemorrhoids   Acute encephalopathy   Hyperammonemia (HCC)   Hepatic encephalopathy (HCC)   Coagulopathy (HCC)   Acute renal failure (ARF) (HCC)   Low bicarbonate  Metabolic acidosis  - Alcoholic liver failure/liver cirrhosis with recurrent ascites and hyperammonemia. 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 - History of alcohol abuse and tobacco use - Hyperammonemia - Transaminitis hyperbilirubinemia - Hyponatremia - Hepatorenal syndrome - Abdominal pain due to ascites, and possible SBP - Bright red blood per rectum in the setting of hemorrhoids  - Metabolic acidosis with bicarb of 17 -Anemia of chronic kidney disease  This is a unfortunate 70 year old male with recent diagnosis of alcoholic liver cirrhosis/failure, with recurrent ascites, worsening renal function, metabolic/hepatic encephalopathy due to hyperammonemia, he was discharged to SNF on 1/13 patient was seen by GI, who recommended palliative medicine consult, palliative medicine were consulted during previous admission, son Darnelle Maffucci was assigned  by patient as next of kin, this admission his work-up significant for currently elevated ammonia level at 187, patient significantly altered and unable to take any p.o., as well with worsening renal function, creatinine of 8.26, and BUN of 83, and bicarb of 17, as well evidence of coagulopathy with INR of 1.3, as well patient with  significant ascites, abdominal pain on presentation, have discussed with son, very poor prognosis in the setting of liver disease, and worsening renal function, and now with significant encephalopathy, with recurrent ascites , and now with progressive renal failure, discussed goals of care, at this point decision has been made to proceed just with comfort measure given overall very poor prognosis and poor outcome this patient with multiorgan failure. -Comfort care order set has been initiated, he is in significant pain, I have instructed staff to give him IV Dilaudid for now, he will be on as needed Dilaudid and Ativan, with increase if he remains uncomfortable start on a drip, I have notified son he and family will be able to visit given now he is comfort care.  Addendum 10:00 PM: -Patient just received second dose of 1 mg of IV Dilaudid, still appears to be uncomfortable,he  is still moaning, so he will be started on Dilaudid drip 1 mg/hour.  DVT Prophylaxis: none as comfort AM Labs Ordered, also please review Full Orders  Family Communication: Admission, patients condition and plan of care including tests being ordered have been discussed with the patient son tyrone who indicate understanding and agree with the plan and Code Status.  Code Status DNR/Comfort  Likely DC to  : he is currently comfort care  Condition : Comfort care  Consults called: None  Admission status: inpatient, as he will require IV pain medicine control his pain and symptoms  Time spent in minutes : 55 minutes   Phillips Climes M.D on 02/21/2020 at 7:45 PM   [facility] - Office  [phone]

## 2020-03-10 NOTE — ED Notes (Signed)
Each time this Probation officer in room to check on patient noted to have hand in brief scratching anal area which currently has bright red blood present. Bilateral hand mittens placed on patient to prevent further injury.

## 2020-03-10 NOTE — ED Triage Notes (Signed)
Patient soiled on arrival. Noted with rectal bleeding secondary to hemorrhoids. Excoriation to buttocks. Incontinent care provided with warm blankets placed on patient.

## 2020-03-11 ENCOUNTER — Other Ambulatory Visit: Payer: Self-pay

## 2020-03-11 DIAGNOSIS — N179 Acute kidney failure, unspecified: Secondary | ICD-10-CM | POA: Diagnosis not present

## 2020-03-11 DIAGNOSIS — G9340 Encephalopathy, unspecified: Secondary | ICD-10-CM | POA: Diagnosis not present

## 2020-03-11 DIAGNOSIS — K7031 Alcoholic cirrhosis of liver with ascites: Secondary | ICD-10-CM | POA: Diagnosis not present

## 2020-03-11 LAB — SARS CORONAVIRUS 2 (TAT 6-24 HRS): SARS Coronavirus 2: NEGATIVE

## 2020-03-11 MED ORDER — GLYCOPYRROLATE 0.2 MG/ML IJ SOLN
0.1000 mg | INTRAMUSCULAR | Status: DC
  Administered 2020-03-11 – 2020-03-12 (×2): 0.1 mg via INTRAVENOUS
  Filled 2020-03-11 (×3): qty 1

## 2020-03-11 NOTE — TOC Initial Note (Signed)
Transition of Care [facility]) - Initial/Assessment Note    Patient Details  Name: Darren Howard MRN: [id number] Date of Birth: Feb 14, 1951  [facility]) CM/SW Contact:    Natasha Bence, LCSW Phone Number: 03/11/2020, 2:56 PM  Clinical Narrative:                 Patient is a 70 year old male admitted for Metabolic acidosis. CSW received notification of family's request for [place] referral. CSW placed referral in hub and notified Horris Latino with [facility] hospice of referral. Horris Latino reported that [place] with [facility] hospice would be able to review it on Monday. TOC to follow.  Expected Discharge Plan: [place] Barriers to Discharge: Continued Medical Work up   Patient Goals and CMS Choice Patient states their goals for this hospitalization and ongoing recovery are:: Residential Hospice CMS Medicare.gov Compare Post Acute Care list provided to:: Patient Choice offered to / list presented to : [place]  Expected Discharge Plan and Services Expected Discharge Plan: [place] In-house Referral: NA Discharge Planning Services: NA Post Acute Care Choice: NA Living arrangements for the past 2 months: Single Family Home                 DME Arranged: N/A DME Agency: NA       HH Arranged: NA HH Agency: NA        Prior Living Arrangements/Services Living arrangements for the past 2 months: Single Family Home Lives with:: [place] Patient language and need for interpreter reviewed:: Yes Do you feel safe going back to the place where you live?: Yes      Need for Family Participation in Patient Care: Yes (Comment) Care giver support system in place?: Yes (comment)   Criminal Activity/Legal Involvement Pertinent to Current Situation/Hospitalization: No - Comment as needed  Activities of Daily Living Home Assistive Devices/Equipment: None ADL Screening (condition at time of admission) Patient's cognitive ability adequate  to safely complete daily activities?: No Is the patient deaf or have difficulty hearing?: No Does the patient have difficulty seeing, even when wearing glasses/contacts?: No Does the patient have difficulty concentrating, remembering, or making decisions?: No Patient able to express need for assistance with ADLs?: No Does the patient have difficulty dressing or bathing?: Yes Independently performs ADLs?: No Communication: Dependent Is this a change from baseline?: Pre-admission baseline Dressing (OT): Dependent Is this a change from baseline?: Pre-admission baseline Grooming: Dependent Is this a change from baseline?: Pre-admission baseline Feeding: Dependent Is this a change from baseline?: Pre-admission baseline Bathing: Dependent Is this a change from baseline?: Pre-admission baseline Toileting: Dependent Is this a change from baseline?: Pre-admission baseline In/Out Bed: Dependent Is this a change from baseline?: Pre-admission baseline Walks in Home: Dependent Is this a change from baseline?: Pre-admission baseline Does the patient have difficulty walking or climbing stairs?: Yes Weakness of Legs: Both Weakness of Arms/Hands: Both  Permission Sought/Granted Permission sought to share information with : Family Supports Permission granted to share information with : Yes, Verbal Permission Granted  Share Information with NAME: Horsfall,[name]  Permission granted to share info w AGENCY: [facility]  Permission granted to share info w Relationship: Son  Permission granted to share info w Contact Information: [phone]  Emotional Assessment       Orientation: : Fluctuating Orientation (Suspected and/or reported [place]) Alcohol / Substance Use: Not Applicable Psych Involvement: No (comment)  Admission diagnosis:  Hepatic encephalopathy ([place]) [K72.90] Acute renal failure (ARF) ([place]) [N17.9] AKI (acute  kidney injury) ([place]) [N17.9] Ascites due to alcoholic cirrhosis  ([place]) [K70.31] Patient Active Problem List   Diagnosis Date Noted  . Coagulopathy ([place]) 03/03/2020  . Acute renal failure (ARF) ([place]) 02/25/2020  . Low bicarbonate 02/20/2020  . Metabolic acidosis [id number]  . Altered mental status   . Acute encephalopathy 02/25/2020  . Sepsis ([place]) 02/25/2020  . Hyperammonemia ([place]) 02/25/2020  . Hepatic encephalopathy ([place]) 02/25/2020  . Bleeding hemorrhoids   . Alcoholic cirrhosis of liver with ascites ([place])   . Abdominal ascites   . Generalized weakness   . Hepatitis   . AKI (acute kidney injury) ([place]) 01/18/2020  . Hepatorenal syndrome ([place]) 01/18/2020  . Prolonged QT interval 01/18/2020  . HLD (hyperlipidemia) 08/11/2019  . Abdominal pain   . Colitis, acute---??? infectious pancolitis 06/13/2019  . Urinary retention   . Diarrhea   . Abnormal CT scan, colon   . Pyelonephritis of right kidney 06/05/2019  . Pyelonephritis 06/05/2019  . Alcohol abuse   . Tobacco use   . Hematochezia 05/30/2018  . Hypokalemia 05/27/2018  . Hypomagnesemia 05/27/2018  . Hypocalcemia 05/27/2018  . Dizziness 05/13/2018  . Epistaxis 02/13/2014  . Hypertension 02/13/2014  . Normocytic anemia 02/13/2014  . DM2 (diabetes mellitus, type 2) ([place]) 02/13/2014  . Tachycardia 02/13/2014  . Hypertensive urgency    PCP:  [name], MD Pharmacy:   [facility], [address]. [place] [address] Phone: [phone] Fax: [phone]     Social Determinants of Health (SDOH) Interventions    Readmission Risk Interventions Readmission Risk Prevention Plan 02/27/2020 01/26/2020 01/23/2020  Post Dischage Appt - - -  Medication Screening - - -  Transportation Screening Complete Complete Complete  PCP or Specialist Appt within 5-7 Days - - -  PCP or Specialist Appt within 3-5 Days - Complete Complete  Home Care Screening - - -  Medication Review (RN CM) - - -  [place]  or Home Care Consult Complete Complete Complete  Social Work Consult for [place] Planning/Counseling Complete Complete Complete  Palliative Care Screening Not Applicable Not Applicable Not Applicable  Medication Review Press photographer) Complete Complete Complete  Some recent data might be hidden

## 2020-03-11 NOTE — Progress Notes (Signed)
Son and daughter-in-law present with Darren Howard as I offered support. They were expressive about their acceptance of his decline. We prayed about that acceptance of God's will for him which they stated they were holding onto.

## 2020-03-11 NOTE — Progress Notes (Signed)
PROGRESS NOTE    Darren Howard  [id number] DOB: [birth year] DOA: 03/19/2020 PCP: [name], MD    Brief Narrative:  70 year old male with a history of liver cirrhosis, admitted to the hospital with acute renal failure, hepatic encephalopathy.  Overall prognosis felt to be poor and he was transitioned to comfort measures. Currently on dilaudid infusion   Assessment & Plan:   Active Problems:   Hypertension   DM2 (diabetes mellitus, type 2) (HCC)   Hematochezia   Alcohol abuse   Tobacco use   Abdominal pain   HLD (hyperlipidemia)   AKI (acute kidney injury) ([place])   Hepatorenal syndrome (HCC)   Abdominal ascites   Generalized weakness   Alcoholic cirrhosis of liver with ascites (HCC)   Bleeding hemorrhoids   Acute encephalopathy   Hyperammonemia (HCC)   Hepatic encephalopathy (HCC)   Coagulopathy (HCC)   Acute renal failure (ARF) (HCC)   Low bicarbonate   Metabolic acidosis   70 year old male with history of alcoholic liver cirrhosis, recurrent ascites, acute kidney injury, hepatic encephalopathy, was admitted to the hospital with worsening mental status, elevated ammonia, and worsening renal function with a creatinine of 8.26.  After discussion with the patient's son, it was decided to focus his care on quality of life and comfort.  The patient was started on a Dilaudid infusion for significant pain.  He also has Ativan as needed.  He appears comfortable at this time.  At this point, prognosis appears to be hours.  We will continue current treatments.  Consider hospice referral.   DVT prophylaxis: None, comfort measures  Code Status: DNR, comfort measures Family Communication: Discussed with son and daughter-in-law at the bedside Disposition Plan: Status is: Inpatient  Remains inpatient appropriate because:IV treatments appropriate due to intensity of illness or inability to take PO and Inpatient level of care appropriate due to severity of illness   Dispo: The  patient is from: Home              Anticipated d/c is to: Residential hospice versus in-hospital death              Anticipated d/c date is: 1 day              Patient currently is not medically stable to d/c.   Difficult to place patient No     Subjective: Unresponsive  Objective: Vitals:   03/11/20 0300 03/11/20 0400 03/11/20 0544 03/11/20 1418  BP:  (!) 125/96 119/89 98/61  Pulse: (!) 102 (!) 106 (!) 111 (!) 120  Resp: '11 12 16 '$ (!) 6  Temp:   98.7 F (37.1 C) (!) 97.5 F (36.4 C)  TempSrc:   Oral Oral  SpO2: 98% 99% 100% 95%  Weight:      Height:        Intake/Output Summary (Last 24 hours) at 03/11/2020 1907 Last data filed at 03/11/2020 1600 Gross per 24 hour  Intake 33.24 ml  Output 200 ml  Net -166.76 ml   Filed Weights   02/18/2020 1655  Weight: 72.6 kg    Examination:  General exam: Unresponsive Respiratory system: Bilateral rhonchi.  Agonal breaths Cardiovascular system: S1 & S2 heard, RRR. No JVD, murmurs, rubs, gallops or clicks. No pedal edema.  Data Reviewed: I have personally reviewed following labs and imaging studies  CBC: Recent Labs  Lab 03/09/2020 1710  WBC 9.2  NEUTROABS 5.0  HGB 10.1*  HCT 30.2*  MCV 93.8  PLT 0000000   Basic Metabolic Panel:  Recent Labs  Lab 03/06/2020 1710  NA 132*  K 4.7  CL 102  CO2 17*  GLUCOSE 114*  BUN 83*  CREATININE 8.26*  CALCIUM 8.2*   GFR: Estimated Creatinine Clearance: 8.9 mL/min (A) (by C-G formula based on SCr of 8.26 mg/dL (H)). Liver Function Tests: Recent Labs  Lab 02/21/2020 1710  AST 44*  ALT 22  ALKPHOS 85  BILITOT 2.5*  PROT 6.8  ALBUMIN 2.3*   No results for input(s): LIPASE, AMYLASE in the last 168 hours. Recent Labs  Lab 02/22/2020 1710  AMMONIA 187*   Coagulation Profile: Recent Labs  Lab 02/26/2020 1710  INR 1.3*   Cardiac Enzymes: No results for input(s): CKTOTAL, CKMB, CKMBINDEX, TROPONINI in the last 168 hours. BNP (last 3 results) No results for input(s): PROBNP in  the last 8760 hours. HbA1C: No results for input(s): HGBA1C in the last 72 hours. CBG: No results for input(s): GLUCAP in the last 168 hours. Lipid Profile: No results for input(s): CHOL, HDL, LDLCALC, TRIG, CHOLHDL, LDLDIRECT in the last 72 hours. Thyroid Function Tests: No results for input(s): TSH, T4TOTAL, FREET4, T3FREE, THYROIDAB in the last 72 hours. Anemia Panel: No results for input(s): VITAMINB12, FOLATE, FERRITIN, TIBC, IRON, RETICCTPCT in the last 72 hours. Sepsis Labs: No results for input(s): PROCALCITON, LATICACIDVEN in the last 168 hours.  Recent Results (from the past 240 hour(s))  SARS CORONAVIRUS 2 (TAT 6-24 HRS) Nasopharyngeal Nasopharyngeal Swab     Status: None   Collection Time: 03/17/2020  6:31 PM   Specimen: Nasopharyngeal Swab  Result Value Ref Range Status   SARS Coronavirus 2 NEGATIVE NEGATIVE Final    Comment: (NOTE) SARS-CoV-2 target nucleic acids are NOT DETECTED.  The SARS-CoV-2 RNA is generally detectable in upper and lower respiratory specimens during the acute phase of infection. Negative results do not preclude SARS-CoV-2 infection, do not rule out co-infections with other pathogens, and should not be used as the sole basis for treatment or other patient management decisions. Negative results must be combined with clinical observations, patient history, and epidemiological information. The expected result is Negative.  Fact Sheet for Patients: SugarRoll.be  Fact Sheet for Healthcare Providers: https://www.woods-mathews.com/  This test is not yet approved or cleared by the [place] FDA and  has been authorized for detection and/or diagnosis of SARS-CoV-2 by FDA under an Emergency Use Authorization (EUA). This EUA will remain  in effect (meaning this test can be used) for the duration of the COVID-19 declaration under Se ction 564(b)(1) of the Act, 21 U.S.C. section 360bbb-3(b)(1), unless the  authorization is terminated or revoked sooner.  Performed at [facility], [place] [address], [place], [place] [address]          Radiology Studies: No results found.      Scheduled Meds: . lactulose  30 g Oral Once   Continuous Infusions: . HYDROmorphone 1 mg/hr (03/11/20 1834)     LOS: 1 day    Time spent: 39mns    JKathie Dike MD [facility]   If 7PM-7AM, please contact night-coverage [email or web address]  03/11/2020, 7:07 PM

## 2020-03-11 NOTE — Progress Notes (Signed)
[facility] night shift.  The staff requested an order for glycopyrrolate for secretion management.  The patient is currently on comfort care measures due to liver cirrhosis with hyperammonemia and hepatic encephalopathy, AKI with a creatinine of 8.26 mg/dL.  Glycopyrrolate 0.1 every 4 hours ordered.  Tennis Must, MD

## 2020-03-20 NOTE — Telephone Encounter (Signed)
Faxed to [place]

## 2020-03-20 NOTE — Progress Notes (Signed)
Patient has expired. Time of death is 0429 am. Patient had both of his son's present at the bedside. Verified by this nurse and Grace Isaac, LPN. Dr.Ortiz has been notified and [employer] has been called. Was instructed to prep eyes via [place] at [facility].

## 2020-03-20 NOTE — Death Summary Note (Signed)
DEATH SUMMARY   Patient Details  Name: Darren Howard MRN: [id number] DOB: Aug 04, 1950  Admission/Discharge Information   Admit Date:  03-22-20  Date of Death: Date of Death: 03-24-20  Time of Death: Time of Death: 0429  Length of Stay: 2  Referring Physician: [name], MD   Reason(s) for Hospitalization  Worsening mental status, abdominal discomfort and bright red blood per rectum  Diagnoses  Preliminary cause of death: Hepatic encephalopathy and acute renal failure Secondary Diagnoses (including complications and co-morbidities):  Active Problems:   Hypertension   DM2 (diabetes mellitus, type 2) (HCC)   Hematochezia   Alcohol abuse   Tobacco use   Abdominal pain   HLD (hyperlipidemia)   AKI (acute kidney injury) ([place])   Hepatorenal syndrome (HCC)   Abdominal ascites   Generalized weakness   Alcoholic cirrhosis of liver with ascites (HCC)   Bleeding hemorrhoids   Acute encephalopathy   Hyperammonemia (HCC)   Hepatic encephalopathy (HCC)   Coagulopathy (HCC)   Acute renal failure (ARF) (HCC)   Low bicarbonate   Metabolic acidosis   Brief Hospital Course (including significant findings, care, treatment, and services provided and events leading to death)  Darren Howard is a 70 y.o. year old male with a history of alcoholic liver cirrhosis, recurrent ascites, acute kidney injury, hepatic encephalopathy, was admitted to the hospital with worsening mental status, elevated ammonia and worsening renal function.  His creatinine on admission was noted to be 8.26.  There was concern for hepatorenal syndrome.  Patient had distended abdomen frequently required large-volume paracentesis.  During his last admission, he was seen by gastroenterology who recommended palliative care/hospice.  Due to the patient's poor prognosis, goals of care discussion was had with his son.  Decision was made to focus on quality of life and comfort.  Patient was started on Dilaudid infusion for  significant abdominal discomfort.  He was admitted to the hospital for comfort measures.  On 24-Mar-2022 patient was noted to be without heart sounds or spontaneous respirations.  He was pronounced dead at that time.  Family was at the bedside and was provided support.    Pertinent Labs and Studies  Significant Diagnostic Studies CT Abdomen Pelvis Wo Contrast  Result Date: 02/24/2020 CLINICAL DATA:  70 year old male with altered mental status. Abdominal distension. EXAM: CT ABDOMEN AND PELVIS WITHOUT CONTRAST TECHNIQUE: Multidetector CT imaging of the abdomen and pelvis was performed following the standard protocol without IV contrast. COMPARISON:  CT of the pelvis dated 01/18/2020. FINDINGS: Evaluation of this exam is limited in the absence of intravenous contrast. Evaluation is also limited due to streak artifact caused by support wires. Lower chest: The visualized lung bases are clear. No intra-abdominal free air. Moderate ascites. Hepatobiliary: The liver is grossly unremarkable. No calcified gallstone. Pancreas: The pancreas is atrophic Spleen: Normal in size without focal abnormality. Adrenals/Urinary Tract: The adrenal glands unremarkable. There is no hydronephrosis or nephrolithiasis on either side. Right renal cysts measure up to 3 cm in the inferior pole the right kidney. The urinary bladder is minimally distended and grossly unremarkable Stomach/Bowel: Evaluation of the bowel is limited in the absence of oral contrast and due to ascites. There is no bowel obstruction. Vascular/Lymphatic: Mild aortoiliac atherosclerotic disease. The IVC is unremarkable. No portal venous gas. There is no adenopathy. Reproductive: The prostate and seminal vesicles are grossly unremarkable. Other: None Musculoskeletal: Degenerative changes of the spine. No acute osseous pathology. There is extension of ascitic fluid into the inguinal canals bilaterally. IMPRESSION:  1. Moderate ascites. 2. No hydronephrosis or nephrolithiasis.  3. No evidence of bowel obstruction. 4. Aortic Atherosclerosis (ICD10-I70.0). Electronically Signed   By: Anner Crete M.D.   On: 02/24/2020 23:35   CT Head Wo Contrast  Result Date: 02/24/2020 CLINICAL DATA:  70 year old male with altered mental status. EXAM: CT HEAD WITHOUT CONTRAST TECHNIQUE: Contiguous axial images were obtained from the base of the skull through the vertex without intravenous contrast. COMPARISON:  Head CT dated 05/13/2018. FINDINGS: Evaluation of this exam is limited due to motion artifact. Brain: Mild age-related atrophy and chronic microvascular ischemic changes. Old bilateral basal ganglia lacunar infarcts noted. There is no acute intracranial hemorrhage. No mass effect midline shift no extra-axial fluid collection. Vascular: No hyperdense vessel or unexpected calcification. Skull: Normal. Negative for fracture or focal lesion. Sinuses/Orbits: The visualized paranasal sinuses are clear. Right mastoid effusion. The left mastoid air cells are clear. Cerumen noted in the right external auditory canal. Other: None IMPRESSION: 1. No acute intracranial pathology. 2. Age-related atrophy and chronic microvascular ischemic changes. Old bilateral basal ganglia lacunar infarcts. Electronically Signed   By: Anner Crete M.D.   On: 02/24/2020 21:14   US RENAL  Result Date: 02/27/2020 CLINICAL DATA:  Inpatient.  Chronic kidney disease. EXAM: RENAL / URINARY TRACT ULTRASOUND COMPLETE COMPARISON:  02/24/2020 CT abdomen/pelvis. FINDINGS: Right Kidney: Renal measurements: 11.3 x 4.8 x 5.3 cm = volume: 150 mL. Echogenic right renal parenchyma, normal thickness. No hydronephrosis. Simple 2.7 x 2.4 x 2.2 cm upper right renal cyst. Simple 3.4 x 3.3 x 4.0 cm lower right renal cyst. Left Kidney: Renal measurements: 10.1 x 4.6 x 4.6 cm = volume: 112 mL. Echogenic left renal parenchyma, normal thickness. No hydronephrosis. No left renal mass. Bladder: Appears normal for degree of bladder distention.  Other: Large volume ascites incidentally noted. IMPRESSION: 1. No hydronephrosis. 2. Echogenic normal size kidneys, compatible with reported nonspecific chronic renal parenchymal disease. 3. Simple right renal cysts. 4. Normal bladder. 5. Incidental large volume ascites. Electronically Signed   By: Ilona Sorrel M.D.   On: 02/27/2020 10:52   US Paracentesis  Result Date: 03/01/2020 INDICATION: Cirrhosis, ascites EXAM: ULTRASOUND GUIDED THERAPEUTIC PARACENTESIS MEDICATIONS: None COMPLICATIONS: None immediate PROCEDURE: Informed written consent was obtained from the patient after a discussion of the risks, benefits and alternatives to treatment. A timeout was performed prior to the initiation of the procedure. Initial ultrasound scanning demonstrates a large amount of ascites within the LEFT lower abdominal quadrant. The right lower abdomen was prepped and draped in the usual sterile fashion. 1% lidocaine was used for local anesthesia. Following this, a 5 [place] Yueh catheter was introduced. An ultrasound image was saved for documentation purposes. The paracentesis was performed. The catheter was removed and a dressing was applied. The patient tolerated the procedure well without immediate post procedural complication. FINDINGS: A total of approximately 4 L of yellow ascitic fluid was removed. IMPRESSION: Successful ultrasound-guided paracentesis yielding 4 liters of peritoneal fluid. Electronically Signed   By: Lavonia Dana M.D.   On: 03/01/2020 13:25   US Paracentesis  Result Date: 02/27/2020 INDICATION: Cirrhosis, ascites EXAM: ULTRASOUND GUIDED DIAGNOSTIC AND THERAPEUTIC PARACENTESIS MEDICATIONS: None COMPLICATIONS: None immediate PROCEDURE: Informed written consent was obtained from the patient after a discussion of the risks, benefits and alternatives to treatment. A timeout was performed prior to the initiation of the procedure. Initial ultrasound scanning demonstrates a large amount of ascites within the  right lower abdominal quadrant. The right lower abdomen was prepped and draped  in the usual sterile fashion. 1% lidocaine was used for local anesthesia. Following this, a 5 [place] Yueh catheter was introduced. An ultrasound image was saved for documentation purposes. The paracentesis was performed. The catheter was removed and a dressing was applied. The patient tolerated the procedure well without immediate post procedural complication. Patient received post-procedure intravenous albumin; see nursing notes for details. FINDINGS: A total of approximately 6.2 L of yellow ascitic fluid was removed. Samples were sent to the laboratory as requested by the clinical team. IMPRESSION: Successful ultrasound-guided paracentesis yielding 6.2 liters of peritoneal fluid. Electronically Signed   By: Lavonia Dana M.D.   On: 02/27/2020 11:40   DG Chest Portable 1 View  Result Date: 02/27/2020 CLINICAL DATA:  Placement EXAM: PORTABLE CHEST 1 VIEW COMPARISON:  02/24/2020 FINDINGS: The heart size and mediastinal contours are within normal limits. Interval placement of right neck vascular catheter, tip projecting near the superior cavoatrial junction. Both lungs are clear. The visualized skeletal structures are unremarkable. IMPRESSION: Interval placement of right neck vascular catheter, tip projecting near the superior cavoatrial junction. Electronically Signed   By: Eddie Candle M.D.   On: 02/27/2020 12:52   DG Chest Port 1 View  Result Date: 02/24/2020 CLINICAL DATA:  Altered mental status. EXAM: PORTABLE CHEST 1 VIEW COMPARISON:  01/18/2020 FINDINGS: The heart size and mediastinal contours are within normal limits. Both lungs are clear. The visualized skeletal structures are unremarkable. IMPRESSION: No active disease. Electronically Signed   By: Marlaine Hind M.D.   On: 02/24/2020 19:41    Microbiology Recent Results (from the past 240 hour(s))  SARS CORONAVIRUS 2 (TAT 6-24 HRS) Nasopharyngeal Nasopharyngeal Swab      Status: None   Collection Time: 03/06/2020  6:31 PM   Specimen: Nasopharyngeal Swab  Result Value Ref Range Status   SARS Coronavirus 2 NEGATIVE NEGATIVE Final    Comment: (NOTE) SARS-CoV-2 target nucleic acids are NOT DETECTED.  The SARS-CoV-2 RNA is generally detectable in upper and lower respiratory specimens during the acute phase of infection. Negative results do not preclude SARS-CoV-2 infection, do not rule out co-infections with other pathogens, and should not be used as the sole basis for treatment or other patient management decisions. Negative results must be combined with clinical observations, patient history, and epidemiological information. The expected result is Negative.  Fact Sheet for Patients: SugarRoll.be  Fact Sheet for Healthcare Providers: https://www.woods-mathews.com/  This test is not yet approved or cleared by the [place] FDA and  has been authorized for detection and/or diagnosis of SARS-CoV-2 by FDA under an Emergency Use Authorization (EUA). This EUA will remain  in effect (meaning this test can be used) for the duration of the COVID-19 declaration under Se ction 564(b)(1) of the Act, 21 U.S.C. section 360bbb-3(b)(1), unless the authorization is terminated or revoked sooner.  Performed at [facility], [place] [address], [place], [place] [address]     Lab Basic Metabolic Panel: Recent Labs  Lab 03/08/2020 1710  NA 132*  K 4.7  CL 102  CO2 17*  GLUCOSE 114*  BUN 83*  CREATININE 8.26*  CALCIUM 8.2*   Liver Function Tests: Recent Labs  Lab 02/25/2020 1710  AST 44*  ALT 22  ALKPHOS 85  BILITOT 2.5*  PROT 6.8  ALBUMIN 2.3*   No results for input(s): LIPASE, AMYLASE in the last 168 hours. Recent Labs  Lab 02/29/2020 1710  AMMONIA 187*   CBC: Recent Labs  Lab 03/05/2020 1710  WBC 9.2  NEUTROABS  5.0  HGB 10.1*  HCT 30.2*  MCV 93.8  PLT 176   Cardiac Enzymes: No results for  input(s): CKTOTAL, CKMB, CKMBINDEX, TROPONINI in the last 168 hours. Sepsis Labs: Recent Labs  Lab 03/15/2020 1710  WBC 9.2    Procedures/Operations     [employer] 2020-03-30, 8:43 PM

## 2020-03-20 DEATH — deceased

## 2021-03-08 IMAGING — CT CT ABD-PELV W/ CM
2 of 5 series · 16 of 46 positions shown, 18 images · IV contrast (Omnipaque or Isovue)
Comparison: June 04, 2019.

CLINICAL DATA: Diarrhea.

EXAM:
CT ABDOMEN AND PELVIS WITH CONTRAST
TECHNIQUE: Multidetector CT imaging of the abdomen and pelvis was performed
using the standard protocol following bolus administration of
intravenous contrast.
CONTRAST:  100mL OMNIPAQUE IOHEXOL 300 MG/ML  SOLN

[Series 2: axial st · axial · 0.98mm/px · z∈[-433,+2]mm · 13 of 101 slices shown, 15 images]
[im 7/101  soft-tissue]
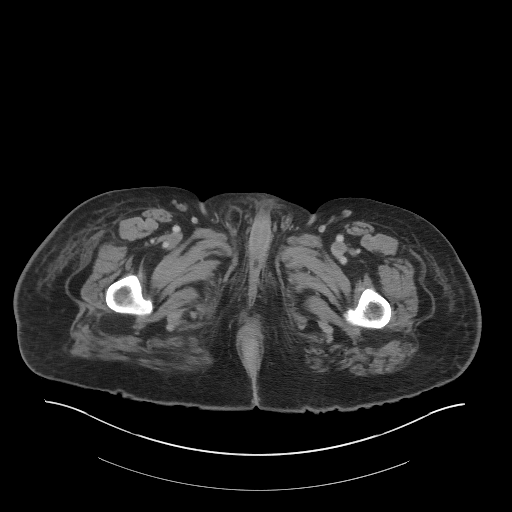
[im 7/101  bone]
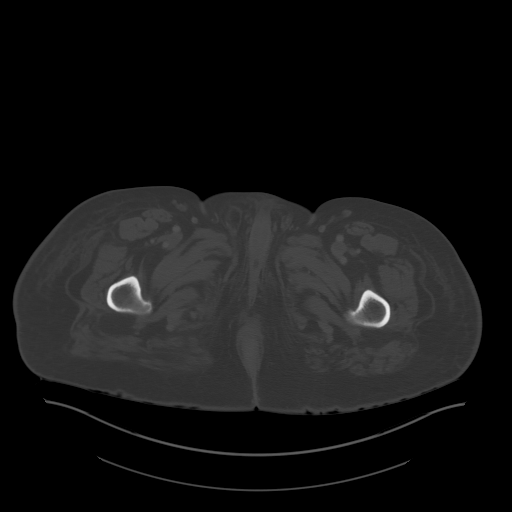
[im 13/101  soft-tissue]
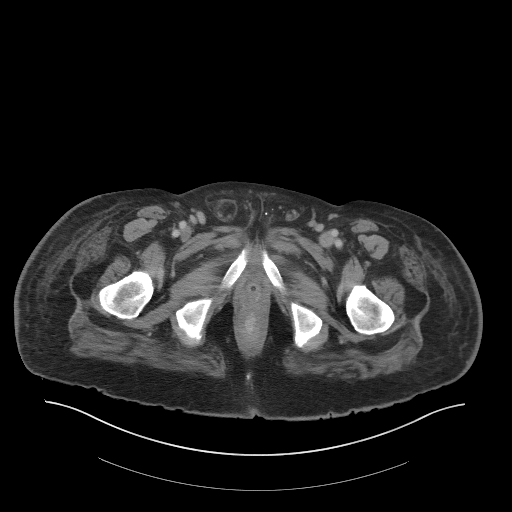
[im 19/101  soft-tissue]
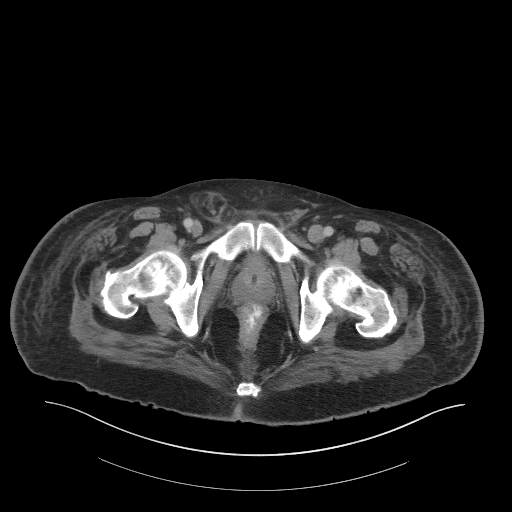
[im 32/101  soft-tissue]
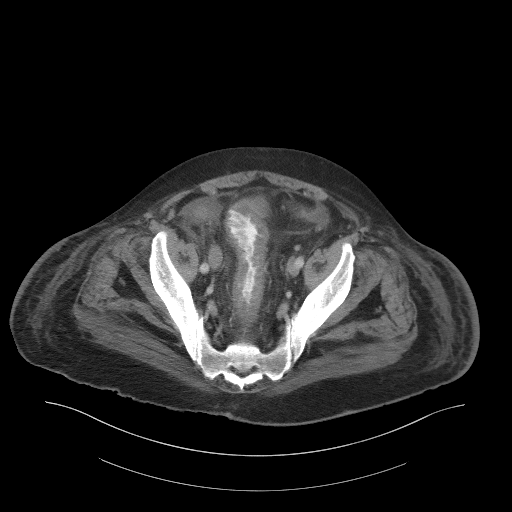
[im 38/101  soft-tissue]
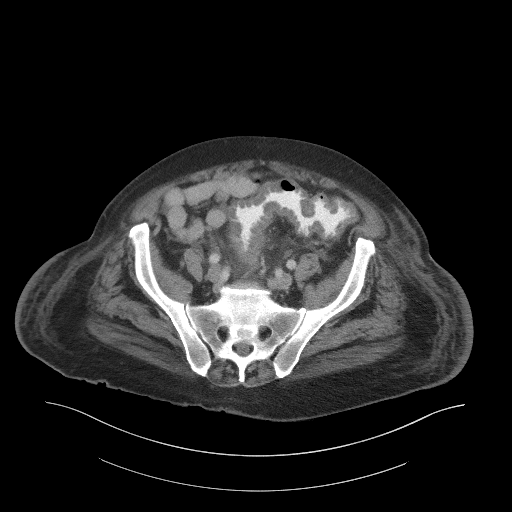
[im 44/101  soft-tissue]
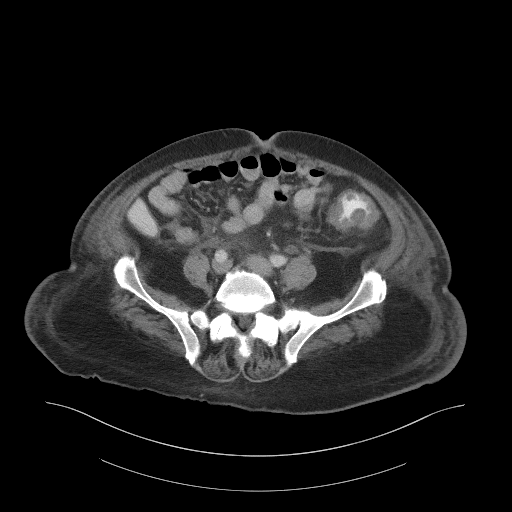
[im 51/101  soft-tissue]
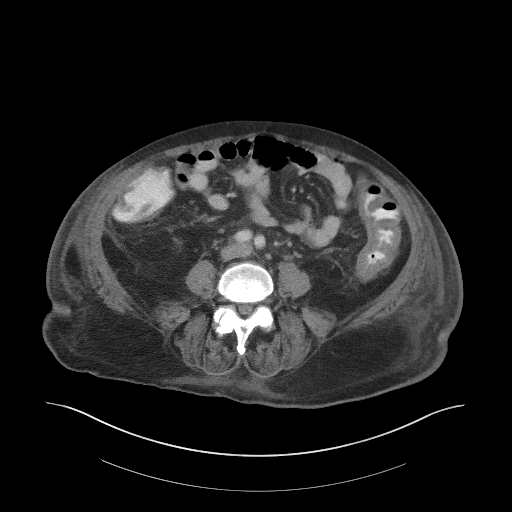
[im 57/101  soft-tissue]
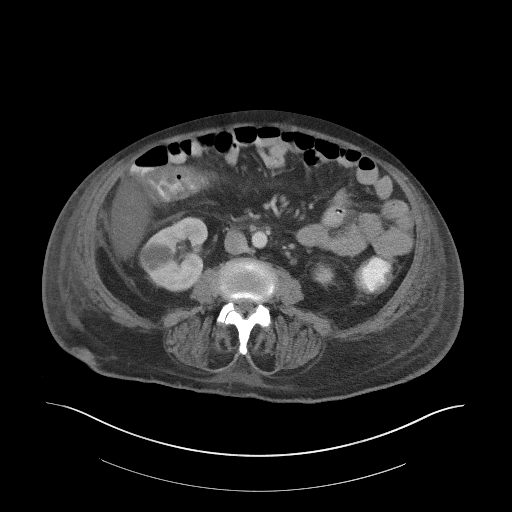
[im 63/101  soft-tissue]
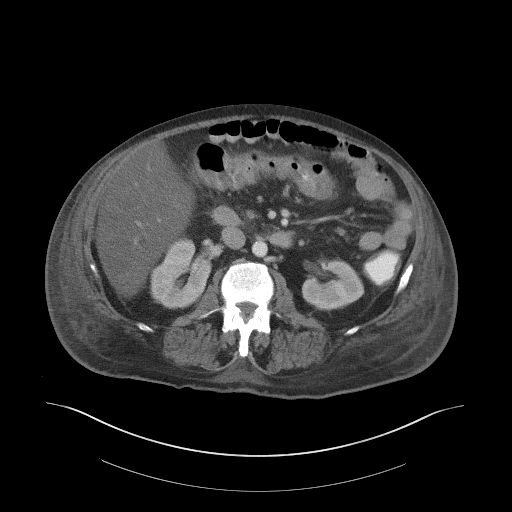
[im 63/101  bone]
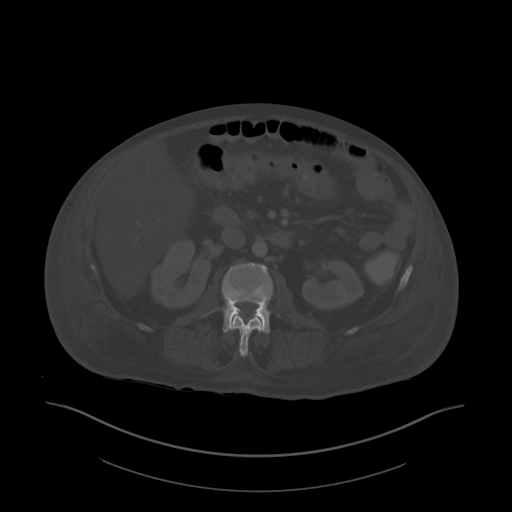
[im 69/101  soft-tissue]
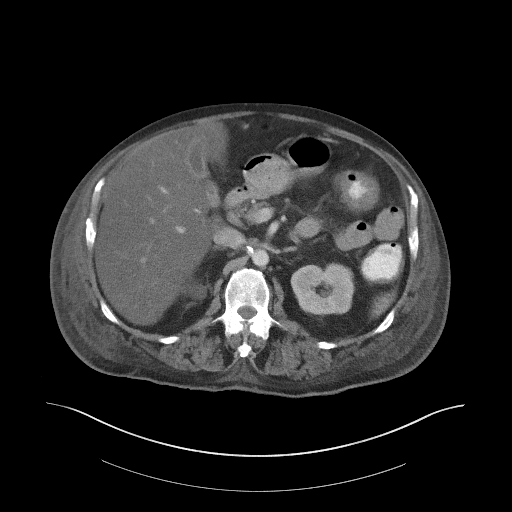
[im 82/101  soft-tissue]
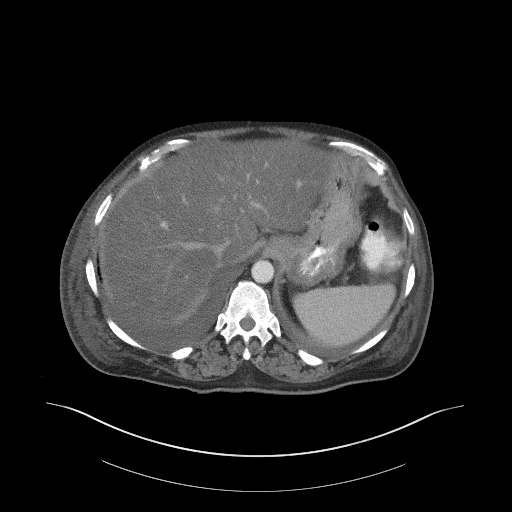
[im 88/101  soft-tissue]
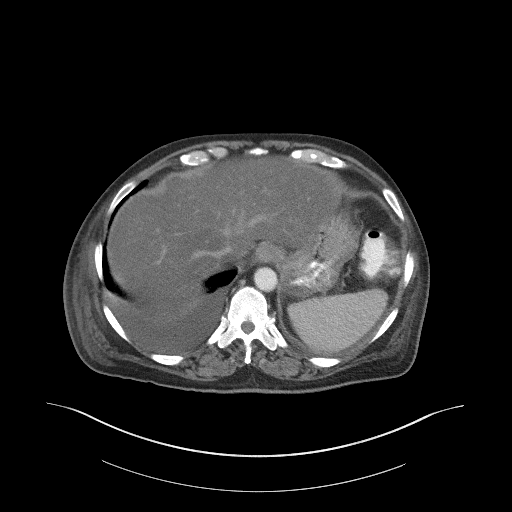
[im 94/101  soft-tissue]
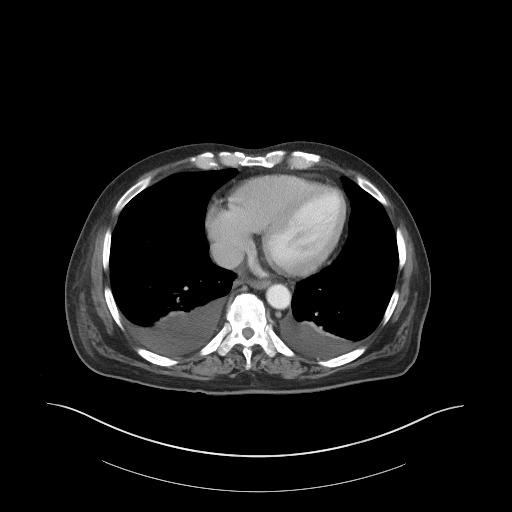

[Series 5: coronal st · coronal · 0.87mm/px · 3 of 117 slices shown]
[im 39/117  soft-tissue]
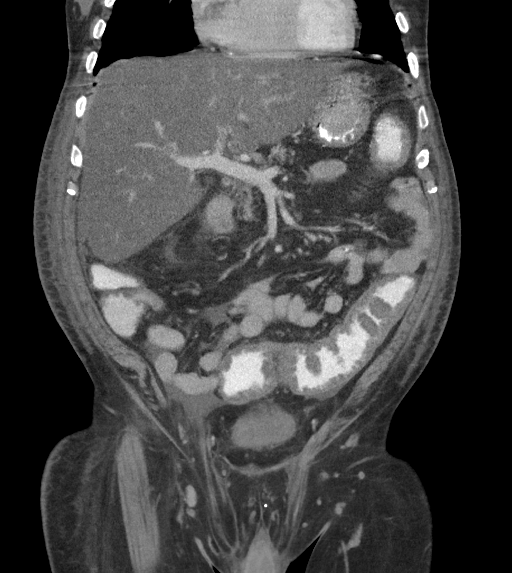
[im 52/117  soft-tissue]
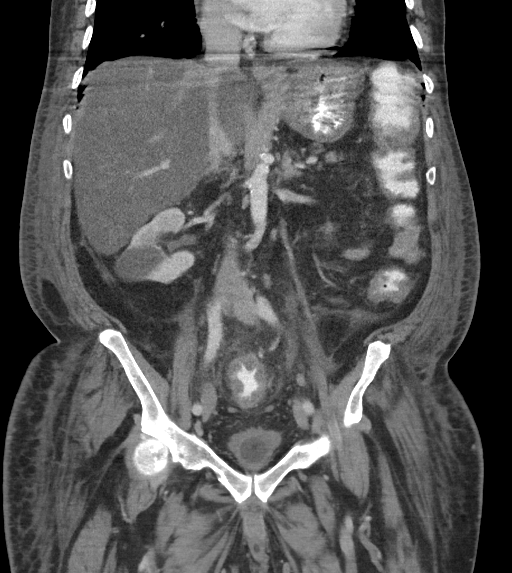
[im 65/117  soft-tissue]
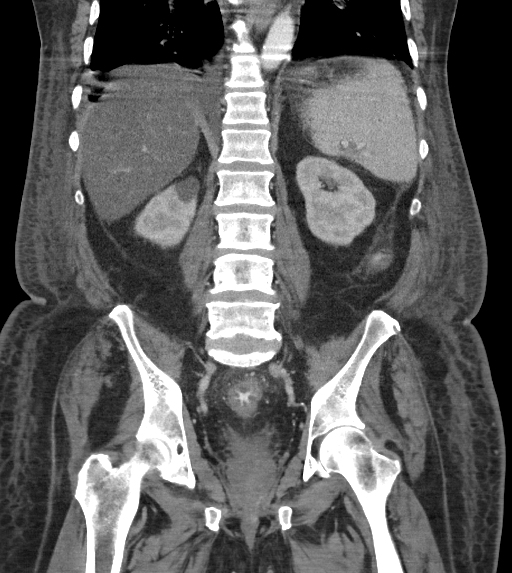

[16 of 46 positions shown; findings below may reference images not displayed]

FINDINGS: Lower chest: Mild bilateral pleural effusions are noted with
adjacent subsegmental atelectasis.

Hepatobiliary: Hepatic steatosis is noted. No gallstones are noted.
No biliary dilatation is noted.

Pancreas: Unremarkable. No pancreatic ductal dilatation or
surrounding inflammatory changes.

Spleen: Normal in size without focal abnormality.

Adrenals/Urinary Tract: Adrenal glands appear normal. Right renal
cysts are noted. No renal or ureteral calculi are noted. Minimal
left hydroureteronephrosis is noted without obstructing calculus.
Urinary bladder is decompressed secondary to Foley catheter.

Stomach/Bowel: The stomach appears normal. The appendix is not
visualized. Moderate to severe wall thickening is seen involving the
transverse, descending and sigmoid colon and rectum concerning for
inflammatory or infectious colitis. There is no evidence of bowel
obstruction.

Vascular/Lymphatic: No significant vascular findings are present. No
enlarged abdominal or pelvic lymph nodes.

Reproductive: Prostate is unremarkable.

Other: No abdominal wall hernia or abnormality. No abdominopelvic
ascites.

Musculoskeletal: No acute or significant osseous findings.
IMPRESSION: 1. Hepatic steatosis.
2. Mild bilateral pleural effusions are noted with adjacent
subsegmental atelectasis.
3. Minimal left hydroureteronephrosis is noted without obstructing
calculus.
4. Moderate to severe wall thickening is seen involving the
transverse, descending and sigmoid colon and rectum concerning for
inflammatory or infectious colitis.
